# Patient Record
Sex: Female | Born: 1935 | Race: White | Hispanic: No | Smoking: Never smoker
Health system: Southern US, Community
[De-identification: ages and names within clinical notes are randomized; demographics above are authoritative.]

## PROBLEM LIST (undated history)

## (undated) DIAGNOSIS — K219 Gastro-esophageal reflux disease without esophagitis: Secondary | ICD-10-CM

## (undated) DIAGNOSIS — I639 Cerebral infarction, unspecified: Secondary | ICD-10-CM

## (undated) DIAGNOSIS — E119 Type 2 diabetes mellitus without complications: Secondary | ICD-10-CM

## (undated) DIAGNOSIS — I10 Essential (primary) hypertension: Secondary | ICD-10-CM

## (undated) DIAGNOSIS — I2510 Atherosclerotic heart disease of native coronary artery without angina pectoris: Secondary | ICD-10-CM

## (undated) DIAGNOSIS — N186 End stage renal disease: Secondary | ICD-10-CM

## (undated) DIAGNOSIS — E7800 Pure hypercholesterolemia, unspecified: Secondary | ICD-10-CM

## (undated) DIAGNOSIS — D638 Anemia in other chronic diseases classified elsewhere: Secondary | ICD-10-CM

## (undated) DIAGNOSIS — I421 Obstructive hypertrophic cardiomyopathy: Secondary | ICD-10-CM

## (undated) DIAGNOSIS — E162 Hypoglycemia, unspecified: Secondary | ICD-10-CM

## (undated) HISTORY — PX: BACK SURGERY: SHX140

## (undated) HISTORY — DX: Atherosclerotic heart disease of native coronary artery without angina pectoris: I25.10

## (undated) HISTORY — DX: Cerebral infarction, unspecified: I63.9

## (undated) HISTORY — PX: FRACTURE SURGERY: SHX138

## (undated) HISTORY — PX: TOTAL KNEE ARTHROPLASTY: SHX125

## (undated) HISTORY — DX: Pure hypercholesterolemia, unspecified: E78.00

## (undated) HISTORY — PX: APPENDECTOMY: SHX54

---

## 2005-04-30 ENCOUNTER — Inpatient Hospital Stay (HOSPITAL_COMMUNITY): Admission: RE | Admit: 2005-04-30 | Discharge: 2005-05-05 | Payer: Self-pay | Admitting: Orthopedic Surgery

## 2005-05-05 ENCOUNTER — Ambulatory Visit: Payer: Self-pay | Admitting: Physical Medicine & Rehabilitation

## 2005-05-05 ENCOUNTER — Inpatient Hospital Stay
Admission: RE | Admit: 2005-05-05 | Discharge: 2005-05-10 | Payer: Self-pay | Admitting: Physical Medicine & Rehabilitation

## 2006-11-28 ENCOUNTER — Ambulatory Visit: Payer: Self-pay | Admitting: Physical Medicine & Rehabilitation

## 2006-11-30 ENCOUNTER — Ambulatory Visit: Admission: RE | Admit: 2006-11-30 | Discharge: 2006-11-30 | Payer: Self-pay | Admitting: Orthopedic Surgery

## 2006-12-08 ENCOUNTER — Ambulatory Visit: Payer: Self-pay | Admitting: Cardiology

## 2006-12-08 LAB — CONVERTED CEMR LAB
Basophils Relative: 0.5 % (ref 0.0–1.0)
CO2: 25 meq/L (ref 19–32)
Chloride: 109 meq/L (ref 96–112)
Creatinine, Ser: 2 mg/dL — ABNORMAL HIGH (ref 0.4–1.2)
GFR calc non Af Amer: 26 mL/min
Glucose, Bld: 81 mg/dL (ref 70–99)
HCT: 30.9 % — ABNORMAL LOW (ref 36.0–46.0)
INR: 1 (ref 0.9–2.0)
Lymphocytes Relative: 29.9 % (ref 12.0–46.0)
Monocytes Absolute: 0.6 10*3/uL (ref 0.2–0.7)
Monocytes Relative: 6.7 % (ref 3.0–11.0)
Neutrophils Relative %: 60.3 % (ref 43.0–77.0)
Platelets: 256 10*3/uL (ref 150–400)
Prothrombin Time: 12 s (ref 10.0–14.0)
RDW: 13 % (ref 11.5–14.6)
WBC: 8.5 10*3/uL (ref 4.5–10.5)
aPTT: 34.5 s (ref 26.5–36.5)

## 2006-12-16 ENCOUNTER — Inpatient Hospital Stay (HOSPITAL_COMMUNITY): Admission: AD | Admit: 2006-12-16 | Discharge: 2006-12-18 | Payer: Self-pay | Admitting: Cardiology

## 2006-12-16 ENCOUNTER — Ambulatory Visit: Payer: Self-pay | Admitting: Cardiology

## 2007-01-15 ENCOUNTER — Ambulatory Visit: Payer: Self-pay | Admitting: Cardiology

## 2007-01-27 ENCOUNTER — Inpatient Hospital Stay (HOSPITAL_COMMUNITY): Admission: RE | Admit: 2007-01-27 | Discharge: 2007-02-01 | Payer: Self-pay | Admitting: Orthopedic Surgery

## 2009-02-13 ENCOUNTER — Encounter: Admission: RE | Admit: 2009-02-13 | Discharge: 2009-02-13 | Payer: Self-pay | Admitting: Orthopedic Surgery

## 2009-03-19 DIAGNOSIS — I10 Essential (primary) hypertension: Secondary | ICD-10-CM

## 2009-03-19 DIAGNOSIS — E1165 Type 2 diabetes mellitus with hyperglycemia: Secondary | ICD-10-CM

## 2009-03-19 DIAGNOSIS — D539 Nutritional anemia, unspecified: Secondary | ICD-10-CM

## 2009-03-19 DIAGNOSIS — E7800 Pure hypercholesterolemia, unspecified: Secondary | ICD-10-CM

## 2009-03-19 DIAGNOSIS — IMO0002 Reserved for concepts with insufficient information to code with codable children: Secondary | ICD-10-CM | POA: Insufficient documentation

## 2009-03-19 DIAGNOSIS — N259 Disorder resulting from impaired renal tubular function, unspecified: Secondary | ICD-10-CM | POA: Insufficient documentation

## 2009-03-19 DIAGNOSIS — E1129 Type 2 diabetes mellitus with other diabetic kidney complication: Secondary | ICD-10-CM

## 2009-03-20 ENCOUNTER — Ambulatory Visit: Payer: Self-pay | Admitting: Cardiology

## 2009-03-20 DIAGNOSIS — R9431 Abnormal electrocardiogram [ECG] [EKG]: Secondary | ICD-10-CM | POA: Insufficient documentation

## 2009-03-20 DIAGNOSIS — E669 Obesity, unspecified: Secondary | ICD-10-CM

## 2009-03-29 ENCOUNTER — Telehealth: Payer: Self-pay | Admitting: Cardiology

## 2009-04-11 ENCOUNTER — Inpatient Hospital Stay (HOSPITAL_COMMUNITY): Admission: RE | Admit: 2009-04-11 | Discharge: 2009-04-15 | Payer: Self-pay | Admitting: Orthopedic Surgery

## 2010-12-29 LAB — GLUCOSE, CAPILLARY
Glucose-Capillary: 131 mg/dL — ABNORMAL HIGH (ref 70–99)
Glucose-Capillary: 144 mg/dL — ABNORMAL HIGH (ref 70–99)
Glucose-Capillary: 154 mg/dL — ABNORMAL HIGH (ref 70–99)
Glucose-Capillary: 158 mg/dL — ABNORMAL HIGH (ref 70–99)
Glucose-Capillary: 164 mg/dL — ABNORMAL HIGH (ref 70–99)
Glucose-Capillary: 188 mg/dL — ABNORMAL HIGH (ref 70–99)
Glucose-Capillary: 197 mg/dL — ABNORMAL HIGH (ref 70–99)
Glucose-Capillary: 201 mg/dL — ABNORMAL HIGH (ref 70–99)
Glucose-Capillary: 233 mg/dL — ABNORMAL HIGH (ref 70–99)
Glucose-Capillary: 54 mg/dL — ABNORMAL LOW (ref 70–99)
Glucose-Capillary: 59 mg/dL — ABNORMAL LOW (ref 70–99)
Glucose-Capillary: 92 mg/dL (ref 70–99)

## 2010-12-29 LAB — COMPREHENSIVE METABOLIC PANEL
ALT: 10 U/L (ref 0–35)
AST: 19 U/L (ref 0–37)
AST: 28 U/L (ref 0–37)
CO2: 24 mEq/L (ref 19–32)
CO2: 24 mEq/L (ref 19–32)
Chloride: 108 mEq/L (ref 96–112)
Creatinine, Ser: 1.72 mg/dL — ABNORMAL HIGH (ref 0.4–1.2)
Creatinine, Ser: 2.29 mg/dL — ABNORMAL HIGH (ref 0.4–1.2)
GFR calc Af Amer: 25 mL/min — ABNORMAL LOW (ref >60)
GFR calc Af Amer: 34 mL/min — ABNORMAL LOW (ref >60)
GFR calc non Af Amer: 28 mL/min — ABNORMAL LOW (ref >60)
Glucose, Bld: 194 mg/dL — ABNORMAL HIGH (ref 70–99)
Glucose, Bld: 289 mg/dL — ABNORMAL HIGH (ref 70–99)
Sodium: 141 mEq/L (ref 135–145)
Total Bilirubin: 0.5 mg/dL (ref 0.3–1.2)
Total Protein: 6.3 g/dL (ref 6.0–8.3)

## 2010-12-29 LAB — TYPE AND SCREEN
ABO/RH(D): A POS
Antibody Screen: NEGATIVE

## 2010-12-29 LAB — DIFFERENTIAL
Eosinophils Absolute: 0.2 10*3/uL (ref 0.0–0.7)
Lymphocytes Relative: 27 % (ref 12–46)
Lymphs Abs: 1.7 10*3/uL (ref 0.7–4.0)
Monocytes Absolute: 0.4 10*3/uL (ref 0.1–1.0)
Monocytes Relative: 7 % (ref 3–12)
Neutro Abs: 3.9 10*3/uL (ref 1.7–7.7)
Neutrophils Relative %: 62 % (ref 43–77)

## 2010-12-29 LAB — BASIC METABOLIC PANEL
BUN: 42 mg/dL — ABNORMAL HIGH (ref 6–23)
CO2: 21 mEq/L (ref 19–32)
Chloride: 112 mEq/L (ref 96–112)
GFR calc Af Amer: 29 mL/min — ABNORMAL LOW (ref >60)
GFR calc non Af Amer: 24 mL/min — ABNORMAL LOW (ref >60)
Glucose, Bld: 235 mg/dL — ABNORMAL HIGH (ref 70–99)
Potassium: 5.4 mEq/L — ABNORMAL HIGH (ref 3.5–5.1)
Sodium: 136 mEq/L (ref 135–145)

## 2010-12-29 LAB — URINALYSIS, ROUTINE W REFLEX MICROSCOPIC
Glucose, UA: 100 mg/dL — AB
Hgb urine dipstick: NEGATIVE
Nitrite: NEGATIVE
Specific Gravity, Urine: 1.014 (ref 1.005–1.030)
Urobilinogen, UA: 0.2 mg/dL (ref 0.0–1.0)

## 2010-12-29 LAB — CBC
Hemoglobin: 8.9 g/dL — ABNORMAL LOW (ref 12.0–15.0)
MCV: 94.2 fL (ref 78.0–100.0)
RBC: 2.98 MIL/uL — ABNORMAL LOW (ref 3.87–5.11)
RBC: 3.5 MIL/uL — ABNORMAL LOW (ref 3.87–5.11)
RDW: 15 % (ref 11.5–15.5)
WBC: 8.1 10*3/uL (ref 4.0–10.5)

## 2010-12-29 LAB — PROTIME-INR: INR: 1 (ref 0.00–1.49)

## 2011-02-04 NOTE — Op Note (Signed)
NAME:  [name] [name], [name] [name]              ACCOUNT NO.:  [id number]   MEDICAL RECORD NO.:  [id number]          PATIENT TYPE:  INP   LOCATION:  0007                         FACILITY:  [facility]   PHYSICIAN:  Georges Lynch. Gioffre, M.D.DATE OF BIRTH:  01/04/1926   DATE OF PROCEDURE:  04/11/2009  DATE OF DISCHARGE:                               OPERATIVE REPORT   SURGEON:  Georges Lynch. Darrelyn Hillock, M.D.   ASSISTANT:  Dr. Marlowe Kays.   PREOPERATIVE DIAGNOSES:  1. Spinal stenosis at L3-4.  2. Spinal stenosis at L4-5.  3. Herniated disk at L4-5 on the left.   POSTOPERATIVE DIAGNOSES:  1. Spinal stenosis at L3-4.  2. Spinal stenosis at L4-5.  3. Herniated disk at L4-5 on the left.   OPERATION:  1. Decompressive lumbar laminectomy at L3-4 for spinal stenosis.  2. Decompressive lumbar laminectomy at L4-5 for spinal stenosis.  3. Microdiskectomy at L4-5 on the left.   PROCEDURE:  Under general anesthesia, routine orthopedic prep and  draping of the lower back was carried out.  She was delivered 2 grams IV  Ancef preop.  At this time, two needles were placed in the back for  localization purposes and an x-ray was taken.  Following that, an  incision was made over the L3-4, L4-5 spaces.  Bleeders were identified  and cauterized.  We then inserted self-retaining retractors.  The muscle  was stripped from the lamina and spinous processes bilaterally.  Another  x-ray was taken to verify the position.  At this particular time, we  then inserted the [facility] retractors.  We identified the L3 and the  L4 spinous processes and began our central decompressive lumbar  laminectomies.  We went down to L4-5, decided to work our way proximally  because of the type of obstruction.  We went down and did our central  decompressive lumbar laminectomy at L3-4 first.  We now decompressed  lateral recesses.  Following that, we proceeded distally down to L4-5.  She had marked thickening of the ligamentum flavum all along  the way.  It appeared though that the L4-5 was the major obstruction.  A  microscope was used.  Another x-ray was taken to verify our position  that we were at L3-4 and L4-5.  Following that, we continued our  dissection distally, removed the ligamentum flavum and decompressed the  lateral recesses and did foraminotomies bilaterally at both levels.   Following that, we went down and identified the L5 root because she had  the foot drop there on the left and noticed a herniated disk.  The root  was gently retracted and  cauterized lateral recess veins with a  bipolar.  Following that, we made a cruciate incision in the posterior  longitudinal ligament and did a microdiskectomy.  When the procedure was  completed, we were able to easily pass a hockey-stick proximally and  distally without any problems.  The canal was opened proximally and  distally, as well.   We thoroughly irrigated out the area and then I injected 10 mL of  FloSeal into the wound site and then loosely applied some  thrombin-  soaked Gelfoam out laterally.  The wound was closed in layers in usual  fashion.  I did leave a small, deep proximal and distal part of the  wounds open for drainage purposes.  The subcu was closed with 0 Vicryl,  skin with metal staples.  A sterile Neosporin dressing was applied.  The  patient left the operating room in satisfactory condition.           ______________________________  Georges Lynch Darrelyn Hillock, M.D.     RAG/MEDQ  D:  04/11/2009  T:  04/11/2009  Job:  [id number]   cc:   Rollene Rotunda, MD, [facility]  [address]  [address]  [place]  [place] [address]

## 2011-02-04 NOTE — H&P (Signed)
NAME:  [name] [name], [name] [name]              ACCOUNT NO.:  [id number]   MEDICAL RECORD NO.:  [id number]          PATIENT TYPE:  INP   LOCATION:  NA                           FACILITY:  [facility]   PHYSICIAN:  Georges Lynch. Gioffre, M.D.DATE OF BIRTH:  29-Apr-1926   DATE OF ADMISSION:  DATE OF DISCHARGE:                              HISTORY & PHYSICAL   CHIEF COMPLAINT:  Left hip and knee pain.   HISTORY OF PRESENT ILLNESS:  Ms. Single is an 77 year old female who has  been followed by Dr. Darrelyn Hillock for worsening lower extremity pain which is  worse on the left side.  The patient had a CT myelogram of her lumbar  spine revealing spinal stenosis at L4 and L5.   ALLERGIES:  No known drug allergies.  No reaction to anesthesia.   MEDICATIONS:  Insulin, Benicar, carvedilol, amitriptyline, simvastatin,  Osteo Bi-Flex, chondroitin, ginger root, garlic, fish oil.   PAST MEDICAL HISTORY:  1. Hypertension.  2. Diabetes.  3. Osteoarthritis.   PAST SURGICAL HISTORY:  1. Appendectomy.  2. Bilateral shoulder manipulation.  3. Right total knee arthroplasty.  4. Left total knee arthroplasty.   FAMILY MEDICAL HISTORY:  The patient's mother deceased in her 75s of a  CVA.  The patient's father deceased in his 66s of a heart attack.   SOCIAL HISTORY:  The patient's husband passed away 3 years ago.  She is  a widow.  She lives at her daughter's house.  The patient is a retired  Public librarian.  She is a nonsmoker, nondrinker.  The patient does  not have any stairs in the house that she has to go up, and her daughter  will be her caregiver after surgery.   REVIEW OF SYSTEMS:  GENERAL:  Negative for fever.  Negative for weight  loss.  HEENT:  Negative for dentures or removable dental work.  CARDIOVASCULAR:  Negative for chest pain, negative for palpitations.  RESPIRATORY:  Negative for shortness of breath.  Negative for wheezes.  GI:  Negative for blood in stool.  GU:  Negative for dysuria, negative  for  frequent UTIs.  MUSCULOSKELETAL:  Positive for low back pain.  Positive for left hip pain.  HEMATOLOGY:  Negative for easy  bruising/easy bleeding.   PHYSICAL EXAMINATION:  VITAL SIGNS:  Pulse 64, respirations 18, blood  pressure 150/65.  GENERAL:  The patient is alert and oriented x3, in no acute distress.  She is a good historian.  HEENT:  Unremarkable.  NECK:  Supple, full range of motion, no lymphadenopathy.  HEART:  Regular rate and rhythm with distant heart sounds without  murmur.  ABDOMEN:  Soft, nontender.  Bowel sounds are present.  EXTREMITIES:  Positive for weakness with the patient's left great toe  extensors.  Sensory exam of lower extremities is intact bilaterally.  SKIN:  Unremarkable.  PERIPHERAL VASCULAR:  Negative for carotid bruit.  Carotid pulses are 2+  bilaterally.  Dorsalis pedis pulses are 2+ bilaterally.   X-RAY:  CT myelogram of the patient's lumbar spine revealed spinal  stenosis at L4 and L5.   IMPRESSION:  Lumbar spinal stenosis.  PLAN:  The patient is to undergo central decompression of L4-L5 on April 11, 2009 by Dr. Darrelyn Hillock.  The patient has been examined and cleared for  surgery by Dr. Antoine Poche, her cardiologist.      Rozell Searing, PAC    ______________________________  Georges Lynch. Darrelyn Hillock, M.D.    LD/MEDQ  D:  04/05/2009  T:  04/05/2009  Job:  [id number]

## 2011-02-07 NOTE — Cardiovascular Report (Signed)
NAME:  [name] [name], [name] [name] NO.:  [id number]   MEDICAL RECORD NO.:  [id number]          PATIENT TYPE:  INP   LOCATION:  2014                         FACILITY:  [facility]   PHYSICIAN:  Rollene Rotunda, MD, FACCDATE OF BIRTH:  [birth year]   DATE OF PROCEDURE:  12/17/2006  DATE OF DISCHARGE:                            CARDIAC CATHETERIZATION   PROCEDURE:  Left and heart catheterization/coronary arteriography.   PRIMARY:  Dr. Lucila Maine   INDICATION:  Evaluate patient with an abnormal Cardiolite suggesting  apical and lateral wall ischemia.  She is preoperative for a knee  replacement.  She has multiple cardiovascular risk factors.   PROCEDURE NOTE:  Left heart catheterization is performed via the right  femoral artery.  The artery was cannulated using anterior wall puncture.  A #6 French arterial sheath was inserted via the modified Seldinger  technique.  Preformed Judkins and a pigtail catheter were utilized.  Of  note, the patient was hydrated overnight prior to this procedure because  of a creatinine of 1.9.  We used minimal dye as well.  She tolerated the  procedure well and left the lab in stable condition.   RESULTS:   HEMODYNAMICS:  LV 179/27, 180/99.  Coronaries:  Left main was normal.  The LAD had mid calcification.  There was a long mid 30% stenosis  involving a second diagonal.  The LAD was large and wrapping the apex.  First diagonal was small with ostial 25% stenosis.  Second diagonal was  large with ostial 40% stenosis.  Circumflex in the A-V groove was  normal.  OM 1 was small and normal.  OM 2 was large and normal.  OM 3  and 4 were very small vessels and normal.  The right coronary artery was  a large dominant vessel.  There was a proximal 25% stenosis.  The PA was  large with ostial 30% stenosis.  The posterolateral was large and  normal.  The left ventricle was not injected secondary to renal  insufficiency.  We did cross her pressures.   CONCLUSION:   Nonobstructive coronary artery disease.   PLAN:  The patient will have no further cardiac workup.  She will be  kept in the hospital because of her hypertension which required  labetalol during this procedure.  Also, we will want to hydrate her and  follow her kidney function.  We will also check a CBC in the morning, as  she is anemic.      Rollene Rotunda, MD, [facility]  Electronically Signed    JH/MEDQ  D:  12/17/2006  T:  12/17/2006  Job:  [id number]   cc:   Georges Lynch. Darrelyn Hillock, M.D.  Lucila Maine, MD

## 2011-02-07 NOTE — H&P (Signed)
NAME:  [name] [name], MACARI              ACCOUNT NO.:  [id number]   MEDICAL RECORD NO.:  [id number]         PATIENT TYPE:  LINP   LOCATION:                               FACILITY[facility]  [facility]   PHYSICIAN:  Georges Lynch. Gioffre, M.D.DATE OF BIRTH:  [birth year]   DATE OF ADMISSION:  12/04/2006  DATE OF DISCHARGE:                              HISTORY & PHYSICAL   CHIEF COMPLAINT:  Painful range of motion [name] knee.   HISTORY OF PRESENT ILLNESS:  The patient is a 77 year old female who has  been evaluated for by Dr. Darrelyn [name], for bad knee pain on the [name].  She  does have documented osteoarthritis in the knee with end-stage arthritic  changes.  The patient has failed conservative treatment.  The patient  elects to proceed with the [name] total knee arthroplasty.  She has had  one on the left in 2006 with good results.   ALLERGIES:  NO KNOWN DRUG ALLERGIES.   PAST MEDICAL HISTORY:  Includes diabetes, hypertension,  hypercholesterolemia, recent renal insufficiency.   CURRENT MEDICATIONS:  Include Humulin insulin 70/30 28 units in the  a.m., 18 units in the p.m., simvastatin 40 mg a day, amitriptyline 10 mg  a day, Vicodin p.r.n., amlodipine/benazepril 5/20 mg once a day, Benicar  40 mg a day started on February 25, multiple organic supplements.   PAST SURGICAL HISTORY:  Includes a left total knee arthroplasty in 2006  without any complications.   REVIEW OF SYSTEMS:  Negative for any neurologic issues.  She does use  reading glasses.  PULMONARY:  Unremarkable.  CARDIOVASCULAR:  She does  have some hypertension which is being currently adjusted and new  medications added.  She is also having a cardiac workup which she is  going to complete this coming Wednesday.  She does have occasional acid  reflux which she uses over-the-counter.  No other GI or GU complaints  other than some increased urinary frequency.  ENDOCRINE:  She does have  diabetes which she is currently under treatment by Dr. Katherina [name]  down in  [place].  HEMATOLOGIC:  She has had one blood transfusion and that was  with her left total knee arthroplasty.   FAMILY MEDICAL HISTORY:  Father is deceased from complications of an MI  in 45. Mother is deceased from complications of stroke in 33.   SOCIAL HISTORY:  The patient is a widow.  She is retired.  She lives  with her daughter who does work.  She denies any smoking or alcohol use.   PRIMARY CARE PHYSICIAN:  Dr. Lucila Maine.   ENDOCRINOLOGIST:  Dr. Katherina [name].   PHYSICAL EXAM:  VITAL SIGNS:  Height is 5 feet 2 inches, weight is 200  pounds, blood pressure initially is 190/88, rechecked after resting on  the exam table ten minutes later was 170/77, pulse of 74 and regular,  respirations 12, patient is afebrile.  GENERAL:  This is short-statured, heavy-set female, conscious, alert and  appropriate.  She does walk with a significant left-sided limp.  She  does require assistance getting on and off the exam table.  HEENT: Head was normocephalic.  Pupils equal, round, reactive.  Extraocular movements intact.  Sclerae is not icteric.  Oral buccal  mucosa is pink and moist.  Gross hearing is intact.  NECK:  Neck was supple.  No palpable lymphadenopathy.  She had good  range of motion of her cervical spine without any difficulty or  tenderness.  UPPER EXTREMITIES:  Upper extremities were symmetrically sized and  shape.  She had good range of motion of shoulders, elbows, wrists.  Motor strength 5/5.  CHEST:  Lung sounds were clear and equal bilaterally.  No wheezes,  rales, rhonchi.  HEART:  She had a 2 to 3 systolic murmur throughout the left precordium  up into the left side of the neck.  ABDOMEN:  Abdomen was round, obese, soft.  Bowel sounds present.  LOWER EXTREMITIES:  [name] and left hip was able to fully extend and flex  up to 100 degrees with 20 degrees internal-external rotation without any  discomfort.  [name] knee had no signs of erythema or ecchymosis.  No   effusion.  She was able to easily extended it to about 15 degrees short  of full extension and slowly extend it the rest of the way.  She can  flex it back to 120 degrees.  She had no instability with valgus or  varus stressing or AP draw.  The calf was soft and nontender.  Left knee  had a well-healed midline surgical incision.  She was able to fully  extend.  She could flex it back to about 95 degrees with no instability  with valgus or varus stressing.  She did have some quad muscle  discomfort with flexion all the way back.  Calf was soft and nontender.  The ankles were symmetrical with good dorsi plantar flexion.  NEUROLOGIC:  The patient was grossly neurologically intact to light  touch and sensation throughout.  She had no gross neurologic defects  with the exception she did have a slight resting tremor of her upper  extremities.  PERIPHERAL VASCULAR:  Carotid pulses were 2+, no bruits.  Radial pulses  were 2+.  Dorsalis pedis pulses were very trace.  She had good capillary  refill on her toes.  She did have compression stockings on.  BREASTS, RECTAL AND GU EXAMS:  Deferred at this time.   IMPRESSION:  1. End-stage osteoarthritis [name] knee with planned [name] total knee      arthroplasty.  2. Hypertension which is loosely controlled but current had addition      of medications 5 days previous.  3. Current cardiac workup with stress test this Wednesday.  4. Occasional reflux well controlled with over-the-counter      medications.  5. Insulin dependent diabetes.  6. Obesity.   PLAN:  The patient will have a [name] total knee arthroplasty with Dr.  Darrelyn [name] at [facility] on December 04, 2006.  The patient will  undergo all routine labs and tests.  The patient is currently getting a  cardiac workup by her primary care physician which will be completed on  March 5.  The patient's blood pressure was slightly elevated but she recently has some Benicar added so we will watch  this closely.  The  patient may be a candidate postop for a skilled nursing facility as she  does live with her daughter who is now working who will be providing  care for her throughout the day.      Jamelle Rushing, P.A.    ______________________________  Georges Lynch  [name] [name], M.D.    RWK/MEDQ  D:  11/23/2006  T:  11/23/2006  Job:  [id number]

## 2011-02-07 NOTE — Discharge Summary (Signed)
NAME:  [name] [name], [name] [name]              ACCOUNT NO.:  [id number]   MEDICAL RECORD NO.:  [id number]          PATIENT TYPE:  INP   LOCATION:  2014                         FACILITY:  [facility]   PHYSICIAN:  Rollene Rotunda, MD, FACCDATE OF BIRTH:  October 17, 1925   DATE OF ADMISSION:  12/16/2006  DATE OF DISCHARGE:  12/18/2006                               DISCHARGE SUMMARY   PRIMARY CARDIOLOGIST:  Rollene Rotunda, MD, [facility].   PRIMARY CARE PHYSICIAN:  Dr. Lucila Maine.   PROCEDURES PERFORMED DURING HOSPITALIZATION:  Cardiac catheterization  performed by Dr. Rollene Rotunda on December 17, 2006, with conclusion of  nonobstructive coronary artery disease.   DISCHARGE DIAGNOSES:  1. Noncardiac chest pain.  2. Preoperative evaluation prior to total knee replacement.  3. Abnormal EKG.   SECONDARY DIAGNOSES:  1. Hyperlipidemia.  2. Hypertension.  3. Degenerative joint disease.  4. Anemia.   HISTORY OF PRESENT ILLNESS:  This is a 77 year old Caucasian female  without prior cardiac history who is being evaluated for total knee  surgery preoperatively.  The patient had an abnormal EKG revealing  lateral deep T wave inversion apparently new from previous EKGs.  The  patient also had an echocardiogram which demonstrated some diastolic  dysfunction by Doppler parameters with mild left ventricular hypertrophy  and asymmetric septal hypertrophy.  Her EF at that time was 65%.  The  stress test was abnormal revealing a small area of apical and lateral  wall ischemic along with hypokinesis of the apical inferior lateral wall  as well.  The patient was admitted for cardiac catheterization.   Cardiac catheterization was completed as above without evidence of  obstructive coronary artery disease.  The patient was kept overnight  secondary to evidence of anemia with a hemoglobin of 8.8.  The patient  was hydrated secondary to elevated BUN and creatinine after  catheterization with a BUN of 35 and a creatinine of  1.8.   The patient did well post cardiac catheterization without evidence of  hematoma, bleeding, or infection at the right groin site.  After  hydration, the patient's BUN and creatinine improved.  Also, followup  labs were completed for hemoglobin and hematocrit on the day of  discharge with a hemoglobin of 8.8.  The patient had repeat hemoglobin  and hematocrit several hours later on the day of discharge, and it was  found to be at 9.3 and 28.1.  Secondary to this, the patient was found  to be stable for discharge and had been seen and examined by Dr.  Antoine Poche prior to discharge that a.m.   TOTAL LABORATORY DATA ON DISCHARGE:  Hemoglobin 9.3, hematocrit 28.1,  white blood cells 8.8, platelets 215.  Sodium 142, potassium 4.6,  chloride 112, CO2 23, glucose 85, BUN 31, creatinine 1.87, LFTs within  normal limits.  Calcium 8.9.  Troponin 0.05, 0.05, respectively.  Cholesterol 114, triglycerides 80, HDL 37, LDL 61, TSH 1.890.  Fecal  occult blood was negative.   VITAL SIGNS ON DISCHARGE:  Blood pressure 152/61, plus 59, respirations  20, temperature 98.6, O2 saturation 93% on room air.  The patient's  weight was  90.5 kg.   FOLLOWUP PLANS AND APPOINTMENTS:  1. The patient is to follow up with Dr. Rollene Rotunda on January 15, 2007, at 3:30 p.m. for post discharge evaluation.  2. The patient is to follow up with her primary care physician so      calls can be made by patient after going home.  Followup plans for      a knee replacement will be discussed with her primary care      physician and orthopedic surgeon.   DISCHARGE MEDICATIONS:  1. Zocor 40 mg at bedtime.  2. Benicar 40 mg daily.  3. Elavil 10 mg daily.  4. NovoLog insulin 70/30 28 units in the a.m. 18 units in the p.m.  5. Aspirin 324 mg daily.  6. Metoprolol 50 mg daily.   ALLERGIES:  No known drug allergies.   TIMES SPENT WITH THE PATIENT TO INCLUDE PHYSICIAN TIME:  Thirty-five  minutes.      [name] [name].  [name] Bishop, NP      Rollene Rotunda, MD, [facility]  Electronically Signed    KML/MEDQ  D:  12/18/2006  T:  12/19/2006  Job:  [id number]   cc:   Lucila Maine, M.D.

## 2011-02-07 NOTE — H&P (Signed)
NAME:  [name] [name], OTHMAN              ACCOUNT NO.:  [id number]   MEDICAL RECORD NO.:  [id number]          PATIENT TYPE:  INP   LOCATION:  NA                           [facility]   PHYSICIAN:  Georges Lynch. Gioffre, M.D.DATE OF BIRTH:  05/28/1926   DATE OF ADMISSION:  04/30/2005  DATE OF DISCHARGE:                                HISTORY & PHYSICAL   HISTORY OF PRESENT ILLNESS:  The patient comes in with left knee pain.  She  has had left knee pain for at least a year and has been increasing with  activity.  She has had no relief with nonsteroidal antiinflammatory  medications or the Synvisc series.  She has also had multiple cortisone  injections without relief and the patient elects to proceed with left total  knee arthroplasty.   ALLERGIES:  No known drug allergies.   PRIMARY CARE PHYSICIAN:  Dr. Lorin Picket in [place].   PAST MEDICAL HISTORY:  1.  Insulin-dependent diabetes.  2.  Hypertension.  3.  High cholesterol.  4.  Osteoporosis.  5.  Degenerative arthritis.   FAMILY HISTORY:  Sister with breast cancer.   CURRENT MEDICATIONS:  1.  Insulin 70/30 26 units q.a.m. and 12 units q.p.m.  2.  Starlix 120 mg t.i.d.  3.  Lescol 80 mg daily.  4.  Diovan/HCT 80/12.5 mg daily.  5.  Amitriptyline 10 mg daily.  6.  Actonel 35 mg weekly.   PAST SURGICAL HISTORY:  1.  Appendectomy years ago.  2.  Manipulation of both shoulders, the left in 1991, the right in 1995.  3.  Cataract surgery in right eye in 1984, left eye 1991.   REVIEW OF SYSTEMS:  CONSTITUTIONAL:  Denies weight change, fever, chills,  fatigue.  HEENT:  Denies headache, visual changes, sore throat.  CARDIOVASCULAR:  Denies chest pain, palpitations, shortness of breath or  orthopnea.  PULMONARY:  Denies dyspnea, wheezing, cough, sputum production,  hemoptysis.  GASTROINTESTINAL:  Denies nausea, vomiting, hematemesis or  abdominal pain.  GENITOURINARY:  Denies dysuria, frequency, urgency or  hematuria.  ENDOCRINE:  Denies  polyuria, polydipsia, appetite change due to  cold intolerance.  MUSCULOSKELETAL:  The patient has left knee pain.  NEUROLOGIC:  Denies dizziness, vertigo, syncope, seizures.  SKIN:  Denies  itching, rashes, masses or moles.   PHYSICAL EXAMINATION:  VITAL SIGNS:  Temperature 98.6, pulse 72,  respirations 18, blood pressure 140/70.  GENERAL:  A 77 year old female in no acute distress.  HEENT:  PERRL.  EOMs intact.  Pharynx clear.  NECK:  Supple.  CHEST:  Clear to auscultation bilaterally, no wheezing, rales or rhonchi  noted.  HEART:  Regular rate and rhythm without murmur.  ABDOMEN:  Positive bowel sounds, soft, nontender.  EXTREMITIES:  Examination of her left knee reveals a genu varus deformity,  painful range of motion of left knee, minimal effusions.  SKIN:  Warm and dry.   LABORATORY DATA AND X-RAY FINDINGS:  X-rays show complete collapse of the  medial joint, left knee.   IMPRESSION:  Degenerative arthritis, left knee.   PLAN:  The patient is to be admitted to  [facility] on April 30, 2005, to undergo a left total knee arthroplasty.      Haynes Hoehn  D:  04/28/2005  T:  04/28/2005  Job:  [id number]

## 2011-02-07 NOTE — H&P (Signed)
NAME:  [name] [name], [name] [name]              ACCOUNT NO.:  [id number]   MEDICAL RECORD NO.:  [id number]          PATIENT TYPE:  INP   LOCATION:  NA                           FACILITY:  [facility]   PHYSICIAN:  Georges Lynch. Gioffre, M.D.DATE OF BIRTH:  10/16/1925   DATE OF ADMISSION:  01/27/2007  DATE OF DISCHARGE:                              HISTORY & PHYSICAL   CHIEF COMPLAINT:  Painful range of motion right knee.   HISTORY OF PRESENT ILLNESS:  The patient is 77 year old female here  today for her admissions history and physical, for her right total knee  arthroplasty which was initially scheduled for December 04, 2006, but  during her preoperative evaluation she was felt to need a cardiac  evaluation, and so her surgery had to be cancelled.  The patient has  subsequently had a cardiac workup completed, and she has been cleared by  her primary care physician for this right total knee arthroplasty.  The  patient has end-stage osteoarthritic changes of her right knee.  She has  failed conservative treatment and would like to proceed with a total  knee arthroplasty.   ALLERGIES:  NO KNOWN DRUG ALLERGIES.   CURRENT MEDICATIONS:  Include:  1. Alprazolam 0.5 mg p.r.n.  2. Aspirin 324 mg p.o. daily.  3. Metoprolol 50 mg once a day.  4. Simvastatin 40 mg once a day.  5. Amlodipine/benazepril 05/20 mg once a day.  6. Benicar 40 mg once a day.  7. Humulin insulin 70/30, 28 units in the a.m., 18 units p.m.  8. Amitriptyline 10 mg p.o. daily.   PAST MEDICAL HISTORY:  Includes:  1. Diabetes.  2. Hypertension.  3. Hypercholesterolemia.  4. Mild to moderate renal insufficiency.  5. Chronic anemia due to chronic disease.   PAST SURGICAL HISTORY:  Includes left total knee arthroplasty in 2006  without any anesthetic complications.   PRIMARY CARE PHYSICIAN:  Dr. Lucila Maine.   ENDOCRINOLOGIST:  Dr. Lawson Radar.   REVIEW OF SYSTEMS:  Negative for any neurologic issues.  She is just  using reading glasses.   No respiratory problems.  She does have some  hypertension, which is currently being adjusted with new medications.  She denies any cardiac issues.  She recently had a cardiac workup.  She  does have occasional problems with acid reflux and uses over-the-counter  medicines no GI, GU problems.  Diabetes is fairly well-controlled.  She  does as she did have a blood transfusion with her total knee, and she  has currently got chronic anemia, due to chronic disease, due to her  mild to moderate renal insufficiency.   SOCIAL HISTORY:  The patient is a widow.  She is retired, lives with her  daughter, denies any smoking or alcohol use.   PHYSICAL EXAMINATION:  VITAL SIGNS:  Height is 5' 2,  weight is 200  pounds.  Blood pressure was 138/48, pulse of 72 and regular,  respirations 12, patient is afebrile.  GENERAL:  This is a short-statured healthy-appearing, slightly obese  female.  She does require pushing off of the chair to get in and out of  the chair.  She ambulates with a right-sided limp.  HEENT: Head is normocephalic.  She is pupils equal, round and reactive.  Extraocular movements intact.  Gross hearing is intact.  NECK:  Supple.  No palpable lymphadenopathy.  Good range of motion of  her cervical spine without any difficulty or tenderness.  EXTREMITIES:  Upper extremities were symmetrically good size and shape.  Good range of motion.  Good strength.  CHEST:  Lung sounds were clear and equal bilaterally.  No wheezes, rales  or rhonchi.  HEART:  She had a 2-2/6 systolic murmur heard throughout the left  precordium up into the neck, otherwise regular rate and rhythm.  ABDOMEN:  Soft, nontender.  LOWER EXTREMITIES:  Right and left hip had full extension, flexion up to  about 105 degrees with about 15 degrees internal-external rotation,  which is mild soreness.  Right knee:  No signs of erythema or  ecchymosis.  She could fully extend it, flex it back to 120, no  instability.  Calf was  soft and nontender.  Left knee:  Well-healed  midline surgical incision.  She is able to fully extend it.  She can  flex back to 90 degrees, no instability.  No signs of infection.  Calf  was soft, nontender.  The ankles were symmetrical with good dorsi  plantar flexion.  NEUROLOGIC:  The patient was conscious, alert and appropriate, good  historian.  No gross neurologic defects noted.  PERIPHERAL VASCULAR:  Carotid pulses are 2+, no bruits.  Radial pulses  2+ .  Dorsalis pedis pulses were trace.  She had good capillary refill  on her toes.   Breast, rectal and GU exams were deferred.   IMPRESSIONS:  1. End-stage osteoarthritis, right knee.  2. Hypertension.  3. Recent cardiac clearance with stress test.  4. Mild to moderate renal insufficiency.  5. Chronic anemia due to chronic renal insufficiency.  6. Diabetes mellitus type 2.  7. Occasional reflux disease controlled with over-the-counter      medications.  8. Obesity.   PLAN:  The patient is planned to have a right total knee arthroplasty  with Dr. Ciro Backer at [facility] on Jan 27, 2007.  The patient  has now been cleared by cardiology and her primary care physician for  the above-mentioned issues, for this upcoming right total knee  arthroplasty.  The patient will undergo all other routine re-evaluations  with blood work.      Jamelle Rushing, P.A.    ______________________________  Georges Lynch Darrelyn Hillock, M.D.    RWK/MEDQ  D:  01/18/2007  T:  01/18/2007  Job:  [id number]

## 2011-02-07 NOTE — Discharge Summary (Signed)
NAME:  [name] [name], [name] [name]              ACCOUNT NO.:  [id number]   MEDICAL RECORD NO.:  [id number]          [name] TYPE:  INP   LOCATION:  1529                         FACILITY:  [facility]   PHYSICIAN:  Georges Lynch. Gioffre, M.D.DATE OF BIRTH:  09/12/1926   DATE OF ADMISSION:  04/11/2009  DATE OF DISCHARGE:  04/15/2009                               DISCHARGE SUMMARY   ADMISSION DIAGNOSES:  1. Spinal stenosis.  2. Hypertension.  3. Diabetes.  4. Osteoarthritis.   DISCHARGE DIAGNOSES:  1. Spinal stenosis status post decompressive lumbar laminectomy.  2. Hypertension.  3. Diabetes.  4. Osteoarthritis.   HISTORY OF PRESENT ILLNESS:  [name] [name] is an 77 year old female who has  been followed by Dr. Darrelyn Hillock for worsening lower extremity pain which  seems to be worse on her left side.  [name] [name] had a CT myelogram of  her lumbar spine revealing severe spinal stenosis at L4-L5.  As her  worsening back continued to prevent her from doing things, [name] [name]  has opted to have decompressive lumbar laminectomy for her spinal  stenosis.   ALLERGIES:  NO KNOWN DRUG ALLERGIES.  No previous reaction to  anesthesia.   ADMISSION MEDICATIONS:  1. Insulin.  2. Benicar.  3. Carvedilol.  4. Amitriptyline.  5. Simvastatin.  6. Osteo Bi-Flex.  7. Chondroitin.  8. Ginger root.  9. Garlic  10.Fish oil.   SURGICAL PROCEDURES:  On April 11, 2009, [name] [name] was taken to [name]  by Dr. Ranee Gosselin assisted by Dr. Marlowe Kays.  Under general  anesthesia, [name] [name] underwent decompressive lumbar laminectomy at L3-  L4 and L4-L5 for spinal stenosis.  [name] [name] also underwent  microdiskectomy at L4-L5 on [name] left.   CONSULTANTS:  Routine consultations were requested, physical therapy and  occupational therapy.   LABORATORY DATA:  [name] [name]'s pre-admission labs were drawn on April 09, 2009.  [name] [name]'s vital signs were normal.  [name] [name]'s  complete blood count revealed white blood  cell count of 3.5, hemoglobin  of 10.6, hematocrit of 33 and a platelet count 182.  [name] [name]'s  chemistry panel revealed hyperkalemia of 5.9, elevated glucose at 289,  elevated BUN and creatinine at 49 and 2.29.  [name] [name]'s urinalysis  was positive for glucose in her urine, but was otherwise unremarkable.  Throughout [name] [name]'s hospital stay, her vital signs remained stable  and she was afebrile throughout her visit.  [name] [name]'s complete blood  count was checked again on April 13, 2009 showing white blood cell count  of 8.1, hemoglobin of 8.9, hematocrit of 28 and a platelet count of 149.  [name] [name]'s chemistry panel was rechecked on [name] same day showing  elevated glucose of 194, elevated BUN of 24 and 1.72.  Serial glucose  tests were done throughout [name] [name]'s stay as she is a diabetic.  Her  blood sugar became as high as 188 and went as low as 81.  This was  managed with sliding-scale insulin.   HOSPITAL COURSE:  On postoperative day 1, [name] [name] no longer had any  radiating leg pain down her  right or her left leg.  However, we were  concerned because [name] [name]'s BUN and creatinine while they were  coming down were still elevated at 42 and 2.01.  [name] [name]'s K was  also elevated at this time at 5.4.  Physical therapy consulted on [name]  [name] on this day and she was able to ambulate 100 feet with some  assistance.  Physical therapy made a note that [name] [name] would be  requiring home health physical therapy, so social work was consulted to  arrange [place] for her home health.  On postoperative day 3, [name]  [name] was doing quite well.  [name] [name]'s CBC and chem panel were  repeated on postoperative day 2.  Again her BUN and creatinine were  continuing to steadily decrease and by postop day 2, they were 24 and  1.72.  [name] [name]'s potassium had also normalized by this time.  On  postoperative day 3, [name] [name] was again seen by physical therapy and  was able  to ambulate 20 feet two times, however, this time with minimal  rather than moderate assistance.  [name] [name] was able to sit to stand  and stand to sit also with minimal assistance.  [name] [name] was  discharged home on postoperative day 4.   DISCHARGE/PLAN:  [name] [name] is discharged to home.  [facility]  has been arranged to come and take care of [name] physical therapy,  occupational therapy, continue with [name] [name]'s ADLs.  [name] dressing  should continue to be changed on a daily basis.  I discussed with [name]  [name] that when showering for [name] first 3 days, she needs to cover [name]  incision with saran wrap and then put a clean dressing on after  showering.   DIET RESTRICTIONS:  [name] needs to be on an 1800 calorie diabetic  diet.   ACTIVITY:  [name] [name] is to walk with a walker with assistance.  However, she is weightbearing as tolerated.   FOLLOW UP:  [name] [name] has a follow up appointment with Dr. Tinnie Gens 2  weeks from her date of surgery.  She will need to call [phone] to set  up this follow up appointment.   DISCHARGE MEDICATIONS:  1. Percocet 10/650 one tab p.o. q.4-6 h. p.r.n. pain.  2. Robaxin 500 mg one tablet p.o. t.i.d. p.r.n. muscle spasms.  3. Aspirin 325 mg one tablet p.o. b.i.d.  [name] [name] to do this for 2      weeks from [name] date of surgery.  4. Insulin.  5. Benicar.  6. Carvedilol.  7. Amitriptyline.  8. Simvastatin.  9. Osteo Bi-Flex.  10.Chondroitin.  11.Ginger root.  12.Garlic.  13.Fish oil.   CONDITION ON DISCHARGE:  [name] [name]'s condition upon discharge home is  listed as improved.      Rozell Searing, PAC    ______________________________  Georges Lynch Darrelyn Hillock, M.D.    LD/MEDQ  D:  05/12/2009  T:  05/12/2009  Job:  [id number]

## 2011-02-07 NOTE — Op Note (Signed)
NAME:  [name] [name], SCHILD              ACCOUNT NO.:  [id number]   MEDICAL RECORD NO.:  [id number]          PATIENT TYPE:  INP   LOCATION:  0004                         FACILITY:  [facility]   PHYSICIAN:  Georges Lynch. Gioffre, M.D.DATE OF BIRTH:  Nov 24, 1925   DATE OF PROCEDURE:  04/30/2005  DATE OF DISCHARGE:                                 OPERATIVE REPORT   SURGEON:  Georges Lynch. Darrelyn Hillock, M.D.   ASSISTANT:  Ebbie Ridge. Paitsel, P.A.   PREOPERATIVE DIAGNOSIS:  Degenerative arthritis of the left knee.   POSTOPERATIVE DIAGNOSIS:  Degenerative arthritis of the left knee.   OPERATION:  Left total knee arthroplasty utilizing the DePuy rotating  platform system. All three components were cemented. The sizes used were 32  patella, a size 2-1/2 left posterior cruciate sacrificing femoral component,  and a size 2-1/2 tibial tray. All three components were cemented. I used  vancomycin in the cement.   PROCEDURE:  After routine orthopedic prep and draping of the left lower  extremity is carried out, she had 1 gram of IV Ancef preop. Incision was  made in the midline after the tourniquet was elevated at 375 mmHg. We first  exsanguinated the leg with an Esmarch, then elevated the tourniquet. At this  time, incision then was carried down along the medial border of the patella.  We carried out a median parapatellar incision reflecting the patella  laterally, flexed the knee, and did a medial and lateral meniscectomy to  excise the anterior posterior cruciate ligaments. At that time, initial  drill hole was made in the distal femur and a 10 mm thickness was removed  from the distal femur with the intramedullary guide in place after utilizing  intramedullary guide. Following that, we then measured the femur to be a  size 2-1/2 femur. The appropriate cuts were made after the chamfering device  was applied. We carried out anterior posterior chamfering cuts. Following  that, we then prepared the tibia in the usual  fashion for a 2.5 tray. We  removed 4 mm thickness off the affected medial side. We then went back and  removed 2 additional mm of thickness, so a total of 6 mm thickness off the  affected medial side was then removed from the tibial plateau. We then cut  our keel cut out of the tibia in the usual fashion and then prior to doing  this, obviously, we prepared the femur with our notch cut as well. We then  inserted our trial component, took the knee through motion, and had  excellent range of motion. We then removed the trial components after we cut  our patella. We did a resurfacing procedure on the patella with three drill  holes. The patella measured 32 mm. We then thoroughly water picked out the  knee, removed all of the components, and cemented all three components in  simultaneously utilizing vancomycin in the cement. We then removed all loose  pieces of cement. We went through trials again and finally selected a 12.5  mm thickness tibial insert. We then thoroughly irrigated out the knee again  to make sure there  were no loose pieces of cement. We then closed it in  layers in usual fashion over a Hemovac drain. The main part of the wound was  closed in the usual fashion. Skin was closed with metal staples. A sterile  Neosporin dressing was applied. The patient left the operating room in  satisfactory condition.     RAG/MEDQ  D:  04/30/2005  T:  04/30/2005  Job:  [id number]

## 2011-02-07 NOTE — Letter (Signed)
December 18, 2006    [name] [name], M.D.  [facility]  [address]  [address]  [place], [address]   RE:  [name] [name]  MRN:  [id number]  /  DOB:  02/22/1926   Dear Dr. Darrelyn [name]:   This letter is in regards to [name] [name]. This pleasant patient was  referred to me for preoperative clearance. She had an abnormal  Cardiolite with multiple cardiovascular risk factors. I did perform a  cardiac catheterization. These results should have been to sent to you.  If not, they are available on E-chart. She had no obstructive coronary  disease. With this result, she would be at acceptable risk for planned  surgery from a cardiovascular standpoint. She would not need further  cardiovascular testing. I would bring it to your attention that she has  renal insufficiency. This seems to be a new diagnosis and most likely is  related to her long standing diabetes and hypertension. However, I do  not think she has had any evaluation of this as of yet. She was also  anemic. I did make sure in the hospital that this was a stable  hemoglobin and she was guaiac negative. However, there has not yet been  a workup of this either. There were some labs drawn in the hospital for  anemia workup that are pending.   She will going back to see Dr. Lucila [name]. No further cardiovascular  testing is planned. If you have any questions about this encounter,  please do not hesitate to give me a call. My cell phone number is [phone].    Sincerely,      [name] Rotunda, MD, [facility]  Electronically Signed    JH/MedQ  DD: 12/18/2006  DT: 12/19/2006  Job #: [id number]   CC:    [name] [name], M.D.

## 2011-02-07 NOTE — Op Note (Signed)
NAME:  [name] [name], [name] [name]              ACCOUNT NO.:  [id number]   MEDICAL RECORD NO.:  [id number]          PATIENT TYPE:  INP   LOCATION:  0001                         [facility]AC[facility]   PHYSICIAN:  Georges Lynch. Gioffre, M.D.DATE OF BIRTH:  22-Aug-1926   DATE OF PROCEDURE:  01/27/2007  DATE OF DISCHARGE:                               OPERATIVE REPORT   SURGEON:  Georges Lynch. Darrelyn Hillock, M.D.   ASSISTANT:  Jamelle Rushing, P.A.   PREOP DIAGNOSIS:  Severe degenerative arthritis with a genu varus  deformity, right knee.   POSTOPERATIVE DIAGNOSIS:  Severe degenerative arthritis with a genu  varus deformity, right knee.   OPERATION:  A right total knee arthroplasty utilizing the DePuy rotating  platform system.  All three components were cemented and vancomycin was  used in the cement.  We also used two Mitek metal anchors for  reinforcement of the patella.  The femoral component was a size 2.5  right, posterior cruciate sacrificing type.  The metal tibial tray was a  size 3, the tibial insert was a size 2.5 and 12.5 mm thickness.   The procedure:  Under spinal anesthesia, a routine orthopedic prep and  draping of the right lower extremity was carried out.  The leg was  exsanguinated with an Esmarch and the tourniquet was elevated at 375 mm.  At this time an incision was made over the anterior aspect of the left  knee.  Bleeders were identified and cauterized.  Following that, we then  went down and created two flaps.  I then carried out a median  parapatellar incision to reflect the patella laterally, and flexed the  knee; and excised the anterior-posterior cruciate ligaments and I did  medial lateral meniscectomies.   Following that I made my initial drill hole in the intercondylar notch  of the right femur.  The femoral shaft then was irrigated.  At this time  I inserted my intramedullary guide and removed 10-mm thickness off of  the distal femur.  Following this, my #2 jig was inserted for a  size 2.5  femur.  I carried out my anterior posterior and chamfer cuts.  Following  that, I then prepared the tibia.  We measured the tibia to be a size 3  tray.  A drill hole was made in the metaphysis of the tibia; and at this  time, we removed 4 mm thickness off the affected medial side of the  tibia.  After this we then went ahead and cut our keel cut out of the  tibia in the usual fashion.  We then cut our box cut out of the distal  femur.  We thoroughly irrigated the area out and inserted our trial  components, which was a size 2.5 right femur, posterior cruciate  sacrificing type, a size 3 tibial tray with a 12.5 mm thickness size 2.5  insert.   We then took the knee through motion.  We had excellent stability.  Prior to doing this, we measured our gaps in the usual fashion.  We then  measured the patella to be a size 35 patella; we  removed the appropriate  amount of patella off for a resurfacing procedure of the patella.  We  made three drill holes in the patella in the usual fashion.  We  thoroughly water picked out the knee after we removed all the trial  component.  We went on to dry the knee out; and then dried the knee and  then inserted our Gelfoam in the usual places in the tibial shaft, and  up into the femoral shaft.  I then inserted our vancomycin cement.  All  three components then were cemented in simultaneously.  We finally went  with a 12.5 mm thickness tibial insert and size 2.5 rotating platform.  Note, the permanent insert was inserted after we injected 20 mL of 1/4%  Marcaine with epinephrine and 30 mg of Toradol.  We then utilized  Flomax in-and-about the wound site for hemostatic purposes.  We then  inserted a drain, and closed the knee in layers in the usual fashion.  Skin was closed with metal staples; and sterile Neosporin dressing was  applied.  The patient had 1 gram of IV Ancef prior to surgery.           ______________________________  Georges Lynch  Darrelyn Hillock, M.D.     RAG/MEDQ  D:  01/27/2007  T:  01/27/2007  Job:  [id number]

## 2011-02-07 NOTE — Assessment & Plan Note (Signed)
[facility]                            CARDIOLOGY OFFICE NOTE   [name] [name], [name] [name]                     MRN:          [id number]  DATE:01/15/2007                            DOB:          11-03-1925    PRIMARY:  Dr. Lucila Maine.   REASON FOR PRESENTATION:  Evaluate the patient with abnormal EKG and  Cardiolite.   HISTORY OF PRESENT ILLNESS:  The patient is a pleasant 77 year old.  I  evaluated her for an abnormal EKG and a Cardiolite with apical and  lateral wall ischemia.  She had to be admitted for cardiac  catheterization.  She was admitted because of renal insufficiency.  She  was hydrated.  The catheterization done on March 27 demonstrated the  left main was normal, the LAD had mid calcification with mid 30%  stenosis, first diagonal had osteal 25% stenosis.  Second diagonal had  osteal 40% stenosis.  Circumflex was normal.  The right coronary artery  had 25% stenosis.  PDA had osteal 30% stenosis.  There was no left  ventriculogram.  The patient did have followup creatinines that were  stable.  She was referred back to Dr. Lorin Picket for followup of her renal  insufficiency and also anemia.  Of note, before discharge, we did make  sure that her hemoglobin was stable and that she was guaiac negative.   Since discharge, the patient has had no complaints.  She had a little  bruising down into her right groin.  She has had no chest discomfort.  No shortness of breath.  She has had no palpitations, pre-syncope, or  syncope.  She is due to have knee surgery and is getting preoperative  evaluation on Monday.   PAST MEDICAL HISTORY:  Hyperlipidemia x2 years.  Hypertension x3 years.  Degenerative joint disease.  Diabetes x25 years.  Left knee replacement  in 2006.  Left shoulder surgery.  Right shoulder surgery.  Cataract  surgery.  Appendectomy.   ALLERGIES:  None.   CURRENT MEDICATIONS:  1. Aspirin 325 mg daily.  2. Amlodipine/benazepril 5/20  daily.  3. Benicar 40 mg daily.  4. Amitriptyline 10 mg daily.  5. Simvastatin 40 mg daily.  6. Metoprolol 50 mg daily.  7. Humulin.   REVIEW OF SYSTEMS:  Stated in the HPI and otherwise negative for other  systems.   PHYSICAL EXAMINATION:  The patient is in no distress.  Blood pressure 143/57, heart rate 55 and regular.  HEENT:  Eyelids unremarkable.  Pupils equal, round, and reactive to  light.  Fundi not visualized.  NECK:  No jugular venous distension.  Wave form within normal limits.  Carotid upstroke brisk and symmetric.  No bruits.  No thyromegaly.  LYMPHATICS:  No adenopathy.  LUNGS:  Clear to auscultation bilaterally.  BACK:  No costovertebral angle tenderness.  CHEST:  Unremarkable.  HEART:  PMI not displaced or sustained, S1 and S2 within normal limits,  no S3, no S4, no clicks, rubs, murmurs.  ABDOMEN:  Morbidly obese, positive bowel sounds, normal in frequency and  pitch, no bruits, rebound, guarding.  No midline pulsatile masses,  organomegaly.  SKIN:  No rashes, no nodules.  EXTREMITIES:  With 2+ pulses, no edema.   EKG sinus rhythm.  Rate 55.  Axis within normal limits.  Intervals  within normal limits.  Inferolateral T wave inversions unchanged from  previous.   ASSESSMENT AND PLAN:  1. Abnormal EKG.  The patient does not have any coronary artery      disease to explain this.  This, then, is a repolarization      abnormality.  No further cardiovascular testing is planned.  2. Hypertension.  Blood pressure is elevated in the mornings.  I am      going to ask her to start taking her Benicar in the evening and      keep a blood pressure diary.  3. Anemia per Dr. Lorin Picket.  4. Preoperative clearance.  The patient is at acceptable risk for the      planned surgery.  5. Renal insufficiency.  The patient's creatinine was stable at      discharge.  This is followed by her primary care physician.  She is      going to have some labs on Monday that will include a  creatinine,      and this can be followed up by the ordering physicians and her      primary care doctor.  6. Obesity.  She understands the need to lose weight with diet and      exercise.  7. Followup will be in this clinic as needed.     Rollene Rotunda, MD, [facility]  Electronically Signed    JH/MedQ  DD: 01/15/2007  DT: 01/15/2007  Job #: [id number]   cc:   Dr. Lucila Maine

## 2011-02-07 NOTE — Assessment & Plan Note (Signed)
[facility]                            CARDIOLOGY OFFICE NOTE   [name] [name], [name] [name]                     MRN:          [id number]  DATE:12/08/2006                            DOB:          Mar 28, 1926    PRIMARY CARE PHYSICIAN:  Lucila Maine, M.D.   REASON FOR CONSULTATION:  Evaluate patient with abnormal Cardiolite.   HISTORY OF PRESENT ILLNESS:  The patient is a lovely 77 year old white  female without prior cardiac history. She was recently being evaluated  for total knee replacement. However, an EKG was markedly abnormal,  demonstrating lateral deep T wave inversion, apparently new from  previous EKG's. The patient had further evaluation including an  echocardiogram. This demonstrates some diastolic dysfunction by Doppler  parameters, mild left ventricular hypertrophy, and asymmetric septal  hypertrophy. There was no evidence of outflow obstruction. There was  moderate mitral regurgitation. The patient also had a stress perfusion  study. This demonstrated an EF of 65%. There was a small area of apical  and lateral wall ischemia. There was hypokinesis of the apical inferior  wall as well. The patient was advised that she might need  catheterization and requested evaluation at [facility].   She has been active and actually does water aerobics. She is limited by  her joint problems. She does not get any chest discomfort, neck or arm  discomfort. She does not have any exercise induced nausea and vomiting  or excessive diaphoresis. She does not describe palpitations. She thinks  her shortness of breath is in keeping with activity and denies any PND  or orthopnea.   PAST MEDICAL HISTORY:  Hyperlipidemia x2 years, hypertension x3 years,  degenerative joint disease, diabetes x25 years.   PAST SURGICAL HISTORY:  Left knee replacement in 2006, left shoulder  surgery, right shoulder surgery, cataract surgery, appendectomy.   ALLERGIES:  NO  KNOWN DRUG ALLERGIES.   MEDICATIONS:  Humulin, Simvastatin 40 mg daily, Amitriptyline 10 mg  daily, Benicar 40 mg daily, hydrocodone, garlic, cinnamon, fiber choice,  cherry fruit, cranberry, pomegranate, vinegar, butter melon, and  glucosamine.   SOCIAL HISTORY:  The patient is retired. She is a widow. She has 2  children. She does not smoke cigarettes or drink alcohol.   FAMILY HISTORY:  Contributory of a father dying suddenly of a myocardial  infarction at age 37.   REVIEW OF SYSTEMS:  As stated in the HPI and negative for other systems.   PHYSICAL EXAMINATION:  GENERAL:  The patient is very pleasant and in no  distress.  VITAL SIGNS:  Blood pressure 165/67, heart rate 61 and regular.  HEENT:  Eyes unremarkable. Pupils are equal, round, and reactive to  light. Fundi within normal limits. Oral mucosa within normal limits.  NECK:  No jugular venous distention at 45 degrees. Carotid upstroke  brisk and symmetric. Transmitted systolic murmur versus bruit, no  thyromegaly.  LYMPHATICS:  No cervical, axillary, or inguinal adenopathy.  LUNGS:  Clear to auscultation bilaterally.  BACK:  No costovertebral tenderness.  CHEST:  Unremarkable.  HEART:  PMI not displaced or sustained. S1 and S2 within  normal limits.  No S3, no S4. A 2 out of 6 systolic murmur heard slightly at the apex  and best at the right upper sternal border, increasing slightly with a  strained phase of Valsalva. No diastolic murmurs.  ABDOMEN:  Obese. Positive bowel sounds. Normal frequency and pitch. No  bruits, no rebound, no guarding. No midline pulsatile mass. No  hepatosplenomegaly or splenomegaly.  SKIN:  No rashes. No nodules.  EXTREMITIES:  2+ pulses throughout. No clubbing, cyanosis, or edema.  NEUROLOGIC:  Oriented to person, place, and time. Cranial nerves 2-12  are grossly intact. Motor grossly intact.   LABORATORY DATA:  EKG:  Sinus rhythm. Rate 98, axis within normal  limits. Intervals within normal  limits. Lateral T wave inversion,  consistent with ischemia.   ASSESSMENT:  1. Abnormal Cardiolite and EKG. The patient has a markedly abnormal      EKG and mildly abnormal Cardiolite. She has significant      cardiovascular risk factors. The pre-test probability of      obstructive coronary disease is somewhat high. Therefore, based on      this, the patient does need cardiac catheterization. Further      evaluation and risk stratification will be based on the results of      this. I have outlined the risks to include stroke, heart attack,      death, bleeding, bruising, vascular trauma, embolism, renal      insufficiency, allergy. The patient understands and agrees to      proceed. I explained this to her and her daughter.  2. Hypertension. She does have some evidence of diastolic dysfunction      and left ventricular hypertrophy. She needs excellent blood      pressure control and I have advised her to buy a blood pressure      cuff and to take this diary to Dr. Lorin Picket for medication titration.      We will also discuss therapeutic lifestyle changes (TLC) to include      weight loss.  3. Asymmetric septal hypertrophy. The patient does have some element      of this. This needs to be followed with physical exams and      echocardiograms.   FOLLOWUP:  We will see her at the time of the catheterization.     Rollene Rotunda, MD, [facility]  Electronically Signed   JH/MedQ  DD: 12/08/2006  DT: 12/08/2006  Job #: [id number]   cc:   Lucila Maine, M.D.

## 2011-02-07 NOTE — Discharge Summary (Signed)
NAME:  [name] [name], [name] [name]              ACCOUNT NO.:  [id number]   MEDICAL RECORD NO.:  [id number]          PATIENT TYPE:  ORB   LOCATION:  4505                         FACILITY:  [facility]   PHYSICIAN:  Georges Lynch. Gioffre, M.D.DATE OF BIRTH:  [birth year]   DATE OF ADMISSION:  05/05/2005  DATE OF DISCHARGE:  05/10/2005                                 DISCHARGE SUMMARY   ADMISSION DIAGNOSES:  1.  Degenerative arthritis, left knee.  2.  Insulin-dependent diabetes.  3.  Hypertension.  4.  High cholesterol.  5.  Osteoporosis.   DISCHARGE DIAGNOSES:  1.  Degenerative arthritis, left knee, status post left total knee      arthroplasty.  2.  Insulin-dependent diabetes.  3.  Hypertension.  4.  High cholesterol.  5.  Osteoporosis.   PROCEDURES:  The patient was admitted to the hospital and taken to the  operating room on April 30, 2005, to undergo a left total knee arthroplasty.   SURGEON:  Georges Lynch. Darrelyn Hillock, M.D.   ASSISTANT:  Ebbie Ridge. Paitsel, P.A.   ANESTHESIA:  Spinal.   DRAIN:  Hemovac drain was placed at the time of surgery.   BRIEF HISTORY:  This is a 77 year old female having left knee pain for at  least one year.  The pain has been increasing in severity with activity.  She has had no relief from nonsteroidal anti-inflammatory medications for  the Synvisc injection series.  She has also had multiple cortisone  injections without relief, and the patient elects to proceed with a left  total knee arthroplasty and was admitted to hospital for same.   LABORATORY DATA:  Admission CBC:  WBC 6.7, RBC 3.50 (slightly low),  hemoglobin 10.6 (slightly low), platelets 219,000.  Admission PT 12.9, INR  1.0, PTT 34.  Admission chemistry:  Sodium 140, potassium 4.9, chloride 104,  CO2 26, glucose 219, elevated BUN 40, elevated creatinine 1.8, elevated  calcium 9.1, total  protein 6.6, albumin 3.8.  Admission urinalysis:  Specific gravity 1.021, leukocyte esterase small, microscopic exam  revealed  rare epithelial cells and 0-2 WBC's per high-powered field.  The patient's  blood type is A positive, negative antibody screen.  Admission EKG:  Normal  sinus rhythm with a rate of 82, preoperative x-ray of left knee revealed  degenerative disease, worse in the medial compartment.  Postoperative x-ray  of left knee revealed left total knee prosthesis in good alignment.  Preoperative chest x-ray revealed borderline cardiomegaly.  No active  disease.   HOSPITAL COURSE:  The patient was admitted to [facility] and taken  to the operating room.  She underwent the above stated procedure without  complications.  She tolerated the procedure well and was allowed to return  to the recovery room, then the orthopedic floor to continue postoperative  care.  Hemovac drain was placed at the time of surgery.  Hemovac was  discontinued on postoperative day #1.  The patient was placed on PC  analgesia for pain control.  The patient's pain was well controlled.  She  was weaned to oral analgesics, and the PCA was discontinued on postoperative  day #3.  The patient's hemoglobin and hematocrit was followed throughout her  hospitalization.  She had a postoperative drop in her hemoglobin to 7.8.  That was closely followed.  The patient was asymptomatic.  Her hemoglobin  was rechecked at 8.1, and she did not require blood transfusion.  PT was  consulted for gait training ambulation.  The patient felt that she needed a  little more time to progress with weight training, and she was evaluated by  the rehab department for possible admission to the rehab SACU unit at [facility].  Bed became available, and the patient was transferred to  [facility] on postoperative day #5.  Condition at the time of transfer, stable.   TRANSFER MEDICATIONS:  The patient will resume her routine medications.  She  will also use:  1.  Percocet 10/650 1-2 q.6 h p.r.n. pain.  2.  Robaxin 500 mg one q.6 h p.r.n.  spasm.  3.  Coumadin per pharmacy protocol.   FOLLOWUP:  The patient will follow up with Dr. Darrelyn Hillock two weeks from the  date of surgery.  She will have her sutures removed two weeks from the date  of surgery.  She will call the office to schedule the appointment.   DISCHARGE INSTRUCTIONS:  1.  Wound care:  She will keep the wound dry and clean and change the      dressing daily.  2.  Ambulate full weight bearing with walker.      Ebbie Ridge. Paitsel, P.A.    ______________________________  Georges Lynch Darrelyn Hillock, M.D.    [place] Dome  D:  05/22/2005  T:  05/22/2005  Job:  [id number]

## 2011-02-07 NOTE — Discharge Summary (Signed)
NAME:  [name] [name], [name] [name]              ACCOUNT NO.:  [id number]   MEDICAL RECORD NO.:  [id number]          PATIENT TYPE:  INP   LOCATION:  1606                         FACILITY:  [facility]   PHYSICIAN:  Georges Lynch. Gioffre, M.D.DATE OF BIRTH:  Oct 31, 1925   DATE OF ADMISSION:  01/27/2007  DATE OF DISCHARGE:  02/01/2007                               DISCHARGE SUMMARY   HISTORY OF PRESENT ILLNESS:  The patient is a 77 year old female who was  admitted for severe pain and difficulty with range of motion of her  right knee.  The patient has been worked up and failed conservative  treatment for significant arthritis of that right knee.  She elected to  proceed with a total knee arthroplasty.  The patient has had a  significant medical workup and cardiac workup and has been cleared for  this surgical procedure.   ADMISSION DIAGNOSES:  1. End-stage osteoarthritis, right knee.  2. Hypertension.  3. History of cardiac clearance with stress test.  4. Mild to moderate renal insufficiency.  5. Chronic anemia due to chronic renal insufficiency.  6. Diabetes type 2.  7. Esophageal reflux.  8. Obesity.   DISCHARGE DIAGNOSES:  1. Right total knee arthroplasty.  2. Asymptomatic postoperative blood loss anemia.  The patient      tolerated it well and was allowed to correct with p.o. supplements,      no transfusions.  3. Poorly-controlled diabetes.  4. Moderate renal insufficiency, stable.  5. Hypertension.  6. Hypercholesterolemia.  7. History of reflux disease.   MEDICATIONS ON ADMISSION:  1. Alprazolam 0.5 mg p.r.n.  2. Aspirin 325 mg daily.  3. Metoprolol 50 mg a day.  4. Simvastatin 40 mg a day.  5. Amlodipine/benazepril 5/20 mg a day.  6. Benicar 40 mg a day.  7. Humulin insulin 70/30 28 units in the a.m., 18 units in the p.m.  8. Amitriptyline 10 mg a day.   ALLERGIES:  No known drug allergies.   SURGICAL PROCEDURE:  On Jan 27, 2007, the patient was taken to the OR by  Windy Fast A.  Gioffre, M.D., assisted by Jamelle Rushing, P.A., and under  spinal anesthesia, the patient underwent a right total knee arthroplasty  with the following components:  A size 2.5 right femoral component, a  size 3 keeled tibial tray, a size 2.5 12.5 mm polyethylene bearing, a 35  mm three-peg patella.  All components were implanted with polymethyl  methacrylate with vancomycin mixed in.  The patient tolerated the  procedure well and was transferred to the recovery room and then the  orthopedic floor for routine total knee protocol and IV antibiotics and  pain medicines.   CONSULTATIONS:  Routine consults were requested from physical therapy,  occupational therapy and case management.   HOSPITAL COURSE:  On Jan 27, 2007, the patient was admitted to [facility] under the care of Dr. Worthy Rancher.  The patient was taken  to the OR, where the patient had a right total knee arthroplasty  performed.  The patient tolerated the procedure well, was transferred to  the recovery room and  then to the orthopedic floor in good condition for  the total knee protocol.  The patient then incurred a total of 5 days  postoperative care, in which the patient had no significant untoward  events and tolerated the transition from IV antibiotics and pain  medicines to p.o. medications well without any difficulties.  The  patient did develop some postoperative blood loss anemia.  Her  hemoglobin dropped to 8.9, but the patient's vital signs remained  stable.  The patient tolerated well with physical therapy and so it was  allowed to self-correct with p.o. supplements without transfusions.  The  patient's renal insufficiency did remain stable.  The patient worked  well with physical therapy and the use of the CPM.  Her wound remained  benign for any signs of infection.  Her leg remained neuro, motor,  vascularly intact.  It was felt that the patient needed some assistance  in the postoperative phase at home  due to her multiple other medical  issues and her family situation, so arrangements were made for a skilled  nursing facility and she will be transferred to that facility today in  good condition.   LABORATORY DATA:  H&H on admission found she was preop anemic at 9.7 and  29.0.  Postop on May 10 she was 8.9 and 26.7.  INR is 2.4 the day of  discharge.  Estimated GFR was between 24 and 31 during her  hospitalization.  She had a few bacteria in her urine and was treated  with perioperative antibiotics.  Preoperative chest x-ray showed chronic  changes, no acute pulmonary findings.   DISCHARGE INSTRUCTIONS:  Diet:  The patient is to maintain a low-calorie  ADA diet.   Wound care:  The patient is to have a wound check daily for any signs of  infection, staples removed on postop day #14.  Date of discharge to  skilled nursing facility is #5.  Staples removed and Steri-Strips  applied.   Activity:  The patient is to be up with physical therapy daily.  She is  weightbearing as tolerated.  The patient is to work with physical  therapy for range of motion and strength in her right lower extremity on  a daily basis.   Follow-up:  The patient needs a follow-up appointment with Dr. Darrelyn Hillock  in the office 2 weeks from the date of surgery.  Date of surgery was on  May 7.  The patient is to have the arrangements made for her, [phone].   DISCHARGE MEDICATIONS:  1. Robaxin 500 mg one tablet 3 times a day for muscle spasms as      needed.  2. Percocet 10/650 mg one tablet every 4-6 hours as needed.  3. Coumadin 4 mg a day to maintain an INR of 2-2.5.  4. Humulin insulin 70/30 30 units in the a.m., 18 units subcutaneous      in the p.m.  5. Simvastatin 40 mg a day.  6. Amitriptyline 10 mg a day.  7. Benicar 40 mg a day.  8. Metoprolol 50 mg a day.  9. Amlodipine/benazepril 5/20 mg one tablet a day.  10.Aspirin is to be discharged while on Coumadin.  The patient's condition on discharge to  skilled nursing facility is  listed as good.      Jamelle Rushing, P.A.    ______________________________  Georges Lynch Darrelyn Hillock, M.D.    R[facility]/MEDQ  D:  02/01/2007  T:  02/01/2007  Job:  [id number]

## 2011-02-07 NOTE — Discharge Summary (Signed)
NAME:  [name] [name], REGISTER NO.:  [id number]   MEDICAL RECORD NO.:  [id number]          PATIENT TYPE:  ORB   LOCATION:  4505                         FACILITY:  [facility]   PHYSICIAN:  Ellwood Dense, M.D.   DATE OF BIRTH:  1926/01/23   DATE OF ADMISSION:  05/05/2005  DATE OF DISCHARGE:  05/09/2005                                 DISCHARGE SUMMARY   DISCHARGE DIAGNOSES:  1.  Left total knee arthroplasty secondary to degenerative joint disease.  2.  Pain management.  3.  Coumadin for deep venous thrombosis.  4.  Postoperative anemia.  5.  Insulin-dependent diabetes mellitus.  6.  Hypertension.  7.  Hyperlipidemia.   HISTORY OF PRESENT ILLNESS:  This is a 77 year old female admitted on April 30, 2005, to [facility] with end-stage changes of the left knee and  no relief with conservative care.  She underwent a left total knee  arthroplasty on August 9, per Dr. Darrelyn Hillock and placed on Coumadin for deep  venous thrombosis prophylaxis with weightbearing as tolerated, postoperative  anemia, no chest pain or shortness of breath.  She was admitted to subacute  care services.   PAST MEDICAL HISTORY:  See discharge diagnoses.   ALLERGIES:  No known drug allergies.   SOCIAL HISTORY:  Lives with her husband and daughter.  Husband with  Parkinson's disease and attends adult day care from 10 a.m. to 3 p.m.  Daughter can assist as needed.   MEDICATIONS PRIOR TO ADMISSION:  Starlix, insulin 70/30, Lescol, Elavil,  Diovan, Actonel weekly and aspirin.   HOSPITAL COURSE:  The patient with progressive gains while on rehabilitation  services with therapies initiated daily.  The following issues were followed  during the patient's rehabilitation course:  Pertaining to Ms. Theys' left  total knee arthroplasty, surgical site is healing nicely with no signs of  infection.  She was weightbearing as tolerated.  A home health nurse had  been arranged to remove staples on Monday,  August 21, and apply Steri-  Strips.  Pain management ongoing with the use of oxycodone and good results.  She remained on Coumadin for deep venous thrombosis prophylaxis with latest  INR 2.6.  Home health nurse had been arranged to check next INR on May 12, 2005.  She would resume her aspirin therapy after Coumadin completed.  Postoperative anemia she was transfused upon her admission to subacute care  services 2 units of packed red blood cells for a hemoglobin of 7.8, followup  hemoglobin of 10.5.  She would remain on iron supplement.  Her blood sugars  overall were well-controlled on home regimens of Starlix and insulin 70/30.  She would followup with her primary M.D., Dr. Lorin Picket.  Blood pressure with  diastolic pressure of 68-72 on her Diovan/hydrochlorothiazide and a low  sodium diet.   Functionally, she progressed nicely throughout her rehabilitation stay as  she was supervision for bed mobility, minimum contact guard for transfers  ambulating 100 feet with a walker.  Simple set up for upper body activities  of daily living, minimal assist for lower body.  Home health  therapies would  be ongoing.   Latest labs showed an INR of 2.6, hemoglobin 10.5, hematocrit 31, platelets  314,000.  Sodium 137, potassium 3.8, BUN 33, creatinine 1.6 with noted  history of mild renal insufficiency with baseline creatinine of 1.8.  She  was discharged home in stable condition.   DISCHARGE MEDICATIONS:  1.  Coumadin 2 mg two tablets daily until May 31, 2005.  2.  Lescol 80 mg daily.  3.  Elavil 10 mg at bedtime.  4.  Diovan with hydrochlorothiazide 80/12.5 daily.  5.  Insulin 70/30 26 units in the a.m. and 12 units in the p.m.  6.  Starlix 120 mg twice daily.  7.  Trinsicon twice daily.  8.  Oxycodone as needed for pain.   ACTIVITY:  As tolerated.   DIET:  Diabetic diet.   SPECIAL INSTRUCTIONS:  Home health nurse per Genevieve Norlander to check next INR on  Monday, August 21.  Home health nurse  to remove staples in left knee on  Monday, August 21, and apply Steri-Strips.   FOLLOW UP:  Follow up with Dr. Ranee Gosselin, [phone], orthopedic  services.      Mariam Dollar, P.A.    ______________________________  Ellwood Dense, M.D.    DA/MEDQ  D:  05/09/2005  T:  05/09/2005  Job:  [id number]   cc:   Georges Lynch. Darrelyn Hillock, M.D.  [facility]  [address]  [address]  [place]  [place] [address]  Fax: [phone]   Lorin Picket, M.D.  [place], [place]

## 2011-03-18 ENCOUNTER — Encounter: Payer: Self-pay | Admitting: Cardiology

## 2011-05-15 ENCOUNTER — Encounter: Payer: Self-pay | Admitting: Cardiovascular Disease

## 2011-05-24 HISTORY — PX: TRANSTHORACIC ECHOCARDIOGRAM: SHX275

## 2011-05-27 ENCOUNTER — Ambulatory Visit (INDEPENDENT_AMBULATORY_CARE_PROVIDER_SITE_OTHER): Payer: Medicare Other | Admitting: Cardiovascular Disease

## 2011-05-27 ENCOUNTER — Encounter: Payer: Self-pay | Admitting: Cardiovascular Disease

## 2011-05-27 DIAGNOSIS — R011 Cardiac murmur, unspecified: Secondary | ICD-10-CM

## 2011-05-27 DIAGNOSIS — R9431 Abnormal electrocardiogram [ECG] [EKG]: Secondary | ICD-10-CM

## 2011-05-27 DIAGNOSIS — I10 Essential (primary) hypertension: Secondary | ICD-10-CM

## 2011-05-27 NOTE — Assessment & Plan Note (Signed)
Blood pressure is well-controlled on her current medical program. 

## 2011-05-27 NOTE — Progress Notes (Signed)
HPI:  This is an 77 year old woman presenting for followup evaluation. She has been noted to have a markedly abnormal EKG in the past. She has a diffuse ST and T wave abnormality concerning for ischemia. She underwent cardiac catheterization in 2008 for full evaluation of her abnormal EKG and this demonstrated only mild nonobstructive CAD.  The The patient has undergone multiple orthopedic surgeries without complications. She is planning on having surgery of the nonhealing right foot ulcer. This is to be done under local anesthesia possibly with conscious sedation but she will not undergo general anesthesia for this.  The patient was referred back for evaluation because of an irregular heart rhythm and concern about the possibility of atrial fibrillation. She denies palpitations, lightheadedness, or near-syncope. She denies chest pain or pressure. She does have chronic dyspnea with exertion and she has not been very active in recent months.  Outpatient Encounter Prescriptions as of 05/27/2011  Medication Sig Dispense Refill  . Aliskiren-Valsartan (VALTURNA) 300-320 MG TABS Take 1 tablet by mouth daily.        Marland Kitchen amitriptyline (ELAVIL) 10 MG tablet Take 10 mg by mouth daily.        Marland Kitchen aspirin 325 MG tablet Take 325 mg by mouth daily.        . carvedilol (COREG) 6.25 MG tablet Take 3.125 mg by mouth 2 (two) times daily.        . fish oil-omega-3 fatty acids 1000 MG capsule Take 2 g by mouth daily.        . Garlic POWD 1 tablet by Does not apply route daily.        . Glucosamine-Chondroitin 250-200 MG CAPS Take 1 capsule by mouth daily.        . insulin NPH-insulin regular (HUMULIN 70/30) (70-30) 100 UNIT/ML injection Inject into the skin.        Marland Kitchen DISCONTD: Ginger Root POWD 1 tablet by Does not apply route daily.        Marland Kitchen DISCONTD: olmesartan (BENICAR) 40 MG tablet Take 40 mg by mouth daily.        Marland Kitchen DISCONTD: simvastatin (ZOCOR) 40 MG tablet Take 40 mg by mouth at bedtime.          Not on File  Past  Medical History  Diagnosis Date  . Diabetes mellitus     mellitus? x25 years  . HTN (hypertension)     x2 years  . Hypercholesterolemia     x 2 years  . Renal insufficiency     mild to moderate  . Anemia     chronic anemia due to chronic disease  . CAD (coronary artery disease)     nonobstructive     ROS: Negative except as per HPI  BP 130/52  Pulse 69  Ht 5\' 1"  (1.549 m)  Wt 200 lb (90.719 kg)  BMI 37.79 kg/m2  PHYSICAL EXAM: Pt is alert and oriented, obese woman in NAD HEENT: normal Neck: JVP - normal, carotids 2+= without bruits Lungs: CTA bilaterally CV: RRR with a grade 2/6 harsh systolic murmur at the left sternal border radiating to the neck Abd: soft, NT, Positive BS, no hepatomegaly Ext: no C/C/E, distal pulses intact and equal Skin: warm/dry no rash  EKG:  Normal sinus rhythm 69 beats per minute, first-degree AV block, marked ST abnormality consider inferolateral subendocardial injury, no significant change since previous tracing.  ASSESSMENT AND PLAN:

## 2011-05-27 NOTE — Patient Instructions (Signed)
Your physician recommends that you schedule a follow-up appointment as needed with Dr. Excell Seltzer.   Your physician has requested that you have an echocardiogram. Echocardiography is a painless test that uses sound waves to create images of your heart. It provides your doctor with information about the size and shape of your heart and how well your heart's chambers and valves are working. This procedure takes approximately one hour. There are no restrictions for this procedure.

## 2011-05-27 NOTE — Assessment & Plan Note (Addendum)
The patient has a harsh systolic heart murmur that could be related to aortic stenosis or even hypertrophic cardiomyopathy considering her EKG abnormality. I have recommended a 2-D echocardiogram for further evaluation.  Again, she has no symptoms of syncope, chest pressure, or lightheadedness. I suspect her chronic dyspnea is related to obesity.

## 2011-05-27 NOTE — Assessment & Plan Note (Signed)
I have reviewed the patient's EKG dated August 23. By my review it demonstrates marked sinus bradycardia with a premature atrial contraction. I do not think the patient has atrial fibrillation. Her ST/T-wave abnormalities unchanged from baseline. She's been documented to have mild nonobstructive coronary disease and needs no other ischemic workup at this time

## 2011-06-04 ENCOUNTER — Ambulatory Visit (HOSPITAL_COMMUNITY): Payer: Medicare Other | Attending: Cardiovascular Disease | Admitting: Radiology

## 2011-06-04 DIAGNOSIS — E785 Hyperlipidemia, unspecified: Secondary | ICD-10-CM | POA: Insufficient documentation

## 2011-06-04 DIAGNOSIS — I059 Rheumatic mitral valve disease, unspecified: Secondary | ICD-10-CM | POA: Insufficient documentation

## 2011-06-04 DIAGNOSIS — I079 Rheumatic tricuspid valve disease, unspecified: Secondary | ICD-10-CM | POA: Insufficient documentation

## 2011-06-04 DIAGNOSIS — R9431 Abnormal electrocardiogram [ECG] [EKG]: Secondary | ICD-10-CM | POA: Insufficient documentation

## 2011-06-04 DIAGNOSIS — R011 Cardiac murmur, unspecified: Secondary | ICD-10-CM | POA: Insufficient documentation

## 2011-06-12 ENCOUNTER — Telehealth: Payer: Self-pay | Admitting: Cardiovascular Disease

## 2011-06-12 NOTE — Telephone Encounter (Signed)
Spoke with daughter to review findings of echo and limitations. Advised normal activities are fine, needs to stay hydrated with lots of fluids, avoid extreme heat and vigorous exercise (she is inactive because of orthopedic problems).

## 2011-06-12 NOTE — Telephone Encounter (Signed)
Pt daughter would like to speak to MD to clarify pt d/c instructions given at last visit.   Pt was told that she cannot participate in strenuous exercise. Pt daughter would like to clarify what MD considers strenuous exercise. Please return call to discuss further.

## 2012-06-17 ENCOUNTER — Other Ambulatory Visit: Payer: Self-pay

## 2012-06-17 DIAGNOSIS — Z01810 Encounter for preprocedural cardiovascular examination: Secondary | ICD-10-CM

## 2012-06-17 DIAGNOSIS — N184 Chronic kidney disease, stage 4 (severe): Secondary | ICD-10-CM

## 2012-06-29 ENCOUNTER — Ambulatory Visit: Payer: Medicare Other | Admitting: Vascular Surgery

## 2012-07-05 ENCOUNTER — Encounter: Payer: Self-pay | Admitting: Vascular Surgery

## 2012-07-06 ENCOUNTER — Encounter: Payer: Self-pay | Admitting: Vascular Surgery

## 2012-07-06 ENCOUNTER — Encounter (HOSPITAL_COMMUNITY): Payer: Self-pay

## 2012-07-06 ENCOUNTER — Other Ambulatory Visit: Payer: Self-pay | Admitting: *Deleted

## 2012-07-06 ENCOUNTER — Encounter (HOSPITAL_COMMUNITY): Payer: Self-pay | Admitting: *Deleted

## 2012-07-06 ENCOUNTER — Ambulatory Visit (INDEPENDENT_AMBULATORY_CARE_PROVIDER_SITE_OTHER): Payer: Medicare Other | Admitting: Vascular Surgery

## 2012-07-06 ENCOUNTER — Encounter: Payer: Self-pay | Admitting: *Deleted

## 2012-07-06 VITALS — BP 177/47 | HR 62 | Ht 61.0 in | Wt 184.0 lb

## 2012-07-06 DIAGNOSIS — Z01810 Encounter for preprocedural cardiovascular examination: Secondary | ICD-10-CM

## 2012-07-06 DIAGNOSIS — N186 End stage renal disease: Secondary | ICD-10-CM

## 2012-07-06 DIAGNOSIS — N184 Chronic kidney disease, stage 4 (severe): Secondary | ICD-10-CM

## 2012-07-06 NOTE — Progress Notes (Signed)
Upper extremity vein mapping performed @ [facility] 07/06/2012

## 2012-07-06 NOTE — Progress Notes (Signed)
Subjective:     Patient ID: [name] [name], female   DOB: 1925/09/28, 78 y.o.   MRN: [id number]  HPI this 78 year old female was referred by Dr. Allena Katz for vascular access. Patient has not yet had hemodialysis. Her renal function has deteriorated over the past 6 months. She is left-handed. He does have diabetes mellitus-type I.  Past Medical History  Diagnosis Date  . Diabetes mellitus     mellitus? x25 years  . HTN (hypertension)     x2 years  . Hypercholesterolemia     x 2 years  . Renal insufficiency     mild to moderate  . Anemia     chronic anemia due to chronic disease  . CAD (coronary artery disease)     nonobstructive     History  Substance Use Topics  . Smoking status: Never Smoker   . Smokeless tobacco: Never Used   Comment: no smoking   . Alcohol Use: No    Family History  Problem Relation Age of Onset  . Heart attack Father     No Known Allergies  Current outpatient prescriptions:Calcium Citrate-Vitamin D (CALCIUM CITRATE + PO), Take by mouth daily., Disp: , Rfl: ;  furosemide (LASIX) 40 MG tablet, Take 40 mg by mouth daily., Disp: , Rfl: ;  metoprolol succinate (TOPROL-XL) 25 MG 24 hr tablet, Take 25 mg by mouth daily., Disp: , Rfl: ;  nateglinide (STARLIX) 60 MG tablet, Take 60 mg by mouth as needed., Disp: , Rfl:  sodium bicarbonate 650 MG tablet, Take 650 mg by mouth 2 (two) times daily., Disp: , Rfl: ;  Aliskiren-Valsartan (VALTURNA) 300-320 MG TABS, Take 1 tablet by mouth daily.  , Disp: , Rfl: ;  amitriptyline (ELAVIL) 10 MG tablet, Take 10 mg by mouth daily.  , Disp: , Rfl: ;  aspirin 325 MG tablet, Take 325 mg by mouth daily.  , Disp: , Rfl: ;  carvedilol (COREG) 6.25 MG tablet, Take 3.125 mg by mouth 2 (two) times daily.  , Disp: , Rfl:  fish oil-omega-3 fatty acids 1000 MG capsule, Take 2 g by mouth daily.  , Disp: , Rfl: ;  Garlic POWD, 1 tablet by Does not apply route daily.  , Disp: , Rfl: ;  Glucosamine-Chondroitin 250-200 MG CAPS, Take 1 capsule by  mouth daily.  , Disp: , Rfl: ;  insulin NPH-insulin regular (HUMULIN 70/30) (70-30) 100 UNIT/ML injection, Inject into the skin.  , Disp: , Rfl:   BP 177/47  Pulse 62  Ht 5\' 1"  (1.549 m)  Wt 184 lb (83.462 kg)  BMI 34.77 kg/m2  SpO2 98%  Body mass index is 34.77 kg/(m^2).           Review of Systems denies chest pain, dyspnea on exertion, PND, orthopnea, claudication, lateralizing weakness. Does have a history of productive cough. No amaurosis fugax for dizziness or syncope. Other systems negative and complete review of systems    Objective:   Physical Exam blood pressure 177/47 heart rate 62 respirations 18 Gen.-alert and oriented x3 in no apparent distress HEENT normal for age Lungs no rhonchi or wheezing Cardiovascular regular rhythm no murmurs carotid pulses 3+ palpable no bruits audible Abdomen soft nontender no palpable masses Musculoskeletal free of  major deformities Skin clear -no rashes Neurologic normal Lower extremities 3+ femoral pulses bilaterally. Upper extremities with 3+ brachial 2+ radial pulses palpable bilaterally. Surface veins in the forearms are borderline.  Today I ordered bilateral upper extremity vein mapping which are reviewed  and interpreted. The right cephalic vein which is her nondominant arm appears to be of adequate size in the upper arm.      Assessment:     End-stage renal disease-needs vascular access-left-handed-diabetes mellitus type 1    Plan:     Plan right brachial-cephalic AV fistula tomorrow. Risks of fistula thrombosis or steal syndrome discussed with patient and her daughter and they understand. Will proceed tomorrow

## 2012-07-07 ENCOUNTER — Ambulatory Visit (HOSPITAL_COMMUNITY): Payer: Medicare Other | Admitting: Certified Registered Nurse Anesthetist

## 2012-07-07 ENCOUNTER — Ambulatory Visit (HOSPITAL_COMMUNITY): Payer: Medicare Other

## 2012-07-07 ENCOUNTER — Encounter (HOSPITAL_COMMUNITY)
Admission: RE | Disposition: A | Discharge status: Home / Self Care | Payer: Self-pay | Source: Ambulatory Visit | Attending: Vascular Surgery

## 2012-07-07 ENCOUNTER — Encounter (HOSPITAL_COMMUNITY): Payer: Self-pay | Admitting: *Deleted

## 2012-07-07 ENCOUNTER — Ambulatory Visit (HOSPITAL_COMMUNITY)
Admission: RE | Admit: 2012-07-07 | Discharge: 2012-07-07 | Disposition: A | Discharge status: Home / Self Care | Payer: Medicare Other | Source: Ambulatory Visit | Attending: Vascular Surgery | Admitting: Vascular Surgery

## 2012-07-07 ENCOUNTER — Encounter (HOSPITAL_COMMUNITY): Payer: Self-pay | Admitting: Certified Registered Nurse Anesthetist

## 2012-07-07 DIAGNOSIS — N186 End stage renal disease: Secondary | ICD-10-CM

## 2012-07-07 DIAGNOSIS — I2510 Atherosclerotic heart disease of native coronary artery without angina pectoris: Secondary | ICD-10-CM | POA: Insufficient documentation

## 2012-07-07 DIAGNOSIS — Z794 Long term (current) use of insulin: Secondary | ICD-10-CM | POA: Insufficient documentation

## 2012-07-07 DIAGNOSIS — I120 Hypertensive chronic kidney disease with stage 5 chronic kidney disease or end stage renal disease: Secondary | ICD-10-CM | POA: Insufficient documentation

## 2012-07-07 DIAGNOSIS — E7800 Pure hypercholesterolemia, unspecified: Secondary | ICD-10-CM | POA: Insufficient documentation

## 2012-07-07 DIAGNOSIS — E119 Type 2 diabetes mellitus without complications: Secondary | ICD-10-CM | POA: Insufficient documentation

## 2012-07-07 DIAGNOSIS — K219 Gastro-esophageal reflux disease without esophagitis: Secondary | ICD-10-CM | POA: Insufficient documentation

## 2012-07-07 HISTORY — PX: AV FISTULA PLACEMENT: SHX1204

## 2012-07-07 HISTORY — DX: Gastro-esophageal reflux disease without esophagitis: K21.9

## 2012-07-07 LAB — POCT I-STAT, CHEM 8
BUN: 60 mg/dL — ABNORMAL HIGH (ref 6–23)
Creatinine, Ser: 2.9 mg/dL — ABNORMAL HIGH (ref 0.50–1.10)
Potassium: 4.2 mEq/L (ref 3.5–5.1)
Sodium: 142 mEq/L (ref 135–145)
TCO2: 25 mmol/L (ref 0–100)

## 2012-07-07 LAB — SURGICAL PCR SCREEN: Staphylococcus aureus: NEGATIVE

## 2012-07-07 SURGERY — ARTERIOVENOUS (AV) FISTULA CREATION
Anesthesia: Monitor Anesthesia Care | Site: Arm Upper | Laterality: Right | Wound class: Clean

## 2012-07-07 MED ORDER — MUPIROCIN 2 % EX OINT
TOPICAL_OINTMENT | CUTANEOUS | Status: AC
  Filled 2012-07-07: qty 22

## 2012-07-07 MED ORDER — OXYCODONE HCL 5 MG PO TABS: 5.0000 mg | ORAL_TABLET | ORAL | Status: DC | PRN

## 2012-07-07 MED ORDER — FENTANYL CITRATE 0.05 MG/ML IJ SOLN
INTRAMUSCULAR | Status: DC | PRN
  Administered 2012-07-07: 50 ug via INTRAVENOUS

## 2012-07-07 MED ORDER — LIDOCAINE-EPINEPHRINE (PF) 1 %-1:200000 IJ SOLN
INTRAMUSCULAR | Status: DC | PRN
  Administered 2012-07-07: 13 mL via INTRADERMAL

## 2012-07-07 MED ORDER — MUPIROCIN 2 % EX OINT
TOPICAL_OINTMENT | Freq: Once | CUTANEOUS | Status: DC
  Filled 2012-07-07: qty 22

## 2012-07-07 MED ORDER — MIDAZOLAM HCL 5 MG/5ML IJ SOLN
INTRAMUSCULAR | Status: DC | PRN
  Administered 2012-07-07 (×2): 1 mg via INTRAVENOUS

## 2012-07-07 MED ORDER — DEXTROSE 5 % IV SOLN
1.5000 g | INTRAVENOUS | Status: AC
  Administered 2012-07-07: 1.5 g via INTRAVENOUS
  Filled 2012-07-07: qty 1.5

## 2012-07-07 MED ORDER — OXYCODONE HCL 5 MG/5ML PO SOLN: 5.0000 mg | Freq: Once | ORAL | Status: DC | PRN

## 2012-07-07 MED ORDER — LIDOCAINE-EPINEPHRINE (PF) 1 %-1:200000 IJ SOLN
INTRAMUSCULAR | Status: AC
  Filled 2012-07-07: qty 10

## 2012-07-07 MED ORDER — PROPOFOL 10 MG/ML IV EMUL
INTRAVENOUS | Status: DC | PRN
  Administered 2012-07-07: 25 ug/kg/min via INTRAVENOUS

## 2012-07-07 MED ORDER — OXYCODONE HCL 5 MG PO TABS: 5.0000 mg | ORAL_TABLET | Freq: Once | ORAL | Status: DC | PRN

## 2012-07-07 MED ORDER — 0.9 % SODIUM CHLORIDE (POUR BTL) OPTIME
TOPICAL | Status: DC | PRN
  Administered 2012-07-07: 1000 mL

## 2012-07-07 MED ORDER — HYDROMORPHONE HCL PF 1 MG/ML IJ SOLN: 0.2500 mg | INTRAMUSCULAR | Status: DC | PRN

## 2012-07-07 MED ORDER — SODIUM CHLORIDE 0.9 % IV SOLN
INTRAVENOUS | Status: DC | PRN
  Administered 2012-07-07: 11:00:00 via INTRAVENOUS

## 2012-07-07 MED ORDER — SODIUM CHLORIDE 0.9 % IR SOLN
Status: DC | PRN
  Administered 2012-07-07: 12:00:00

## 2012-07-07 MED ORDER — METOPROLOL SUCCINATE 12.5 MG HALF TABLET
12.5000 mg | ORAL_TABLET | Freq: Once | ORAL | Status: AC
  Administered 2012-07-07: 12.5 mg via ORAL
  Filled 2012-07-07: qty 1

## 2012-07-07 MED ORDER — DROPERIDOL 2.5 MG/ML IJ SOLN: 0.6250 mg | INTRAMUSCULAR | Status: DC | PRN

## 2012-07-07 MED ORDER — SODIUM CHLORIDE 0.9 % IV SOLN
INTRAVENOUS | Status: DC
  Administered 2012-07-07: 11:00:00 via INTRAVENOUS

## 2012-07-07 SURGICAL SUPPLY — 29 items
ADH SKN CLS APL DERMABOND .7 (GAUZE/BANDAGES/DRESSINGS) ×1
CANISTER SUCTION 2500CC (MISCELLANEOUS) ×2 IMPLANT
CATH EMB 3FR 40CM (CATHETERS) ×1 IMPLANT
CLIP TI MEDIUM 6 (CLIP) ×2 IMPLANT
CLIP TI WIDE RED SMALL 6 (CLIP) ×2 IMPLANT
CLOTH BEACON ORANGE TIMEOUT ST (SAFETY) ×2 IMPLANT
COVER PROBE W GEL 5X96 (DRAPES) IMPLANT
COVER SURGICAL LIGHT HANDLE (MISCELLANEOUS) ×2 IMPLANT
DERMABOND ADVANCED (GAUZE/BANDAGES/DRESSINGS) ×1
DERMABOND ADVANCED .7 DNX12 (GAUZE/BANDAGES/DRESSINGS) ×1 IMPLANT
ELECT REM PT RETURN 9FT ADLT (ELECTROSURGICAL) ×2
ELECTRODE REM PT RTRN 9FT ADLT (ELECTROSURGICAL) ×1 IMPLANT
GEL ULTRASOUND 20GR AQUASONIC (MISCELLANEOUS) IMPLANT
GLOVE SS BIOGEL STRL SZ 7 (GLOVE) ×1 IMPLANT
GLOVE SUPERSENSE BIOGEL SZ 7 (GLOVE) ×1
GOWN STRL NON-REIN LRG LVL3 (GOWN DISPOSABLE) ×4 IMPLANT
KIT BASIN OR (CUSTOM PROCEDURE TRAY) ×2 IMPLANT
KIT ROOM TURNOVER OR (KITS) ×2 IMPLANT
NS IRRIG 1000ML POUR BTL (IV SOLUTION) ×2 IMPLANT
PACK CV ACCESS (CUSTOM PROCEDURE TRAY) ×2 IMPLANT
PAD ARMBOARD 7.5X6 YLW CONV (MISCELLANEOUS) ×4 IMPLANT
SPONGE GAUZE 4X4 12PLY (GAUZE/BANDAGES/DRESSINGS) ×2 IMPLANT
SUT PROLENE 6 0 BV (SUTURE) ×2 IMPLANT
SUT VIC AB 3-0 SH 27 (SUTURE) ×2
SUT VIC AB 3-0 SH 27X BRD (SUTURE) ×1 IMPLANT
TOWEL OR 17X24 6PK STRL BLUE (TOWEL DISPOSABLE) ×2 IMPLANT
TOWEL OR 17X26 10 PK STRL BLUE (TOWEL DISPOSABLE) ×2 IMPLANT
UNDERPAD 30X30 INCONTINENT (UNDERPADS AND DIAPERS) ×2 IMPLANT
WATER STERILE IRR 1000ML POUR (IV SOLUTION) ×2 IMPLANT

## 2012-07-07 NOTE — Preoperative (Signed)
Beta Blockers   Reason not to administer Beta Blockers:Not Applicable 

## 2012-07-07 NOTE — Anesthesia Preprocedure Evaluation (Signed)
Anesthesia Evaluation  Patient identified by MRN, date of birth, ID band Patient awake    Reviewed: Allergy & Precautions, H&P , NPO status , Patient's Chart, lab work & pertinent test results, reviewed documented beta blocker date and time   History of Anesthesia Complications Negative for: history of anesthetic complications  Airway Mallampati: II TM Distance: >3 FB     Dental  (+) Teeth Intact and Dental Advisory Given   Pulmonary neg pulmonary ROS,  breath sounds clear to auscultation        Cardiovascular hypertension, Pt. on medications and Pt. on home beta blockers Rhythm:Regular Rate:Normal + Systolic murmurs    Neuro/Psych negative neurological ROS     GI/Hepatic GERD-  ,  Endo/Other  diabetes, Insulin Dependent  Renal/GU Renal InsufficiencyRenal disease     Musculoskeletal   Abdominal   Peds  Hematology negative hematology ROS (+)   Anesthesia Other Findings   Reproductive/Obstetrics                           Anesthesia Physical Anesthesia Plan  ASA: III  Anesthesia Plan: MAC   Post-op Pain Management:    Induction: Intravenous  Airway Management Planned: Simple Face Mask  Additional Equipment:   Intra-op Plan:   Post-operative Plan:   Informed Consent: I have reviewed the patients History and Physical, chart, labs and discussed the procedure including the risks, benefits and alternatives for the proposed anesthesia with the patient or authorized representative who has indicated his/her understanding and acceptance.   Dental advisory given  Plan Discussed with: CRNA, Anesthesiologist and Surgeon  Anesthesia Plan Comments:         Anesthesia Quick Evaluation

## 2012-07-07 NOTE — Interval H&P Note (Signed)
History and Physical Interval Note:  07/07/2012 10:49 AM  [name] [name]  has presented today for surgery, with the diagnosis of ESRD  The various methods of treatment have been discussed with the patient and family. After consideration of risks, benefits and other options for treatment, the patient has consented to  Procedure(s) (LRB) with comments: ARTERIOVENOUS (AV) FISTULA CREATION (Right) as a surgical intervention .  The patient's history has been reviewed, patient examined, no change in status, stable for surgery.  I have reviewed the patient's chart and labs.  Questions were answered to the patient's satisfaction.     [name] [name]

## 2012-07-07 NOTE — Progress Notes (Signed)
Call to dr. Krista Blue to sign patient out.

## 2012-07-07 NOTE — Anesthesia Procedure Notes (Signed)
Procedure Name: MAC Date/Time: 07/07/2012 11:06 AM Performed by: Margaree Mackintosh Pre-anesthesia Checklist: Patient identified, Timeout performed, Emergency Drugs available, Suction available and Patient being monitored Patient Re-evaluated:Patient Re-evaluated prior to inductionOxygen Delivery Method: Simple face mask Intubation Type: IV induction Placement Confirmation: positive ETCO2 Dental Injury: Teeth and Oropharynx as per pre-operative assessment

## 2012-07-07 NOTE — Op Note (Signed)
OPERATIVE REPORT  Date of Surgery: 07/07/2012  Surgeon: Josephina Gip, MD  Assistant: Lianne Cure PA Pre-op Diagnosis: End Stage Renal Disease   Post-op Diagnosis: End Stage Renal Disease   Procedure: Procedure(s): ARTERIOVENOUS (AV) FISTULA CREATION-right brachial-cephalic Anesthesia: MAC  EBL: Minimal  Complications: None  Procedure Details: The patient was taken to the operating room placed in supine position at which time the right upper extremity was prepped with Betadine scrub and solution draped in routine sterile manner. After infiltration with 1% Xylocaine with epinephrine a transverse incision was made in the antecubital area antecubital vein dissected free. Cephalic vein was of good caliber of approximately 3 mm in size it was traced distally down to its junction with the basilic vein. Was ligated and transected gently dilated with heparinized saline. Was an excellent vein. Brachial artery was exposed and circled with Vesseloops. An excellent pulse. The artery was temporarily occluded with vessel loops of 15 blade extended with Potts scissors. It had excellent inflow. Vein was carefully measured spatulated and anastomosed end to side with 6-0 Prolene. Vesseloops were released and there was an excellent pulse and thrill in the fistula in the upper arm. Initially there was no flow in the radial artery distally but after 10-15 minutes flow returned which was significantly enhanced by compression of the fistula. The patient had no symptoms of pain or numbness in the right hand. Adequate hemostasis was achieved wound closed in layers with Vicryl in a subcuticular fashion sterile dressing applied including Dermabond the patient taken to recovery room in stable condition    Josephina Gip, MD 07/07/2012 12:37 PM

## 2012-07-07 NOTE — H&P (View-Only) (Signed)
Subjective:     Patient ID: [name] [name], female   DOB: 07/17/1926, 78 y.o.   MRN: [id number]  HPI this 78-year-old female was referred by Dr. Patel for vascular access. Patient has not yet had hemodialysis. Her renal function has deteriorated over the past 6 months. She is left-handed. He does have diabetes mellitus-type I.  Past Medical History  Diagnosis Date  . Diabetes mellitus     mellitus? x25 years  . HTN (hypertension)     x2 years  . Hypercholesterolemia     x 2 years  . Renal insufficiency     mild to moderate  . Anemia     chronic anemia due to chronic disease  . CAD (coronary artery disease)     nonobstructive     History  Substance Use Topics  . Smoking status: Never Smoker   . Smokeless tobacco: Never Used   Comment: no smoking   . Alcohol Use: No    Family History  Problem Relation Age of Onset  . Heart attack Father     No Known Allergies  Current outpatient prescriptions:Calcium Citrate-Vitamin D (CALCIUM CITRATE + PO), Take by mouth daily., Disp: , Rfl: ;  furosemide (LASIX) 40 MG tablet, Take 40 mg by mouth daily., Disp: , Rfl: ;  metoprolol succinate (TOPROL-XL) 25 MG 24 hr tablet, Take 25 mg by mouth daily., Disp: , Rfl: ;  nateglinide (STARLIX) 60 MG tablet, Take 60 mg by mouth as needed., Disp: , Rfl:  sodium bicarbonate 650 MG tablet, Take 650 mg by mouth 2 (two) times daily., Disp: , Rfl: ;  Aliskiren-Valsartan (VALTURNA) 300-320 MG TABS, Take 1 tablet by mouth daily.  , Disp: , Rfl: ;  amitriptyline (ELAVIL) 10 MG tablet, Take 10 mg by mouth daily.  , Disp: , Rfl: ;  aspirin 325 MG tablet, Take 325 mg by mouth daily.  , Disp: , Rfl: ;  carvedilol (COREG) 6.25 MG tablet, Take 3.125 mg by mouth 2 (two) times daily.  , Disp: , Rfl:  fish oil-omega-3 fatty acids 1000 MG capsule, Take 2 g by mouth daily.  , Disp: , Rfl: ;  Garlic POWD, 1 tablet by Does not apply route daily.  , Disp: , Rfl: ;  Glucosamine-Chondroitin 250-200 MG CAPS, Take 1 capsule by  mouth daily.  , Disp: , Rfl: ;  insulin NPH-insulin regular (HUMULIN 70/30) (70-30) 100 UNIT/ML injection, Inject into the skin.  , Disp: , Rfl:   BP 177/47  Pulse 62  Ht 5' 1" (1.549 m)  Wt 184 lb (83.462 kg)  BMI 34.77 kg/m2  SpO2 98%  Body mass index is 34.77 kg/(m^2).           Review of Systems denies chest pain, dyspnea on exertion, PND, orthopnea, claudication, lateralizing weakness. Does have a history of productive cough. No amaurosis fugax for dizziness or syncope. Other systems negative and complete review of systems    Objective:   Physical Exam blood pressure 177/47 heart rate 62 respirations 18 Gen.-alert and oriented x3 in no apparent distress HEENT normal for age Lungs no rhonchi or wheezing Cardiovascular regular rhythm no murmurs carotid pulses 3+ palpable no bruits audible Abdomen soft nontender no palpable masses Musculoskeletal free of  major deformities Skin clear -no rashes Neurologic normal Lower extremities 3+ femoral pulses bilaterally. Upper extremities with 3+ brachial 2+ radial pulses palpable bilaterally. Surface veins in the forearms are borderline.  Today I ordered bilateral upper extremity vein mapping which are reviewed   and interpreted. The right cephalic vein which is her nondominant arm appears to be of adequate size in the upper arm.      Assessment:     End-stage renal disease-needs vascular access-left-handed-diabetes mellitus type 1    Plan:     Plan right brachial-cephalic AV fistula tomorrow. Risks of fistula thrombosis or steal syndrome discussed with patient and her daughter and they understand. Will proceed tomorrow      

## 2012-07-07 NOTE — Anesthesia Postprocedure Evaluation (Signed)
Anesthesia Post Note  Patient: [name] [name]  Procedure(s) Performed: Procedure(s) (LRB): ARTERIOVENOUS (AV) FISTULA CREATION (Right)  Anesthesia type: MAC  Patient location: PACU  Post pain: Pain level controlled  Post assessment: Patient's Cardiovascular Status Stable  Last Vitals:  Filed Vitals:   07/07/12 1310  BP: 152/57  Pulse: 66  Temp: 36.6 C  Resp: 18    Post vital signs: Reviewed and stable  Level of consciousness: sedated  Complications: No apparent anesthesia complications

## 2012-07-07 NOTE — Transfer of Care (Signed)
Immediate Anesthesia Transfer of Care Note  Patient: [name] [name]  Procedure(s) Performed: Procedure(s) (LRB) with comments: ARTERIOVENOUS (AV) FISTULA CREATION (Right) - Brachial - cephalic arterivenous fistula  Patient Location: PACU  Anesthesia Type: MAC  Level of Consciousness: awake, alert  and oriented  Airway & Oxygen Therapy: Patient Spontanous Breathing  Post-op Assessment: Report given to PACU RN and Post -op Vital signs reviewed and stable  Post vital signs: Reviewed and stable  Complications: No apparent anesthesia complications

## 2012-07-08 ENCOUNTER — Encounter (HOSPITAL_COMMUNITY): Payer: Self-pay | Admitting: Vascular Surgery

## 2012-07-08 ENCOUNTER — Telehealth: Payer: Self-pay | Admitting: Vascular Surgery

## 2012-07-08 MED FILL — Mupirocin Oint 2%: CUTANEOUS | Qty: 22 | Status: AC

## 2012-07-08 NOTE — Telephone Encounter (Signed)
Spoke with patient to schedule follow up appointment, dpm

## 2012-07-08 NOTE — Telephone Encounter (Signed)
Message copied by Fredrich Birks on Thu Jul 08, 2012 10:53 AM ------      Message from: Melene Plan      Created: Wed Jul 07, 2012  3:39 PM                   ----- Message -----         From: Lars Mage, PA         Sent: 07/07/2012  12:12 PM           To: Melene Plan, RN            F/U 4 weeks s/p av fistula creation. Dr. Hart Rochester

## 2012-08-10 ENCOUNTER — Ambulatory Visit: Payer: Medicare Other | Admitting: Vascular Surgery

## 2012-08-16 ENCOUNTER — Encounter: Payer: Self-pay | Admitting: Vascular Surgery

## 2012-08-17 ENCOUNTER — Encounter: Payer: Self-pay | Admitting: Vascular Surgery

## 2012-08-17 ENCOUNTER — Ambulatory Visit (INDEPENDENT_AMBULATORY_CARE_PROVIDER_SITE_OTHER): Payer: Medicare Other | Admitting: Vascular Surgery

## 2012-08-17 VITALS — BP 102/58 | HR 69 | Ht 61.0 in | Wt 184.0 lb

## 2012-08-17 DIAGNOSIS — N186 End stage renal disease: Secondary | ICD-10-CM

## 2012-08-17 NOTE — Progress Notes (Signed)
Subjective:     Patient ID: Maricela Bo, female   DOB: 12-14-1925, 78 y.o.   MRN: [id number]  HPI this 78 year old female returns for followup regarding her right brachial-cephalic AV fistula which I created 07/07/2012 for chronic renal insufficiency. She has never been on hemodialysis. She will be seeing Dr. Allena Katz after the first of the year. She denies pain or numbness in the right hand.   Review of Systems     Objective:   Physical ExamBP 102/58  Pulse 69  Ht 5\' 1"  (1.549 m)  Wt 184 lb (83.462 kg)  BMI 34.77 kg/m2  SpO2 100%  General well-developed well-nourished elderly female in no apparent distress alert and oriented x3 Right upper extremity with well-healed antecubital wound. No radial pulse palpable without compression of the fistula at which point the radial pulse but comes 2+. Right hand is adequately perfused with good capillary refill. There is excellent pulse and palpable thrill in the upper arm over cephalic vein.     Assessment:     Nicely functioning right brachial cephalic AV fistula created 6 weeks ago. Patient not on hemodialysis.    Plan:     Hopefully this will be a good AV fistula if needed in the future. Encouraged patient to do exercises to help stimulate fistula maturation. Return to see [place] on when necessary basis

## 2012-11-02 ENCOUNTER — Encounter (HOSPITAL_COMMUNITY): Payer: Medicare Other

## 2013-06-15 ENCOUNTER — Encounter (HOSPITAL_COMMUNITY): Payer: Medicare Other

## 2013-09-29 ENCOUNTER — Other Ambulatory Visit: Payer: Self-pay | Admitting: Orthopedic Surgery

## 2013-09-29 DIAGNOSIS — S32009A Unspecified fracture of unspecified lumbar vertebra, initial encounter for closed fracture: Secondary | ICD-10-CM

## 2013-09-30 ENCOUNTER — Ambulatory Visit
Admission: RE | Admit: 2013-09-30 | Discharge: 2013-09-30 | Disposition: A | Discharge status: Home / Self Care | Payer: Medicare Other | Source: Ambulatory Visit | Attending: Orthopedic Surgery | Admitting: Orthopedic Surgery

## 2013-09-30 DIAGNOSIS — S32009A Unspecified fracture of unspecified lumbar vertebra, initial encounter for closed fracture: Secondary | ICD-10-CM

## 2013-10-01 ENCOUNTER — Inpatient Hospital Stay (HOSPITAL_COMMUNITY)
Admission: RE | Admit: 2013-10-01 | Discharge: 2013-10-14 | DRG: 477 | Disposition: A | Discharge status: Skilled Nursing Facility | Payer: Medicare Other | Source: Other Acute Inpatient Hospital | Attending: Internal Medicine | Admitting: Internal Medicine

## 2013-10-01 ENCOUNTER — Encounter (HOSPITAL_COMMUNITY): Payer: Self-pay | Admitting: General Practice

## 2013-10-01 DIAGNOSIS — I509 Heart failure, unspecified: Secondary | ICD-10-CM | POA: Diagnosis present

## 2013-10-01 DIAGNOSIS — E875 Hyperkalemia: Secondary | ICD-10-CM | POA: Diagnosis present

## 2013-10-01 DIAGNOSIS — I10 Essential (primary) hypertension: Secondary | ICD-10-CM

## 2013-10-01 DIAGNOSIS — R1319 Other dysphagia: Secondary | ICD-10-CM | POA: Diagnosis present

## 2013-10-01 DIAGNOSIS — I5031 Acute diastolic (congestive) heart failure: Secondary | ICD-10-CM | POA: Diagnosis present

## 2013-10-01 DIAGNOSIS — J690 Pneumonitis due to inhalation of food and vomit: Secondary | ICD-10-CM | POA: Diagnosis not present

## 2013-10-01 DIAGNOSIS — Z7982 Long term (current) use of aspirin: Secondary | ICD-10-CM

## 2013-10-01 DIAGNOSIS — S32009A Unspecified fracture of unspecified lumbar vertebra, initial encounter for closed fracture: Secondary | ICD-10-CM | POA: Diagnosis present

## 2013-10-01 DIAGNOSIS — E860 Dehydration: Secondary | ICD-10-CM | POA: Diagnosis present

## 2013-10-01 DIAGNOSIS — I2510 Atherosclerotic heart disease of native coronary artery without angina pectoris: Secondary | ICD-10-CM | POA: Diagnosis present

## 2013-10-01 DIAGNOSIS — R9431 Abnormal electrocardiogram [ECG] [EKG]: Secondary | ICD-10-CM

## 2013-10-01 DIAGNOSIS — X58XXXA Exposure to other specified factors, initial encounter: Secondary | ICD-10-CM | POA: Diagnosis present

## 2013-10-01 DIAGNOSIS — I2489 Other forms of acute ischemic heart disease: Secondary | ICD-10-CM | POA: Diagnosis present

## 2013-10-01 DIAGNOSIS — I248 Other forms of acute ischemic heart disease: Secondary | ICD-10-CM | POA: Diagnosis present

## 2013-10-01 DIAGNOSIS — A498 Other bacterial infections of unspecified site: Secondary | ICD-10-CM | POA: Diagnosis not present

## 2013-10-01 DIAGNOSIS — Z66 Do not resuscitate: Secondary | ICD-10-CM | POA: Diagnosis present

## 2013-10-01 DIAGNOSIS — R778 Other specified abnormalities of plasma proteins: Secondary | ICD-10-CM

## 2013-10-01 DIAGNOSIS — J9600 Acute respiratory failure, unspecified whether with hypoxia or hypercapnia: Secondary | ICD-10-CM | POA: Diagnosis not present

## 2013-10-01 DIAGNOSIS — K5900 Constipation, unspecified: Secondary | ICD-10-CM | POA: Diagnosis present

## 2013-10-01 DIAGNOSIS — Z794 Long term (current) use of insulin: Secondary | ICD-10-CM

## 2013-10-01 DIAGNOSIS — N185 Chronic kidney disease, stage 5: Secondary | ICD-10-CM | POA: Diagnosis present

## 2013-10-01 DIAGNOSIS — I421 Obstructive hypertrophic cardiomyopathy: Secondary | ICD-10-CM | POA: Diagnosis present

## 2013-10-01 DIAGNOSIS — E7800 Pure hypercholesterolemia, unspecified: Secondary | ICD-10-CM | POA: Diagnosis present

## 2013-10-01 DIAGNOSIS — I120 Hypertensive chronic kidney disease with stage 5 chronic kidney disease or end stage renal disease: Secondary | ICD-10-CM | POA: Diagnosis present

## 2013-10-01 DIAGNOSIS — D638 Anemia in other chronic diseases classified elsewhere: Secondary | ICD-10-CM | POA: Diagnosis present

## 2013-10-01 DIAGNOSIS — E1169 Type 2 diabetes mellitus with other specified complication: Secondary | ICD-10-CM | POA: Diagnosis present

## 2013-10-01 DIAGNOSIS — N184 Chronic kidney disease, stage 4 (severe): Secondary | ICD-10-CM

## 2013-10-01 DIAGNOSIS — Z96659 Presence of unspecified artificial knee joint: Secondary | ICD-10-CM

## 2013-10-01 DIAGNOSIS — K2289 Other specified disease of esophagus: Secondary | ICD-10-CM | POA: Diagnosis present

## 2013-10-01 DIAGNOSIS — N12 Tubulo-interstitial nephritis, not specified as acute or chronic: Secondary | ICD-10-CM | POA: Diagnosis not present

## 2013-10-01 DIAGNOSIS — R7989 Other specified abnormal findings of blood chemistry: Secondary | ICD-10-CM

## 2013-10-01 DIAGNOSIS — N390 Urinary tract infection, site not specified: Secondary | ICD-10-CM | POA: Diagnosis present

## 2013-10-01 DIAGNOSIS — R1310 Dysphagia, unspecified: Secondary | ICD-10-CM | POA: Diagnosis present

## 2013-10-01 DIAGNOSIS — M6282 Rhabdomyolysis: Principal | ICD-10-CM | POA: Diagnosis present

## 2013-10-01 DIAGNOSIS — K228 Other specified diseases of esophagus: Secondary | ICD-10-CM | POA: Diagnosis present

## 2013-10-01 DIAGNOSIS — E119 Type 2 diabetes mellitus without complications: Secondary | ICD-10-CM

## 2013-10-01 DIAGNOSIS — N186 End stage renal disease: Secondary | ICD-10-CM | POA: Diagnosis present

## 2013-10-01 DIAGNOSIS — E8770 Fluid overload, unspecified: Secondary | ICD-10-CM

## 2013-10-01 DIAGNOSIS — S32020A Wedge compression fracture of second lumbar vertebra, initial encounter for closed fracture: Secondary | ICD-10-CM | POA: Diagnosis present

## 2013-10-01 DIAGNOSIS — E440 Moderate protein-calorie malnutrition: Secondary | ICD-10-CM | POA: Diagnosis present

## 2013-10-01 DIAGNOSIS — E162 Hypoglycemia, unspecified: Secondary | ICD-10-CM | POA: Diagnosis present

## 2013-10-01 DIAGNOSIS — K219 Gastro-esophageal reflux disease without esophagitis: Secondary | ICD-10-CM | POA: Diagnosis present

## 2013-10-01 DIAGNOSIS — W19XXXA Unspecified fall, initial encounter: Secondary | ICD-10-CM | POA: Diagnosis present

## 2013-10-01 DIAGNOSIS — R011 Cardiac murmur, unspecified: Secondary | ICD-10-CM

## 2013-10-01 LAB — GLUCOSE, CAPILLARY: Glucose-Capillary: 172 mg/dL — ABNORMAL HIGH (ref 70–99)

## 2013-10-01 MED ORDER — INSULIN ASPART PROT & ASPART (70-30 MIX) 100 UNIT/ML ~~LOC~~ SUSP
19.0000 [IU] | Freq: Every day | SUBCUTANEOUS | Status: DC
  Administered 2013-10-01: 19 [IU] via SUBCUTANEOUS
  Filled 2013-10-01: qty 10

## 2013-10-01 MED ORDER — ATORVASTATIN CALCIUM 20 MG PO TABS
20.0000 mg | ORAL_TABLET | Freq: Every day | ORAL | Status: DC
  Administered 2013-10-01 – 2013-10-13 (×13): 20 mg via ORAL
  Filled 2013-10-01 (×15): qty 1

## 2013-10-01 MED ORDER — METOPROLOL SUCCINATE 12.5 MG HALF TABLET
12.5000 mg | ORAL_TABLET | Freq: Every day | ORAL | Status: DC
  Administered 2013-10-01 – 2013-10-12 (×12): 12.5 mg via ORAL
  Filled 2013-10-01 (×14): qty 1

## 2013-10-01 MED ORDER — ASPIRIN EC 81 MG PO TBEC
81.0000 mg | DELAYED_RELEASE_TABLET | Freq: Every day | ORAL | Status: DC
  Administered 2013-10-01 – 2013-10-14 (×14): 81 mg via ORAL
  Filled 2013-10-01 (×14): qty 1

## 2013-10-01 MED ORDER — AMITRIPTYLINE HCL 10 MG PO TABS
10.0000 mg | ORAL_TABLET | Freq: Every day | ORAL | Status: DC
  Administered 2013-10-01 – 2013-10-13 (×12): 10 mg via ORAL
  Filled 2013-10-01 (×14): qty 1

## 2013-10-01 MED ORDER — OMEGA-3-ACID ETHYL ESTERS 1 G PO CAPS
1.0000 g | ORAL_CAPSULE | Freq: Every day | ORAL | Status: DC
  Administered 2013-10-01 – 2013-10-10 (×7): 1 g via ORAL
  Filled 2013-10-01 (×10): qty 1

## 2013-10-01 MED ORDER — SODIUM BICARBONATE 650 MG PO TABS
650.0000 mg | ORAL_TABLET | Freq: Two times a day (BID) | ORAL | Status: DC
  Administered 2013-10-01 – 2013-10-14 (×24): 650 mg via ORAL
  Filled 2013-10-01 (×27): qty 1

## 2013-10-01 MED ORDER — FUROSEMIDE 40 MG PO TABS
40.0000 mg | ORAL_TABLET | Freq: Two times a day (BID) | ORAL | Status: DC
  Filled 2013-10-01 (×3): qty 1

## 2013-10-01 MED ORDER — DIAZEPAM 5 MG PO TABS: 5.0000 mg | ORAL_TABLET | Freq: Two times a day (BID) | ORAL | Status: DC | PRN

## 2013-10-01 MED ORDER — TRAMADOL HCL 50 MG PO TABS
50.0000 mg | ORAL_TABLET | Freq: Four times a day (QID) | ORAL | Status: DC | PRN
  Administered 2013-10-02: 50 mg via ORAL
  Administered 2013-10-03 – 2013-10-05 (×2): 100 mg via ORAL
  Filled 2013-10-01: qty 2
  Filled 2013-10-01: qty 1
  Filled 2013-10-01: qty 2

## 2013-10-01 MED ORDER — LANTHANUM CARBONATE 500 MG PO CHEW
500.0000 mg | CHEWABLE_TABLET | Freq: Three times a day (TID) | ORAL | Status: DC
  Administered 2013-10-02 – 2013-10-14 (×29): 500 mg via ORAL
  Filled 2013-10-01 (×40): qty 1

## 2013-10-01 MED ORDER — VITAMIN B-12 1000 MCG PO TABS
1000.0000 ug | ORAL_TABLET | Freq: Every day | ORAL | Status: DC
  Administered 2013-10-01 – 2013-10-14 (×14): 1000 ug via ORAL
  Filled 2013-10-01 (×14): qty 1

## 2013-10-01 MED ORDER — HEPARIN SODIUM (PORCINE) 5000 UNIT/ML IJ SOLN
5000.0000 [IU] | Freq: Three times a day (TID) | INTRAMUSCULAR | Status: AC
  Administered 2013-10-01 – 2013-10-11 (×25): 5000 [IU] via SUBCUTANEOUS
  Filled 2013-10-01 (×33): qty 1

## 2013-10-01 MED ORDER — FEBUXOSTAT 40 MG PO TABS
40.0000 mg | ORAL_TABLET | Freq: Every day | ORAL | Status: DC
  Administered 2013-10-01 – 2013-10-13 (×12): 40 mg via ORAL
  Filled 2013-10-01 (×15): qty 1

## 2013-10-01 MED ORDER — SODIUM CHLORIDE 0.9 % IV SOLN
INTRAVENOUS | Status: DC
  Administered 2013-10-01: via INTRAVENOUS

## 2013-10-01 MED ORDER — HYDROMORPHONE HCL 2 MG PO TABS
4.0000 mg | ORAL_TABLET | ORAL | Status: DC | PRN
  Filled 2013-10-01: qty 2

## 2013-10-01 MED ORDER — INSULIN ASPART PROT & ASPART (70-30 MIX) 100 UNIT/ML ~~LOC~~ SUSP: 19.0000 [IU] | Freq: Two times a day (BID) | SUBCUTANEOUS | Status: DC

## 2013-10-01 MED ORDER — NATEGLINIDE 60 MG PO TABS
60.0000 mg | ORAL_TABLET | Freq: Every day | ORAL | Status: DC
  Filled 2013-10-01 (×2): qty 1

## 2013-10-01 MED ORDER — INSULIN ASPART PROT & ASPART (70-30 MIX) 100 UNIT/ML ~~LOC~~ SUSP
29.0000 [IU] | Freq: Every day | SUBCUTANEOUS | Status: DC
Start: 2013-10-02 — End: 2013-10-02

## 2013-10-01 NOTE — H&P (Addendum)
[facility] History and Physical  [name] [name] [id number] DOB: Jan 05, 1926 DOA: 10/01/2013  Referring physician: EDP PCP: Ann Held, MD   Chief Complaint: Fall   HPI: [name] [name] is a 80 y.o. female who suffered a fall today at home.  She was on the ground and unable to get up for 8 hours.  She was brought in to [facility] where her CK was 650.  Due to the fact that she has CKD stage 4-5 at baseline, they transferred the patient to [facility]  Fall occurs in the context of starting new medications dilaudid and valium yesterday to control back pain.  Back pain is due to a compression fracture.  It had been hoped that back pain would be controlled on meds, possibly averting the need for a vertebroplasty scheduled on Friday of next week.  Review of Systems: Systems reviewed.  As above, otherwise negative  Past Medical History  Diagnosis Date  . Diabetes mellitus     mellitus? x25 years  . HTN (hypertension)     x2 years  . Hypercholesterolemia     x 2 years  . Renal insufficiency     mild to moderate  . Anemia     chronic anemia due to chronic disease  . CAD (coronary artery disease)     nonobstructive   . GERD (gastroesophageal reflux disease)    Past Surgical History  Procedure Laterality Date  . Total knee arthroplasty      L-2006; R-2008   . Appendectomy    . Back surgery      lower back  . Transthoracic echocardiogram  05/2011  . Av fistula placement  07/07/2012    Procedure: ARTERIOVENOUS (AV) FISTULA CREATION;  Surgeon: Mal Misty, MD;  Location: [facility] OR;  Service: Vascular;  Laterality: Right;  Brachial - cephalic arterivenous fistula   Social History:  reports that she has never smoked. She has never used smokeless tobacco. She reports that she does not drink alcohol or use illicit drugs.  No Known Allergies  Family History  Problem Relation Age of Onset  . Heart attack Father      Prior to Admission medications   Medication Sig Start  Date End Date Taking? Authorizing Provider  amitriptyline (ELAVIL) 10 MG tablet Take 10 mg by mouth at bedtime.    Yes Historical Provider, MD  aspirin EC 81 MG tablet Take 81 mg by mouth daily.   Yes Historical Provider, MD  atorvastatin (LIPITOR) 20 MG tablet Take 20 mg by mouth daily.   Yes Historical Provider, MD  Calcium-Magnesium-Vitamin D (CALCIUM 500 PO) Take 500 mg by mouth daily.   Yes Historical Provider, MD  CINNAMON PO Take 2 tablets by mouth daily.   Yes Historical Provider, MD  diazepam (VALIUM) 5 MG tablet Take 5 mg by mouth every 12 (twelve) hours as needed for anxiety.   Yes Historical Provider, MD  febuxostat (ULORIC) 40 MG tablet Take 40 mg by mouth at bedtime.   Yes Historical Provider, MD  fish oil-omega-3 fatty acids 1000 MG capsule Take 1 g by mouth daily.    Yes Historical Provider, MD  furosemide (LASIX) 40 MG tablet Take 40 mg by mouth 2 (two) times daily.    Yes Historical Provider, MD  GARLIC PO Take 1 tablet by mouth daily.   Yes Historical Provider, MD  Glucosamine-Chondroitin 250-200 MG CAPS Take 1 capsule by mouth daily.     Yes Historical Provider, MD  HYDROmorphone (DILAUDID) 4 MG  tablet Take 4 mg by mouth every 4 (four) hours as needed for severe pain.   Yes Historical Provider, MD  insulin aspart protamine- aspart (NOVOLOG MIX 70/30) (70-30) 100 UNIT/ML injection Inject 19-29 Units into the skin 2 (two) times daily with a meal. Uses 29 units in the morning and 19 units at night   Yes Historical Provider, MD  lanthanum (FOSRENOL) 500 MG chewable tablet Chew 500 mg by mouth 3 (three) times daily with meals.   Yes Historical Provider, MD  metoprolol succinate (TOPROL-XL) 25 MG 24 hr tablet Take 12.5 mg by mouth daily.    Yes Historical Provider, MD  Misc Natural Products (OSTEO BI-FLEX TRIPLE STRENGTH PO) Take 1 tablet by mouth daily.   Yes Historical Provider, MD  nateglinide (STARLIX) 60 MG tablet Take 60 mg by mouth every morning. Takes prior to large meal   Yes  Historical Provider, MD  sodium bicarbonate 650 MG tablet Take 650 mg by mouth 2 (two) times daily.   Yes Historical Provider, MD  traMADol (ULTRAM-ER) 100 MG 24 hr tablet Take 100-200 mg by mouth daily as needed for pain.   Yes Historical Provider, MD  vitamin B-12 (CYANOCOBALAMIN) 1000 MCG tablet Take 1,000 mcg by mouth daily.   Yes Historical Provider, MD   Physical Exam: Filed Vitals:   10/01/13 2059  BP: 156/46  Pulse: 85  Temp: 99.1 F (37.3 C)  Resp: 24    BP 156/46  Pulse 85  Temp(Src) 99.1 F (37.3 C) (Oral)  Resp 24  Ht 5\' 1"  (1.549 m)  Wt 87.136 kg (192 lb 1.6 oz)  BMI 36.32 kg/m2  SpO2 93%  General Appearance:    Alert, oriented, no distress, appears stated age  Head:    Normocephalic, atraumatic  Eyes:    PERRL, EOMI, sclera non-icteric        Nose:   Nares without drainage or epistaxis. Mucosa, turbinates normal  Throat:   Moist mucous membranes. Oropharynx without erythema or exudate.  Neck:   Supple. No carotid bruits.  No thyromegaly.  No lymphadenopathy.   Back:     No CVA tenderness, no spinal tenderness  Lungs:     Clear to auscultation bilaterally, without wheezes, rhonchi or rales  Chest wall:    No tenderness to palpitation  Heart:    Regular rate and rhythm without murmurs, gallops, rubs  Abdomen:     Soft, non-tender, nondistended, normal bowel sounds, no organomegaly  Genitalia:    deferred  Rectal:    deferred  Extremities:   No clubbing, cyanosis or edema.  Pulses:   2+ and symmetric all extremities  Skin:   Skin color, texture, turgor normal, no rashes or lesions  Lymph nodes:   Cervical, supraclavicular, and axillary nodes normal  Neurologic:   CNII-XII intact. Normal strength, sensation and reflexes      throughout    Labs on Admission:  Basic Metabolic Panel: No results found for this basename: NA, K, CL, CO2, GLUCOSE, BUN, CREATININE, CALCIUM, MG, PHOS,  in the last 168 hours Liver Function Tests: No results found for this basename:  AST, ALT, ALKPHOS, BILITOT, PROT, ALBUMIN,  in the last 168 hours No results found for this basename: LIPASE, AMYLASE,  in the last 168 hours No results found for this basename: AMMONIA,  in the last 168 hours CBC: No results found for this basename: WBC, NEUTROABS, HGB, HCT, MCV, PLT,  in the last 168 hours Cardiac Enzymes: No results found for this basename:  CKTOTAL, CKMB, CKMBINDEX, TROPONINI,  in the last 168 hours  BNP (last 3 results) No results found for this basename: PROBNP,  in the last 8760 hours CBG: No results found for this basename: GLUCAP,  in the last 168 hours  Radiological Exams on Admission: Dg Radiologist Eval And Mgmt  09/30/2013   EXAM: NEW PATIENT OFFICE VISIT - LEVEL II ([id number])  MEDICATIONS AND MEDICAL HISTORY: Past Medical History: Diabetes, hypertension, hypercholesterolemia, chronic renal insufficiency, anemia, coronary artery disease, GE reflux. Previous lumbar surgery 2010, knee replacement surgery x2. Dialysis shunt, has not used for dialysis yet.  Medications: amitriptyline (ELAVIL) 10 MG tablet Calcium Citrate-Vitamin D (CALCIUM CITRATE + PO) CINNAMON PO doxycycline (VIBRAMYCIN) 100 MG capsule fish oil-omega-3 fatty acids 1000 MG capsule furosemide (LASIX) 40 MG tablet Garlic POWD Glucosamine-Chondroitin 250-200 MG CAPS insulin NPH-insulin regular (HUMULIN 70/30) (70-30) 100 UNIT/ML injection lisinopril (PRINIVIL,ZESTRIL) 5 MG tablet metoprolol succinate (TOPROL-XL) 25 MG 24 hr tablet Misc Natural Products (OSTEO BI-FLEX TRIPLE STRENGTH PO) nateglinide (STARLIX) 60 MG tablet oxyCODONE (ROXICODONE) 5 MG immediate release tablet sodium bicarbonate 650 MG tablet  Allergies: No known.  No allergy to latex or iodinated contrast.  Social History: Widowed, Lives with her daughter, who accompanied her to the visit. No tobacco or alcohol use.  Family History: CVA in father and mother. Diabetes in maternal grandfather. Heart disease in father.  HISTORY OF PRESENT ILLNESS:  Pleasant 80year old female describes development of low lumbar pain in late December without any obvious inciting event. Pain is experienced centrally in the lower lumbar spine. No radicular component. No new bowel or bladder control issues. She has a history previous lumbar surgery at L4-5 ; this does not represent recurrence of the same symptoms. Symptoms are moderately severe, exacerbated by standing, lifting, and bending, relief when recumbent. She had been on oxycodone which lowered the pain to 6 out of 10 on the visual analog scale. She scores 20 out of 24 on the [employer] disability questionnaire. Previously, she could ambulate independently without assistance. Her pain result sitting in her no longer being able to drive. She no longer can sleep comfortably in the bed but has learned to sleep in a chair fairly comfortably. Pain medication was recently changed to a Dilaudid and she has not started this new narcotic pain medication regimen.  CHIEF COMPLAINT: Back pain  PHYSICAL EXAMINATION: Examined sitting. Normal mood and affect. Blood pressure 166/48, pulse 70, respirations 18, afebrile, O2 sats 96% on room air. Tenderness over lower lumbar spine.  Imaging:  MR lumbar spine from 09/24/2013 significant for L2 compression fracture deformity with approximately 50% loss of height, mild posterior retropulsion. There is diffusely abnormal marrow signal, nonspecific.  REVIEW OF SYSTEMS: Negative for myocardial infarction, stroke, or bleeding diathesis. Positive for numbness.  ASSESSMENT AND PLAN: My impression is that this pleasant patient has a non- healed L2 compression fracture deformity, likely etiology of her continued lower lumbar pain. She has yet to start a trial of her recently prescribed narcotic pain medication to see how her symptoms may respond to this. If pain persists at an unacceptable level or should she not tolerate the meds, I think she would anatomically be an appropriate candidate for  kyphoplasty/ vertebroplasty based on review of the MRI. I discussed with the patient and her daughter the procedures, anticipated benefits, limitations, possible risks and complications, and alternatives. They seemed to understand, their questions were answered, and they were motivated to proceed. Accordingly, we will check back with her on Monday  to see how she has been doing on her new Dilaudid pain medication. If needed, we can proceed with treatment at the L2 level at their convenience.  I appreciate your referral of this very pleasant patient. We will keep you updated with her progress.   Electronically Signed   By: Arne Cleveland M.D.   On: 09/30/2013 11:59    EKG: Independently reviewed.  Assessment/Plan Principal Problem:   Rhabdomyolysis Active Problems:   CKD (chronic kidney disease) stage 4, GFR 15-29 ml/min   1. Rhabdomyolysis - spoke with Dr. [name] who is very unimpressed with a CK of 650.  Will recheck this in the morning, planning on putting patient on 75 cc/hr of NS.  Recheck BMP in am, will otherwise leave her on all of her home meds without change.  PT/OT ordered to assess patient after fall.  Suspect that new meds may have played a role. 2. CKD stage 4 - currently at baseline, recheck in AM 3. DM2 - will leave patient on home meds, CBG checks AC/HS    Code Status: DNR  Family Communication: Family at bedside Disposition Plan: Admit to inpatient   Time spent: 48 min  GARDNER, JARED M. [facility] Pager [phone]  If 7AM-7PM, please contact the day team taking care of the patient [email or web address] Password [email or web address] 10/01/2013, 9:05 PM

## 2013-10-02 DIAGNOSIS — E162 Hypoglycemia, unspecified: Secondary | ICD-10-CM

## 2013-10-02 DIAGNOSIS — I10 Essential (primary) hypertension: Secondary | ICD-10-CM

## 2013-10-02 LAB — GLUCOSE, CAPILLARY
GLUCOSE-CAPILLARY: 169 mg/dL — AB (ref 70–99)
GLUCOSE-CAPILLARY: 17 mg/dL — AB (ref 70–99)
GLUCOSE-CAPILLARY: 47 mg/dL — AB (ref 70–99)
GLUCOSE-CAPILLARY: 188 mg/dL — AB (ref 70–99)
GLUCOSE-CAPILLARY: 252 mg/dL — AB (ref 70–99)
Glucose-Capillary: 49 mg/dL — ABNORMAL LOW (ref 70–99)
Glucose-Capillary: 43 mg/dL — CL (ref 70–99)
Glucose-Capillary: 74 mg/dL (ref 70–99)
Glucose-Capillary: 261 mg/dL — ABNORMAL HIGH (ref 70–99)

## 2013-10-02 LAB — BASIC METABOLIC PANEL
BUN: 75 mg/dL — AB (ref 6–23)
CALCIUM: 9 mg/dL (ref 8.4–10.5)
CO2: 23 mEq/L (ref 19–32)
CREATININE: 2.84 mg/dL — AB (ref 0.50–1.10)
Chloride: 105 mEq/L (ref 96–112)
GFR calc Af Amer: 16 mL/min — ABNORMAL LOW (ref >90)
GFR calc non Af Amer: 14 mL/min — ABNORMAL LOW (ref >90)
Glucose, Bld: <20 mg/dL — CL (ref 70–99)
Potassium: 4.6 mEq/L (ref 3.7–5.3)
Sodium: 142 mEq/L (ref 137–147)

## 2013-10-02 LAB — CK: CK TOTAL: 1026 U/L — AB (ref 7–177)

## 2013-10-02 MED ORDER — FUROSEMIDE 40 MG PO TABS
40.0000 mg | ORAL_TABLET | Freq: Two times a day (BID) | ORAL | Status: DC
  Administered 2013-10-04 – 2013-10-08 (×9): 40 mg via ORAL
  Filled 2013-10-02 (×12): qty 1

## 2013-10-02 MED ORDER — DEXTROSE 50 % IV SOLN
50.0000 mL | Freq: Once | INTRAVENOUS | Status: AC | PRN
  Administered 2013-10-02: 50 mL via INTRAVENOUS

## 2013-10-02 MED ORDER — DEXTROSE 50 % IV SOLN
INTRAVENOUS | Status: AC
  Administered 2013-10-02: 50 mL
  Filled 2013-10-02: qty 50

## 2013-10-02 MED ORDER — ONDANSETRON HCL 4 MG/2ML IJ SOLN
4.0000 mg | Freq: Four times a day (QID) | INTRAMUSCULAR | Status: DC | PRN
  Administered 2013-10-03 – 2013-10-05 (×2): 4 mg via INTRAVENOUS
  Filled 2013-10-02 (×2): qty 2

## 2013-10-02 MED ORDER — DIAZEPAM 2 MG PO TABS
2.0000 mg | ORAL_TABLET | Freq: Two times a day (BID) | ORAL | Status: DC | PRN
  Administered 2013-10-02 – 2013-10-14 (×3): 2 mg via ORAL
  Filled 2013-10-02 (×3): qty 1

## 2013-10-02 MED ORDER — DEXTROSE 50 % IV SOLN: 50.0000 mL | Freq: Once | INTRAVENOUS | Status: DC

## 2013-10-02 MED ORDER — DEXTROSE-NACL 5-0.9 % IV SOLN
INTRAVENOUS | Status: DC
  Administered 2013-10-02: 75 mL/h via INTRAVENOUS

## 2013-10-02 MED ORDER — SODIUM CHLORIDE 0.9 % IV SOLN
INTRAVENOUS | Status: DC
  Administered 2013-10-02: 1000 mL via INTRAVENOUS
  Administered 2013-10-02: 20:00:00 via INTRAVENOUS
  Administered 2013-10-03: 75 mL/h via INTRAVENOUS
  Administered 2013-10-05 – 2013-10-06 (×2): via INTRAVENOUS

## 2013-10-02 MED ORDER — DEXTROSE 50 % IV SOLN
1.0000 | Freq: Once | INTRAVENOUS | Status: AC
  Administered 2013-10-02: 50 mL via INTRAVENOUS

## 2013-10-02 NOTE — Progress Notes (Signed)
CRITICAL VALUE ALERT  Critical value received:  CBG 20   Date of notification:  10/02/2013  Time of notification:  4268  Critical value read back:yes  Nurse who received alert:  Colt Martelle Hill BSN, RN  MD notified (1st page):  Kathline Magic  Time of first page:  0610  MD notified (2nd page):  Time of second page:  Responding MD:  Kathline Magic  Time MD responded:  0615  Glucose Basic metabolic panel       Collected: 10/02/13 0350   Resulting lab: [id number]   Reference range: 70 - 99 mg/dL   Value: <20 (Low Panic)   Comment: REPEATED TO VERIFY    CRITICAL RESULT CALLED TO, READ BACK BY AND VERIFIED WITH:    R. HILL RN [phone] [place]

## 2013-10-02 NOTE — Progress Notes (Signed)
Hypoglycemic Event  CBG: 17  Treatment: D50 IV 50 mL  Symptoms: Pale and Sweaty  Follow-up CBG: XENM:0768 CBG Result:165  Possible Reasons for Event: Unknown  Comments/MD notified: MD on call Kathline Magic) notified.     [name] [name], [name] [name]  Remember to initiate Hypoglycemia Order Set & complete

## 2013-10-02 NOTE — Progress Notes (Addendum)
[facility] PROGRESS NOTE  [name] [name] WUJ:[id number] DOB: August 17, 1926 DOA: 10/01/2013 PCP: Ann Held, MD  Assessment/Plan: 1. Rhabdomyolysis - Nephro was informed of pt's condition [name]).  Recheck and follow CK levels. IVFs with dextrose added.  Recheck BMP in am. PT/OT ordered to assess patient after fall. Suspect that new meds may have played a role (valium and dilaudid). 2. CKD stage 4 - at baseline, recheck in AM 3. Severe hypoglycemia - Pt family reports that she took her 70/30 insulin yesterday but was not able to eat anything. Pt BS was 43 this morning, called for stat D50 IV, will add dextrose to IVFs, DC all diabetes meds and insulin, follow CBG closely.  Repeat CBG in 15 mins.  With patient's renal insufficiency, pt should NOT be restarted on 70/30 insulin as it carries HIGH RISK for hypoglycemia in this population.  4. Dehydration - continue IVF hydration   Code Status: DNR  Family Communication: Family at bedside  Disposition Plan: Admit to inpatient   HPI/Subjective: Pt reports that her back hurts  Objective: Filed Vitals:   10/02/13 0525  BP: 110/20  Pulse:   Temp:   Resp:     Intake/Output Summary (Last 24 hours) at 10/02/13 0933 Last data filed at 10/02/13 0300  Gross per 24 hour  Intake 471.25 ml  Output    175 ml  Net 296.25 ml   Filed Weights   10/01/13 1901  Weight: 192 lb 1.6 oz (87.136 kg)    Exam:   General:  Awake, no apparent distress, speaking clearly  Cardiovascular: normal s1, s2 sounds   Respiratory: BBS clear   Abdomen: soft, nondistended, nontender  Musculoskeletal: no CCE   Data Reviewed: Basic Metabolic Panel:  Recent Labs Lab 10/02/13 0350  NA 142  K 4.6  CL 105  CO2 23  GLUCOSE <20*  BUN 75*  CREATININE 2.84*  CALCIUM 9.0   Liver Function Tests: No results found for this basename: AST, ALT, ALKPHOS, BILITOT, PROT, ALBUMIN,  in the last 168 hours No results found for this basename: LIPASE,  AMYLASE,  in the last 168 hours No results found for this basename: AMMONIA,  in the last 168 hours CBC: No results found for this basename: WBC, NEUTROABS, HGB, HCT, MCV, PLT,  in the last 168 hours Cardiac Enzymes:  Recent Labs Lab 10/02/13 0350  CKTOTAL 1026*   BNP (last 3 results) No results found for this basename: PROBNP,  in the last 8760 hours CBG:  Recent Labs Lab 10/02/13 0621 10/02/13 0746 10/02/13 0821 10/02/13 0857 10/02/13 0925  GLUCAP 169* 49* 47* 43* 74    No results found for this or any previous visit (from the past 240 hour(s)).   Studies: No results found.  Scheduled Meds: . amitriptyline  10 mg Oral QHS  . aspirin EC  81 mg Oral Daily  . atorvastatin  20 mg Oral q1800  . dextrose  1 ampule Intravenous Once  . febuxostat  40 mg Oral QHS  . [START ON 10/04/2013] furosemide  40 mg Oral BID  . heparin  5,000 Units Subcutaneous Q8H  . lanthanum  500 mg Oral TID WC  . metoprolol succinate  12.5 mg Oral Daily  . omega-3 acid ethyl esters  1 g Oral Daily  . sodium bicarbonate  650 mg Oral BID  . vitamin B-12  1,000 mcg Oral Daily   Continuous Infusions: . dextrose 5 % and 0.9% NaCl      Principal Problem:  Rhabdomyolysis Active Problems:   CKD (chronic kidney disease) stage 4, GFR 15-29 ml/min   Herbie Lehrmann [employer] [phone]. If 7PM-7AM, please contact night-coverage at [email or web address], password [facility] 10/02/2013, 9:33 AM  LOS: 1 day

## 2013-10-02 NOTE — Evaluation (Signed)
Physical Therapy Evaluation Patient Details Name: [name] [name] MRN: [id number] DOB: Nov 05, 1925 Today's Date: 10/02/2013 Time: 8938-1017 PT Time Calculation (min): 13 min  PT Assessment / Plan / Recommendation History of Present Illness  patient admitted s/p fall.  Patient with recent fall with L2 compression fx and h/o CKD.  Now with rhabdo  Clinical Impression  Patient requiring increased assistance for mobility at present.  Requiring mod - max assist.  Do not feel patient safe to return home at this level.  Recommend Rehab consult to reach max potential prior to return home.  Will benefit from PT to increase mobility and independence with mobility.    PT Assessment  Patient needs continued PT services    Follow Up Recommendations  CIR    Does the patient have the potential to tolerate intense rehabilitation      Barriers to Discharge Decreased caregiver support      Equipment Recommendations  Rolling walker with 5" wheels;3in1 (PT)    Recommendations for Other Services Rehab consult   Frequency Min 3X/week    Precautions / Restrictions Precautions Precautions: Fall   Pertinent Vitals/Pain Patient with pain in back during mobility, states she had received pain meds earlier.      Mobility  Bed Mobility Overal bed mobility: Needs Assistance Bed Mobility: Supine to Sit;Sit to Supine Supine to sit: Mod assist Sit to supine: Mod assist Transfers Overall transfer level: Needs assistance Equipment used: Rolling walker (2 wheeled) Transfers: Sit to/from [facility] Sit to Stand: Total assist Stand pivot transfers: Mod assist General transfer comment: really needed +2 to stand, patient leaning heavily to right.  Did better with stand pivot transfer. Ambulation/Gait General Gait Details: unable    Exercises     PT Diagnosis: Difficulty walking;Generalized weakness  PT Problem List: Decreased strength;Decreased activity tolerance;Decreased  balance;Decreased mobility;Decreased knowledge of use of DME;Decreased safety awareness PT Treatment Interventions: DME instruction;Gait training;Functional mobility training;Therapeutic activities;Therapeutic exercise;Patient/family education;Balance training     PT Goals(Current goals can be found in the care plan section) Acute Rehab PT Goals Patient Stated Goal: go back home PT Goal Formulation: With patient Time For Goal Achievement: 10/16/13 Potential to Achieve Goals: Good  Visit Information  Last PT Received On: 10/02/13 Assistance Needed: +2 History of Present Illness: patient admitted s/p fall.  Patient with recent fall with L2 compression fx and h/o CKD.  Now with rhabdo       Prior [place] expects to be discharged to:: Private residence Living Arrangements: Children Available Help at Discharge: Family Type of Home: Apartment Home Access: Ramped entrance [place]: One [place]: Environmental consultant - 2 wheels Prior Function Level of Independence: Independent with assistive device(s) Communication Communication: No difficulties    Cognition  Cognition Arousal/Alertness: Awake/alert Behavior During Therapy: WFL for tasks assessed/performed Overall Cognitive Status: Impaired/Different from baseline Area of Impairment: Safety/judgement Safety/Judgement: Decreased awareness of safety General Comments: attempted OOB while PT unplugging IV; trying to stand with feet over to left, versus feet "underneath" her    Extremity/Trunk Assessment Upper Extremity Assessment Upper Extremity Assessment: Generalized weakness Lower Extremity Assessment Lower Extremity Assessment: Generalized weakness   Balance Balance Overall balance assessment: Needs assistance;History of Falls Sitting-balance support: Bilateral upper extremity supported Sitting balance-Leahy Scale: Poor Postural control: Right lateral lean  End of Session PT - End of  Session Equipment Utilized During Treatment: Gait belt Activity Tolerance: Patient limited by fatigue;Patient limited by pain Patient left: in bed;with call bell/phone within reach  GP     Shanna Cisco, [place] 10/02/2013, 4:47 PM

## 2013-10-02 NOTE — Progress Notes (Signed)
Hypoglycemic Event  CBG: 43  Treatment: 1 amp d50  Symptoms: dizziness  Follow-up CBG: Time: CBG Result:  Possible Reasons for Event:   Comments/MD notified:**dr. Estill Cotta, Marnette Burgess  Remember to initiate Hypoglycemia Order Set & complete

## 2013-10-02 NOTE — Progress Notes (Signed)
New Admission Note: Late Entry  Arrival Method: Via stretcher with [facility] Mental Orientation: Alert and oriented X4 Telemetry: Placed on Tele box 05 and CCMD notified of new patient [name] [name]) Assessment: Completed Skin: Patient has a rash under her abdominal fold; swollen feet, reddened discoloration on both legs IV: Left wrist IV, NSL. Clean, dry and intact  Pain: Patient states that she is no pain when she lays still but as soon as she moves she has pain in her lower back  Tubes: N/A Safety Measures: Safety Fall Prevention Plan has been given, discussed and signed Admission: Completed [address] [place] Orientation: Patient has been orientated to the room, unit and staff.  Family: Daughter [name] [place]) and son Gean [place]ch[place]) at bedside along with other family  Orders have been reviewed and implemented. Will continue to monitor the patient. Call light has been placed within reach and bed alarm has been activated.   [employer] BSN, RN  Phone number: [phone]

## 2013-10-03 LAB — CK: CK TOTAL: 1070 U/L — AB (ref 7–177)

## 2013-10-03 LAB — CBC
HCT: 31.8 % — ABNORMAL LOW (ref 36.0–46.0)
HEMOGLOBIN: 10.1 g/dL — AB (ref 12.0–15.0)
MCH: 31.3 pg (ref 26.0–34.0)
MCHC: 31.8 g/dL (ref 30.0–36.0)
MCV: 98.5 fL (ref 78.0–100.0)
Platelets: 307 10*3/uL (ref 150–400)
RBC: 3.23 MIL/uL — ABNORMAL LOW (ref 3.87–5.11)
RDW: 15.7 % — ABNORMAL HIGH (ref 11.5–15.5)
WBC: 10.8 10*3/uL — ABNORMAL HIGH (ref 4.0–10.5)

## 2013-10-03 LAB — COMPREHENSIVE METABOLIC PANEL
ALT: 20 U/L (ref 0–35)
AST: 36 U/L (ref 0–37)
Albumin: 2.5 g/dL — ABNORMAL LOW (ref 3.5–5.2)
Alkaline Phosphatase: 63 U/L (ref 39–117)
BUN: 66 mg/dL — ABNORMAL HIGH (ref 6–23)
CO2: 19 mEq/L (ref 19–32)
Calcium: 8 mg/dL — ABNORMAL LOW (ref 8.4–10.5)
Chloride: 108 mEq/L (ref 96–112)
Creatinine, Ser: 2.49 mg/dL — ABNORMAL HIGH (ref 0.50–1.10)
GFR calc non Af Amer: 17 mL/min — ABNORMAL LOW (ref >90)
GFR, EST AFRICAN AMERICAN: 19 mL/min — AB (ref >90)
Glucose, Bld: 255 mg/dL — ABNORMAL HIGH (ref 70–99)
Potassium: 5.3 mEq/L (ref 3.7–5.3)
SODIUM: 141 meq/L (ref 137–147)
Total Bilirubin: 0.3 mg/dL (ref 0.3–1.2)
Total Protein: 5.9 g/dL — ABNORMAL LOW (ref 6.0–8.3)

## 2013-10-03 LAB — GLUCOSE, CAPILLARY
GLUCOSE-CAPILLARY: 176 mg/dL — AB (ref 70–99)
GLUCOSE-CAPILLARY: 184 mg/dL — AB (ref 70–99)
GLUCOSE-CAPILLARY: 223 mg/dL — AB (ref 70–99)
GLUCOSE-CAPILLARY: 258 mg/dL — AB (ref 70–99)
GLUCOSE-CAPILLARY: 276 mg/dL — AB (ref 70–99)
Glucose-Capillary: 213 mg/dL — ABNORMAL HIGH (ref 70–99)
Glucose-Capillary: 203 mg/dL — ABNORMAL HIGH (ref 70–99)

## 2013-10-03 MED ORDER — INSULIN ASPART 100 UNIT/ML ~~LOC~~ SOLN: 3.0000 [IU] | Freq: Three times a day (TID) | SUBCUTANEOUS | Status: DC

## 2013-10-03 MED ORDER — INSULIN ASPART 100 UNIT/ML ~~LOC~~ SOLN
0.0000 [IU] | Freq: Three times a day (TID) | SUBCUTANEOUS | Status: DC
  Administered 2013-10-03 – 2013-10-04 (×2): 2 [IU] via SUBCUTANEOUS
  Administered 2013-10-04: 3 [IU] via SUBCUTANEOUS
  Administered 2013-10-04: 1 [IU] via SUBCUTANEOUS
  Administered 2013-10-05: 2 [IU] via SUBCUTANEOUS
  Administered 2013-10-06: 1 [IU] via SUBCUTANEOUS
  Administered 2013-10-07: 3 [IU] via SUBCUTANEOUS
  Administered 2013-10-07 – 2013-10-08 (×2): 2 [IU] via SUBCUTANEOUS
  Administered 2013-10-08 (×2): 3 [IU] via SUBCUTANEOUS
  Administered 2013-10-09 (×2): 2 [IU] via SUBCUTANEOUS
  Administered 2013-10-10: 06:00:00 1 [IU] via SUBCUTANEOUS
  Administered 2013-10-11: 13:00:00 7 [IU] via SUBCUTANEOUS
  Administered 2013-10-11: 06:00:00 1 [IU] via SUBCUTANEOUS
  Administered 2013-10-11: 5 [IU] via SUBCUTANEOUS

## 2013-10-03 MED ORDER — HYDROMORPHONE HCL PF 1 MG/ML IJ SOLN
1.0000 mg | INTRAMUSCULAR | Status: DC | PRN
  Administered 2013-10-03 – 2013-10-06 (×9): 1 mg via INTRAVENOUS
  Filled 2013-10-03 (×10): qty 1

## 2013-10-03 MED ORDER — SENNOSIDES-DOCUSATE SODIUM 8.6-50 MG PO TABS
1.0000 | ORAL_TABLET | Freq: Two times a day (BID) | ORAL | Status: DC
  Administered 2013-10-03 – 2013-10-11 (×16): 1 via ORAL
  Filled 2013-10-03 (×18): qty 1
  Filled 2013-10-03: qty 2
  Filled 2013-10-03: qty 1

## 2013-10-03 MED ORDER — INSULIN DETEMIR 100 UNIT/ML ~~LOC~~ SOLN
12.0000 [IU] | Freq: Every day | SUBCUTANEOUS | Status: DC
  Administered 2013-10-03: 12 [IU] via SUBCUTANEOUS
  Filled 2013-10-03 (×4): qty 0.12

## 2013-10-03 NOTE — Progress Notes (Addendum)
Inpatient Diabetes Program Recommendations  AACE/ADA: New Consensus Statement on Inpatient Glycemic Control (2013)  Target Ranges:  Prepandial:   less than 140 mg/dL      Peak postprandial:   less than 180 mg/dL (1-2 hours)      Critically ill patients:  140 - 180 mg/dL     Results for TIJA, BISS (MRN [id number]) as of 10/03/2013 09:34  Ref. Range 10/02/2013 06:03 10/02/2013 06:21 10/02/2013 07:46 10/02/2013 08:21 10/02/2013 08:57 10/02/2013 09:25 10/02/2013 11:40 10/02/2013 16:44 10/02/2013 22:38  Glucose-Capillary Latest Range: 70-99 mg/dL 17 (LL) 169 (H) 49 (L) 47 (L) 43 (LL) 74 188 (H) 261 (H) 252 (H)    Results for DEMIYA, [name] [name] (MRN [id number]) as of 10/03/2013 09:34  Ref. Range 10/03/2013 00:04 10/03/2013 02:07 10/03/2013 07:38  Glucose-Capillary Latest Range: 70-99 mg/dL 276 (H) 258 (H) 213 (H)     **Note patient was admitted with rhabdomyolysis after fall at home.  Has history of DM and CKD.    **Per records, patient takes 70/30 insulin at home: 29 units in the AM with breakfast and 19 units in the PM with supper  **Note patient had extreme hypoglycemia on AM of 01/11.  This was likely due to the fact that patient received 19 units of 70/30 insulin at 2354 the night before the hypoglycemia occurred.  **Patient now having elevated CBGs >200 mg/dl.   **MD- Please consider the following to help with in-hospital glucose control:  1. Add Novolog Sensitive correction scale (SSI) tid ac + HS 2. Add basal insulin if needed- Levemir 13 units QHS (0.15 units/kg for basal dosing) (Levemir might be safer choice for this patient since she has severe CKD)   Will follow. Wyn Quaker RN, MSN, CDE Diabetes Coordinator Inpatient Diabetes Program Team Pager: [phone] (8a-10p)

## 2013-10-03 NOTE — Progress Notes (Signed)
Patients daughter "Corena Herter" would like to be called by attending Physician who see's Ms Hakim 10-03-13 in the AM her Phone number is [phone]. Thanks

## 2013-10-03 NOTE — Progress Notes (Signed)
[facility] PROGRESS NOTE  [name] [name] DTO:[id number] DOB: 11-09-1925 DOA: 10/01/2013 PCP: Ann Held, MD  Assessment/Plan: 1. Rhabdomyolysis - Nephro was informed of pt's condition [name] [name]). Recheck and follow CK levels. IVFs. Recheck BMP in am. PT/OT ordered to assess patient after fall. Suspect that new meds may have played a role (valium and dilaudid). 2. CKD stage 4 - at baseline, recheck in AM 3. Severe hypoglycemia - Resolved now. Pt family reports that she took her 70/30 insulin yesterday but was not able to eat anything. Now with hyperglycemia, Will restart basal insulin and SSI.   4. Dehydration - continue IVF hydration   Code Status: DNR  Family Communication: Family at bedside  Disposition Plan: Admit to inpatient    HPI/Subjective: Pt says she is feeling more comfortable   Objective: Filed Vitals:   10/03/13 0807  BP: 163/53  Pulse: 94  Temp: 98.9 F (37.2 C)  Resp: 20    Intake/Output Summary (Last 24 hours) at 10/03/13 1347 Last data filed at 10/03/13 0916  Gross per 24 hour  Intake    720 ml  Output      0 ml  Net    720 ml   Filed Weights   10/01/13 1901  Weight: 192 lb 1.6 oz (87.136 kg)    Exam: General: Awake, no apparent distress, speaking clearly  Cardiovascular: normal s1, s2 sounds  Respiratory: BBS clear  Abdomen: soft, nondistended, nontender  Musculoskeletal: no CCE, persistent back pain    Data Reviewed: Basic Metabolic Panel:  Recent Labs Lab 10/02/13 0350 10/03/13 0520  NA 142 141  K 4.6 5.3  CL 105 108  CO2 23 19  GLUCOSE <20* 255*  BUN 75* 66*  CREATININE 2.84* 2.49*  CALCIUM 9.0 8.0*   Liver Function Tests:  Recent Labs Lab 10/03/13 0520  AST 36  ALT 20  ALKPHOS 63  BILITOT 0.3  PROT 5.9*  ALBUMIN 2.5*   No results found for this basename: LIPASE, AMYLASE,  in the last 168 hours No results found for this basename: AMMONIA,  in the last 168 hours CBC:  Recent Labs Lab 10/03/13 0520  WBC  10.8*  HGB 10.1*  HCT 31.8*  MCV 98.5  PLT 307   Cardiac Enzymes:  Recent Labs Lab 10/02/13 0350 10/03/13 0520  CKTOTAL 1026* 1070*   BNP (last 3 results) No results found for this basename: PROBNP,  in the last 8760 hours CBG:  Recent Labs Lab 10/03/13 0004 10/03/13 0207 10/03/13 0738 10/03/13 1017 10/03/13 1311  GLUCAP 276* 258* 213* 203* 184*    No results found for this or any previous visit (from the past 240 hour(s)).   Studies: No results found.  Scheduled Meds: . amitriptyline  10 mg Oral QHS  . aspirin EC  81 mg Oral Daily  . atorvastatin  20 mg Oral q1800  . febuxostat  40 mg Oral QHS  . [START ON 10/04/2013] furosemide  40 mg Oral BID  . heparin  5,000 Units Subcutaneous Q8H  . lanthanum  500 mg Oral TID WC  . metoprolol succinate  12.5 mg Oral Daily  . omega-3 acid ethyl esters  1 g Oral Daily  . sodium bicarbonate  650 mg Oral BID  . vitamin B-12  1,000 mcg Oral Daily   Continuous Infusions: . sodium chloride 75 mL/hr at 10/02/13 1930   Principal Problem:   Rhabdomyolysis Active Problems:   CKD (chronic kidney disease) stage 4, GFR 15-29 ml/min   Hypoglycemia  [name] [name] [employer] [phone]. If 7PM-7AM, please contact night-coverage at [email or web address], password [facility] 10/03/2013, 1:47 PM  LOS: 2 days

## 2013-10-03 NOTE — Care Management Note (Signed)
   CARE MANAGEMENT NOTE 10/03/2013  Patient:  [name] [name], [name] [name]   Account Number:  [id number]  Date Initiated:  10/03/2013  Documentation initiated by:  Karsynn Deweese  Subjective/Objective Assessment:   Noted adm s/p fall, pt previously scheduled for surgery on Friday 10/07/2013.     Action/Plan:   CM will follow for progression and d/c planning.   Anticipated DC Date:     Anticipated DC Plan:           Choice offered to / List presented to:             Status of service:  In process, will continue to follow Medicare Important Message given?   (If response is "NO", the following Medicare IM given date fields will be blank) Date Medicare IM given:   Date Additional Medicare IM given:    Discharge Disposition:    Per UR Regulation:    If discussed at Long Length of Stay Meetings, dates discussed:    Comments:

## 2013-10-03 NOTE — Progress Notes (Signed)
Patient's daughter [name] [name] returned my call in follow-up to their evaluation for VP here Friday, 09/30/13.  Patient was to try the Dilaudid that Dr. Gladstone Lighter had prescribed to see if that medication managed her pain any better than the Oxycodone before we proceeded with vertebroplasty of her L2 fracture.  [name] [name] reported that patient took one Dilaudid tablet at 2:00pm Friday and another at 10:00pm.  [name] [name] didn't see her mother again until about 10:00am the next morning which is when she found the patient on the bedroom floor with a rocking chair on top of her.  [name] [name] estimates her mom was in this position 7-10 hours.  EMS [facility].  [name] [name] states there are no new fractures but that the incident "had a negative impact on mom's kidneys."  (Patient is stage 4 renal failure; not on dialysis)  She was transported to [facility] to better monitor her heart and lungs while they increased fluids to improve her kidney function.  Her blood sugar "is out of whack, too."  "So, no, mom didn't tolerate the new pain medication well at all!"  Dr. Vernard Gambles made aware of situation.  jkl

## 2013-10-03 NOTE — Progress Notes (Signed)
Physical Therapy Treatment Patient Details Name: MAKAYIA DUPLESSIS MRN: [id number] DOB: 05/13/1926 Today's Date: 10/03/2013 Time: 1245-8099 PT Time Calculation (min): 26 min  PT Assessment / Plan / Recommendation  History of Present Illness patient admitted s/p fall.  Patient with recent fall with L2 compression fx and h/o CKD.  Now with rhabdo   PT Comments   Patient making slow progress toward PT goals.  Pain limiting factor.   Follow Up Recommendations  CIR     Does the patient have the potential to tolerate intense rehabilitation     Barriers to Discharge        Equipment Recommendations  Rolling walker with 5" wheels;3in1 (PT)    Recommendations for Other Services Rehab consult  Frequency Min 3X/week   Progress towards PT Goals Progress towards PT goals: Progressing toward goals  Plan Current plan remains appropriate    Precautions / Restrictions Precautions Precautions: Fall Precaution Comments: Compression fracture - pain with mobility Restrictions Weight Bearing Restrictions: No   Pertinent Vitals/Pain Pain from compression fracture limiting mobility.    Mobility  Bed Mobility Overal bed mobility: Needs Assistance Bed Mobility: Rolling;Sidelying to Sit Rolling: Mod assist Sidelying to sit: Max assist;HOB elevated General bed mobility comments: Verbal cues for technique.  Patient reaching for PT to pull up to sitting.  Redirected patient to use rail and move through sidelying for less pain with transitions. Transfers Overall transfer level: Needs assistance Equipment used: Rolling walker (2 wheeled) Transfers: Sit to/from Stand Sit to Stand: Max assist;+2 physical assistance General transfer comment: Verbal cues for hand placement and technique.  Assist to rise to standing and for safety. Ambulation/Gait Ambulation/Gait assistance: Mod assist;+2 safety/equipment Ambulation Distance (Feet): 12 Feet Assistive device: Rolling walker (2 wheeled) Gait  Pattern/deviations: Step-to pattern;Decreased stance time - right;Decreased step length - left;Shuffle;Antalgic;Trunk flexed Gait velocity: Slow gait speed General Gait Details: Verbal cues for safe use of RW and to stand upright (as much as possible).      PT Goals (current goals can now be found in the care plan section)    Visit Information  Last PT Received On: 10/03/13 Assistance Needed: +2 PT/OT/SLP Co-Evaluation/Treatment: Yes Reason for Co-Treatment: For patient/therapist safety PT goals addressed during session: Mobility/safety with mobility;Proper use of DME History of Present Illness: patient admitted s/p fall.  Patient with recent fall with L2 compression fx and h/o CKD.  Now with rhabdo    Subjective Data      Cognition  Cognition Arousal/Alertness: Awake/alert Behavior During Therapy: WFL for tasks assessed/performed Overall Cognitive Status: Within Functional Limits for tasks assessed    Balance  Balance Overall balance assessment: Needs assistance Sitting-balance support: Single extremity supported;Feet supported Sitting balance-Leahy Scale: Poor Standing balance-Leahy Scale: Poor  End of Session PT - End of Session Equipment Utilized During Treatment: Gait belt Activity Tolerance: Patient limited by fatigue;Patient limited by pain Patient left: in chair;with call bell/phone within reach Nurse Communication: Mobility status   GP     Despina Pole 10/03/2013, 1:54 PM Carita Pian. Sanjuana Kava, [place] Pager [phone]

## 2013-10-03 NOTE — Progress Notes (Signed)
I tried contacting patient's daughter [name] [name] as requested but there was no answer at either phone number provided for cell or home.    Murvin Natal, MD

## 2013-10-03 NOTE — Evaluation (Signed)
Occupational Therapy Evaluation Patient Details Name: [name] [name] MRN: [id number] DOB: 09-Sep-1926 Today's Date: 10/03/2013 Time: 1415-1440 OT Time Calculation (min): 25 min  OT Assessment / Plan / Recommendation History of present illness patient admitted s/p fall.  Patient with recent fall with L2 compression fx and h/o CKD.  Now with rhabdo   Clinical Impression   Pt requires assist for LB ADL, toileting and mobility.  She is limited by weakness and back pain.  Pt is awaiting L2 kyphoplasty. Prior to admission, pt was living in a basement apartment within her daughter's home and ambulating with a cane.  She was independent in ADL and driving.  Pt is motivated to return to independence and will be a good rehab candidate.    OT Assessment  Patient needs continued OT Services    Follow Up Recommendations  CIR    Barriers to Discharge Decreased caregiver support daughter works  Materials engineer for Other Services Rehab consult  Frequency  Min 2X/week    Precautions / Restrictions Precautions Precautions: Fall;Back Precaution Comments: Compression fracture - pain with mobility Restrictions Weight Bearing Restrictions: No   Pertinent Vitals/Pain Back pain, did not rate, repositioned. VSS    ADL  Eating/Feeding: Independent Where Assessed - Eating/Feeding: Bed level Grooming: Teeth care;Wash/dry hands;Wash/dry face;Set up Where Assessed - Grooming: Supported sitting Upper Body Bathing: Minimal assistance Where Assessed - Upper Body Bathing: Unsupported sitting Lower Body Bathing: +1 Total assistance Where Assessed - Lower Body Bathing: Unsupported sitting;Supported sit to stand Upper Body Dressing: Minimal assistance Where Assessed - Upper Body Dressing: Unsupported sitting Lower Body Dressing: +1 Total assistance Where Assessed - Lower Body Dressing: Unsupported sitting;Supported sit to stand Toilet Transfer: +2 Total assistance Toilet  Transfer Method: Sit to stand Toileting - Clothing Manipulation and Hygiene: +1 Total assistance Where Assessed - Toileting Clothing Manipulation and Hygiene: Standing Equipment Used: Gait belt;Rolling walker Transfers/Ambulation Related to ADLs: +2 assist for safety with RW and chair following ADL Comments: Pt unable to reach feet or cross foot over knee to donn/doff socks.  Dependent on UEs for balance requiring assist for LB ADL.    OT Diagnosis: Generalized weakness;Acute pain  OT Problem List: Decreased strength;Decreased activity tolerance;Impaired balance (sitting and/or standing);Decreased knowledge of use of DME or AE;Decreased knowledge of precautions;Obesity;Pain OT Treatment Interventions: Self-care/ADL training;DME and/or AE instruction;Patient/family education;Balance training   OT Goals(Current goals can be found in the care plan section) Acute Rehab OT Goals Patient Stated Goal: go back home OT Goal Formulation: With patient Time For Goal Achievement: 10/17/13 Potential to Achieve Goals: Good ADL Goals Pt Will Perform Grooming: with supervision;standing Pt Will Perform Lower Body Bathing: with supervision;with adaptive equipment;sit to/from stand Pt Will Perform Lower Body Dressing: with supervision;with adaptive equipment;sit to/from stand Pt Will Transfer to Toilet: with supervision;ambulating;regular height toilet;bedside commode Pt Will Perform Toileting - Clothing Manipulation and hygiene: with supervision;sit to/from stand Additional ADL Goal #1: Pt will adhere to back safety during mobility and ADL with minimal verbal cues.  Visit Information  Last OT Received On: 10/03/13 Assistance Needed: +2 PT/OT/SLP Co-Evaluation/Treatment: Yes Reason for Co-Treatment: For patient/therapist safety PT goals addressed during session: Mobility/safety with mobility;Proper use of DME OT goals addressed during session: ADL's and self-care History of Present Illness: patient  admitted s/p fall.  Patient with recent fall with L2 compression fx and h/o CKD.  Now with rhabdo       Prior [place]  expects to be discharged to:: Private residence Living Arrangements: Children Available Help at Discharge: Family Type of Home: Apartment (lives in daughter's basement) Home Access: Ramped entrance [place]: One [place]: Environmental consultant - 2 wheels;Cane - single point Prior Function Level of Independence: Independent with assistive device(s) Comments: used cane outside of home until 2 weeks prior to admission when she began using walker Communication Communication: No difficulties Dominant Hand: Left         Vision/Perception Vision - History Patient Visual Report: No change from baseline   Cognition  Cognition Arousal/Alertness: Awake/alert Behavior During Therapy: WFL for tasks assessed/performed Overall Cognitive Status: Within Functional Limits for tasks assessed    Extremity/Trunk Assessment Upper Extremity Assessment Upper Extremity Assessment: Generalized weakness Lower Extremity Assessment Lower Extremity Assessment: Defer to PT evaluation Cervical / Trunk Assessment Cervical / Trunk Assessment: Kyphotic     Mobility Bed Mobility Overal bed mobility: Needs Assistance Bed Mobility: Rolling;Sidelying to Sit Rolling: Mod assist Sidelying to sit: Max assist;HOB elevated General bed mobility comments: Verbal cues for technique.  Patient reaching for PT to pull up to sitting.  Redirected patient to use rail and move through sidelying for less pain with transitions. Transfers Overall transfer level: Needs assistance Equipment used: Rolling walker (2 wheeled) Transfers: Sit to/from Stand Sit to Stand: Max assist;+2 physical assistance General transfer comment: Verbal cues for hand placement and technique.  Assist to rise to standing and for safety.     Exercise     Balance Balance Overall balance  assessment: Needs assistance Sitting-balance support: Single extremity supported;Feet supported Sitting balance-Leahy Scale: Poor Standing balance-Leahy Scale: Poor   End of Session OT - End of Session Activity Tolerance: Patient limited by pain;Patient limited by fatigue Patient left: in chair;with call bell/phone within reach Nurse Communication: Mobility status  GO     Malka So 10/03/2013, 2:16 PM [phone]

## 2013-10-03 NOTE — Progress Notes (Signed)
Rehab Admissions Coordinator Note:  Patient was screened by Cleatrice Burke for appropriateness for an Inpatient Acute Rehab Consult.  At this time, we are recommending Inpatient Rehab consult.  Cleatrice Burke 10/03/2013, 9:13 PM  I can be reached at [phone].

## 2013-10-04 DIAGNOSIS — N390 Urinary tract infection, site not specified: Secondary | ICD-10-CM | POA: Diagnosis present

## 2013-10-04 DIAGNOSIS — M6282 Rhabdomyolysis: Secondary | ICD-10-CM

## 2013-10-04 DIAGNOSIS — E875 Hyperkalemia: Secondary | ICD-10-CM | POA: Diagnosis not present

## 2013-10-04 DIAGNOSIS — W19XXXA Unspecified fall, initial encounter: Secondary | ICD-10-CM

## 2013-10-04 DIAGNOSIS — S32009A Unspecified fracture of unspecified lumbar vertebra, initial encounter for closed fracture: Secondary | ICD-10-CM

## 2013-10-04 LAB — URINALYSIS, ROUTINE W REFLEX MICROSCOPIC
Bilirubin Urine: NEGATIVE
Glucose, UA: 100 mg/dL — AB
KETONES UR: NEGATIVE mg/dL
NITRITE: NEGATIVE
PROTEIN: 100 mg/dL — AB
Specific Gravity, Urine: 1.017 (ref 1.005–1.030)
UROBILINOGEN UA: 1 mg/dL (ref 0.0–1.0)
pH: 5.5 (ref 5.0–8.0)

## 2013-10-04 LAB — GLUCOSE, CAPILLARY
GLUCOSE-CAPILLARY: 205 mg/dL — AB (ref 70–99)
Glucose-Capillary: 231 mg/dL — ABNORMAL HIGH (ref 70–99)
Glucose-Capillary: 196 mg/dL — ABNORMAL HIGH (ref 70–99)
Glucose-Capillary: 149 mg/dL — ABNORMAL HIGH (ref 70–99)
Glucose-Capillary: 112 mg/dL — ABNORMAL HIGH (ref 70–99)

## 2013-10-04 LAB — URINE MICROSCOPIC-ADD ON

## 2013-10-04 LAB — BASIC METABOLIC PANEL
BUN: 66 mg/dL — ABNORMAL HIGH (ref 6–23)
BUN: 65 mg/dL — AB (ref 6–23)
CHLORIDE: 109 meq/L (ref 96–112)
CO2: 18 mEq/L — ABNORMAL LOW (ref 19–32)
CO2: 17 meq/L — AB (ref 19–32)
Calcium: 7.6 mg/dL — ABNORMAL LOW (ref 8.4–10.5)
Calcium: 7.7 mg/dL — ABNORMAL LOW (ref 8.4–10.5)
Chloride: 110 mEq/L (ref 96–112)
Creatinine, Ser: 2.78 mg/dL — ABNORMAL HIGH (ref 0.50–1.10)
Creatinine, Ser: 2.82 mg/dL — ABNORMAL HIGH (ref 0.50–1.10)
GFR calc Af Amer: 17 mL/min — ABNORMAL LOW (ref >90)
GFR calc non Af Amer: 14 mL/min — ABNORMAL LOW (ref >90)
GFR, EST AFRICAN AMERICAN: 17 mL/min — AB (ref >90)
GFR, EST NON AFRICAN AMERICAN: 14 mL/min — AB (ref >90)
Glucose, Bld: 210 mg/dL — ABNORMAL HIGH (ref 70–99)
Glucose, Bld: 219 mg/dL — ABNORMAL HIGH (ref 70–99)
POTASSIUM: 6.1 meq/L — AB (ref 3.7–5.3)
Potassium: 5.7 mEq/L — ABNORMAL HIGH (ref 3.7–5.3)
SODIUM: 141 meq/L (ref 137–147)
Sodium: 140 mEq/L (ref 137–147)

## 2013-10-04 LAB — POTASSIUM: POTASSIUM: 5.2 meq/L (ref 3.7–5.3)

## 2013-10-04 LAB — CK: CK TOTAL: 495 U/L — AB (ref 7–177)

## 2013-10-04 MED ORDER — POLYETHYLENE GLYCOL 3350 17 G PO PACK
17.0000 g | PACK | Freq: Every day | ORAL | Status: DC
  Administered 2013-10-04 – 2013-10-10 (×7): 17 g via ORAL
  Filled 2013-10-04 (×8): qty 1

## 2013-10-04 MED ORDER — SODIUM POLYSTYRENE SULFONATE 15 GM/60ML PO SUSP: 30.0000 g | Freq: Once | ORAL | Status: DC

## 2013-10-04 MED ORDER — ASPIRIN 81 MG PO CHEW: 81.0000 mg | CHEWABLE_TABLET | Freq: Once | ORAL | Status: DC

## 2013-10-04 MED ORDER — DEXTROSE 5 % IV SOLN
1.0000 g | Freq: Every day | INTRAVENOUS | Status: DC
  Administered 2013-10-04 – 2013-10-06 (×4): 1 g via INTRAVENOUS
  Filled 2013-10-04 (×6): qty 10

## 2013-10-04 MED ORDER — SODIUM POLYSTYRENE SULFONATE 15 GM/60ML PO SUSP
30.0000 g | ORAL | Status: AC
  Administered 2013-10-04: 30 g via ORAL
  Filled 2013-10-04: qty 120

## 2013-10-04 MED ORDER — INSULIN ASPART 100 UNIT/ML ~~LOC~~ SOLN
5.0000 [IU] | Freq: Three times a day (TID) | SUBCUTANEOUS | Status: DC
  Administered 2013-10-04 – 2013-10-11 (×11): 5 [IU] via SUBCUTANEOUS

## 2013-10-04 MED ORDER — DEXTROSE 50 % IV SOLN
INTRAVENOUS | Status: AC
  Filled 2013-10-04: qty 50

## 2013-10-04 MED ORDER — FUROSEMIDE 10 MG/ML IJ SOLN
20.0000 mg | INTRAMUSCULAR | Status: AC
  Administered 2013-10-04: 20 mg via INTRAVENOUS
  Filled 2013-10-04: qty 2

## 2013-10-04 MED ORDER — SODIUM CHLORIDE 0.9 % IV BOLUS (SEPSIS)
250.0000 mL | Freq: Once | INTRAVENOUS | Status: AC
  Administered 2013-10-04: 250 mL via INTRAVENOUS

## 2013-10-04 MED ORDER — INSULIN DETEMIR 100 UNIT/ML ~~LOC~~ SOLN
15.0000 [IU] | Freq: Every day | SUBCUTANEOUS | Status: DC
  Administered 2013-10-04 – 2013-10-05 (×2): 15 [IU] via SUBCUTANEOUS
  Filled 2013-10-04 (×4): qty 0.15

## 2013-10-04 NOTE — Consult Note (Signed)
Physical Medicine and Rehabilitation Consult  Reason for Consult:. Weakness due to rhabdomyolysis complicated by recent  L 2 compression fracture/back pain.  Referring Physician: Dr. Wynetta Emery    HPI: [name] [name] is a 80 y.o. female with history of DM, HTN, CKD, who sustained a fall 10/01/13 and laid on the floor for 7-10 hours. She was admitted on 10/02/13 via RH with acute on chronic renal failure due to rhabdomyolysis as well as severe hypoglycemia.  Patient with recent L2 compression fracture and had been started on dilaudid and valium for pain management.  She was treated with IV fluid with improvement. IVR consulted and plans on moving up kyphoplasty to help with pain management. Therapies initiated and CIR recommended by MD and therapy team.    Review of Systems  HENT: Negative for hearing loss.   Eyes: Negative for blurred vision and double vision.  Respiratory: Positive for shortness of breath. Negative for cough.   Cardiovascular: Negative for chest pain and palpitations.  Gastrointestinal: Negative for heartburn and nausea.  Genitourinary: Negative for urgency and frequency.  Musculoskeletal: Positive for myalgias.  Neurological: Positive for tingling (bilateral knees since TKR). Negative for dizziness and headaches.  Psychiatric/Behavioral: The patient has insomnia (nocturia 2-3 x).     Past Medical History  Diagnosis Date  . Diabetes mellitus     mellitus? x25 years  . HTN (hypertension)     x2 years  . Hypercholesterolemia     x 2 years  . Renal insufficiency     mild to moderate  . Anemia     chronic anemia due to chronic disease  . CAD (coronary artery disease)     nonobstructive   . GERD (gastroesophageal reflux disease)    Past Surgical History  Procedure Laterality Date  . Total knee arthroplasty      L-2006; R-2008   . Appendectomy    . Back surgery      lower back  . Transthoracic echocardiogram  05/2011  . Av fistula placement  07/07/2012     Procedure: ARTERIOVENOUS (AV) FISTULA CREATION;  Surgeon: Mal Misty, MD;  Location: [facility] OR;  Service: Vascular;  Laterality: Right;  Brachial - cephalic arterivenous fistula   Family History  Problem Relation Age of Onset  . Heart attack Father    Social History:   Retired- used to work for [employer].  Independent with cane prior to few weeks ago--cooks, cleans and was driving till 3 weeks ago. Has been using walker due to back pain. Per reports that she has never smoked. She has never used smokeless tobacco. She reports that she does not drink alcohol or use illicit drugs.  Allergies: No Known Allergies  Medications Prior to Admission  Medication Sig Dispense Refill  . amitriptyline (ELAVIL) 10 MG tablet Take 10 mg by mouth at bedtime.       [place] Kitchen aspirin EC 81 MG tablet Take 81 mg by mouth daily.      [place] Kitchen atorvastatin (LIPITOR) 20 MG tablet Take 20 mg by mouth daily.      . Calcium-Magnesium-Vitamin D (CALCIUM 500 PO) Take 500 mg by mouth daily.      [place] Kitchen CINNAMON PO Take 2 tablets by mouth daily.      . diazepam (VALIUM) 5 MG tablet Take 5 mg by mouth every 12 (twelve) hours as needed for anxiety.      . febuxostat (ULORIC) 40 MG tablet Take 40 mg by mouth at bedtime.      [place] Kitchen  fish oil-omega-3 fatty acids 1000 MG capsule Take 1 g by mouth daily.       . furosemide (LASIX) 40 MG tablet Take 40 mg by mouth 2 (two) times daily.       [place] Kitchen GARLIC PO Take 1 tablet by mouth daily.      . Glucosamine-Chondroitin 250-200 MG CAPS Take 1 capsule by mouth daily.        [place] Kitchen HYDROmorphone (DILAUDID) 4 MG tablet Take 4 mg by mouth every 4 (four) hours as needed for severe pain.      [place] Kitchen insulin aspart protamine- aspart (NOVOLOG MIX 70/30) (70-30) 100 UNIT/ML injection Inject 19-29 Units into the skin 2 (two) times daily with a meal. Uses 29 units in the morning and 19 units at night      . lanthanum (FOSRENOL) 500 MG chewable tablet Chew 500 mg by mouth 3 (three) times daily with meals.      . metoprolol  succinate (TOPROL-XL) 25 MG 24 hr tablet Take 12.5 mg by mouth daily.       . Misc Natural Products (OSTEO BI-FLEX TRIPLE STRENGTH PO) Take 1 tablet by mouth daily.      . nateglinide (STARLIX) 60 MG tablet Take 60 mg by mouth every morning. Takes prior to large meal      . sodium bicarbonate 650 MG tablet Take 650 mg by mouth 2 (two) times daily.      . traMADol (ULTRAM-ER) 100 MG 24 hr tablet Take 100-200 mg by mouth daily as needed for pain.      . vitamin B-12 (CYANOCOBALAMIN) 1000 MCG tablet Take 1,000 mcg by mouth daily.        Home: Home Living Family/patient expects to be discharged to:: Private residence Living Arrangements: Children Available Help at Discharge: Family Type of Home: Apartment (lives in daughter's basement) Home Access: Ramped entrance [place]: One [place]: Environmental consultant - 2 wheels;Cane - single point  Functional History: Prior Function Comments: used cane outside of home until 2 weeks prior to admission when she began using walker Functional Status:  Mobility:     Ambulation/Gait Ambulation Distance (Feet): 12 Feet Gait velocity: Slow gait speed General Gait Details: Verbal cues for safe use of RW and to stand upright (as much as possible).    ADL: ADL Eating/Feeding: Independent Where Assessed - Eating/Feeding: Bed level Grooming: Teeth care;Wash/dry hands;Wash/dry face;Set up Where Assessed - Grooming: Supported sitting Upper Body Bathing: Minimal assistance Where Assessed - Upper Body Bathing: Unsupported sitting Lower Body Bathing: +1 Total assistance Where Assessed - Lower Body Bathing: Unsupported sitting;Supported sit to stand Upper Body Dressing: Minimal assistance Where Assessed - Upper Body Dressing: Unsupported sitting Lower Body Dressing: +1 Total assistance Where Assessed - Lower Body Dressing: Unsupported sitting;Supported sit to stand Toilet Transfer: +2 Total assistance Toilet Transfer Method: Sit to stand Equipment  Used: Gait belt;Rolling walker Transfers/Ambulation Related to ADLs: +2 assist for safety with RW and chair following ADL Comments: Pt unable to reach feet or cross foot over knee to donn/doff socks.  Dependent on UEs for balance requiring assist for LB ADL.  Cognition: Cognition Overall Cognitive Status: Within Functional Limits for tasks assessed Cognition Arousal/Alertness: Awake/alert Behavior During Therapy: WFL for tasks assessed/performed Overall Cognitive Status: Within Functional Limits for tasks assessed Area of Impairment: Safety/judgement Safety/Judgement: Decreased awareness of safety General Comments: attempted OOB while PT unplugging IV; trying to stand with feet over to left, versus feet "underneath" her  Blood pressure 154/57, pulse 81, temperature  98.2 F (36.8 C), temperature source Oral, resp. rate 16, height 5\' 1"  (1.549 m), weight 90.1 kg (198 lb 10.2 oz), SpO2 96.00%. Physical Exam  Nursing note and vitals reviewed. Constitutional: She is oriented to person, place, and time. She appears well-developed and well-nourished.  Morbidly obese female. room smells of foul urine  HENT:  Head: Normocephalic and atraumatic.  Eyes: Conjunctivae are normal. Pupils are equal, round, and reactive to light.  Neck: Normal range of motion.  Cardiovascular: Normal rate and regular rhythm.   Murmur heard. Respiratory: Breath sounds normal. No respiratory distress (dyspnea with exertion). She has no wheezes.  GI: Soft. Bowel sounds are normal. She exhibits no distension. There is tenderness.  Musculoskeletal: She exhibits edema.  Low/mid back tender  Neurological: She is alert and oriented to person, place, and time.  Reasonable insight and awareness. UE grossly 4+/5. LE 2/5 HF, 2+ KE, 4/5 ADF and APF.  Skin: Skin is warm and dry. There is erythema (left shin).  Psychiatric: She has a normal mood and affect. Her behavior is normal. Judgment and thought content normal.     Results for orders placed during the hospital encounter of 10/01/13 (from the past 24 hour(s))  GLUCOSE, CAPILLARY     Status: Abnormal   Collection Time    10/03/13 10:17 AM      Result Value Range   Glucose-Capillary 203 (*) 70 - 99 mg/dL  GLUCOSE, CAPILLARY     Status: Abnormal   Collection Time    10/03/13  1:11 PM      Result Value Range   Glucose-Capillary 184 (*) 70 - 99 mg/dL  GLUCOSE, CAPILLARY     Status: Abnormal   Collection Time    10/03/13  4:48 PM      Result Value Range   Glucose-Capillary 176 (*) 70 - 99 mg/dL  GLUCOSE, CAPILLARY     Status: Abnormal   Collection Time    10/03/13 10:53 PM      Result Value Range   Glucose-Capillary 223 (*) 70 - 99 mg/dL  URINALYSIS, ROUTINE W REFLEX MICROSCOPIC     Status: Abnormal   Collection Time    10/03/13 11:30 PM      Result Value Range   Color, Urine YELLOW  YELLOW   APPearance TURBID (*) CLEAR   Specific Gravity, Urine 1.017  1.005 - 1.030   pH 5.5  5.0 - 8.0   Glucose, UA 100 (*) NEGATIVE mg/dL   Hgb urine dipstick LARGE (*) NEGATIVE   Bilirubin Urine NEGATIVE  NEGATIVE   Ketones, ur NEGATIVE  NEGATIVE mg/dL   Protein, ur 100 (*) NEGATIVE mg/dL   Urobilinogen, UA 1.0  0.0 - 1.0 mg/dL   Nitrite NEGATIVE  NEGATIVE   Leukocytes, UA LARGE (*) NEGATIVE  URINE MICROSCOPIC-ADD ON     Status: Abnormal   Collection Time    10/03/13 11:30 PM      Result Value Range   Squamous Epithelial / LPF FEW (*) RARE   WBC, UA TOO NUMEROUS TO COUNT  <3 WBC/hpf   RBC / HPF 7-10  <3 RBC/hpf   Bacteria, UA MANY (*) RARE  GLUCOSE, CAPILLARY     Status: Abnormal   Collection Time    10/04/13  4:54 AM      Result Value Range   Glucose-Capillary 231 (*) 70 - 99 mg/dL  CK     Status: Abnormal   Collection Time    10/04/13  5:50 AM  Result Value Range   Total CK 495 (*) 7 - 177 U/L  BASIC METABOLIC PANEL     Status: Abnormal   Collection Time    10/04/13  5:50 AM      Result Value Range   Sodium 141  137 - 147 mEq/L    Potassium 5.7 (*) 3.7 - 5.3 mEq/L   Chloride 110  96 - 112 mEq/L   CO2 18 (*) 19 - 32 mEq/L   Glucose, Bld 210 (*) 70 - 99 mg/dL   BUN 66 (*) 6 - 23 mg/dL   Creatinine, Ser 2.78 (*) 0.50 - 1.10 mg/dL   Calcium 7.6 (*) 8.4 - 10.5 mg/dL   GFR calc non Af Amer 14 (*) >90 mL/min   GFR calc Af Amer 17 (*) >90 mL/min  GLUCOSE, CAPILLARY     Status: Abnormal   Collection Time    10/04/13  7:30 AM      Result Value Range   Glucose-Capillary 196 (*) 70 - 99 mg/dL   Comment 1 Notify RN     Comment 2 Documented in Chart     No results found.  Assessment/Plan: Diagnosis: L2 Fx, Rhabdomyolysis 1. Does the need for close, 24 hr/day medical supervision in concert with the patient's rehab needs make it unreasonable for this patient to be served in a less intensive setting? Yes 2. Co-Morbidities requiring supervision/potential complications: UTI, DM, obesity 3. Due to bladder management, bowel management, safety, skin/wound care, disease management, medication administration, pain management and patient education, does the patient require 24 hr/day rehab nursing? Yes 4. Does the patient require coordinated care of a physician, rehab nurse, PT (1-2 hrs/day, 5 days/week) and OT (1-2 hrs/day, 5 days/week) to address physical and functional deficits in the context of the above medical diagnosis(es)? Yes Addressing deficits in the following areas: balance, endurance, locomotion, strength, transferring, bowel/bladder control, bathing, dressing, feeding, grooming, toileting and psychosocial support 5. Can the patient actively participate in an intensive therapy program of at least 3 hrs of therapy per day at least 5 days per week? Yes 6. The potential for patient to make measurable gains while on inpatient rehab is good 7. Anticipated functional outcomes upon discharge from inpatient rehab are mod I to min assist with PT, mod I to min assist with OT, n/a with SLP. 8. Estimated rehab length of stay to reach the  above functional goals is: 12-15 days 9. Does the patient have adequate social supports to accommodate these discharge functional goals? Yes 10. Anticipated D/C setting: Home 11. Anticipated post D/C treatments: Alpha therapy 12. Overall Rehab/Functional Prognosis: excellent  RECOMMENDATIONS: This patient's condition is appropriate for continued rehabilitative care in the following setting: CIR Patient has agreed to participate in recommended program. Yes and Potentially Note that insurance prior authorization may be required for reimbursement for recommended care.  Comment: Need to follow up on social supports.   Meredith Staggers, MD, [facility]     10/04/2013

## 2013-10-04 NOTE — Progress Notes (Signed)
Patient ID: [name] [name], female   DOB: 06-12-1926, 80 y.o.   MRN: [id number]    Request for L2 kyphoplasty.  Pt was scheduled as OP on Fri 1/9 Became inpt for pain control  We are aware of pt Plan: scheduled for L2 KP 1/15 IR PA will see pt asap Orders will be placed accordingly

## 2013-10-04 NOTE — Progress Notes (Signed)
[facility] PROGRESS NOTE  TUWANNA [name] ZHG:[id number] DOB: 12-09-1925 DOA: 10/01/2013 PCP: Ann Held, MD  Assessment/Plan: 1. Rhabdomyolysis - CK levels improving. Nephro was informed of pt's condition [name]).  IVFs. Recheck BMP in am. PT/OT ordered to assess patient after fall. Recommending CIR.  CKD stage 4 - at baseline, recheck in AM 2. Severe hypoglycemia - Resolved now. Pt family reports that she took her 70/30 insulin yesterday but was not able to eat anything. Now with hyperglycemia, Will restart basal insulin and SSI.  3. Hyperglycemia - restarted basal bolus insulin, monitoring bS closely. Following.  4. Dehydration - continue IVF hydration  5. UTI - started on ceftriaxone IV, pending urine cultures 6. Constipation - adding miralax today.  7. Back Pain - Pt was scheduled to have a kyphoplasty in 3 days, will ask IR to eval in hospital as pt still having significant back pain.  8. Hyperkalemia - ? If this is hemolysis, will give lasix 20 mg IV x1 now, repeat BMP at 2 pm and in AM.   Code Status: DNR  Family Communication: Family at bedside  Disposition Plan: to CIR when medically stable    HPI/Subjective: Pt reports having back pain overnight and not able to sleep well   Objective: Filed Vitals:   10/04/13 0748  BP: 154/57  Pulse: 81  Temp: 98.2 F (36.8 C)  Resp: 16    Intake/Output Summary (Last 24 hours) at 10/04/13 0900 Last data filed at 10/03/13 1335  Gross per 24 hour  Intake    120 ml  Output      0 ml  Net    120 ml   Filed Weights   10/01/13 1901 10/03/13 2103  Weight: 192 lb 1.6 oz (87.136 kg) 198 lb 10.2 oz (90.1 kg)    Exam:  General: Awake, no apparent distress, speaking clearly  Cardiovascular: normal s1, s2 sounds  Respiratory: BBS clear  Abdomen: soft, nondistended, nontender  Musculoskeletal: no CCE, persistent back pain with palpation   Data Reviewed: Basic Metabolic Panel:  Recent Labs Lab 10/02/13 0350  10/03/13 0520 10/04/13 0550  NA 142 141 141  K 4.6 5.3 5.7*  CL 105 108 110  CO2 23 19 18*  GLUCOSE <20* 255* 210*  BUN 75* 66* 66*  CREATININE 2.84* 2.49* 2.78*  CALCIUM 9.0 8.0* 7.6*   Liver Function Tests:  Recent Labs Lab 10/03/13 0520  AST 36  ALT 20  ALKPHOS 63  BILITOT 0.3  PROT 5.9*  ALBUMIN 2.5*   No results found for this basename: LIPASE, AMYLASE,  in the last 168 hours No results found for this basename: AMMONIA,  in the last 168 hours CBC:  Recent Labs Lab 10/03/13 0520  WBC 10.8*  HGB 10.1*  HCT 31.8*  MCV 98.5  PLT 307   Cardiac Enzymes:  Recent Labs Lab 10/02/13 0350 10/03/13 0520 10/04/13 0550  CKTOTAL 1026* 1070* 495*   BNP (last 3 results) No results found for this basename: PROBNP,  in the last 8760 hours CBG:  Recent Labs Lab 10/03/13 1311 10/03/13 1648 10/03/13 2253 10/04/13 0454 10/04/13 0730  GLUCAP 184* 176* 223* 231* 196*    No results found for this or any previous visit (from the past 240 hour(s)).   Studies: No results found.  Scheduled Meds: . amitriptyline  10 mg Oral QHS  . aspirin EC  81 mg Oral Daily  . atorvastatin  20 mg Oral q1800  . cefTRIAXone (ROCEPHIN)  IV  1 g  Intravenous QHS  . dextrose      . febuxostat  40 mg Oral QHS  . furosemide  20 mg Intravenous NOW  . furosemide  40 mg Oral BID  . heparin  5,000 Units Subcutaneous Q8H  . insulin aspart  0-9 Units Subcutaneous TID WC  . insulin aspart  5 Units Subcutaneous TID WC  . insulin detemir  15 Units Subcutaneous Daily  . lanthanum  500 mg Oral TID WC  . metoprolol succinate  12.5 mg Oral Daily  . omega-3 acid ethyl esters  1 g Oral Daily  . polyethylene glycol  17 g Oral Daily  . senna-docusate  1 tablet Oral BID  . sodium bicarbonate  650 mg Oral BID  . vitamin B-12  1,000 mcg Oral Daily   Continuous Infusions: . sodium chloride 75 mL/hr (10/03/13 2002)    Principal Problem:   Rhabdomyolysis Active Problems:   CKD (chronic kidney  disease) stage 4, GFR 15-29 ml/min   Hypoglycemia   UTI (urinary tract infection)   [facility] Pager [phone]. If 7PM-7AM, please contact night-coverage at [email or web address], password [facility] 10/04/2013, 9:00 AM  LOS: 3 days

## 2013-10-05 DIAGNOSIS — R011 Cardiac murmur, unspecified: Secondary | ICD-10-CM

## 2013-10-05 DIAGNOSIS — I421 Obstructive hypertrophic cardiomyopathy: Secondary | ICD-10-CM | POA: Diagnosis present

## 2013-10-05 DIAGNOSIS — E875 Hyperkalemia: Secondary | ICD-10-CM

## 2013-10-05 LAB — RENAL FUNCTION PANEL
ALBUMIN: 2.2 g/dL — AB (ref 3.5–5.2)
BUN: 61 mg/dL — ABNORMAL HIGH (ref 6–23)
CHLORIDE: 111 meq/L (ref 96–112)
CO2: 19 meq/L (ref 19–32)
Calcium: 7.6 mg/dL — ABNORMAL LOW (ref 8.4–10.5)
Creatinine, Ser: 2.9 mg/dL — ABNORMAL HIGH (ref 0.50–1.10)
GFR calc non Af Amer: 14 mL/min — ABNORMAL LOW (ref >90)
GFR, EST AFRICAN AMERICAN: 16 mL/min — AB (ref >90)
Glucose, Bld: 113 mg/dL — ABNORMAL HIGH (ref 70–99)
Phosphorus: 4.3 mg/dL (ref 2.3–4.6)
Potassium: 4.8 mEq/L (ref 3.7–5.3)
SODIUM: 144 meq/L (ref 137–147)

## 2013-10-05 LAB — CBC
HEMATOCRIT: 29.7 % — AB (ref 36.0–46.0)
Hemoglobin: 9.1 g/dL — ABNORMAL LOW (ref 12.0–15.0)
MCH: 31.2 pg (ref 26.0–34.0)
MCHC: 30.6 g/dL (ref 30.0–36.0)
MCV: 101.7 fL — AB (ref 78.0–100.0)
PLATELETS: 259 10*3/uL (ref 150–400)
RBC: 2.92 MIL/uL — AB (ref 3.87–5.11)
RDW: 16.2 % — ABNORMAL HIGH (ref 11.5–15.5)
WBC: 8.9 10*3/uL (ref 4.0–10.5)

## 2013-10-05 LAB — GLUCOSE, CAPILLARY
GLUCOSE-CAPILLARY: 117 mg/dL — AB (ref 70–99)
Glucose-Capillary: 105 mg/dL — ABNORMAL HIGH (ref 70–99)
Glucose-Capillary: 171 mg/dL — ABNORMAL HIGH (ref 70–99)
Glucose-Capillary: 109 mg/dL — ABNORMAL HIGH (ref 70–99)

## 2013-10-05 MED ORDER — CEFAZOLIN SODIUM-DEXTROSE 2-3 GM-% IV SOLR
2.0000 g | Freq: Once | INTRAVENOUS | Status: AC
  Administered 2013-10-06: 2 g via INTRAVENOUS
  Filled 2013-10-05 (×2): qty 50

## 2013-10-05 NOTE — Progress Notes (Signed)
OT Cancellation Note  Patient Details Name: DELAINA FETSCH MRN: [id number] DOB: 1926-01-16   Cancelled Treatment:    Reason Eval/Treat Not Completed: Pain limiting ability to participate. Pt reports "15"/10 back pain and declined OT. Pt just received pain meds prior to OT entering room. Pt continues to await kyphoplasty. Will re attempt OT later today as time allows  Britt Bottom 10/05/2013, 11:41 AM

## 2013-10-05 NOTE — Progress Notes (Signed)
OT Cancellation Note  Patient Details Name: [name] [name] MRN: [id number] DOB: 09-17-1926   Cancelled Treatment:    Reason Eval/Treat Not Completed: Patient declined, reports 8/10 back  Pain. Pain limiting ability to participate. Pt states that she will be having kyphoplasty tomorrow. Will continue to follow  Britt Bottom 10/05/2013, 1:10 PM

## 2013-10-05 NOTE — Progress Notes (Signed)
Physical Therapy Treatment Patient Details Name: [name] [name] MRN: [id number] DOB: 1926/08/27 Today's Date: 10/05/2013 Time: 5038-8828 PT Time Calculation (min): 12 min  PT Assessment / Plan / Recommendation  History of Present Illness patient admitted s/p fall.  Patient with recent fall with L2 compression fx and h/o CKD.  Now with rhabdo   PT Comments   Patient eager for procedure to relieve pain hopefully.  Willing to participate in LE exercises in bed, but limited out of bed mobility due to pain.  Feel she may benefit from CIR level therapies if participation improves after procedure.  Follow Up Recommendations  CIR     Does the patient have the potential to tolerate intense rehabilitation   maybe  Barriers to Discharge  None      Equipment Recommendations  Rolling walker with 5" wheels;3in1 (PT)    Recommendations for Other Services  None  Frequency Min 3X/week   Progress towards PT Goals Progress towards PT goals: Not progressing toward goals - comment (limited due to pain)  Plan Current plan remains appropriate    Precautions / Restrictions Precautions Precautions: Fall;Back Precaution Comments: Compression fracture - pain with mobility   Pertinent Vitals/Pain Minimal pain complaints with repositioning in bed    Mobility  Bed Mobility Rolling: Min assist General bed mobility comments: rolled in bed for positioning with rail to assist and assist to scoot hips; pt refused out of bed or edge of bed due to pain and wanting to wait till after procedure tomorrow    Exercises General Exercises - Lower Extremity Ankle Circles/Pumps: AROM;Both;20 reps;Supine Gluteal Sets: AROM;Both;10 reps;Supine Short Arc Quad: AROM;Both;Supine;15 reps Heel Slides: AROM;Both;Supine;15 reps Hip ABduction/ADduction: AROM;Both;15 reps;Supine     PT Goals (current goals can now be found in the care plan section)    Visit Information  Last PT Received On: 10/05/13 Assistance Needed:  +1 History of Present Illness: patient admitted s/p fall.  Patient with recent fall with L2 compression fx and h/o CKD.  Now with rhabdo    Subjective Data      Cognition  Cognition Arousal/Alertness: Lethargic;Suspect due to medications Behavior During Therapy: [facility] for tasks assessed/performed Overall Cognitive Status: Within Functional Limits for tasks assessed    Balance     End of Session PT - End of Session Activity Tolerance: Patient limited by pain;Patient limited by fatigue Patient left: in bed;with call bell/phone within reach   GP     [facility] 10/05/2013, 4:22 PM [place], [place] 10/05/2013

## 2013-10-05 NOTE — Progress Notes (Signed)
I had received a message to call daughter [name] at [phone].  I called the number and there was no answer.  After multiple rings it went to voicemail and I left a message.   Murvin Natal, MD

## 2013-10-05 NOTE — Progress Notes (Signed)
[facility] PROGRESS NOTE  NAVIE LAMOREAUX ZOX:[id number] DOB: July 25, 1926 DOA: 10/01/2013 PCP: Ann Held, MD  Assessment/Plan: 1. Rhabdomyolysis - CK levels improving. Nephro was informed of pt's condition [name]). IVFs. Recheck BMP in am. PT/OT ordered to assess patient after fall. Recommending CIR. CKD stage 4 - at baseline, recheck in AM. CIR consulted and following.  2. Severe hypoglycemia - Resolved now. Pt family reports that she took her 70/30 insulin yesterday but was not able to eat anything. Now with hyperglycemia, Will restart basal insulin and SSI.  3. Hyperglycemia - restarted basal bolus insulin, monitoring bS closely. Much better controlled. Following.  4. Dehydration - continue IVF hydration  5. UTI - started on ceftriaxone IV, pending urine cultures 6. Constipation - adding miralax today.  7. Back Pain - Pt was scheduled to have a kyphoplasty in 3 days, will ask IR to eval in hospital as pt still having significant back pain. Pt is scheduled to have her kyphoplasty tomorrow 1/15.  8. Hyperkalemia - treated with kayexalate and much better now.  9. Hypertrophic cardiomyopathy - Pt did have a 4 second pause (asymptomatic) last night.  Will continue her beta blockers and continue to monitor, if there is a recurrence will consult cards, She has seen Dr. Burt Knack.  Also will check Echocardiogram as her murmur sounds more pronounced today. It may be just because she is sitting up in a chair, her last echo was 2 years ago.   Code Status: DNR  Family Communication: Family at bedside  Disposition Plan: to CIR when medically stable   HPI/Subjective: Pt sitting up in chair.  She says that her back hurts.     Objective: Filed Vitals:   10/05/13 0421  BP: 154/60  Pulse: 83  Temp: 98.6 F (37 C)  Resp: 16    Intake/Output Summary (Last 24 hours) at 10/05/13 0929 Last data filed at 10/05/13 0422  Gross per 24 hour  Intake 1922.5 ml  Output    100 ml  Net 1822.5 ml    Filed Weights   10/01/13 1901 10/03/13 2103 10/04/13 2033  Weight: 192 lb 1.6 oz (87.136 kg) 198 lb 10.2 oz (90.1 kg) 198 lb 10.2 oz (90.101 kg)    Exam:  General: Awake, no apparent distress, speaking clearly  Cardiovascular: normal s1, s2 sounds with pronounced systolic murmur  Respiratory: BBS clear  Abdomen: soft, nondistended, nontender  Musculoskeletal: no CCE, persistent back pain with palpation  Data Reviewed: Basic Metabolic Panel:  Recent Labs Lab 10/02/13 0350 10/03/13 0520 10/04/13 0550 10/04/13 1350 10/04/13 2210 10/05/13 0400  NA 142 141 141 140  --  144  K 4.6 5.3 5.7* 6.1* 5.2 4.8  CL 105 108 110 109  --  111  CO2 23 19 18* 17*  --  19  GLUCOSE <20* 255* 210* 219*  --  113*  BUN 75* 66* 66* 65*  --  61*  CREATININE 2.84* 2.49* 2.78* 2.82*  --  2.90*  CALCIUM 9.0 8.0* 7.6* 7.7*  --  7.6*  PHOS  --   --   --   --   --  4.3   Liver Function Tests:  Recent Labs Lab 10/03/13 0520 10/05/13 0400  AST 36  --   ALT 20  --   ALKPHOS 63  --   BILITOT 0.3  --   PROT 5.9*  --   ALBUMIN 2.5* 2.2*   No results found for this basename: LIPASE, AMYLASE,  in the last 168 hours  No results found for this basename: AMMONIA,  in the last 168 hours CBC:  Recent Labs Lab 10/03/13 0520 10/05/13 0400  WBC 10.8* 8.9  HGB 10.1* 9.1*  HCT 31.8* 29.7*  MCV 98.5 101.7*  PLT 307 259   Cardiac Enzymes:  Recent Labs Lab 10/02/13 0350 10/03/13 0520 10/04/13 0550  CKTOTAL 1026* 1070* 495*   BNP (last 3 results) No results found for this basename: PROBNP,  in the last 8760 hours CBG:  Recent Labs Lab 10/04/13 0730 10/04/13 1138 10/04/13 1712 10/04/13 2036 10/05/13 0802  GLUCAP 196* 205* 149* 112* 105*    No results found for this or any previous visit (from the past 240 hour(s)).   Studies: No results found.  Scheduled Meds: . amitriptyline  10 mg Oral QHS  . aspirin EC  81 mg Oral Daily  . atorvastatin  20 mg Oral q1800  . cefTRIAXone  (ROCEPHIN)  IV  1 g Intravenous QHS  . febuxostat  40 mg Oral QHS  . furosemide  40 mg Oral BID  . heparin  5,000 Units Subcutaneous Q8H  . insulin aspart  0-9 Units Subcutaneous TID WC  . insulin aspart  5 Units Subcutaneous TID WC  . insulin detemir  15 Units Subcutaneous Daily  . lanthanum  500 mg Oral TID WC  . metoprolol succinate  12.5 mg Oral Daily  . omega-3 acid ethyl esters  1 g Oral Daily  . polyethylene glycol  17 g Oral Daily  . senna-docusate  1 tablet Oral BID  . sodium bicarbonate  650 mg Oral BID  . vitamin B-12  1,000 mcg Oral Daily   Continuous Infusions: . sodium chloride 75 mL/hr (10/03/13 2002)   Principal Problem:   Rhabdomyolysis Active Problems:   CKD (chronic kidney disease) stage 4, GFR 15-29 ml/min   Hypoglycemia   UTI (urinary tract infection)   Hyperkalemia  Rin Gorton [employer] Pager [phone]. If 7PM-7AM, please contact night-coverage at [email or web address], password [facility] 10/05/2013, 9:29 AM  LOS: 4 days

## 2013-10-05 NOTE — Progress Notes (Signed)
Call placed Kirby-graham, PA informed patient had a 4.72 sec pause, blood pressure 144/54, no c/o sob or chest pain..  New orders received.

## 2013-10-05 NOTE — H&P (Signed)
[name] [name] is an 80 y.o. female.   Chief Complaint: severe low back pain Worsening since Dec 2014 Was consulted with Dr Vernard Gambles last week for Lumbar 2 fracture and had intended to perform interventional procedure 09/30/2013 Pt has since been admitted to [facility] for pain control. Lumbar 2 kyphoplasty vs veterbroplasty has been rescheduled to 10/06/13  HPI: DM; HTN; HLD; renal insuff; CAD  Past Medical History  Diagnosis Date  . Diabetes mellitus     mellitus? x25 years  . HTN (hypertension)     x2 years  . Hypercholesterolemia     x 2 years  . Renal insufficiency     mild to moderate  . Anemia     chronic anemia due to chronic disease  . CAD (coronary artery disease)     nonobstructive   . GERD (gastroesophageal reflux disease)     Past Surgical History  Procedure Laterality Date  . Total knee arthroplasty      L-2006; R-2008   . Appendectomy    . Back surgery      lower back  . Transthoracic echocardiogram  05/2011  . Av fistula placement  07/07/2012    Procedure: ARTERIOVENOUS (AV) FISTULA CREATION;  Surgeon: Mal Misty, MD;  Location: [facility];  Service: Vascular;  Laterality: Right;  Brachial - cephalic arterivenous fistula    Family History  Problem Relation Age of Onset  . Heart attack Father    Social History:  reports that she has never smoked. She has never used smokeless tobacco. She reports that she does not drink alcohol or use illicit drugs.  Allergies: No Known Allergies  Medications Prior to Admission  Medication Sig Dispense Refill  . amitriptyline (ELAVIL) 10 MG tablet Take 10 mg by mouth at bedtime.       Marland Kitchen aspirin EC 81 MG tablet Take 81 mg by mouth daily.      Marland Kitchen atorvastatin (LIPITOR) 20 MG tablet Take 20 mg by mouth daily.      . Calcium-Magnesium-Vitamin D (CALCIUM 500 PO) Take 500 mg by mouth daily.      Marland Kitchen CINNAMON PO Take 2 tablets by mouth daily.      . diazepam (VALIUM) 5 MG tablet Take 5 mg by mouth every 12 (twelve) hours as  needed for anxiety.      . febuxostat (ULORIC) 40 MG tablet Take 40 mg by mouth at bedtime.      . fish oil-omega-3 fatty acids 1000 MG capsule Take 1 g by mouth daily.       . furosemide (LASIX) 40 MG tablet Take 40 mg by mouth 2 (two) times daily.       Marland Kitchen GARLIC PO Take 1 tablet by mouth daily.      . Glucosamine-Chondroitin 250-200 MG CAPS Take 1 capsule by mouth daily.        Marland Kitchen HYDROmorphone (DILAUDID) 4 MG tablet Take 4 mg by mouth every 4 (four) hours as needed for severe pain.      Marland Kitchen insulin aspart protamine- aspart (NOVOLOG MIX 70/30) (70-30) 100 UNIT/ML injection Inject 19-29 Units into the skin 2 (two) times daily with a meal. Uses 29 units in the morning and 19 units at night      . lanthanum (FOSRENOL) 500 MG chewable tablet Chew 500 mg by mouth 3 (three) times daily with meals.      . metoprolol succinate (TOPROL-XL) 25 MG 24 hr tablet Take 12.5 mg by mouth daily.       Marland Kitchen  Misc Natural Products (OSTEO BI-FLEX TRIPLE STRENGTH PO) Take 1 tablet by mouth daily.      . nateglinide (STARLIX) 60 MG tablet Take 60 mg by mouth every morning. Takes prior to large meal      . sodium bicarbonate 650 MG tablet Take 650 mg by mouth 2 (two) times daily.      . traMADol (ULTRAM-ER) 100 MG 24 hr tablet Take 100-200 mg by mouth daily as needed for pain.      . vitamin B-12 (CYANOCOBALAMIN) 1000 MCG tablet Take 1,000 mcg by mouth daily.        Results for orders placed during the hospital encounter of 10/01/13 (from the past 48 hour(s))  GLUCOSE, CAPILLARY     Status: Abnormal   Collection Time    10/03/13  1:11 PM      Result Value Range   Glucose-Capillary 184 (*) 70 - 99 mg/dL  GLUCOSE, CAPILLARY     Status: Abnormal   Collection Time    10/03/13  4:48 PM      Result Value Range   Glucose-Capillary 176 (*) 70 - 99 mg/dL  GLUCOSE, CAPILLARY     Status: Abnormal   Collection Time    10/03/13 10:53 PM      Result Value Range   Glucose-Capillary 223 (*) 70 - 99 mg/dL  URINALYSIS, ROUTINE W  REFLEX MICROSCOPIC     Status: Abnormal   Collection Time    10/03/13 11:30 PM      Result Value Range   Color, Urine YELLOW  YELLOW   APPearance TURBID (*) CLEAR   Specific Gravity, Urine 1.017  1.005 - 1.030   pH 5.5  5.0 - 8.0   Glucose, UA 100 (*) NEGATIVE mg/dL   Hgb urine dipstick LARGE (*) NEGATIVE   Bilirubin Urine NEGATIVE  NEGATIVE   Ketones, ur NEGATIVE  NEGATIVE mg/dL   Protein, ur 100 (*) NEGATIVE mg/dL   Urobilinogen, UA 1.0  0.0 - 1.0 mg/dL   Nitrite NEGATIVE  NEGATIVE   Leukocytes, UA LARGE (*) NEGATIVE  URINE MICROSCOPIC-ADD ON     Status: Abnormal   Collection Time    10/03/13 11:30 PM      Result Value Range   Squamous Epithelial / LPF FEW (*) RARE   WBC, UA TOO NUMEROUS TO COUNT  <3 WBC/hpf   RBC / HPF 7-10  <3 RBC/hpf   Bacteria, UA MANY (*) RARE  URINE CULTURE     Status: None   Collection Time    10/03/13 11:30 PM      Result Value Range   Specimen Description URINE, RANDOM     Special Requests NONE     Culture  Setup Time       Value: 10/04/2013 01:10     Performed at [facility] Count       Value: >=100,000 COLONIES/ML     Performed at [facility]   Culture       Value: ESCHERICHIA COLI     Performed at [facility]   Report Status PENDING    GLUCOSE, CAPILLARY     Status: Abnormal   Collection Time    10/04/13  4:54 AM      Result Value Range   Glucose-Capillary 231 (*) 70 - 99 mg/dL  CK     Status: Abnormal   Collection Time    10/04/13  5:50 AM      Result Value Range  Total CK 495 (*) 7 - 177 U/L  BASIC METABOLIC PANEL     Status: Abnormal   Collection Time    10/04/13  5:50 AM      Result Value Range   Sodium 141  137 - 147 mEq/L   Potassium 5.7 (*) 3.7 - 5.3 mEq/L   Chloride 110  96 - 112 mEq/L   CO2 18 (*) 19 - 32 mEq/L   Glucose, Bld 210 (*) 70 - 99 mg/dL   BUN 66 (*) 6 - 23 mg/dL   Creatinine, Ser 2.17 (*) 0.50 - 1.10 mg/dL   Calcium 7.6 (*) 8.4 - 10.5 mg/dL   GFR calc non Af Amer 14  (*) >90 mL/min   GFR calc Af Amer 17 (*) >90 mL/min   Comment: (NOTE)     The eGFR has been calculated using the CKD EPI equation.     This calculation has not been validated in all clinical situations.     eGFR's persistently <90 mL/min signify possible Chronic Kidney     Disease.  GLUCOSE, CAPILLARY     Status: Abnormal   Collection Time    10/04/13  7:30 AM      Result Value Range   Glucose-Capillary 196 (*) 70 - 99 mg/dL   Comment 1 Notify RN     Comment 2 Documented in Chart    GLUCOSE, CAPILLARY     Status: Abnormal   Collection Time    10/04/13 11:38 AM      Result Value Range   Glucose-Capillary 205 (*) 70 - 99 mg/dL   Comment 1 Notify RN     Comment 2 Documented in Chart    BASIC METABOLIC PANEL     Status: Abnormal   Collection Time    10/04/13  1:50 PM      Result Value Range   Sodium 140  137 - 147 mEq/L   Potassium 6.1 (*) 3.7 - 5.3 mEq/L   Chloride 109  96 - 112 mEq/L   CO2 17 (*) 19 - 32 mEq/L   Glucose, Bld 219 (*) 70 - 99 mg/dL   BUN 65 (*) 6 - 23 mg/dL   Creatinine, Ser 9.81 (*) 0.50 - 1.10 mg/dL   Calcium 7.7 (*) 8.4 - 10.5 mg/dL   GFR calc non Af Amer 14 (*) >90 mL/min   GFR calc Af Amer 17 (*) >90 mL/min   Comment: (NOTE)     The eGFR has been calculated using the CKD EPI equation.     This calculation has not been validated in all clinical situations.     eGFR's persistently <90 mL/min signify possible Chronic Kidney     Disease.  GLUCOSE, CAPILLARY     Status: Abnormal   Collection Time    10/04/13  5:12 PM      Result Value Range   Glucose-Capillary 149 (*) 70 - 99 mg/dL   Comment 1 Notify RN     Comment 2 Documented in Chart    GLUCOSE, CAPILLARY     Status: Abnormal   Collection Time    10/04/13  8:36 PM      Result Value Range   Glucose-Capillary 112 (*) 70 - 99 mg/dL  POTASSIUM     Status: None   Collection Time    10/04/13 10:10 PM      Result Value Range   Potassium 5.2  3.7 - 5.3 mEq/L  CBC     Status: Abnormal   Collection Time  10/05/13  4:00 AM      Result Value Range   WBC 8.9  4.0 - 10.5 K/uL   RBC 2.92 (*) 3.87 - 5.11 MIL/uL   Hemoglobin 9.1 (*) 12.0 - 15.0 g/dL   HCT 29.7 (*) 36.0 - 46.0 %   MCV 101.7 (*) 78.0 - 100.0 fL   MCH 31.2  26.0 - 34.0 pg   MCHC 30.6  30.0 - 36.0 g/dL   RDW 16.2 (*) 11.5 - 15.5 %   Platelets 259  150 - 400 K/uL  RENAL FUNCTION PANEL     Status: Abnormal   Collection Time    10/05/13  4:00 AM      Result Value Range   Sodium 144  137 - 147 mEq/L   Potassium 4.8  3.7 - 5.3 mEq/L   Chloride 111  96 - 112 mEq/L   CO2 19  19 - 32 mEq/L   Glucose, Bld 113 (*) 70 - 99 mg/dL   BUN 61 (*) 6 - 23 mg/dL   Creatinine, Ser 2.90 (*) 0.50 - 1.10 mg/dL   Calcium 7.6 (*) 8.4 - 10.5 mg/dL   Phosphorus 4.3  2.3 - 4.6 mg/dL   Albumin 2.2 (*) 3.5 - 5.2 g/dL   GFR calc non Af Amer 14 (*) >90 mL/min   GFR calc Af Amer 16 (*) >90 mL/min   Comment: (NOTE)     The eGFR has been calculated using the CKD EPI equation.     This calculation has not been validated in all clinical situations.     eGFR's persistently <90 mL/min signify possible Chronic Kidney     Disease.  GLUCOSE, CAPILLARY     Status: Abnormal   Collection Time    10/05/13  8:02 AM      Result Value Range   Glucose-Capillary 105 (*) 70 - 99 mg/dL  GLUCOSE, CAPILLARY     Status: Abnormal   Collection Time    10/05/13 11:58 AM      Result Value Range   Glucose-Capillary 117 (*) 70 - 99 mg/dL   No results found.  Review of Systems  Constitutional: Positive for malaise/fatigue. Negative for fever and weight loss.  Respiratory: Negative for shortness of breath.   Cardiovascular: Negative for chest pain.  Gastrointestinal: Positive for abdominal pain.  Musculoskeletal: Positive for back pain.  Neurological: Positive for weakness and headaches. Negative for dizziness.    Blood pressure 142/63, pulse 60, temperature 98.3 F (36.8 C), temperature source Oral, resp. rate 18, height $RemoveBe'5\' 1"'[id number]$  (1.549 m), weight 198 lb 10.2 oz  (90.101 kg), SpO2 98.00%. Physical Exam  Constitutional: She is oriented to person, place, and time. She appears well-developed.  obese  Cardiovascular: Normal rate and regular rhythm.   No murmur heard. Respiratory: Effort normal. She has wheezes.  GI: Soft. Bowel sounds are normal. She exhibits distension. There is no tenderness.  Musculoskeletal: Normal range of motion.  Moves all 4s; severe pain in low back  Neurological: She is alert and oriented to person, place, and time.  Skin: Skin is warm and dry.  Psychiatric: She has a normal mood and affect. Her behavior is normal. Judgment and thought content normal.     Assessment/Plan L 2 painful fracture Scheduled for KP/VP in am Pt and family aware of procedure benefits and risks and agreeable to proceed Consent signed and in chart  Kiante Petrovich A 10/05/2013, 12:19 PM

## 2013-10-06 ENCOUNTER — Other Ambulatory Visit: Payer: Self-pay | Admitting: Family Medicine

## 2013-10-06 ENCOUNTER — Inpatient Hospital Stay (HOSPITAL_COMMUNITY): Payer: Medicare Other

## 2013-10-06 DIAGNOSIS — I059 Rheumatic mitral valve disease, unspecified: Secondary | ICD-10-CM

## 2013-10-06 DIAGNOSIS — I421 Obstructive hypertrophic cardiomyopathy: Secondary | ICD-10-CM

## 2013-10-06 LAB — BASIC METABOLIC PANEL
BUN: 57 mg/dL — AB (ref 6–23)
CO2: 20 mEq/L (ref 19–32)
CREATININE: 2.79 mg/dL — AB (ref 0.50–1.10)
Calcium: 7.7 mg/dL — ABNORMAL LOW (ref 8.4–10.5)
Chloride: 110 mEq/L (ref 96–112)
GFR calc non Af Amer: 14 mL/min — ABNORMAL LOW (ref >90)
GFR, EST AFRICAN AMERICAN: 17 mL/min — AB (ref >90)
Glucose, Bld: 71 mg/dL (ref 70–99)
Potassium: 4.6 mEq/L (ref 3.7–5.3)
Sodium: 144 mEq/L (ref 137–147)

## 2013-10-06 LAB — URINE CULTURE

## 2013-10-06 LAB — GLUCOSE, CAPILLARY
GLUCOSE-CAPILLARY: 112 mg/dL — AB (ref 70–99)
GLUCOSE-CAPILLARY: 60 mg/dL — AB (ref 70–99)
Glucose-Capillary: 82 mg/dL (ref 70–99)
Glucose-Capillary: 128 mg/dL — ABNORMAL HIGH (ref 70–99)
Glucose-Capillary: 144 mg/dL — ABNORMAL HIGH (ref 70–99)

## 2013-10-06 LAB — CK: Total CK: 212 U/L — ABNORMAL HIGH (ref 7–177)

## 2013-10-06 LAB — APTT: APTT: 36 s (ref 24–37)

## 2013-10-06 LAB — PROTIME-INR
INR: 1.07 (ref 0.00–1.49)
Prothrombin Time: 13.7 seconds (ref 11.6–15.2)

## 2013-10-06 MED ORDER — HYDROMORPHONE HCL PF 1 MG/ML IJ SOLN
1.0000 mg | INTRAMUSCULAR | Status: DC | PRN
  Administered 2013-10-06 – 2013-10-07 (×5): 1 mg via INTRAVENOUS
  Filled 2013-10-06 (×5): qty 1

## 2013-10-06 MED ORDER — HYDROMORPHONE HCL PF 1 MG/ML IJ SOLN
INTRAMUSCULAR | Status: AC
  Filled 2013-10-06: qty 1

## 2013-10-06 MED ORDER — HYDROMORPHONE HCL PF 1 MG/ML IJ SOLN
1.5000 mg | Freq: Once | INTRAMUSCULAR | Status: AC
  Administered 2013-10-06: 1.5 mg via INTRAVENOUS

## 2013-10-06 MED ORDER — DEXTROSE-NACL 5-0.9 % IV SOLN
INTRAVENOUS | Status: DC
  Administered 2013-10-06 – 2013-10-07 (×2): via INTRAVENOUS
  Administered 2013-10-08: 1000 mL via INTRAVENOUS

## 2013-10-06 MED ORDER — HYDROMORPHONE HCL PF 1 MG/ML IJ SOLN
INTRAMUSCULAR | Status: AC
  Filled 2013-10-06: qty 3

## 2013-10-06 MED ORDER — INSULIN DETEMIR 100 UNIT/ML ~~LOC~~ SOLN
12.0000 [IU] | Freq: Every day | SUBCUTANEOUS | Status: DC
  Administered 2013-10-07 – 2013-10-11 (×5): 12 [IU] via SUBCUTANEOUS
  Filled 2013-10-06 (×8): qty 0.12

## 2013-10-06 MED ORDER — MIDAZOLAM HCL 2 MG/2ML IJ SOLN
INTRAMUSCULAR | Status: AC | PRN
  Administered 2013-10-06 (×5): 1 mg via INTRAVENOUS

## 2013-10-06 MED ORDER — FENTANYL CITRATE 0.05 MG/ML IJ SOLN
INTRAMUSCULAR | Status: AC | PRN
  Administered 2013-10-06 (×5): 50 ug via INTRAVENOUS

## 2013-10-06 MED ORDER — DEXTROSE 50 % IV SOLN
INTRAVENOUS | Status: AC
  Administered 2013-10-06: 25 mL
  Filled 2013-10-06: qty 50

## 2013-10-06 MED ORDER — LIP MEDEX EX OINT: TOPICAL_OINTMENT | CUTANEOUS | Status: DC | PRN

## 2013-10-06 MED ORDER — FENTANYL CITRATE 0.05 MG/ML IJ SOLN
INTRAMUSCULAR | Status: AC
  Filled 2013-10-06: qty 6

## 2013-10-06 MED ORDER — BLISTEX EX OINT
TOPICAL_OINTMENT | CUTANEOUS | Status: DC | PRN
  Filled 2013-10-06 (×2): qty 10

## 2013-10-06 MED ORDER — MIDAZOLAM HCL 2 MG/2ML IJ SOLN
INTRAMUSCULAR | Status: AC
  Filled 2013-10-06: qty 4

## 2013-10-06 NOTE — Progress Notes (Signed)
Hypoglycemic Event  CBG: 60  Treatment: D50 IV 25 mL  Symptoms: None  Follow-up CBG: Time:0808 CBG Result:112  Possible Reasons for Event: Unknown  Comments/MD notified:Dr. Wynetta Emery notified, orders received.    Princella Pellegrini  Remember to initiate Hypoglycemia Order Set & complete

## 2013-10-06 NOTE — Procedures (Signed)
L2 KP and Bone Bx No comp

## 2013-10-06 NOTE — Clinical Social Work Note (Signed)
I have reviewed and approved CSW intern's note as written.  Gwendolen Hewlett Givens, MSW, LCSW [phone]

## 2013-10-06 NOTE — ED Notes (Signed)
Family updated as to patient's status.

## 2013-10-06 NOTE — Clinical Social Work Note (Signed)
CSW and CSW intern visited room at 3:18 PM to discuss short term rehab at a skilled nursing facility as a back up option to inpatient rehab. Patient was out of room in a procedure.    Wellington Hampshire, CSW Intern.

## 2013-10-06 NOTE — Progress Notes (Signed)
I await therapy progress after Kyphoplasty to assist in determining if inpt rehab an option. Bed availability also. Discussed with SW to pursue other rehab options as needed. [phone]

## 2013-10-06 NOTE — Progress Notes (Signed)
  Echocardiogram 2D Echocardiogram has been performed.  Diamond Nickel 10/06/2013, 10:42 AM

## 2013-10-06 NOTE — Progress Notes (Addendum)
[facility] PROGRESS NOTE  [name] [name] [id number] DOB: 03-13-1926 DOA: 10/01/2013 PCP: Ann Held, MD  Interim History:  [name] [name] is an 80 y.o. female with history of DM, HTN, CKD, who sustained a fall 10/01/13 and laid on the floor for 7-10 hours. She was admitted on 10/02/13 via [facility] with acute on chronic renal failure due to rhabdomyolysis as well as severe hypoglycemia. Patient with recent L2 compression fracture and had been started on dilaudid and valium for outpatient pain management. She was treated with IV fluid with improvement. IVR consulted and plans on moving up kyphoplasty on 1/15 to help with pain management. Therapies initiated and CIR recommended by MD and therapy team.   Assessment/Plan: 1. Rhabdomyolysis - CK levels improved. Nephro was informed of pt's condition [name]). IVFs. Recheck BMP in am. PT/OT ordered to assess patient after fall. Recommending CIR. CKD stage 4 - at baseline, recheck in AM. CIR consulted and following.  2. Severe hypoglycemia when admitted - Resolved now. Pt family reports that she took her 70/30 insulin but was not able to eat anything. Start basal insulin and SSI.  Should not be started back on 70/30.   3. Hyperglycemia -started basal bolus insulin, monitoring bS closely. Much better controlled. Following.  4. Dehydration - continue gentle IVF hydration   5. UTI - started on ceftriaxone IV, change to p.o. Antibiotics when taking po.  She is NPO for kyphoplasty this morning.  6. Constipation - added miralax 7. Back Pain - Pt was scheduled to have a kyphoplasty 1/16 outpatient, asked IR to eval in hospital as pt still having significant back pain. Pt is scheduled to have kyphoplasty 1/15.   8. Hyperkalemia - treated with kayexalate and much better now.  9. Hypertrophic cardiomyopathy - Pt did have a 4 second pause (asymptomatic) last night. No recurrence seen. Will continue her beta blockers and continue to monitor, if there is a  recurrence will consult cards, She has seen Dr. Burt Knack. Also will check Echocardiogram as her murmur sounds more pronounced today. It may be just because she is sitting up in a chair, her last echo was 2 years ago. Echo pending.   Code Status: DNR  Family Communication: Spoke with daughter at length (telephone) Disposition Plan: to CIR (inpatient rehab) when medically stable   HPI/Subjective: Pt complaining of back pain.      Objective: Filed Vitals:   10/06/13 0613  BP: 151/69  Pulse: 80  Temp: 97.8 F (36.6 C)  Resp: 20    Intake/Output Summary (Last 24 hours) at 10/06/13 0740 Last data filed at 10/06/13 [id number]  Gross per 24 hour  Intake    600 ml  Output      0 ml  Net    600 ml   Filed Weights   10/03/13 2103 10/04/13 2033 10/05/13 2100  Weight: 198 lb 10.2 oz (90.1 kg) 198 lb 10.2 oz (90.101 kg) 198 lb 1.2 oz (89.846 kg)    Exam:  General: Awake, oriented, no apparent distress, speaking clearly  Cardiovascular: normal s1, s2 sounds with pronounced systolic murmur  Respiratory: BBS clear  Abdomen: soft, nondistended, nontender  Musculoskeletal: no CCE, persistent back pain with palpation  Data Reviewed: Basic Metabolic Panel:  Recent Labs Lab 10/03/13 0520 10/04/13 0550 10/04/13 1350 10/04/13 2210 10/05/13 0400 10/06/13 0356  NA 141 141 140  --  144 144  K 5.3 5.7* 6.1* 5.2 4.8 4.6  CL 108 110 109  --  111 110  CO2  19 18* 17*  --  19 20  GLUCOSE 255* 210* 219*  --  113* 71  BUN 66* 66* 65*  --  61* 57*  CREATININE 2.49* 2.78* 2.82*  --  2.90* 2.79*  CALCIUM 8.0* 7.6* 7.7*  --  7.6* 7.7*  PHOS  --   --   --   --  4.3  --    Liver Function Tests:  Recent Labs Lab 10/03/13 0520 10/05/13 0400  AST 36  --   ALT 20  --   ALKPHOS 63  --   BILITOT 0.3  --   PROT 5.9*  --   ALBUMIN 2.5* 2.2*   No results found for this basename: LIPASE, AMYLASE,  in the last 168 hours No results found for this basename: AMMONIA,  in the last 168 hours CBC:  Recent  Labs Lab 10/03/13 0520 10/05/13 0400  WBC 10.8* 8.9  HGB 10.1* 9.1*  HCT 31.8* 29.7*  MCV 98.5 101.7*  PLT 307 259   Cardiac Enzymes:  Recent Labs Lab 10/02/13 0350 10/03/13 0520 10/04/13 0550  CKTOTAL 1026* 1070* 495*   BNP (last 3 results) No results found for this basename: PROBNP,  in the last 8760 hours CBG:  Recent Labs Lab 10/04/13 2036 10/05/13 0802 10/05/13 1158 10/05/13 1619 10/05/13 2103  GLUCAP 112* 105* 117* 171* 109*    Recent Results (from the past 240 hour(s))  URINE CULTURE     Status: None   Collection Time    10/03/13 11:30 PM      Result Value Range Status   Specimen Description URINE, RANDOM   Final   Special Requests NONE   Final   Culture  Setup Time     Final   Value: 10/04/2013 01:10     Performed at [facility]     Final   Value: >=100,000 COLONIES/ML     Performed at [facility]   Culture     Final   Value: ESCHERICHIA COLI     Performed at [facility]   Report Status 10/06/2013 FINAL   Final   Organism ID, Bacteria ESCHERICHIA COLI   Final     Studies: No results found.  Scheduled Meds: . amitriptyline  10 mg Oral QHS  . aspirin EC  81 mg Oral Daily  . atorvastatin  20 mg Oral q1800  .  ceFAZolin (ANCEF) IV  2 g Intravenous Once  . cefTRIAXone (ROCEPHIN)  IV  1 g Intravenous QHS  . febuxostat  40 mg Oral QHS  . furosemide  40 mg Oral BID  . heparin  5,000 Units Subcutaneous Q8H  . insulin aspart  0-9 Units Subcutaneous TID WC  . insulin aspart  5 Units Subcutaneous TID WC  . insulin detemir  12 Units Subcutaneous Daily  . lanthanum  500 mg Oral TID WC  . metoprolol succinate  12.5 mg Oral Daily  . omega-3 acid ethyl esters  1 g Oral Daily  . polyethylene glycol  17 g Oral Daily  . senna-docusate  1 tablet Oral BID  . sodium bicarbonate  650 mg Oral BID  . vitamin B-12  1,000 mcg Oral Daily   Continuous Infusions: . dextrose 5 % and 0.9% NaCl     Principal Problem:    Rhabdomyolysis Active Problems:   CKD (chronic kidney disease) stage 4, GFR 15-29 ml/min   Hypoglycemia   UTI (urinary tract infection)   Hyperkalemia   Hypertrophic obstructive  cardiomyopathy(425.11)  [place] [employer] Pager [phone]. If 7PM-7AM, please contact night-coverage at [email or web address], password [facility] 10/06/2013, 7:40 AM  LOS: 5 days

## 2013-10-06 NOTE — ED Notes (Signed)
Patient denies pain and is resting comfortably.  

## 2013-10-07 ENCOUNTER — Inpatient Hospital Stay (HOSPITAL_COMMUNITY): Payer: Medicare Other

## 2013-10-07 LAB — TROPONIN I: TROPONIN I: 0.36 ng/mL — AB (ref <0.30)

## 2013-10-07 LAB — BLOOD GAS, ARTERIAL
Acid-base deficit: 7.3 mmol/L — ABNORMAL HIGH (ref 0.0–2.0)
Acid-base deficit: 4.7 mmol/L — ABNORMAL HIGH (ref 0.0–2.0)
BICARBONATE: 20.3 meq/L (ref 20.0–24.0)
Bicarbonate: 21.9 mEq/L (ref 20.0–24.0)
Drawn by: 23588
Drawn by: 249101
FIO2: 1 %
O2 Content: 4 L/min
O2 SAT: 97 %
O2 Saturation: 99.2 %
PATIENT TEMPERATURE: 98.6
PCO2 ART: 61.3 mmHg — AB (ref 35.0–45.0)
PH ART: 7.147 — AB (ref 7.350–7.450)
PH ART: 7.219 — AB (ref 7.350–7.450)
Patient temperature: 98.6
TCO2: 22.2 mmol/L (ref 0–100)
TCO2: 23.6 mmol/L (ref 0–100)
pCO2 arterial: 55.7 mmHg — ABNORMAL HIGH (ref 35.0–45.0)
pO2, Arterial: 132 mmHg — ABNORMAL HIGH (ref 80.0–100.0)
pO2, Arterial: 92.3 mmHg (ref 80.0–100.0)

## 2013-10-07 LAB — GLUCOSE, CAPILLARY
GLUCOSE-CAPILLARY: 237 mg/dL — AB (ref 70–99)
GLUCOSE-CAPILLARY: 340 mg/dL — AB (ref 70–99)
Glucose-Capillary: 179 mg/dL — ABNORMAL HIGH (ref 70–99)
Glucose-Capillary: 241 mg/dL — ABNORMAL HIGH (ref 70–99)
Glucose-Capillary: 291 mg/dL — ABNORMAL HIGH (ref 70–99)

## 2013-10-07 LAB — BASIC METABOLIC PANEL
BUN: 54 mg/dL — AB (ref 6–23)
CHLORIDE: 110 meq/L (ref 96–112)
CO2: 20 meq/L (ref 19–32)
Calcium: 8.1 mg/dL — ABNORMAL LOW (ref 8.4–10.5)
Creatinine, Ser: 2.64 mg/dL — ABNORMAL HIGH (ref 0.50–1.10)
GFR calc Af Amer: 18 mL/min — ABNORMAL LOW (ref >90)
GFR, EST NON AFRICAN AMERICAN: 15 mL/min — AB (ref >90)
GLUCOSE: 172 mg/dL — AB (ref 70–99)
POTASSIUM: 4.8 meq/L (ref 3.7–5.3)
Sodium: 144 mEq/L (ref 137–147)

## 2013-10-07 LAB — CBC
HEMATOCRIT: 31.4 % — AB (ref 36.0–46.0)
HEMOGLOBIN: 9.4 g/dL — AB (ref 12.0–15.0)
MCH: 31.2 pg (ref 26.0–34.0)
MCHC: 29.9 g/dL — AB (ref 30.0–36.0)
MCV: 104.3 fL — AB (ref 78.0–100.0)
Platelets: 272 10*3/uL (ref 150–400)
RBC: 3.01 MIL/uL — ABNORMAL LOW (ref 3.87–5.11)
RDW: 16.1 % — ABNORMAL HIGH (ref 11.5–15.5)
WBC: 9.9 10*3/uL (ref 4.0–10.5)

## 2013-10-07 LAB — MRSA PCR SCREENING: MRSA by PCR: POSITIVE — AB

## 2013-10-07 LAB — CK: Total CK: 168 U/L (ref 7–177)

## 2013-10-07 MED ORDER — VANCOMYCIN HCL IN DEXTROSE 1-5 GM/200ML-% IV SOLN
1000.0000 mg | INTRAVENOUS | Status: DC
  Administered 2013-10-07 – 2013-10-09 (×2): 1000 mg via INTRAVENOUS
  Filled 2013-10-07 (×2): qty 200

## 2013-10-07 MED ORDER — TRAMADOL HCL 50 MG PO TABS
50.0000 mg | ORAL_TABLET | ORAL | Status: DC
  Administered 2013-10-07 – 2013-10-14 (×32): 50 mg via ORAL
  Filled 2013-10-07 (×33): qty 1

## 2013-10-07 MED ORDER — HYDROMORPHONE HCL PF 1 MG/ML IJ SOLN
1.0000 mg | INTRAMUSCULAR | Status: DC | PRN
  Administered 2013-10-08: 1 mg via INTRAVENOUS
  Filled 2013-10-07: qty 1

## 2013-10-07 MED ORDER — PIPERACILLIN-TAZOBACTAM IN DEX 2-0.25 GM/50ML IV SOLN
2.2500 g | Freq: Four times a day (QID) | INTRAVENOUS | Status: DC
  Administered 2013-10-08 – 2013-10-10 (×10): 2.25 g via INTRAVENOUS
  Filled 2013-10-07 (×16): qty 50

## 2013-10-07 MED ORDER — INSULIN ASPART 100 UNIT/ML ~~LOC~~ SOLN
5.0000 [IU] | Freq: Once | SUBCUTANEOUS | Status: AC
  Administered 2013-10-07: 5 [IU] via SUBCUTANEOUS

## 2013-10-07 NOTE — Progress Notes (Signed)
ANTIBIOTIC CONSULT NOTE - INITIAL  Pharmacy Consult for vanc/zosyn Indication: pneumonia  No Known Allergies  Patient Measurements: Height: 5\' 1"  (154.9 cm) Weight: 198 lb 1.2 oz (89.846 kg) IBW/kg (Calculated) : 47.8 Adjusted Body Weight:   Vital Signs: Temp: 98.3 F (36.8 C) (01/16 0416) Temp src: Oral (01/16 0416) BP: 156/50 mmHg (01/16 1325) Pulse Rate: 88 (01/16 1325) Intake/Output from previous day: 01/15 0701 - 01/16 0700 In: 1500 [P.O.:400; I.V.:1000; IV Piggyback:100] Out: -  Intake/Output from this shift: Total I/O In: 240 [P.O.:240] Out: -   Labs:  Recent Labs  10/05/13 0400 10/06/13 0356 10/07/13 0410  WBC 8.9  --  9.9  HGB 9.1*  --  9.4*  PLT 259  --  272  CREATININE 2.90* 2.79* 2.64*   Estimated Creatinine Clearance: 15.9 ml/min (by C-G formula based on Cr of 2.64). No results found for this basename: VANCOTROUGH, Corlis Leak, VANCORANDOM, [place], GENTPEAK, GENTRANDOM, TOBRATROUGH, TOBRAPEAK, TOBRARND, AMIKACINPEAK, AMIKACINTROU, AMIKACIN,  in the last 72 hours   Microbiology: Recent Results (from the past 720 hour(s))  URINE CULTURE     Status: None   Collection Time    10/03/13 11:30 PM      Result Value Range Status   Specimen Description URINE, RANDOM   Final   Special Requests NONE   Final   Culture  Setup Time     Final   Value: 10/04/2013 01:10     Performed at [place]     Final   Value: >=100,000 COLONIES/ML     Performed at [facility]   Culture     Final   Value: ESCHERICHIA COLI     Performed at [facility]   Report Status 10/06/2013 FINAL   Final   Organism ID, Bacteria ESCHERICHIA COLI   Final    Medical History: Past Medical History  Diagnosis Date  . Diabetes mellitus     mellitus? x25 years  . HTN (hypertension)     x2 years  . Hypercholesterolemia     x 2 years  . Renal insufficiency     mild to moderate  . Anemia     chronic anemia due to chronic disease  . CAD  (coronary artery disease)     nonobstructive   . GERD (gastroesophageal reflux disease)     Medications:  Prescriptions prior to admission  Medication Sig Dispense Refill  . amitriptyline (ELAVIL) 10 MG tablet Take 10 mg by mouth at bedtime.       Marland Kitchen aspirin EC 81 MG tablet Take 81 mg by mouth daily.      Marland Kitchen atorvastatin (LIPITOR) 20 MG tablet Take 20 mg by mouth daily.      . Calcium-Magnesium-Vitamin D (CALCIUM 500 PO) Take 500 mg by mouth daily.      Marland Kitchen CINNAMON PO Take 2 tablets by mouth daily.      . diazepam (VALIUM) 5 MG tablet Take 5 mg by mouth every 12 (twelve) hours as needed for anxiety.      . febuxostat (ULORIC) 40 MG tablet Take 40 mg by mouth at bedtime.      . fish oil-omega-3 fatty acids 1000 MG capsule Take 1 g by mouth daily.       . furosemide (LASIX) 40 MG tablet Take 40 mg by mouth 2 (two) times daily.       Marland Kitchen GARLIC PO Take 1 tablet by mouth daily.      . Glucosamine-Chondroitin 250-200  MG CAPS Take 1 capsule by mouth daily.        Marland Kitchen HYDROmorphone (DILAUDID) 4 MG tablet Take 4 mg by mouth every 4 (four) hours as needed for severe pain.      Marland Kitchen insulin aspart protamine- aspart (NOVOLOG MIX 70/30) (70-30) 100 UNIT/ML injection Inject 19-29 Units into the skin 2 (two) times daily with a meal. Uses 29 units in the morning and 19 units at night      . lanthanum (FOSRENOL) 500 MG chewable tablet Chew 500 mg by mouth 3 (three) times daily with meals.      . metoprolol succinate (TOPROL-XL) 25 MG 24 hr tablet Take 12.5 mg by mouth daily.       . Misc Natural Products (OSTEO BI-FLEX TRIPLE STRENGTH PO) Take 1 tablet by mouth daily.      . nateglinide (STARLIX) 60 MG tablet Take 60 mg by mouth every morning. Takes prior to large meal      . sodium bicarbonate 650 MG tablet Take 650 mg by mouth 2 (two) times daily.      . traMADol (ULTRAM-ER) 100 MG 24 hr tablet Take 100-200 mg by mouth daily as needed for pain.      . vitamin B-12 (CYANOCOBALAMIN) 1000 MCG tablet Take 1,000 mcg  by mouth daily.       Scheduled:  . amitriptyline  10 mg Oral QHS  . aspirin EC  81 mg Oral Daily  . atorvastatin  20 mg Oral q1800  . febuxostat  40 mg Oral QHS  . furosemide  40 mg Oral BID  . heparin  5,000 Units Subcutaneous Q8H  . insulin aspart  0-9 Units Subcutaneous TID WC  . insulin aspart  5 Units Subcutaneous TID WC  . insulin detemir  12 Units Subcutaneous Daily  . lanthanum  500 mg Oral TID WC  . metoprolol succinate  12.5 mg Oral Daily  . omega-3 acid ethyl esters  1 g Oral Daily  . polyethylene glycol  17 g Oral Daily  . senna-docusate  1 tablet Oral BID  . sodium bicarbonate  650 mg Oral BID  . traMADol  50 mg Oral Q4H  . vitamin B-12  1,000 mcg Oral Daily   Infusions:  . dextrose 5 % and 0.9% NaCl 50 mL/hr at 10/07/13 0441   Assessment: 80 yo who was admitted after a fall on 1/10. She aspirated on 1/16. Vanc and zosyn have been ordered for PNA.   Goal of Therapy:  Vancomycin trough level 15-20 mcg/ml  Plan:   Vanc 1g IV q48 Zosyn 2.25g IV q6 F/u with level if needed  Onnie Boer, PharmD Pager: [phone] 10/07/2013 2:11 PM

## 2013-10-07 NOTE — Clinical Social Work Placement (Addendum)
Clinical Social Work Department CLINICAL SOCIAL WORK PLACEMENT NOTE 10/07/2013  Patient:  [name] [name], [name] [name]  Account Number:  [id number] Admit date:  10/01/2013  Clinical Social Worker:  Emmarie Sannes Givens, LCSW  Date/time:  10/07/2013 02:08 AM  Clinical Social Work is seeking post-discharge placement for this patient at the following level of care:   SKILLED NURSING   (*CSW will update this form in Epic as items are completed)     Patient/family provided with [place] Department of Clinical Social Work's list of facilities offering this level of care within the geographic area requested by the patient (or if unable, by the patient's family).  10/07/2013  Patient/family informed of their freedom to choose among providers that offer the needed level of care, that participate in Medicare, Medicaid or managed care program needed by the patient, have an available bed and are willing to accept the patient.    Patient/family informed of [facility]' ownership interest in [facility], as well as of the fact that they are under no obligation to receive care at this facility.  PASARR submitted to EDS on 02/01/2007 PASARR number received from EDS on 02/01/2007 - [id number] [id number]  FL2 transmitted to all facilities in geographic area requested by pt/family on  10/07/2013 FL2 transmitted to all facilities within larger geographic area on   Patient informed that his/her managed care company has contracts with or will negotiate with  certain facilities, including the following:     Patient/family informed of bed offers received:   Patient chooses bed at  Physician recommends and patient chooses bed at    Patient to be transferred to  on   Patient to be transferred to facility by   The following physician request were entered in Epic:  Additional Comments: 10/07/13 - After faxing patient to SNF's in [facility] [place] informed by MD that patient almost coded and would be transferred to a  stepdown unit. Patient was transferred to 3S06.

## 2013-10-07 NOTE — Clinical Social Work Psychosocial (Signed)
Clinical Social Work Department BRIEF PSYCHOSOCIAL ASSESSMENT 10/07/2013  Patient:  [name] [name], [name] [name]     Account Number:  [id number]     Admit date:  10/01/2013  Clinical Social Worker:  Frederico Hamman  Date/Time:  10/07/2013 01:22 AM  Referred by:  Physician  Date Referred:  10/07/2013 Referred for  SNF Placement   Other Referral:   Interview type:  Family Other interview type:   CSW talked with daughter [name] [name] [phone]).    PSYCHOSOCIAL DATA Living Status:  FAMILY Admitted from facility:   Level of care:   Primary support name:  [name] [name] Primary support relationship to patient:  CHILD, ADULT Degree of support available:   Patient also has a son [name] [name] - [phone].    CURRENT CONCERNS Current Concerns  Post-Acute Placement   Other Concerns:    SOCIAL WORK ASSESSMENT / PLAN CSW informed by attending MD that patient ready for discharge and during his conversation with daughter, her preference is [facility] in [place].    CSW spoke with daughter by phone regarding discharge plans and informed her that MD indicated that patient ready for discharge today. CSW recounted conversation had with MD with daughter and requested a 2nd choice if [facility] does not have a bed. Daughter very adamantly stated that MD told her that patient would be ready for d/c on Monday. CSW attempted to continue conversation with daughter and explain my conversation with attending, however daughter ended the conversation by hanging up.    CSW followed-up with MD and was advised that patient almost coded and will be sent to a step-down unit.   Assessment/plan status:  Psychosocial Support/Ongoing Assessment of Needs Other assessment/ plan:   Information/referral to community resources:    PATIENT'S/FAMILY'S RESPONSE TO PLAN OF CARE: Daughter agreeable to SNF because no one home with patient during the day, but very adamant that she was informed by MD that d/c date would be  Monday.

## 2013-10-07 NOTE — Progress Notes (Signed)
Spoke to son and updated fully. He is HCPOA as well.  He understood fully.  He was appreciative of this

## 2013-10-07 NOTE — Progress Notes (Addendum)
Pt's daughter [name] was not satisfied with the frequency and amount of pain medication that her mother was receiving. She was verbally disrespectful to the nurse caring for her mother and she demanded to speak with the MD caring for her mother. I was present at the time Dr. Beryle Lathe called the pt's daughter on speaker phone and explained the severity of giving her mother pain medication to frequently and at a higher dose. Pt's daughter seem to be ok with Dr. Arlyss Queen response to her concern about the pain medication. Pt's daughter was called back to be notified of her mother's change in condition and that she would be transferred to a higher level of care. She again became very disrespectful to Dr. Verlon Au and Mitchell Heir, RN floor nurse on our unit and hung up the phone. The Administrator of Nursing was called and made aware of the situation at hand and stated that he would follow up the any further encounters with the patients daughter.

## 2013-10-07 NOTE — Progress Notes (Addendum)
[facility] PROGRESS NOTE  [name] FWY:[id number] DOB: 1926-05-26 DOA: 10/01/2013 PCP: [name] Held, MD  Interim History:    [name] [name] is an 80 y.o. female with history of DM, HTN, CKD [stage 3-4], who sustained a fall 10/01/13 and laid on the floor for 7-10 hours. She was admitted on 10/02/13 via [facility] with acute on chronic renal failure due to rhabdomyolysis as well as severe hypoglycemia.  Patient with recent L2 compression fracture and had been started on dilaudid and valium for outpatient pain management. She was treated with IV fluid with improvement. IR consulted performed Kyphoplasty 1/15 Unfortunately patient Aspirated on 10/07/13 and although she is a DNR, was resuscitatd within the scope of her care and transferred to the SDU for IV antibiotics. Her daughter [name] [name] has on numerous occasions during this Hospital stay expressed her dissatisfaction at our care for her mother and has been verbally disrespectful to nursing staff and MD's  Assessment/Plan:  1. Toxic Metabolic encephalopathy Likely Aspiration Pneumonitis-Stat CXR, ABG, Troponin, CT head.  SHe is DNR and will be transferred to the SDU-she is not to be intubated-Cover with Vanc and Zosyn.  Will speak to Daughter once again as well as Critical care to determine best way forward. 2. Possible Sepsis 2/2 to Aspiration-Cover c empiric vancomycin Zosyn 3. Rhabdomyolysis - CK levels currently 168. Nephro was informed of pt's condition.  Saline 150/hour 4.  Recheck BMP in am. PT/OT ordered to assess patient after fall. Recommending SNF 5. Severe hypoglycemia when admitted - Resolved now. Pt family reports that she took her 70/30 insulin but was not able to eat anything. Start basal insulin and SSI.  Should not be started back on 70/30 on d/c home 6. Hyperglycemia -started basal bolus insulin, monitoring BS closely. Much better controlled. CBG 82-241 7. Dehydration -hold IV fluids for now 8. Treated Pyelo this  admission 9. Constipation - added miralax 10. Back Pain - Pt was scheduled to have a kyphoplasty 1/16 outpatient, Had kyphoplasty 1/15.  Explained clearly to Daughter [name] [name] 1/16 that althogh we want her pain controlled, we do not want to cause respiratory depression 11. Hyperkalemia - treated with kayexalate and much better now.  12. Hypertrophic cardiomyopathy - Pt did have a 4 second pause (asymptomatic) last night. No recurrence seen. Will continue her beta blockers and continue to monitor.  Echo this admit =LVEF   Code Status: DNR  Family Communication: Spoke with daughter at length and explained the process.  Patient's daughter has made accusatory statements to this examiner such as "while this patient was under your watch, she aspirated" she also misunderstood when I said earlier the when the patient was stable, that "you told me the patient will be discharged on Monday however the social worker said that she was stable for discharge today".  She was also disrespectful to staff on numerous occasions and unhappy with the care received here Disposition Plan: Gaurded prognosis if DNR/DNI.  WIll discuss options with repsiratory  HPI/Subjective: Pt complaining of back pain.   Better than prior-Sleepy   Subsequently, Patient had a change status at bedside and became altered and had drooling from the right side of her mouth. Respiratory arrest cause undertaken patient's placed on a non-rebreather and his sats came back up  [place] 7.14/61.3/132   Objective: Filed Vitals:   10/07/13 1325  BP: 156/50  Pulse: 88  Temp:   Resp:     Intake/Output Summary (Last 24 hours) at 10/07/13 1235 Last data filed at 10/07/13  0900  Gross per 24 hour  Intake   1540 ml  Output      0 ml  Net   1540 ml   Filed Weights   10/04/13 2033 10/05/13 2100 10/06/13 2131  Weight: 90.101 kg (198 lb 10.2 oz) 89.846 kg (198 lb 1.2 oz) 89.846 kg (198 lb 1.2 oz)    Exam:  General: Awake, oriented, no apparent  distress, was altered Cardiovascular: normal s1, s2 sounds with pronounced systolic murmur  Respiratory: BBS clear  Abdomen: soft, nondistended, nontender  Musculoskeletal: no CCE, persistent back pain with palpation  Data Reviewed: Basic Metabolic Panel:  Recent Labs Lab 10/04/13 0550 10/04/13 1350 10/04/13 2210 10/05/13 0400 10/06/13 0356 10/07/13 0410  NA 141 140  --  144 144 144  K 5.7* 6.1* 5.2 4.8 4.6 4.8  CL 110 109  --  111 110 110  CO2 18* 17*  --  19 20 20   GLUCOSE 210* 219*  --  113* 71 172*  BUN 66* 65*  --  61* 57* 54*  CREATININE 2.78* 2.82*  --  2.90* 2.79* 2.64*  CALCIUM 7.6* 7.7*  --  7.6* 7.7* 8.1*  PHOS  --   --   --  4.3  --   --    Liver Function Tests:  Recent Labs Lab 10/03/13 0520 10/05/13 0400  AST 36  --   ALT 20  --   ALKPHOS 63  --   BILITOT 0.3  --   PROT 5.9*  --   ALBUMIN 2.5* 2.2*   No results found for this basename: LIPASE, AMYLASE,  in the last 168 hours No results found for this basename: AMMONIA,  in the last 168 hours CBC:  Recent Labs Lab 10/03/13 0520 10/05/13 0400 10/07/13 0410  WBC 10.8* 8.9 9.9  HGB 10.1* 9.1* 9.4*  HCT 31.8* 29.7* 31.4*  MCV 98.5 101.7* 104.3*  PLT 307 259 272   Cardiac Enzymes:  Recent Labs Lab 10/02/13 0350 10/03/13 0520 10/04/13 0550 10/06/13 1401 10/07/13 0410  CKTOTAL 1026* 1070* 495* 212* 168   BNP (last 3 results) No results found for this basename: PROBNP,  in the last 8760 hours CBG:  Recent Labs Lab 10/06/13 1039 10/06/13 1748 10/06/13 2115 10/07/13 0852 10/07/13 1156  GLUCAP 82 128* 144* 179* 241*    Recent Results (from the past 240 hour(s))  URINE CULTURE     Status: None   Collection Time    10/03/13 11:30 PM      Result Value Range Status   Specimen Description URINE, RANDOM   Final   Special Requests NONE   Final   Culture  Setup Time     Final   Value: 10/04/2013 01:10     Performed at [facility]     Final   Value: >=100,000  COLONIES/ML     Performed at [facility]   Culture     Final   Value: ESCHERICHIA COLI     Performed at [facility]   Report Status 10/06/2013 FINAL   Final   Organism ID, Bacteria ESCHERICHIA COLI   Final     Studies: Ir Kypho Vertebral Lumbar Augmentation  10/07/2013   CLINICAL DATA:  L2 compression fracture  EXAM: L2 KYPHOPLASTY  MEDICATIONS AND MEDICAL HISTORY: Versed 4 mg, Fentanyl 250 mcg.  As antibiotic prophylaxis, Ancef was ordered pre-procedure and administered intravenously within one hour of incision.  ANESTHESIA/SEDATION: Moderate sedation time: 40 minutes  CONTRAST:  None  FLUOROSCOPY TIME:  5 min and 42 seconds.  PROCEDURE: The procedure, risks, benefits, and alternatives were explained to the patient. Questions regarding the procedure were encouraged and answered. The patient understands and consents to the procedure.  The back was prepped with Betadine in a sterile fashion, and a sterile drape was applied covering the operative field. A sterile gown and sterile gloves were used for the procedure.  Under fluoroscopic guidance, a 10 gauge needle was inserted into the L2 vertebral body via right transpedicular approach. A 14 gauge core biopsy was obtained. A drill was utilized to create a tract to the anterior cortex. A 1.5 cm balloon was advanced into the vertebral body and insufflated at the midline. The balloon was removed and contrast was utilized to fill the cavity with interdigitation. The needle was removed.  FINDINGS: Images demonstrate needle and balloon placement in the L2 vertebral body via a right transpedicular approach. The final image demonstrates cement filling the vertebral body, crossing the midline, and extending from the inferior to superior end plate. No extra osseous cement.  IMPRESSION: Successful L2 KYPHOPLASTY.   Electronically Signed   By: Maryclare Bean M.D.   On: 10/07/2013 08:54    Scheduled Meds: . amitriptyline  10 mg Oral QHS  . aspirin EC   81 mg Oral Daily  . atorvastatin  20 mg Oral q1800  . febuxostat  40 mg Oral QHS  . furosemide  40 mg Oral BID  . heparin  5,000 Units Subcutaneous Q8H  . insulin aspart  0-9 Units Subcutaneous TID WC  . insulin aspart  5 Units Subcutaneous TID WC  . insulin detemir  12 Units Subcutaneous Daily  . lanthanum  500 mg Oral TID WC  . metoprolol succinate  12.5 mg Oral Daily  . omega-3 acid ethyl esters  1 g Oral Daily  . polyethylene glycol  17 g Oral Daily  . senna-docusate  1 tablet Oral BID  . sodium bicarbonate  650 mg Oral BID  . vitamin B-12  1,000 mcg Oral Daily   Continuous Infusions: . dextrose 5 % and 0.9% NaCl 50 mL/hr at 10/07/13 0441   Principal Problem:   Rhabdomyolysis Active Problems:   CKD (chronic kidney disease) stage 4, GFR 15-29 ml/min   Hypoglycemia   UTI (urinary tract infection)   Hyperkalemia   Hypertrophic obstructive cardiomyopathy(425.11)  Nita Sells  [facility] Pager [phone].  If 7PM-7AM, please contact night-coverage at [email or web address], password [facility] 10/07/2013, 12:35 PM  LOS: 6 days

## 2013-10-07 NOTE — Progress Notes (Signed)
Utilization review completed.  

## 2013-10-07 NOTE — Significant Event (Signed)
Rapid Response Event Note  Overview: Time Called: 0938 Arrival Time: 1308 Event Type: Neurologic;Respiratory  Initial Focused Assessment: Patient with shallow RR, minimally responsive BP 156/50  SR 88   RR16  O2 sat 100% on NRB Lung sounds decreased bases.  Purplish lips MD at bedside   Interventions: Patient able to cough, cont encourage Pulm toilet. ABG done on 100% NRB Weaned to 6L [place], patient now pink. MD spoke with family will move to SDU and plan Bipap 1345 patient became much more responsive, starting to stay awake without stimulation 1400 transferred patient to 3S06 via bed with O2 via [place] and heart monitor.  Event Summary: Name of Physician Notified: Dr Verlon Au at bedside at      at    Outcome: Transferred (Comment) [phone])  Event End Time: [place]  Raliegh Ip

## 2013-10-07 NOTE — Progress Notes (Signed)
Late Entry  This RN entered pt's room about 20 mins after pt rec'd IV pain medicine (IV dilaudid at 1243) and pt was noted to be unresponsive. Pt had a blank stare and was not responsive to voice/touch/pain. Food/saliva was noted to be coming out of the right side of pt's mouth. Charge RN, MD, rapid response, and respiratory were all notified. CBG 291, O2 sats low 50's. Pt placed on NRB and O2 sats increased to high 90's. After being suctioned and becoming more alert/responsive, pt was transferred to SDU and placed on a bipap. Dr. Verlon Au discussed pt's situation with daughter [name].   Aaron Edelman, Jovahn Breit [place]

## 2013-10-07 NOTE — Progress Notes (Signed)
  Subjective: L2 KP was performed in IR with Dr Barbie Banner 1/15 Pt is in much less pain Slept well  Objective: Vital signs in last 24 hours: Temp:  [97.9 F (36.6 C)-99 F (37.2 C)] 98.3 F (36.8 C) (01/16 0416) Pulse Rate:  [77-83] 77 (01/16 0416) Resp:  [14-27] 16 (01/16 0416) BP: (127-159)/(39-57) 159/56 mmHg (01/16 0416) SpO2:  [94 %-99 %] 94 % (01/16 0416) Weight:  [198 lb 1.2 oz (89.846 kg)] 198 lb 1.2 oz (89.846 kg) (01/15 2131) Last BM Date: 10/05/13  Intake/Output from previous day: 01/15 0701 - 01/16 0700 In: 1500 [P.O.:400; I.V.:1000; IV Piggyback:100] Out: -  Intake/Output this shift:    PE:  Afeb; vss Site of L2 KP NT; no bleeding; no hematoma Moves all 4s; neuro intact resting   Lab Results:   Recent Labs  10/05/13 0400 10/07/13 0410  WBC 8.9 9.9  HGB 9.1* 9.4*  HCT 29.7* 31.4*  PLT 259 272   BMET  Recent Labs  10/06/13 0356 10/07/13 0410  NA 144 144  K 4.6 4.8  CL 110 110  CO2 20 20  GLUCOSE 71 172*  BUN 57* 54*  CREATININE 2.79* 2.64*  CALCIUM 7.7* 8.1*   PT/INR  Recent Labs  10/06/13 0356  LABPROT 13.7  INR 1.07   ABG No results found for this basename: PHART, PCO2, PO2, HCO3,  in the last 72 hours  Studies/Results: No results found.  Anti-infectives: Anti-infectives   Start     Dose/Rate Route Frequency Ordered Stop   10/06/13 0000  ceFAZolin (ANCEF) IVPB 2 g/50 mL premix    Comments:  Hang ON CALL to xray 1/15   2 g 100 mL/hr over 30 Minutes Intravenous  Once 10/05/13 1233 10/06/13 1624   10/04/13 0230  cefTRIAXone (ROCEPHIN) 1 g in dextrose 5 % 50 mL IVPB     1 g 100 mL/hr over 30 Minutes Intravenous Daily at bedtime 10/04/13 0151        Assessment/Plan: s/p * No surgery found *  L2 kyphoplasty 1/15 Doing well   LOS: 6 days    Eily Louvier A 10/07/2013

## 2013-10-07 NOTE — Progress Notes (Signed)
PT Cancellation Note  Patient Details Name: ISSIS LINDSETH MRN: [id number] DOB: 04/06/1926   Cancelled Treatment:    Reason Eval/Treat Not Completed: Medical issues which prohibited therapy; patient transferred to SDU due to question aspiration event.  Will check back tomorrow if medically appropriate.   Averianna Brugger,CYNDI 10/07/2013, 2:58 PM

## 2013-10-08 ENCOUNTER — Inpatient Hospital Stay (HOSPITAL_COMMUNITY): Payer: Medicare Other

## 2013-10-08 DIAGNOSIS — J9600 Acute respiratory failure, unspecified whether with hypoxia or hypercapnia: Secondary | ICD-10-CM

## 2013-10-08 DIAGNOSIS — E8779 Other fluid overload: Secondary | ICD-10-CM

## 2013-10-08 DIAGNOSIS — E8770 Fluid overload, unspecified: Secondary | ICD-10-CM | POA: Insufficient documentation

## 2013-10-08 LAB — BLOOD GAS, ARTERIAL
ACID-BASE DEFICIT: 3.6 mmol/L — AB (ref 0.0–2.0)
BICARBONATE: 22.8 meq/L (ref 20.0–24.0)
DRAWN BY: 24513
O2 CONTENT: 4 L/min
O2 Saturation: 99.3 %
PO2 ART: 139 mmHg — AB (ref 80.0–100.0)
Patient temperature: 98.6
TCO2: 24.6 mmol/L (ref 0–100)
pCO2 arterial: 56 mmHg — ABNORMAL HIGH (ref 35.0–45.0)
pH, Arterial: 7.234 — ABNORMAL LOW (ref 7.350–7.450)

## 2013-10-08 LAB — GLUCOSE, CAPILLARY
GLUCOSE-CAPILLARY: 208 mg/dL — AB (ref 70–99)
Glucose-Capillary: 193 mg/dL — ABNORMAL HIGH (ref 70–99)
Glucose-Capillary: 227 mg/dL — ABNORMAL HIGH (ref 70–99)
Glucose-Capillary: 119 mg/dL — ABNORMAL HIGH (ref 70–99)

## 2013-10-08 LAB — CBC WITH DIFFERENTIAL/PLATELET
BASOS PCT: 0 % (ref 0–1)
Basophils Absolute: 0 10*3/uL (ref 0.0–0.1)
EOS ABS: 0.2 10*3/uL (ref 0.0–0.7)
EOS PCT: 3 % (ref 0–5)
HCT: 29.6 % — ABNORMAL LOW (ref 36.0–46.0)
Hemoglobin: 8.9 g/dL — ABNORMAL LOW (ref 12.0–15.0)
LYMPHS ABS: 1.3 10*3/uL (ref 0.7–4.0)
Lymphocytes Relative: 15 % (ref 12–46)
MCH: 31 pg (ref 26.0–34.0)
MCHC: 30.1 g/dL (ref 30.0–36.0)
MCV: 103.1 fL — ABNORMAL HIGH (ref 78.0–100.0)
MONOS PCT: 9 % (ref 3–12)
Monocytes Absolute: 0.8 10*3/uL (ref 0.1–1.0)
NEUTROS PCT: 73 % (ref 43–77)
Neutro Abs: 6 10*3/uL (ref 1.7–7.7)
Platelets: 260 10*3/uL (ref 150–400)
RBC: 2.87 MIL/uL — AB (ref 3.87–5.11)
RDW: 15.9 % — ABNORMAL HIGH (ref 11.5–15.5)
WBC: 8.3 10*3/uL (ref 4.0–10.5)

## 2013-10-08 LAB — RENAL FUNCTION PANEL
Albumin: 2.4 g/dL — ABNORMAL LOW (ref 3.5–5.2)
BUN: 53 mg/dL — AB (ref 6–23)
CO2: 22 mEq/L (ref 19–32)
Calcium: 7.9 mg/dL — ABNORMAL LOW (ref 8.4–10.5)
Chloride: 109 mEq/L (ref 96–112)
Creatinine, Ser: 2.74 mg/dL — ABNORMAL HIGH (ref 0.50–1.10)
GFR calc Af Amer: 17 mL/min — ABNORMAL LOW (ref >90)
GFR calc non Af Amer: 15 mL/min — ABNORMAL LOW (ref >90)
Glucose, Bld: 207 mg/dL — ABNORMAL HIGH (ref 70–99)
PHOSPHORUS: 3.9 mg/dL (ref 2.3–4.6)
POTASSIUM: 4.3 meq/L (ref 3.7–5.3)
Sodium: 144 mEq/L (ref 137–147)

## 2013-10-08 LAB — URINALYSIS, ROUTINE W REFLEX MICROSCOPIC
BILIRUBIN URINE: NEGATIVE
GLUCOSE, UA: 250 mg/dL — AB
Ketones, ur: NEGATIVE mg/dL
Nitrite: NEGATIVE
PROTEIN: 30 mg/dL — AB
Specific Gravity, Urine: 1.016 (ref 1.005–1.030)
Urobilinogen, UA: 0.2 mg/dL (ref 0.0–1.0)
pH: 5 (ref 5.0–8.0)

## 2013-10-08 LAB — URINE MICROSCOPIC-ADD ON

## 2013-10-08 MED ORDER — FUROSEMIDE 10 MG/ML IJ SOLN
40.0000 mg | Freq: Once | INTRAMUSCULAR | Status: AC
  Administered 2013-10-08: 40 mg via INTRAVENOUS
  Filled 2013-10-08: qty 4

## 2013-10-08 MED ORDER — FUROSEMIDE 10 MG/ML IJ SOLN
80.0000 mg | Freq: Two times a day (BID) | INTRAMUSCULAR | Status: DC
  Administered 2013-10-08 – 2013-10-09 (×3): 80 mg via INTRAVENOUS
  Filled 2013-10-08 (×6): qty 8

## 2013-10-08 MED ORDER — PHENOL 1.4 % MT LIQD: 1.0000 | Freq: Three times a day (TID) | OROMUCOSAL | Status: DC | PRN

## 2013-10-08 NOTE — Progress Notes (Signed)
[facility] PROGRESS NOTE  SILENA CORTEZ ESP:[id number] DOB: [birth year] DOA: 10/01/2013  PCP: Lucila Maine, MD  Brief HPI: [name] [name] is an 80 y.o. female with history of DM, HTN, CKD [stage 3-4], who sustained a fall 10/01/13 and laid on the floor for 7-10 hours. She was admitted on 10/02/13 via RH with acute on chronic renal failure due to rhabdomyolysis as well as severe hypoglycemia.  Patient with recent L2 compression fracture and had been started on dilaudid and valium for outpatient pain management. She was treated with IV fluid with improvement. IR consulted performed Kyphoplasty 1/15. Unfortunately patient Aspirated on 10/07/13 and although she is a DNR, was resuscitatd within the scope of her care and transferred to the SDU for IV antibiotics. Her daughter Shaune Pascal has on numerous occasions during this Hospital stay expressed her dissatisfaction at our care for her mother and has been verbally disrespectful to nursing staff and MD's. Son appears to be reasonable.  Past medical history:  Past Medical History  Diagnosis Date  . Diabetes mellitus     mellitus? x25 years  . HTN (hypertension)     x2 years  . Hypercholesterolemia     x 2 years  . Renal insufficiency     mild to moderate  . Anemia     chronic anemia due to chronic disease  . CAD (coronary artery disease)     nonobstructive   . GERD (gastroesophageal reflux disease)     Consultants: IR  Procedures: Kyphoplasty 1/15  Antibiotics: Vanc 1/16--> Zosyn 1/16-->  Subjective: Patient feels better today. Still short of breath. Had coughing spells with eating and drinking. Denies chest pain. Back pain is better as well.  Objective: Vital Signs  Filed Vitals:   10/08/13 0400 10/08/13 0417 10/08/13 0809 10/08/13 0810  BP:  141/38  143/50  Pulse:  71  74  Temp: 98 F (36.7 C)  97.8 F (36.6 C)   TempSrc: Oral  Oral   Resp:  19  19  Height:      Weight:      SpO2:  100%  100%    Intake/Output  Summary (Last 24 hours) at 10/08/13 1045 Last data filed at 10/08/13 1000  Gross per 24 hour  Intake   1460 ml  Output    375 ml  Net   1085 ml   Filed Weights   10/04/13 2033 10/05/13 2100 10/06/13 2131  Weight: 90.101 kg (198 lb 10.2 oz) 89.846 kg (198 lb 1.2 oz) 89.846 kg (198 lb 1.2 oz)    General appearance: alert, cooperative, appears stated age and no distress Head: Normocephalic, without obvious abnormality, atraumatic Resp: Crackles bilaterally without wheezing. Cardio: regular rate and rhythm, S1, S2 normal.  Systolic murmur over precordium. no click, rub or gallop GI: soft, non-tender; bowel sounds normal; no masses,  no organomegaly Extremities: edema noted.  Pulses: 2+ and symmetric Skin: Skin color, texture, turgor normal. No rashes or lesions Neurologic: Alert and oriented x 3. No focal deficits.  Lab Results:  Basic Metabolic Panel:  Recent Labs Lab 10/04/13 1350 10/04/13 2210 10/05/13 0400 10/06/13 0356 10/07/13 0410 10/08/13 0540  NA 140  --  144 144 144 144  K 6.1* 5.2 4.8 4.6 4.8 4.3  CL 109  --  111 110 110 109  CO2 17*  --  19 20 20 22   GLUCOSE 219*  --  113* 71 172* 207*  BUN 65*  --  61* 57* 54* 53*  CREATININE 2.82*  --  2.90* 2.79* 2.64* 2.74*  CALCIUM 7.7*  --  7.6* 7.7* 8.1* 7.9*  PHOS  --   --  4.3  --   --  3.9   Liver Function Tests:  Recent Labs Lab 10/03/13 0520 10/05/13 0400 10/08/13 0540  AST 36  --   --   ALT 20  --   --   ALKPHOS 63  --   --   BILITOT 0.3  --   --   PROT 5.9*  --   --   ALBUMIN 2.5* 2.2* 2.4*   CBC:  Recent Labs Lab 10/03/13 0520 10/05/13 0400 10/07/13 0410 10/08/13 0540  WBC 10.8* 8.9 9.9 8.3  NEUTROABS  --   --   --  6.0  HGB 10.1* 9.1* 9.4* 8.9*  HCT 31.8* 29.7* 31.4* 29.6*  MCV 98.5 101.7* 104.3* 103.1*  PLT 307 259 272 260   Cardiac Enzymes:  Recent Labs Lab 10/02/13 0350 10/03/13 0520 10/04/13 0550 10/06/13 1401 10/07/13 0410 10/07/13 1600  CKTOTAL 1026* 1070* 495* 212* 168   --   TROPONINI  --   --   --   --   --  0.36*   BNP (last 3 results) No results found for this basename: PROBNP,  in the last 8760 hours CBG:  Recent Labs Lab 10/07/13 1156 10/07/13 1306 10/07/13 1659 10/07/13 2154 10/08/13 0804  GLUCAP 241* 291* 237* 340* 193*    Recent Results (from the past 240 hour(s))  URINE CULTURE     Status: None   Collection Time    10/03/13 11:30 PM      Result Value Range Status   Specimen Description URINE, RANDOM   Final   Special Requests NONE   Final   Culture  Setup Time     Final   Value: 10/04/2013 01:10     Performed at [facility]     Final   Value: >=100,000 COLONIES/ML     Performed at [facility]   Culture     Final   Value: ESCHERICHIA COLI     Performed at [facility]   Report Status 10/06/2013 FINAL   Final   Organism ID, Bacteria ESCHERICHIA COLI   Final  MRSA PCR SCREENING     Status: Abnormal   Collection Time    10/07/13  3:08 PM      Result Value Range Status   MRSA by PCR POSITIVE (*) NEGATIVE Final   Comment:            The GeneXpert MRSA Assay (FDA     approved for NASAL specimens     only), is one component of a     comprehensive MRSA colonization     surveillance program. It is not     intended to diagnose MRSA     infection nor to guide or     monitor treatment for     MRSA infections.     RESULT CALLED TO, READ BACK BY AND VERIFIED WITH:     E. STANFIELD RN 17:00 10/07/13 ([email or web address])      Studies/Results: Ct Head Wo Contrast  10/07/2013   CLINICAL DATA:  Evaluate for intracranial abnormalities  EXAM: CT HEAD WITHOUT CONTRAST  TECHNIQUE: Contiguous axial images were obtained from the base of the skull through the vertex without intravenous contrast.  COMPARISON:  None.  FINDINGS: There is no evidence of acute hemorrhage nor extra-axial fluid collections. There is diffuse cortical atrophy as  well as diffuse areas of low attenuation within the subcortical, deep, and  periventricular white matter regions. Ventricle is cisterns are patent. There is no evidence of subfalcine or tonsillar herniation. The cerebellum, pons, and basal ganglia regions are unremarkable.  There is no evidence of a depressed skull fracture. The visualized paranasal sinuses and mastoid air cells are patent.  IMPRESSION: Chronic and involutional changes without evidence of focal or acute abnormalities.   Electronically Signed   By: Margaree Mackintosh M.D.   On: 10/07/2013 16:58   Dg Chest Port 1 View  10/08/2013   CLINICAL DATA:  Aspiration pneumonia  EXAM: PORTABLE CHEST - 1 VIEW  COMPARISON:  October 07, 2013  FINDINGS: Mild cardiac enlargement. Mild vascular congestion. Right lung is clear. Mild hazy opacity left base suggests small effusion, stable. No focal consolidation.  IMPRESSION: Vascular congestion with stable small left effusion.   Electronically Signed   By: Skipper Cliche M.D.   On: 10/08/2013 07:18   Dg Chest Port 1 View  10/07/2013   CLINICAL DATA:  Possible aspiration.  EXAM: PORTABLE CHEST - 1 VIEW  COMPARISON:  Chest 10/01/2013.  FINDINGS: The patient is rotated on this study. There is interstitial pulmonary edema. Left basilar atelectasis is noted. There may be a small left effusion. No focal process on the right is seen. There is no pneumothorax.  IMPRESSION: Negative for aspiration.  Interstitial edema and small left effusion.   Electronically Signed   By: Inge Rise M.D.   On: 10/07/2013 14:17   Ir Kypho Vertebral Lumbar Augmentation  10/07/2013   CLINICAL DATA:  L2 compression fracture  EXAM: L2 KYPHOPLASTY  MEDICATIONS AND MEDICAL HISTORY: Versed 4 mg, Fentanyl 250 mcg.  As antibiotic prophylaxis, Ancef was ordered pre-procedure and administered intravenously within one hour of incision.  ANESTHESIA/SEDATION: Moderate sedation time: 40 minutes  CONTRAST:  None  FLUOROSCOPY TIME:  5 min and 42 seconds.  PROCEDURE: The procedure, risks, benefits, and alternatives were  explained to the patient. Questions regarding the procedure were encouraged and answered. The patient understands and consents to the procedure.  The back was prepped with Betadine in a sterile fashion, and a sterile drape was applied covering the operative field. A sterile gown and sterile gloves were used for the procedure.  Under fluoroscopic guidance, a 10 gauge needle was inserted into the L2 vertebral body via right transpedicular approach. A 14 gauge core biopsy was obtained. A drill was utilized to create a tract to the anterior cortex. A 1.5 cm balloon was advanced into the vertebral body and insufflated at the midline. The balloon was removed and contrast was utilized to fill the cavity with interdigitation. The needle was removed.  FINDINGS: Images demonstrate needle and balloon placement in the L2 vertebral body via a right transpedicular approach. The final image demonstrates cement filling the vertebral body, crossing the midline, and extending from the inferior to superior end plate. No extra osseous cement.  IMPRESSION: Successful L2 KYPHOPLASTY.   Electronically Signed   By: Maryclare Bean M.D.   On: 10/07/2013 08:54    Medications:  Scheduled: . amitriptyline  10 mg Oral QHS  . aspirin EC  81 mg Oral Daily  . atorvastatin  20 mg Oral q1800  . febuxostat  40 mg Oral QHS  . furosemide  40 mg Intravenous Once  . furosemide  80 mg Intravenous Q12H  . heparin  5,000 Units Subcutaneous Q8H  . insulin aspart  0-9 Units Subcutaneous TID WC  .  insulin aspart  5 Units Subcutaneous TID WC  . insulin detemir  12 Units Subcutaneous Daily  . lanthanum  500 mg Oral TID WC  . metoprolol succinate  12.5 mg Oral Daily  . omega-3 acid ethyl esters  1 g Oral Daily  . piperacillin-tazobactam (ZOSYN)  IV  2.25 g Intravenous Q6H  . polyethylene glycol  17 g Oral Daily  . senna-docusate  1 tablet Oral BID  . sodium bicarbonate  650 mg Oral BID  . traMADol  50 mg Oral Q4H  . vancomycin  1,000 mg  Intravenous Q48H  . vitamin B-12  1,000 mcg Oral Daily   Continuous:  EHU:DJSHFWYO, HYDROmorphone (DILAUDID) injection, lip balm, ondansetron, phenol  Assessment/Plan:  Principal Problem:   Rhabdomyolysis Active Problems:   CKD (chronic kidney disease) stage 4, GFR 15-29 ml/min   Hypoglycemia   UTI (urinary tract infection)   Hyperkalemia   Hypertrophic obstructive cardiomyopathy(425.11)    Acute Respiratory failure Likely Aspiration Pneumonitis along with fluid overload. She has been positive by about 9ltrs this stay. CXR this morning shows vascular congestion. Will give IV Lasix for now. Strict I/O. May need to place foley. SLP to see as well. Continue abx for now. Stop IVF. Patient refuses bipap. Seems stable currently.  Rhabdomyolysis CK levels improved. Resolved. Was secondary to fall. Stop IVF.  Diabetes Mellitus Type 2 with episodes of Hypoglycemia Monitor CBG's closely. On Levemir and SSI. Check A1C. Was on 70/30 at home which should be stopped and changed to Levemir.   Pyelonephritis with E coli Treated with abx.   Constipation Added miralax  Back Pain Status post kyphoplasty to L2 1/15. Seems to be better. Continue PT/OT.   CKD Stage 4 Continue to monitor urine output. Monitor creatinine. Noted to be in fluid overload. Lasix as stated earlier.  History of Hypertrophic cardiomyopathy Stable for most part. ECHO showed normal EF with diastolic dysfunction. She did have a pause earlier this hospitalization. No recurrence seen. Will continue her beta blockers and continue to monitor.   Code Status: DNR  DVT Prophylaxis: Heparin    Family Communication: Discussed with her son  Disposition Plan: Leave in SDU for now. Plan is for SNF versus CIR next week.    LOS: 7 days   [facility] Pager [phone] 10/08/2013, 10:45 AM  If 8PM-8AM, please contact night-coverage at [email or web address], password [facility]

## 2013-10-08 NOTE — Evaluation (Signed)
Clinical/Bedside Swallow Evaluation Patient Details  Name: [name] [name] MRN: [id number] Date of Birth: June 05, 1926  Today's Date: 10/08/2013 Time: 1630-1700 SLP Time Calculation (min): 30 min  Past Medical History:  Past Medical History  Diagnosis Date  . Diabetes mellitus     mellitus? x25 years  . HTN (hypertension)     x2 years  . Hypercholesterolemia     x 2 years  . Renal insufficiency     mild to moderate  . Anemia     chronic anemia due to chronic disease  . CAD (coronary artery disease)     nonobstructive   . GERD (gastroesophageal reflux disease)    Past Surgical History:  Past Surgical History  Procedure Laterality Date  . Total knee arthroplasty      L-2006; R-2008   . Appendectomy    . Back surgery      lower back  . Transthoracic echocardiogram  05/2011  . Av fistula placement  07/07/2012    Procedure: ARTERIOVENOUS (AV) FISTULA CREATION;  Surgeon: Mal Misty, MD;  Location: [facility] OR;  Service: Vascular;  Laterality: Right;  Brachial - cephalic arterivenous fistula   HPI:  [name] [name] is an 80 y.o. female with history of DM, HTN, CKD stage 3-4, who sustained a fall 10/01/13 and laid on the floor for 7-10 hours. She was admitted on 10/02/13 via RH with acute on chronic renal failure due to rhabdomyolysis as well as severe hypoglycemia.  BSE ordered due to Nursing reporting difficulty swallowing pills.    Assessment / Plan / Recommendation Clinical Impression  BSE completed.  Oral and pharyngeal phase of the swallow appears functional with adequate initiation and hyoid laryngeal elevation.  PO trials limited secondary to patient presenting with nausea.  No outward clinical s/s of aspiration noted with trials of water by cup and straw and puree consistency.  Immediate gagging and coughing s/p swallow of mechanical soft solid with reports of bolus feeling "stuck".  Excessive burping s/p trials.    S/s more suspected esophageal based but will recommend to  downgrade to dysphagia 1 (puree) as tolerated mainly for comfort.   Continue thin liquids.  Recommend supervision with PO's initially.  ST to f/u on 10/09/13 for possible diet advancement. Diagnostic treatment completed following evaluation focusing on providing education to RN and patient on recommendations.  May benefit from GI consult to assess esophageal function if symptoms have not improved.      Aspiration Risk  Moderate    Diet Recommendation Dysphagia 1 (Puree);Thin liquid   Liquid Administration via: Cup;Straw Medication Administration: Crushed with puree Supervision: Full supervision/cueing for compensatory strategies Compensations: Slow rate;Small sips/bites;Follow solids with liquid;Multiple dry swallows after each bite/sip Postural Changes and/or Swallow Maneuvers: Seated upright 90 degrees;Upright 30-60 min after meal    Other  Recommendations Oral Care Recommendations: Oral care Q4 per protocol   Follow Up Recommendations  Inpatient Rehab    Frequency and Duration min 2x/week  2 weeks       SLP Swallow Goals Please refer to Care Plans   Swallow Study Prior Functional Status   Lives in apartment in daughter's house     General Date of Onset: 10/01/13 HPI: [name] [name] is an 80 y.o. female with history of DM, HTN, CKD stage 3-4, who sustained a fall 10/01/13 and laid on the floor for 7-10 hours. She was admitted on 10/02/13 via RH with acute on chronic renal failure due to rhabdomyolysis as well as severe hypoglycemia.  Type of Study: Bedside swallow evaluation Diet Prior to this Study: Thin liquids;Regular Respiratory Status: Nasal cannula History of Recent Intubation: No Behavior/Cognition: Alert;Cooperative;Pleasant mood;Distractible;Requires cueing Oral Cavity - Dentition: Adequate natural dentition Self-Feeding Abilities: Needs assist Patient Positioning:  (sitting upright EOB) Baseline Vocal Quality: Hoarse;Low vocal intensity Volitional Cough:  Wet;Congested Volitional Swallow: Able to elicit    Oral/Motor/Sensory Function Overall Oral Motor/Sensory Function: Appears within functional limits for tasks assessed   Ice Chips Ice chips: Within functional limits   Thin Liquid Thin Liquid: Within functional limits Presentation: Cup;Straw    Nectar Thick Nectar Thick Liquid: Not tested   Honey Thick Honey Thick Liquid: Not tested   Puree Puree: Within functional limits   Solid   GO    Solid: Impaired Pharyngeal Phase Impairments: Change in Vital Signs;Cough - Immediate Other Comments: Immediate gagging and coughing s/p swallow reporting globas sensation       Sharman Crate Christine, CCC-SLP [phone] [facility] 10/08/2013,6:51 PM

## 2013-10-08 NOTE — Progress Notes (Signed)
Physical Therapy Treatment Patient Details Name: [name] [name] MRN: [id number] DOB: [birth year] Today's Date: 10/08/2013 Time: 0160-1093 PT Time Calculation (min): 27 min  PT Assessment / Plan / Recommendation  History of Present Illness patient admitted s/p fall.  Patient with recent fall with L2 compression fx and h/o CKD.  Now with rhabdo.  w/p kyphoplasty 1/15   PT Comments   Pt making slow improvements.  Mobility and balance remain problems for her and she needs support for safety.  But post kyphoplasty she does have less pain with mobility and should be able to start progressing more steadily now.   Follow Up Recommendations  CIR     Does the patient have the potential to tolerate intense rehabilitation     Barriers to Discharge        Equipment Recommendations  Rolling walker with 5" wheels;3in1 (PT)    Recommendations for Other Services Rehab consult  Frequency Min 3X/week   Progress towards PT Goals Progress towards PT goals: Progressing toward goals  Plan Current plan remains appropriate    Precautions / Restrictions Precautions Precautions: Fall;Back   Pertinent Vitals/Pain 4-5 /10 back pain    Mobility  Bed Mobility Overal bed mobility: Needs Assistance Bed Mobility: Rolling;Sidelying to Sit;Sit to Sidelying Rolling: Mod assist Sidelying to sit: Mod assist;HOB elevated Sit to sidelying: Mod assist;+2 for physical assistance General bed mobility comments: cues for sequencing, truncal assist Transfers Overall transfer level: Needs assistance Equipment used: Rolling walker (2 wheeled) Transfers: Sit to/from Stand Sit to Stand: Max assist General transfer comment: cues for safety, assist to help pt attain midline.  tending to list R moderately heavy    Exercises     PT Diagnosis:    PT Problem List:   PT Treatment Interventions:     PT Goals (current goals can now be found in the care plan section) Acute Rehab PT Goals PT Goal Formulation: With  patient Time For Goal Achievement: 10/16/13 Potential to Achieve Goals: Good  Visit Information  Last PT Received On: 10/08/13 Assistance Needed: +2 History of Present Illness: patient admitted s/p fall.  Patient with recent fall with L2 compression fx and h/o CKD.  Now with rhabdo.  w/p kyphoplasty 1/15    Subjective Data      Cognition  Cognition Arousal/Alertness: Awake/alert Behavior During Therapy: WFL for tasks assessed/performed Overall Cognitive Status: Within Functional Limits for tasks assessed Area of Impairment:  (slowed processing)    Balance  Balance Overall balance assessment: Needs assistance Sitting-balance support: Single extremity supported;Bilateral upper extremity supported;Feet supported Sitting balance-Leahy Scale: Poor Sitting balance - Comments: tended to list R moderately and needed v/tc's to return and hold midline Standing balance support: Bilateral upper extremity supported;During functional activity Standing balance-Leahy Scale: Poor Standing balance comment: again tended to list R and needed supportive cues to w/s back toward L LE.  End of Session PT - End of Session Activity Tolerance: Patient tolerated treatment well Patient left: in bed;with call bell/phone within reach Nurse Communication: Mobility status   GP     [name] [name], [name] [name] 10/08/2013, 6:01 PM 10/08/2013  [name] [name], [name] [phone]  (pager)

## 2013-10-08 NOTE — Progress Notes (Signed)
Pt has refused BiPAP.  RN and MD aware.  Pt currently stable , sats 97% on 4 lpm [place].  RT to monitor as needed.

## 2013-10-09 DIAGNOSIS — J690 Pneumonitis due to inhalation of food and vomit: Secondary | ICD-10-CM

## 2013-10-09 LAB — GLUCOSE, CAPILLARY
GLUCOSE-CAPILLARY: 107 mg/dL — AB (ref 70–99)
Glucose-Capillary: 157 mg/dL — ABNORMAL HIGH (ref 70–99)
Glucose-Capillary: 200 mg/dL — ABNORMAL HIGH (ref 70–99)
Glucose-Capillary: 313 mg/dL — ABNORMAL HIGH (ref 70–99)

## 2013-10-09 LAB — CBC
HCT: 27.9 % — ABNORMAL LOW (ref 36.0–46.0)
Hemoglobin: 8.5 g/dL — ABNORMAL LOW (ref 12.0–15.0)
MCH: 31.3 pg (ref 26.0–34.0)
MCHC: 30.5 g/dL (ref 30.0–36.0)
MCV: 102.6 fL — AB (ref 78.0–100.0)
PLATELETS: 251 10*3/uL (ref 150–400)
RBC: 2.72 MIL/uL — AB (ref 3.87–5.11)
RDW: 16.1 % — AB (ref 11.5–15.5)
WBC: 8.3 10*3/uL (ref 4.0–10.5)

## 2013-10-09 LAB — BASIC METABOLIC PANEL
BUN: 50 mg/dL — ABNORMAL HIGH (ref 6–23)
CO2: 23 mEq/L (ref 19–32)
CREATININE: 2.87 mg/dL — AB (ref 0.50–1.10)
Calcium: 8.1 mg/dL — ABNORMAL LOW (ref 8.4–10.5)
Chloride: 107 mEq/L (ref 96–112)
GFR calc non Af Amer: 14 mL/min — ABNORMAL LOW (ref >90)
GFR, EST AFRICAN AMERICAN: 16 mL/min — AB (ref >90)
Glucose, Bld: 82 mg/dL (ref 70–99)
Potassium: 4 mEq/L (ref 3.7–5.3)
Sodium: 145 mEq/L (ref 137–147)

## 2013-10-09 LAB — PRO B NATRIURETIC PEPTIDE: PRO B NATRI PEPTIDE: 22117 pg/mL — AB (ref 0–450)

## 2013-10-09 LAB — TROPONIN I: Troponin I: 0.31 ng/mL (ref <0.30)

## 2013-10-09 MED ORDER — INSULIN ASPART 100 UNIT/ML ~~LOC~~ SOLN
7.0000 [IU] | Freq: Once | SUBCUTANEOUS | Status: AC
  Administered 2013-10-10: 7 [IU] via SUBCUTANEOUS

## 2013-10-09 NOTE — Progress Notes (Signed)
Speech Language Pathology Treatment: Dysphagia  Patient Details Name: TRENDA CORLISS MRN: [id number] DOB: Apr 30, 1926 Today's Date: 10/09/2013 Time: 1630-1700 SLP Time Calculation (min): 30 min  Assessment / Plan / Recommendation Clinical Impression  F/u from initial BSE for diet tolerance of puree consistency and thin liquids and possible advancement.  Patient reports puree easier to "go down but sometimes puree feels stuck".    Administered soft solids with continued reports of globus sensation and discomfort.  Oral and pharyngeal phase appears functional with adequate initiation and laryngeal elevations.  S/s continues with suspected esophageal based dysphagia.  Recommend to continue current diet consistency with full supervision.  ST to f/u on 10/10/13 for possible diet advancement.  Hopeful to upgrade next date as overall endurance improves.  Patient to benefit from GI consult to further assess esophageal functioning.     HPI HPI: ALYXIS GRIPPI is an 80 y.o. female with history of DM, HTN, CKD stage 3-4, who sustained a fall 10/01/13 and laid on the floor for 7-10 hours. She was admitted on 10/02/13 via RH with acute on chronic renal failure due to rhabdomyolysis as well as severe hypoglycemia.        SLP Plan  Continue with current plan of care    Recommendations Diet recommendations: Dysphagia 1 (puree);Thin liquid Medication Administration: Crushed with puree Supervision: Patient able to self feed;Staff to assist with self feeding;Full supervision/cueing for compensatory strategies Compensations: Slow rate;Small sips/bites;Multiple dry swallows after each bite/sip;Follow solids with liquid Postural Changes and/or Swallow Maneuvers: Seated upright 90 degrees;Upright 30-60 min after meal              General recommendations: Rehab consult Oral Care Recommendations: Oral care Q4 per protocol Follow up Recommendations: Inpatient Rehab Plan: Continue with current plan of care     Hunker Jolivue, [place] [facility] 10/09/2013, 5:16 PM

## 2013-10-09 NOTE — Progress Notes (Signed)
[facility] PROGRESS NOTE  [name] [name] VOJ:[id number] DOB: [birth year] DOA: 10/01/2013  PCP: Ann Held, MD  Brief HPI: [name] [name] is an 80 y.o. female with history of DM, HTN, CKD [stage 3-4], who sustained a fall 10/01/13 and laid on the floor for 7-10 hours. She was admitted on 10/02/13 via [facility] with acute on chronic renal failure due to rhabdomyolysis as well as severe hypoglycemia.  Patient with recent L2 compression fracture and had been started on dilaudid and valium for outpatient pain management. She was treated with IV fluid with improvement. IR consulted performed Kyphoplasty 1/15. Unfortunately patient Aspirated on 10/07/13 and although she is a DNR, was resuscitatd within the scope of her care and transferred to the SDU for IV antibiotics. Her daughter [name] has on numerous occasions during this Hospital stay expressed her dissatisfaction at our care for her mother and has been verbally disrespectful to nursing staff and MD's. Son appears to be reasonable.  Past medical history:  Past Medical History  Diagnosis Date  . Diabetes mellitus     mellitus? x25 years  . HTN (hypertension)     x2 years  . Hypercholesterolemia     x 2 years  . Renal insufficiency     mild to moderate  . Anemia     chronic anemia due to chronic disease  . CAD (coronary artery disease)     nonobstructive   . GERD (gastroesophageal reflux disease)     Consultants: IR  Procedures:  Kyphoplasty 1/15  2D ECHO 1/15 Study Conclusions - Left ventricle: The cavity size was normal. There was moderate concentric hypertrophy. Systolic function was vigorous. The estimated ejection fraction was in the range of 65% to 70%. There was dynamic obstruction. There was dynamic obstruction, with a peak velocity of 357cm/sec and a peak gradient of 29mm Hg. Wall motion was normal; there were no regional wall motion abnormalities. Doppler parameters are consistent with abnormal left ventricular  relaxation (grade 1 diastolic dysfunction). Doppler parameters are consistent with high ventricular filling pressure. - Mitral valve: Moderately calcified annulus. The findings are consistent with mild stenosis. Mean gradient: 104mm Hg (D). - Left atrium: The atrium was mildly dilated. - Pulmonary arteries: Systolic pressure was mildly to moderately increased. PA peak pressure: 26mm Hg (S).  Antibiotics: Vanc 1/16--> Zosyn 1/16-->  Subjective: Patient feels better. Breathing is improved. Denies chest pain. Back pain is better as well.  Objective: Vital Signs  Filed Vitals:   10/08/13 1959 10/08/13 2329 10/09/13 0327 10/09/13 0754  BP: 140/48 138/46 127/58   Pulse: 70 66 63   Temp: 97.9 F (36.6 C) 98 F (36.7 C) 97.3 F (36.3 C) 97.8 F (36.6 C)  TempSrc: Oral Oral Axillary Oral  Resp: 21 17 16    Height:      Weight:      SpO2: 100% 100% 100%     Intake/Output Summary (Last 24 hours) at 10/09/13 0941 Last data filed at 10/09/13 0759  Gross per 24 hour  Intake    440 ml  Output   1875 ml  Net  -1435 ml   Filed Weights   10/04/13 2033 10/05/13 2100 10/06/13 2131  Weight: 90.101 kg (198 lb 10.2 oz) 89.846 kg (198 lb 1.2 oz) 89.846 kg (198 lb 1.2 oz)    General appearance: alert, cooperative, appears stated age and no distress Resp: Crackles bilaterally without wheezing. Cardio: regular rate and rhythm, S1, S2 normal.  Systolic murmur over precordium. no click, rub or gallop  GI: soft, non-tender; bowel sounds normal; no masses,  no organomegaly Extremities: edema noted.  Pulses: 2+ and symmetric Neurologic: Alert and oriented x 3. No focal deficits.  Lab Results:  Basic Metabolic Panel:  Recent Labs Lab 10/04/13 1350  10/05/13 0400 10/06/13 0356 10/07/13 0410 10/08/13 0540 10/09/13 0250  NA 140  --  144 144 144 144 145  K 6.1*  < > 4.8 4.6 4.8 4.3 4.0  CL 109  --  111 110 110 109 107  CO2 17*  --  19 20 20 22 23   GLUCOSE 219*  --  113* 71 172* 207* 82   BUN 65*  --  61* 57* 54* 53* 50*  CREATININE 2.82*  --  2.90* 2.79* 2.64* 2.74* 2.87*  CALCIUM 7.7*  --  7.6* 7.7* 8.1* 7.9* 8.1*  PHOS  --   --  4.3  --   --  3.9  --   < > = values in this interval not displayed. Liver Function Tests:  Recent Labs Lab 10/03/13 0520 10/05/13 0400 10/08/13 0540  AST 36  --   --   ALT 20  --   --   ALKPHOS 63  --   --   BILITOT 0.3  --   --   PROT 5.9*  --   --   ALBUMIN 2.5* 2.2* 2.4*   CBC:  Recent Labs Lab 10/03/13 0520 10/05/13 0400 10/07/13 0410 10/08/13 0540 10/09/13 0250  WBC 10.8* 8.9 9.9 8.3 8.3  NEUTROABS  --   --   --  6.0  --   HGB 10.1* 9.1* 9.4* 8.9* 8.5*  HCT 31.8* 29.7* 31.4* 29.6* 27.9*  MCV 98.5 101.7* 104.3* 103.1* 102.6*  PLT 307 259 272 260 251   Cardiac Enzymes:  Recent Labs Lab 10/03/13 0520 10/04/13 0550 10/06/13 1401 10/07/13 0410 10/07/13 1600  CKTOTAL 1070* 495* 212* 168  --   TROPONINI  --   --   --   --  0.36*   BNP (last 3 results) No results found for this basename: PROBNP,  in the last 8760 hours CBG:  Recent Labs Lab 10/08/13 0804 10/08/13 1212 10/08/13 1725 10/08/13 2204 10/09/13 0803  GLUCAP 193* 208* 227* 119* 107*    Recent Results (from the past 240 hour(s))  URINE CULTURE     Status: None   Collection Time    10/03/13 11:30 PM      Result Value Range Status   Specimen Description URINE, RANDOM   Final   Special Requests NONE   Final   Culture  Setup Time     Final   Value: 10/04/2013 01:10     Performed at [facility] [place] [facility]     Final   Value: >=100,000 COLONIES/ML     Performed at [facility]   Culture     Final   Value: ESCHERICHIA COLI     Performed at [facility]   Report Status 10/06/2013 FINAL   Final   Organism ID, Bacteria ESCHERICHIA COLI   Final  MRSA PCR SCREENING     Status: Abnormal   Collection Time    10/07/13  3:08 PM      Result Value Range Status   MRSA by PCR POSITIVE (*) NEGATIVE Final   Comment:             The GeneXpert MRSA Assay (FDA     approved for NASAL specimens     only), is  one component of a     comprehensive MRSA colonization     surveillance program. It is not     intended to diagnose MRSA     infection nor to guide or     monitor treatment for     MRSA infections.     RESULT CALLED TO, READ BACK BY AND VERIFIED WITH:     E. STANFIELD RN 17:00 10/07/13 ([email or web address])      Studies/Results: Ct Head Wo Contrast  10/07/2013   CLINICAL DATA:  Evaluate for intracranial abnormalities  EXAM: CT HEAD WITHOUT CONTRAST  TECHNIQUE: Contiguous axial images were obtained from the base of the skull through the vertex without intravenous contrast.  COMPARISON:  None.  FINDINGS: There is no evidence of acute hemorrhage nor extra-axial fluid collections. There is diffuse cortical atrophy as well as diffuse areas of low attenuation within the subcortical, deep, and periventricular white matter regions. Ventricle is cisterns are patent. There is no evidence of subfalcine or tonsillar herniation. The cerebellum, pons, and basal ganglia regions are unremarkable.  There is no evidence of a depressed skull fracture. The visualized paranasal sinuses and mastoid air cells are patent.  IMPRESSION: Chronic and involutional changes without evidence of focal or acute abnormalities.   Electronically Signed   By: Margaree Mackintosh M.D.   On: 10/07/2013 16:58   Dg Chest Port 1 View  10/08/2013   CLINICAL DATA:  Aspiration pneumonia  EXAM: PORTABLE CHEST - 1 VIEW  COMPARISON:  October 07, 2013  FINDINGS: Mild cardiac enlargement. Mild vascular congestion. Right lung is clear. Mild hazy opacity left base suggests small effusion, stable. No focal consolidation.  IMPRESSION: Vascular congestion with stable small left effusion.   Electronically Signed   By: Skipper Cliche M.D.   On: 10/08/2013 07:18   Dg Chest Port 1 View  10/07/2013   CLINICAL DATA:  Possible aspiration.  EXAM: PORTABLE CHEST - 1 VIEW  COMPARISON:  Chest  10/01/2013.  FINDINGS: The patient is rotated on this study. There is interstitial pulmonary edema. Left basilar atelectasis is noted. There may be a small left effusion. No focal process on the right is seen. There is no pneumothorax.  IMPRESSION: Negative for aspiration.  Interstitial edema and small left effusion.   Electronically Signed   By: Inge Rise M.D.   On: 10/07/2013 14:17    Medications:  Scheduled: . amitriptyline  10 mg Oral QHS  . aspirin EC  81 mg Oral Daily  . atorvastatin  20 mg Oral q1800  . febuxostat  40 mg Oral QHS  . furosemide  80 mg Intravenous BID  . heparin  5,000 Units Subcutaneous Q8H  . insulin aspart  0-9 Units Subcutaneous TID WC  . insulin aspart  5 Units Subcutaneous TID WC  . insulin detemir  12 Units Subcutaneous Daily  . lanthanum  500 mg Oral TID WC  . metoprolol succinate  12.5 mg Oral Daily  . omega-3 acid ethyl esters  1 g Oral Daily  . piperacillin-tazobactam (ZOSYN)  IV  2.25 g Intravenous Q6H  . polyethylene glycol  17 g Oral Daily  . senna-docusate  1 tablet Oral BID  . sodium bicarbonate  650 mg Oral BID  . traMADol  50 mg Oral Q4H  . vancomycin  1,000 mg Intravenous Q48H  . vitamin B-12  1,000 mcg Oral Daily   Continuous:  DVV:OHYWVPXT, HYDROmorphone (DILAUDID) injection, lip balm, ondansetron, phenol  Assessment/Plan:  Principal Problem:   Acute respiratory failure Active  Problems:   CKD (chronic kidney disease) stage 4, GFR 15-29 ml/min   Rhabdomyolysis   Hypoglycemia   UTI (urinary tract infection)   Hyperkalemia   Hypertrophic obstructive cardiomyopathy(425.11)   Fluid overload    Acute Respiratory failure Likely Aspiration Pneumonitis along with fluid overload. She has been positive by about 9ltrs this stay. Seems tom be better today. Did not require Bipap overnight.   Pulmonary Edema Continue IV Lasix for now. Was in negative balance yesterday. Monitor renal function closely. Check Pro bnp and repeat  troponin.  Aspiration Pneumonitis SLP has seen and on Dysphagia diet. Aspiration precautions. Continue broad spectrum abx for now. Will change to Augmentin in AM.  Rhabdomyolysis CK levels improved. Resolved. Was secondary to fall. Stop IVF.  Diabetes Mellitus Type 2 with episodes of Hypoglycemia Monitor CBG's closely. On Levemir and SSI. Check A1C. Was on 70/30 at home which should be stopped and changed to Levemir.   Pyelonephritis with E coli Treated with abx. Repeat UA was abnormal. Continue abx.  Constipation Added miralax  Back Pain Status post kyphoplasty to L2 1/15. Seems to be better. Continue PT/OT.   CKD Stage 4 Continue to monitor urine output. Monitor creatinine. Noted to be in fluid overload. Lasix as stated earlier.  History of Hypertrophic cardiomyopathy Stable for most part. ECHO showed normal EF with diastolic dysfunction. She did have a pause earlier this hospitalization. No recurrence seen. Will continue her beta blockers and continue to monitor.   Code Status: DNR  DVT Prophylaxis: Heparin    Family Communication: Discussed with her son 1/17  Disposition Plan: Ok for transfer to floor. Plan is for SNF versus CIR next week.    LOS: 8 days   [facility] Pager [phone] 10/09/2013, 9:41 AM  If 8PM-8AM, please contact night-coverage at [email or web address], password [facility]

## 2013-10-10 ENCOUNTER — Inpatient Hospital Stay (HOSPITAL_COMMUNITY): Payer: Medicare Other

## 2013-10-10 DIAGNOSIS — R1310 Dysphagia, unspecified: Secondary | ICD-10-CM | POA: Diagnosis present

## 2013-10-10 DIAGNOSIS — E440 Moderate protein-calorie malnutrition: Secondary | ICD-10-CM | POA: Diagnosis present

## 2013-10-10 LAB — BASIC METABOLIC PANEL
BUN: 49 mg/dL — AB (ref 6–23)
CHLORIDE: 106 meq/L (ref 96–112)
CO2: 25 meq/L (ref 19–32)
CREATININE: 3.01 mg/dL — AB (ref 0.50–1.10)
Calcium: 8.3 mg/dL — ABNORMAL LOW (ref 8.4–10.5)
GFR calc Af Amer: 15 mL/min — ABNORMAL LOW (ref >90)
GFR calc non Af Amer: 13 mL/min — ABNORMAL LOW (ref >90)
Glucose, Bld: 133 mg/dL — ABNORMAL HIGH (ref 70–99)
Potassium: 4.2 mEq/L (ref 3.7–5.3)
Sodium: 144 mEq/L (ref 137–147)

## 2013-10-10 LAB — CBC
HEMATOCRIT: 30.8 % — AB (ref 36.0–46.0)
Hemoglobin: 9.4 g/dL — ABNORMAL LOW (ref 12.0–15.0)
MCH: 31.2 pg (ref 26.0–34.0)
MCHC: 30.5 g/dL (ref 30.0–36.0)
MCV: 102.3 fL — AB (ref 78.0–100.0)
Platelets: 247 10*3/uL (ref 150–400)
RBC: 3.01 MIL/uL — AB (ref 3.87–5.11)
RDW: 15.9 % — AB (ref 11.5–15.5)
WBC: 8.8 10*3/uL (ref 4.0–10.5)

## 2013-10-10 LAB — GLUCOSE, CAPILLARY
GLUCOSE-CAPILLARY: 116 mg/dL — AB (ref 70–99)
Glucose-Capillary: 148 mg/dL — ABNORMAL HIGH (ref 70–99)
Glucose-Capillary: 147 mg/dL — ABNORMAL HIGH (ref 70–99)
Glucose-Capillary: 136 mg/dL — ABNORMAL HIGH (ref 70–99)

## 2013-10-10 MED ORDER — AMOXICILLIN-POT CLAVULANATE 400-57 MG/5ML PO SUSR
800.0000 mg | Freq: Two times a day (BID) | ORAL | Status: DC
  Administered 2013-10-10 – 2013-10-11 (×2): 800 mg via ORAL
  Filled 2013-10-10 (×4): qty 10

## 2013-10-10 MED ORDER — PANTOPRAZOLE SODIUM 40 MG PO TBEC
40.0000 mg | DELAYED_RELEASE_TABLET | Freq: Every day | ORAL | Status: DC
  Administered 2013-10-11 – 2013-10-14 (×4): 40 mg via ORAL
  Filled 2013-10-10 (×5): qty 1

## 2013-10-10 MED ORDER — DILTIAZEM HCL 30 MG PO TABS
30.0000 mg | ORAL_TABLET | Freq: Three times a day (TID) | ORAL | Status: DC
  Administered 2013-10-10 – 2013-10-13 (×6): 30 mg via ORAL
  Filled 2013-10-10 (×12): qty 1

## 2013-10-10 MED ORDER — SUCRALFATE 1 GM/10ML PO SUSP
1.0000 g | Freq: Three times a day (TID) | ORAL | Status: DC
  Administered 2013-10-10 – 2013-10-11 (×3): 1 g via ORAL
  Filled 2013-10-10 (×6): qty 10

## 2013-10-10 MED ORDER — FUROSEMIDE 40 MG PO TABS
60.0000 mg | ORAL_TABLET | Freq: Two times a day (BID) | ORAL | Status: DC
  Administered 2013-10-11: 60 mg via ORAL
  Filled 2013-10-10 (×3): qty 1

## 2013-10-10 NOTE — Progress Notes (Signed)
I await further progress with therapy today, but pt likely to need SNF. Left message for RN CM. [phone]

## 2013-10-10 NOTE — Progress Notes (Signed)
INITIAL NUTRITION ASSESSMENT  DOCUMENTATION CODES Per approved criteria  -Non-severe (moderate) malnutrition in the context of acute illness or injury -Obesity Unspecified  Pt meets criteria for Moderate MALNUTRITION in the context of Acute illness as evidenced by estimated energy intake <75% of estimated energy needs for > 7 days, +2 edema, and evidence of mild fat wasting in arms.   INTERVENTION: Provide Glucerna Shakes BID Encourage PO intake  NUTRITION DIAGNOSIS: Inadequate oral intake related to swallowing difficulty and decreased appetite as evidenced by meal completion <50% of most meals.   Goal: Pt to meet >/= 90% of their estimated nutrition needs   Monitor:  PO intake Swallow test results Weight trends Labs  Reason for Assessment: Malnutrition Screening Tool, score of2  80 y.o. female  Admitting Dx: Acute respiratory failure  ASSESSMENT: 80 y.o. female with history of DM, HTN, CKD [stage 3-4], who sustained a fall 10/01/13 and laid on the floor for 7-10 hours. She was admitted on 10/02/13 via [facility] with acute on chronic renal failure due to rhabdomyolysis as well as severe hypoglycemia.   Pt reports eating well PTA with 3 meals daily and maintaining her weight at 190 lbs. Pt reports that she has had a decreased appetite since admission and has had difficulty swallowing foods; pt states she had a swallow test today. Per nursing notes pt is primarily eating 10-25% of meals, 50-85% of some. No evidence of weight loss.  Nutrition Focused Physical Exam:  Subcutaneous Fat:  Orbital Region: wnl Upper Arm Region: mild wasting Thoracic and Lumbar Region: wnl  Muscle:  Temple Region: wnl Clavicle Bone Region: wnl Clavicle and Acromion Bone Region: wnl Scapular Bone Region: NA Dorsal Hand: mild wasting Patellar Region: wnl Anterior Thigh Region: wnl Posterior Calf Region: wnl  Edema: +2 RLE and LLE edema  Height: Ht Readings from Last 1 Encounters:  10/09/13 5\' 6"   (1.676 m)    Weight: Wt Readings from Last 1 Encounters:  10/10/13 204 lb 5.9 oz (92.7 kg)    Ideal Body Weight: 130 lbs  % Ideal Body Weight: 157%  Wt Readings from Last 10 Encounters:  10/10/13 204 lb 5.9 oz (92.7 kg)  08/17/12 184 lb (83.462 kg)  07/06/12 184 lb (83.462 kg)  05/27/11 200 lb (90.719 kg)  03/20/09 199 lb (90.266 kg)    Usual Body Weight: 190 lbs  % Usual Body Weight: 107%  BMI:  Body mass index is 33 kg/(m^2). (Obesity)  Estimated Nutritional Needs: Kcal: 1600-1800 Protein: 80-90 grams Fluid: 1.6-1.8 L/day  Skin: +2 RLE and LLE edema; puncture on back  Diet Order: Dysphagia  EDUCATION NEEDS: -No education needs identified at this time   Intake/Output Summary (Last 24 hours) at 10/10/13 1619 Last data filed at 10/10/13 1557  Gross per 24 hour  Intake    805 ml  Output   2275 ml  Net  -1470 ml    Last BM: 1/18  Labs:   Recent Labs Lab 10/04/13 1350  10/05/13 0400  10/08/13 0540 10/09/13 0250 10/10/13 0350  NA 140  --  144  < > 144 145 144  K 6.1*  < > 4.8  < > 4.3 4.0 4.2  CL 109  --  111  < > 109 107 106  CO2 17*  --  19  < > 22 23 25   BUN 65*  --  61*  < > 53* 50* 49*  CREATININE 2.82*  --  2.90*  < > 2.74* 2.87* 3.01*  CALCIUM 7.7*  --  7.6*  < > 7.9* 8.1* 8.3*  PHOS  --   --  4.3  --  3.9  --   --   GLUCOSE 219*  --  113*  < > 207* 82 133*  < > = values in this interval not displayed.  CBG (last 3)   Recent Labs  10/09/13 2107 10/10/13 0608 10/10/13 1110  GLUCAP 313* 148* 147*    Scheduled Meds: . amitriptyline  10 mg Oral QHS  . amoxicillin-clavulanate  800 mg Oral Q12H  . aspirin EC  81 mg Oral Daily  . atorvastatin  20 mg Oral q1800  . diltiazem  30 mg Oral Q8H  . febuxostat  40 mg Oral QHS  . [START ON 10/11/2013] furosemide  60 mg Oral BID  . heparin  5,000 Units Subcutaneous Q8H  . insulin aspart  0-9 Units Subcutaneous TID WC  . insulin aspart  5 Units Subcutaneous TID WC  . insulin detemir  12 Units  Subcutaneous Daily  . lanthanum  500 mg Oral TID WC  . metoprolol succinate  12.5 mg Oral Daily  . [START ON 10/11/2013] pantoprazole  40 mg Oral Q1200  . polyethylene glycol  17 g Oral Daily  . senna-docusate  1 tablet Oral BID  . sodium bicarbonate  650 mg Oral BID  . traMADol  50 mg Oral Q4H  . vitamin B-12  1,000 mcg Oral Daily    Continuous Infusions:   Past Medical History  Diagnosis Date  . Diabetes mellitus     mellitus? x25 years  . HTN (hypertension)     x2 years  . Hypercholesterolemia     x 2 years  . Renal insufficiency     mild to moderate  . Anemia     chronic anemia due to chronic disease  . CAD (coronary artery disease)     nonobstructive   . GERD (gastroesophageal reflux disease)     Past Surgical History  Procedure Laterality Date  . Total knee arthroplasty      L-2006; R-2008   . Appendectomy    . Back surgery      lower back  . Transthoracic echocardiogram  05/2011  . Av fistula placement  07/07/2012    Procedure: ARTERIOVENOUS (AV) FISTULA CREATION;  Surgeon: Mal Misty, MD;  Location: [facility];  Service: Vascular;  Laterality: Right;  Brachial - cephalic arterivenous fistula    Pryor Ochoa RD, LDN Inpatient Clinical Dietitian Pager: [phone] After Hours Pager: [phone]

## 2013-10-10 NOTE — Progress Notes (Signed)
[facility] PROGRESS NOTE  [name] [name] WVP:[id number] DOB: September 24, 1925 DOA: 10/01/2013  PCP: Ann Held, MD  Brief HPI: [name] [name] is an 80 y.o. female with history of DM, HTN, CKD [stage 3-4], who sustained a fall 10/01/13 and laid on the floor for 7-10 hours. She was admitted on 10/02/13 via [facility] with acute on chronic renal failure due to rhabdomyolysis as well as severe hypoglycemia.  Patient with recent L2 compression fracture and had been started on dilaudid and valium for outpatient pain management. She was treated with IV fluid with improvement. IR consulted performed Kyphoplasty 1/15. Unfortunately patient Aspirated on 10/07/13 and although she is a DNR, was resuscitatd within the scope of her care and transferred to the SDU for IV antibiotics. Her daughter [name] [name] has on numerous occasions during this Hospital stay expressed her dissatisfaction at our care for her mother and has been verbally disrespectful to nursing staff and MD's. Son appears to be reasonable.  Past medical history:  Past Medical History  Diagnosis Date  . Diabetes mellitus     mellitus? x25 years  . HTN (hypertension)     x2 years  . Hypercholesterolemia     x 2 years  . Renal insufficiency     mild to moderate  . Anemia     chronic anemia due to chronic disease  . CAD (coronary artery disease)     nonobstructive   . GERD (gastroesophageal reflux disease)     Consultants: IR  Procedures:  Kyphoplasty 1/15  2D ECHO 1/15 Study Conclusions - Left ventricle: The cavity size was normal. There was moderate concentric hypertrophy. Systolic function was vigorous. The estimated ejection fraction was in the range of 65% to 70%. There was dynamic obstruction. There was dynamic obstruction, with a peak velocity of 357cm/sec and a peak gradient of 67mm Hg. Wall motion was normal; there were no regional wall motion abnormalities. Doppler parameters are consistent with abnormal left ventricular  relaxation (grade 1 diastolic dysfunction). Doppler parameters are consistent with high ventricular filling pressure. - Mitral valve: Moderately calcified annulus. The findings are consistent with mild stenosis. Mean gradient: 5mm Hg (D). - Left atrium: The atrium was mildly dilated. - Pulmonary arteries: Systolic pressure was mildly to moderately increased. PA peak pressure: 34mm Hg (S).  Antibiotics: Vanc 1/16-->1/19 Zosyn 1/16-->1/19 Augmentin 1/19-->  Subjective: Patient feels better. Breathing is improved. Denies chest pain. Back pain is better as well. But continues to have difficulty swallowing. States food gets stuck in chest. More with solids.  Objective: Vital Signs  Filed Vitals:   10/09/13 1826 10/09/13 2112 10/10/13 0152 10/10/13 0540  BP: 149/55 154/43 151/47 135/37  Pulse: 66 75 69 65  Temp: 97.9 F (36.6 C) 98.2 F (36.8 C) 97.9 F (36.6 C) 98.1 F (36.7 C)  TempSrc: Oral Oral Oral Oral  Resp: 18 20 21 19   Height:      Weight:    92.7 kg (204 lb 5.9 oz)  SpO2: 98% 96% 98% 100%    Intake/Output Summary (Last 24 hours) at 10/10/13 1046 Last data filed at 10/10/13 0902  Gross per 24 hour  Intake   1055 ml  Output   2575 ml  Net  -1520 ml   Filed Weights   10/06/13 2131 10/09/13 1300 10/10/13 0540  Weight: 89.846 kg (198 lb 1.2 oz) 93.9 kg (207 lb 0.2 oz) 92.7 kg (204 lb 5.9 oz)    General appearance: alert, cooperative, appears stated age and no distress Resp: Improved  air entry. Less crackles bilaterally without wheezing. Cardio: regular rate and rhythm, S1, S2 normal.  Systolic murmur over precordium. no click, rub or gallop GI: soft, non-tender; bowel sounds normal; no masses,  no organomegaly Extremities: 1+ edema noted.  Pulses: 2+ and symmetric Neurologic: Alert and oriented x 3. No focal deficits.  Lab Results:  Basic Metabolic Panel:  Recent Labs Lab 10/04/13 1350  10/05/13 0400 10/06/13 0356 10/07/13 0410 10/08/13 0540 10/09/13 0250  10/10/13 0350  NA 140  --  144 144 144 144 145 144  K 6.1*  < > 4.8 4.6 4.8 4.3 4.0 4.2  CL 109  --  111 110 110 109 107 106  CO2 17*  --  19 20 20 22 23 25   GLUCOSE 219*  --  113* 71 172* 207* 82 133*  BUN 65*  --  61* 57* 54* 53* 50* 49*  CREATININE 2.82*  --  2.90* 2.79* 2.64* 2.74* 2.87* 3.01*  CALCIUM 7.7*  --  7.6* 7.7* 8.1* 7.9* 8.1* 8.3*  PHOS  --   --  4.3  --   --  3.9  --   --   < > = values in this interval not displayed. Liver Function Tests:  Recent Labs Lab 10/05/13 0400 10/08/13 0540  ALBUMIN 2.2* 2.4*   CBC:  Recent Labs Lab 10/05/13 0400 10/07/13 0410 10/08/13 0540 10/09/13 0250 10/10/13 0350  WBC 8.9 9.9 8.3 8.3 8.8  NEUTROABS  --   --  6.0  --   --   HGB 9.1* 9.4* 8.9* 8.5* 9.4*  HCT 29.7* 31.4* 29.6* 27.9* 30.8*  MCV 101.7* 104.3* 103.1* 102.6* 102.3*  PLT 259 272 260 251 247   Cardiac Enzymes:  Recent Labs Lab 10/04/13 0550 10/06/13 1401 10/07/13 0410 10/07/13 1600 10/09/13 1134  CKTOTAL 495* 212* 168  --   --   TROPONINI  --   --   --  0.36* 0.31*   BNP (last 3 results)  Recent Labs  10/09/13 1134  PROBNP 22117.0*   CBG:  Recent Labs Lab 10/09/13 0803 10/09/13 1204 10/09/13 1545 10/09/13 2107 10/10/13 0608  GLUCAP 107* 157* 200* 313* 148*    Recent Results (from the past 240 hour(s))  URINE CULTURE     Status: None   Collection Time    10/03/13 11:30 PM      Result Value Range Status   Specimen Description URINE, RANDOM   Final   Special Requests NONE   Final   Culture  Setup Time     Final   Value: 10/04/2013 01:10     Performed at [facility]     Final   Value: >=100,000 COLONIES/ML     Performed at [facility]   Culture     Final   Value: ESCHERICHIA COLI     Performed at [facility]   Report Status 10/06/2013 FINAL   Final   Organism ID, Bacteria ESCHERICHIA COLI   Final  MRSA PCR SCREENING     Status: Abnormal   Collection Time    10/07/13  3:08 PM      Result  Value Range Status   MRSA by PCR POSITIVE (*) NEGATIVE Final   Comment:            The GeneXpert MRSA Assay (FDA     approved for NASAL specimens     only), is one component of a     comprehensive MRSA colonization  surveillance program. It is not     intended to diagnose MRSA     infection nor to guide or     monitor treatment for     MRSA infections.     RESULT CALLED TO, READ BACK BY AND VERIFIED WITH:     Rowland Lathe RN 17:00 10/07/13 ([email or web address])      Studies/Results: No results found.  Medications:  Scheduled: . amitriptyline  10 mg Oral QHS  . amoxicillin-clavulanate  800 mg Oral Q12H  . aspirin EC  81 mg Oral Daily  . atorvastatin  20 mg Oral q1800  . febuxostat  40 mg Oral QHS  . [START ON 10/11/2013] furosemide  60 mg Oral BID  . heparin  5,000 Units Subcutaneous Q8H  . insulin aspart  0-9 Units Subcutaneous TID WC  . insulin aspart  5 Units Subcutaneous TID WC  . insulin detemir  12 Units Subcutaneous Daily  . lanthanum  500 mg Oral TID WC  . metoprolol succinate  12.5 mg Oral Daily  . omega-3 acid ethyl esters  1 g Oral Daily  . polyethylene glycol  17 g Oral Daily  . senna-docusate  1 tablet Oral BID  . sodium bicarbonate  650 mg Oral BID  . traMADol  50 mg Oral Q4H  . vitamin B-12  1,000 mcg Oral Daily   Continuous:  [id number], HYDROmorphone (DILAUDID) injection, lip balm, ondansetron, phenol  Assessment/Plan:  Principal Problem:   Acute respiratory failure Active Problems:   CKD (chronic kidney disease) stage 4, GFR 15-29 ml/min   Rhabdomyolysis   Hypoglycemia   UTI (urinary tract infection)   Hyperkalemia   Hypertrophic obstructive cardiomyopathy(425.11)   Fluid overload    Acute Respiratory failure Likely Aspiration Pneumonitis along with fluid overload. Diuresing well. Lungs sound better. Will change to oral Lasix.   Pulmonary Edema Change to oral Lasix. Diuresing well. Elevated troponin likely due to demand ischemia. No further work  up. ECHO has been done.  Aspiration Pneumonitis SLP has seen and on Dysphagia diet. Aspiration precautions. Change to Augmentin Susp.   Dysphagia SLP suspects esophageal etiology and history suggests the same. Will proceed with Barium Swallow for now. Would not want to subject her to EGD considering her rep status unless absolutely necessary.  Rhabdomyolysis CK levels improved. Resolved. Was secondary to fall. Stopped IVF.  Diabetes Mellitus Type 2 with episodes of Hypoglycemia Monitor CBG's closely. On Levemir and SSI. Check A1C. Was on 70/30 at home which should be stopped and changed to Levemir.   Pyelonephritis with E coli Had completed course of abx. Repeat UA was abnormal. Continue abx.  Constipation Continue miralax  Back Pain Status post kyphoplasty to L2 1/15. Seems to be better. Continue PT/OT.   CKD Stage 4 Creatinine is rising slowly. Will change to oral Lasix. Continue to monitor urine output. Monitor creatinine.   History of Hypertrophic cardiomyopathy Stable for most part. ECHO showed normal EF with diastolic dysfunction. She did have a pause earlier this hospitalization. No recurrence seen. Will continue her beta blockers and continue to monitor.   Code Status: DNR  DVT Prophylaxis: Heparin    Family Communication: Discussed with her daughter 1/18  Disposition Plan: Await CIR re-eval. Anticipate discharge -n 1-2 days depending on barium swallow. SNF vs CIR.    LOS: 9 days   [facility] Pager [phone] 10/10/2013, 10:46 AM  If 8PM-8AM, please contact night-coverage at [email or web address], password [facility]

## 2013-10-10 NOTE — Progress Notes (Signed)
PT Cancellation Note  Patient Details Name: [name] [name] MRN: [id number] DOB: Aug 02, 1926   Cancelled Treatment:     PT session cancelled due to pt getting ready to leave floor for swallow test.  Will f/u tomorrow.     Sarajane Marek, [place] [phone] 10/10/2013

## 2013-10-11 ENCOUNTER — Inpatient Hospital Stay (HOSPITAL_COMMUNITY): Payer: Medicare Other

## 2013-10-11 DIAGNOSIS — R1319 Other dysphagia: Secondary | ICD-10-CM | POA: Diagnosis present

## 2013-10-11 DIAGNOSIS — R1310 Dysphagia, unspecified: Secondary | ICD-10-CM

## 2013-10-11 LAB — URINALYSIS, ROUTINE W REFLEX MICROSCOPIC
Bilirubin Urine: NEGATIVE
Glucose, UA: 250 mg/dL — AB
Ketones, ur: NEGATIVE mg/dL
LEUKOCYTES UA: NEGATIVE
NITRITE: NEGATIVE
PROTEIN: 100 mg/dL — AB
SPECIFIC GRAVITY, URINE: 1.012 (ref 1.005–1.030)
UROBILINOGEN UA: 0.2 mg/dL (ref 0.0–1.0)
pH: 5 (ref 5.0–8.0)

## 2013-10-11 LAB — GLUCOSE, CAPILLARY
GLUCOSE-CAPILLARY: 328 mg/dL — AB (ref 70–99)
GLUCOSE-CAPILLARY: 287 mg/dL — AB (ref 70–99)
GLUCOSE-CAPILLARY: 189 mg/dL — AB (ref 70–99)
Glucose-Capillary: 133 mg/dL — ABNORMAL HIGH (ref 70–99)
Glucose-Capillary: 122 mg/dL — ABNORMAL HIGH (ref 70–99)

## 2013-10-11 LAB — BLOOD GAS, ARTERIAL
Acid-Base Excess: 2.8 mmol/L — ABNORMAL HIGH (ref 0.0–2.0)
Bicarbonate: 27.9 mEq/L — ABNORMAL HIGH (ref 20.0–24.0)
Drawn by: 10006
FIO2: 1 %
O2 Saturation: 100 %
PCO2 ART: 51.3 mmHg — AB (ref 35.0–45.0)
PO2 ART: 354 mmHg — AB (ref 80.0–100.0)
Patient temperature: 98.6
TCO2: 29.4 mmol/L (ref 0–100)
pH, Arterial: 7.354 (ref 7.350–7.450)

## 2013-10-11 LAB — CBC
HEMATOCRIT: 29.7 % — AB (ref 36.0–46.0)
HEMOGLOBIN: 9.1 g/dL — AB (ref 12.0–15.0)
MCH: 31.1 pg (ref 26.0–34.0)
MCHC: 30.6 g/dL (ref 30.0–36.0)
MCV: 101.4 fL — ABNORMAL HIGH (ref 78.0–100.0)
Platelets: 253 10*3/uL (ref 150–400)
RBC: 2.93 MIL/uL — ABNORMAL LOW (ref 3.87–5.11)
RDW: 15.9 % — ABNORMAL HIGH (ref 11.5–15.5)
WBC: 9.4 10*3/uL (ref 4.0–10.5)

## 2013-10-11 LAB — RENAL FUNCTION PANEL
Albumin: 2.7 g/dL — ABNORMAL LOW (ref 3.5–5.2)
BUN: 43 mg/dL — ABNORMAL HIGH (ref 6–23)
CALCIUM: 8.7 mg/dL (ref 8.4–10.5)
CO2: 25 meq/L (ref 19–32)
CREATININE: 2.62 mg/dL — AB (ref 0.50–1.10)
Chloride: 104 mEq/L (ref 96–112)
GFR calc non Af Amer: 16 mL/min — ABNORMAL LOW (ref >90)
GFR, EST AFRICAN AMERICAN: 18 mL/min — AB (ref >90)
Glucose, Bld: 215 mg/dL — ABNORMAL HIGH (ref 70–99)
PHOSPHORUS: 2.6 mg/dL (ref 2.3–4.6)
Potassium: 4 mEq/L (ref 3.7–5.3)
SODIUM: 145 meq/L (ref 137–147)

## 2013-10-11 LAB — BASIC METABOLIC PANEL
BUN: 45 mg/dL — AB (ref 6–23)
CHLORIDE: 107 meq/L (ref 96–112)
CO2: 27 mEq/L (ref 19–32)
Calcium: 8.6 mg/dL (ref 8.4–10.5)
Creatinine, Ser: 2.86 mg/dL — ABNORMAL HIGH (ref 0.50–1.10)
GFR calc non Af Amer: 14 mL/min — ABNORMAL LOW (ref >90)
GFR, EST AFRICAN AMERICAN: 16 mL/min — AB (ref >90)
GLUCOSE: 129 mg/dL — AB (ref 70–99)
POTASSIUM: 4.2 meq/L (ref 3.7–5.3)
Sodium: 145 mEq/L (ref 137–147)

## 2013-10-11 LAB — CLOSTRIDIUM DIFFICILE BY PCR: Toxigenic C. Difficile by PCR: NEGATIVE

## 2013-10-11 LAB — URINE MICROSCOPIC-ADD ON

## 2013-10-11 LAB — IRON AND TIBC
Iron: 30 ug/dL — ABNORMAL LOW (ref 42–135)
SATURATION RATIOS: 15 % — AB (ref 20–55)
TIBC: 202 ug/dL — AB (ref 250–470)
UIBC: 172 ug/dL (ref 125–400)

## 2013-10-11 MED ORDER — SODIUM CHLORIDE 0.9 % IV SOLN
INTRAVENOUS | Status: DC
  Administered 2013-10-12: 14:00:00 via INTRAVENOUS

## 2013-10-11 MED ORDER — FUROSEMIDE 10 MG/ML IJ SOLN
60.0000 mg | Freq: Once | INTRAMUSCULAR | Status: AC
  Administered 2013-10-11: 60 mg via INTRAVENOUS

## 2013-10-11 MED ORDER — INSULIN ASPART 100 UNIT/ML ~~LOC~~ SOLN
0.0000 [IU] | SUBCUTANEOUS | Status: DC
  Administered 2013-10-12: 2 [IU] via SUBCUTANEOUS

## 2013-10-11 MED ORDER — FUROSEMIDE 80 MG PO TABS
160.0000 mg | ORAL_TABLET | Freq: Two times a day (BID) | ORAL | Status: DC
  Administered 2013-10-11 – 2013-10-12 (×2): 160 mg via ORAL
  Filled 2013-10-11 (×6): qty 2

## 2013-10-11 MED ORDER — INSULIN ASPART 100 UNIT/ML ~~LOC~~ SOLN: 0.0000 [IU] | SUBCUTANEOUS | Status: DC

## 2013-10-11 MED ORDER — HYDRALAZINE HCL 20 MG/ML IJ SOLN: 5.0000 mg | INTRAMUSCULAR | Status: DC | PRN

## 2013-10-11 MED ORDER — FUROSEMIDE 10 MG/ML IJ SOLN: 80.0000 mg | Freq: Two times a day (BID) | INTRAMUSCULAR | Status: DC

## 2013-10-11 MED ORDER — MORPHINE SULFATE 2 MG/ML IJ SOLN
2.0000 mg | INTRAMUSCULAR | Status: DC | PRN
  Administered 2013-10-11 – 2013-10-13 (×2): 2 mg via INTRAVENOUS
  Filled 2013-10-11 (×2): qty 1

## 2013-10-11 MED ORDER — HYDRALAZINE HCL 20 MG/ML IJ SOLN
INTRAMUSCULAR | Status: AC
  Administered 2013-10-11: 20 mg
  Filled 2013-10-11: qty 1

## 2013-10-11 MED ORDER — MORPHINE SULFATE 2 MG/ML IJ SOLN
2.0000 mg | INTRAMUSCULAR | Status: AC
  Administered 2013-10-11: 2 mg via INTRAVENOUS
  Filled 2013-10-11: qty 1

## 2013-10-11 MED ORDER — FUROSEMIDE 10 MG/ML IJ SOLN
60.0000 mg | Freq: Once | INTRAMUSCULAR | Status: AC
  Administered 2013-10-12: 60 mg via INTRAVENOUS
  Filled 2013-10-11: qty 6

## 2013-10-11 MED ORDER — AMLODIPINE BESYLATE 10 MG PO TABS
10.0000 mg | ORAL_TABLET | Freq: Every day | ORAL | Status: DC
  Administered 2013-10-11 – 2013-10-14 (×4): 10 mg via ORAL
  Filled 2013-10-11 (×4): qty 1

## 2013-10-11 MED ORDER — HYDRALAZINE HCL 20 MG/ML IJ SOLN: 10.0000 mg | Freq: Once | INTRAMUSCULAR | Status: DC

## 2013-10-11 MED ORDER — AMOXICILLIN-POT CLAVULANATE 250-62.5 MG/5ML PO SUSR
500.0000 mg | Freq: Two times a day (BID) | ORAL | Status: DC
  Administered 2013-10-13 – 2013-10-14 (×4): 500 mg via ORAL
  Filled 2013-10-11 (×7): qty 10

## 2013-10-11 NOTE — Consult Note (Signed)
[facility] Consult: 11:39 AM 10/11/2013  LOS: 10 days    Referring Provider: Dr Curly Rim  Primary Care Physician:  Ann Held, MD Primary Gastroenterologist:  Althia Forts.  Dr Melina Copa Renal:  Dr Posey Pronto     Reason for Consultation:  dysphagia   HPI: [name] [name] is a 80 y.o. female.  Has stage 4 to 5 CKD, IDDM for many years, recent addition of narcotics at home for MS pain.   Admitted 10 days ago for pain management after suffering lumbar compression fracture, developed mild rhabdo from being down after fall.  Other inpt problems included hypoglycemia, UTI/Pyelo treated with Rocephin, constipation treated with Miralax.  Underwent L2 kyphoplasty and bone biopsy 10/06/12.  Rapid response called 1/16 for hypoxia.  Require NRB mask. Pt then refused Bipap.   Pt c/o dysphagia.   SLP bedside eval on 1/17: "No outward clinical s/s of aspiration noted with trials of water by cup and straw and puree consistency. Immediate gagging and coughing s/p swallow of mechanical soft solid with reports of bolus feeling "stuck". Excessive burping s/p trials. S/s more suspected esophageal based but will recommend to downgrade to dysphagia 1 (puree) as tolerated mainly for comfort. Continue thin liquids" Barium esophagram yesterday shows moderate esophageal dysmotility, HH and no stricture or narrowing in esophagus.  No previous dysphagia.  No previous EGD.  No nausea.  Having small volume fecal incontinence because of new laxatives added to to new onset constipation from use of narcotics at home.   Transfused in past due to renal disease induced anemia and post knee replacement 2010.  Biweekly CBC and check to see if needs epo.  Renal MD oversees this.       Past Medical History  Diagnosis Date  . Diabetes mellitus    mellitus? x25 years  . HTN (hypertension)     x2 years  . Hypercholesterolemia     x 2 years  . Renal insufficiency     mild to moderate  . Anemia     chronic anemia due to chronic disease  . CAD (coronary artery disease)     nonobstructive   . GERD (gastroesophageal reflux disease)     Past Surgical History  Procedure Laterality Date  . Total knee arthroplasty      L-2006; R-2008   . Appendectomy    . Back surgery      lower back  . Transthoracic echocardiogram  05/2011  . Av fistula placement  07/07/2012    Procedure: ARTERIOVENOUS (AV) FISTULA CREATION;  Surgeon: Mal Misty, MD;  Location: [facility] OR;  Service: Vascular;  Laterality: Right;  Brachial - cephalic arterivenous fistula    Prior to Admission medications   Medication Sig Start Date End Date Taking? Authorizing Provider  amitriptyline (ELAVIL) 10 MG tablet Take 10 mg by mouth at bedtime.    Yes Historical Provider, MD  aspirin EC 81 MG tablet Take 81 mg by mouth daily.   Yes Historical Provider, MD  atorvastatin (LIPITOR) 20 MG tablet Take 20 mg by mouth daily.   Yes  Historical Provider, MD  Calcium-Magnesium-Vitamin D (CALCIUM 500 PO) Take 500 mg by mouth daily.   Yes Historical Provider, MD  CINNAMON PO Take 2 tablets by mouth daily.   Yes Historical Provider, MD  diazepam (VALIUM) 5 MG tablet Take 5 mg by mouth every 12 (twelve) hours as needed for anxiety.   Yes Historical Provider, MD  febuxostat (ULORIC) 40 MG tablet Take 40 mg by mouth at bedtime.   Yes Historical Provider, MD  fish oil-omega-3 fatty acids 1000 MG capsule Take 1 g by mouth daily.    Yes Historical Provider, MD  furosemide (LASIX) 40 MG tablet Take 40 mg by mouth 2 (two) times daily.    Yes Historical Provider, MD  GARLIC PO Take 1 tablet by mouth daily.   Yes Historical Provider, MD  Glucosamine-Chondroitin 250-200 MG CAPS Take 1 capsule by mouth daily.     Yes Historical Provider, MD  HYDROmorphone (DILAUDID) 4 MG tablet Take 4 mg by mouth  every 4 (four) hours as needed for severe pain.   Yes Historical Provider, MD  insulin aspart protamine- aspart (NOVOLOG MIX 70/30) (70-30) 100 UNIT/ML injection Inject 19-29 Units into the skin 2 (two) times daily with a meal. Uses 29 units in the morning and 19 units at night   Yes Historical Provider, MD  lanthanum (FOSRENOL) 500 MG chewable tablet Chew 500 mg by mouth 3 (three) times daily with meals.   Yes Historical Provider, MD  metoprolol succinate (TOPROL-XL) 25 MG 24 hr tablet Take 12.5 mg by mouth daily.    Yes Historical Provider, MD  Misc Natural Products (OSTEO BI-FLEX TRIPLE STRENGTH PO) Take 1 tablet by mouth daily.   Yes Historical Provider, MD  nateglinide (STARLIX) 60 MG tablet Take 60 mg by mouth every morning. Takes prior to large meal   Yes Historical Provider, MD  sodium bicarbonate 650 MG tablet Take 650 mg by mouth 2 (two) times daily.   Yes Historical Provider, MD  traMADol (ULTRAM-ER) 100 MG 24 hr tablet Take 100-200 mg by mouth daily as needed for pain.   Yes Historical Provider, MD  vitamin B-12 (CYANOCOBALAMIN) 1000 MCG tablet Take 1,000 mcg by mouth daily.   Yes Historical Provider, MD    Scheduled Meds: . amitriptyline  10 mg Oral QHS  . amoxicillin-clavulanate  500 mg Oral Q12H  . aspirin EC  81 mg Oral Daily  . atorvastatin  20 mg Oral q1800  . diltiazem  30 mg Oral Q8H  . febuxostat  40 mg Oral QHS  . furosemide  60 mg Oral BID  . heparin  5,000 Units Subcutaneous Q8H  . insulin aspart  0-9 Units Subcutaneous TID WC  . insulin aspart  5 Units Subcutaneous TID WC  . insulin detemir  12 Units Subcutaneous Daily  . lanthanum  500 mg Oral TID WC  . metoprolol succinate  12.5 mg Oral Daily  . pantoprazole  40 mg Oral Q1200  . polyethylene glycol  17 g Oral Daily  . senna-docusate  1 tablet Oral BID  . sodium bicarbonate  650 mg Oral BID  . sucralfate  1 g Oral TID WC & HS  . traMADol  50 mg Oral Q4H  . vitamin B-12  1,000 mcg Oral Daily   Infusions:     PRN Meds: diazepam, HYDROmorphone (DILAUDID) injection, lip balm, ondansetron, phenol   Allergies as of 10/01/2013  . (No Known Allergies)    Family History  Problem Relation Age of Onset  .  Heart attack Father     History   Social History  . Marital Status: Widowed    Spouse Name: N/A    Number of Children: N/A  . Years of Education: N/A   Occupational History  . Not on file.   Social History Main Topics  . Smoking status: Never Smoker   . Smokeless tobacco: Never Used     Comment: no smoking   . Alcohol Use: No  . Drug Use: No  . Sexual Activity: Not Currently    Birth Control/ Protection: Post-menopausal   Other Topics Concern  . Not on file   Social History Narrative   Widowed. Retired and lives with her daughter.     REVIEW OF SYSTEMS: Constitutional:  Stable weight.  Lived independently, driving, cleaning, cooking until recent severe pain ENT:  No nose bleeds Pulm:  No cough or dyspnea CV:  No palpitations, no LE edema.  GU:  No hematuria, no frequency GI:  Fecal incontinence with all new laxatives added Heme:  Per HPI   Transfusions:  Per HPI Neuro:  No headaches, no peripheral tingling or numbness Derm:  Lots of rash in skin folds of groin.  Endocrine:  No sweats or chills.  Drinks a lot of water Immunization:  Flu shot up to date.  Travel:  None beyond local counties in last few months.    PHYSICAL EXAM: Vital signs in last 24 hours: Filed Vitals:   10/11/13 0907  BP: 175/43  Pulse: 78  Temp:   Resp:    Wt Readings from Last 3 Encounters:  10/11/13 91.763 kg (202 lb 4.8 oz)  08/17/12 83.462 kg (184 lb)  07/06/12 83.462 kg (184 lb)   General: obese, pleasant, comfortable elderly WF Head:  No asymmetry or trauma  Eyes:  No icterus or pallor.  EOMI Ears:  Not HOH  Nose:  No congestion Mouth:  Moist and clear oral MM.  Dentures in place Neck:  No mass, no JVD Lungs:  Diminished BS at left base.  No cough or resp distress Heart: RRR.   No MRG Abdomen:  Obese, pannus covers upper thighs.  Not tender, no HSM.  No bruits.   Rectal: not performed   Musc/Skeltl: no joint swellilng Extremities:  Non-pitting edema in LE  Neurologic:  Oriented x 3.  No tremor.  No limb weakness Skin:  Rash in intertrigo of left thigh Tattoos:  none Nodes:  No cervical adenopathy   Psych:  Pleasant, relaxed, affect blunted.   Intake/Output from previous day: 01/19 0701 - 01/20 0700 In: 1320 [P.O.:720] Out: 600 [Urine:600] Intake/Output this shift: Total I/O In: -  Out: 300 [Urine:300]  LAB RESULTS:  Recent Labs  10/09/13 0250 10/10/13 0350 10/11/13 0358  WBC 8.3 8.8 9.4  HGB 8.5* 9.4* 9.1*  HCT 27.9* 30.8* 29.7*  PLT 251 247 253   BMET Lab Results  Component Value Date   NA 145 10/11/2013   NA 144 10/10/2013   NA 145 10/09/2013   K 4.2 10/11/2013   K 4.2 10/10/2013   K 4.0 10/09/2013   CL 107 10/11/2013   CL 106 10/10/2013   CL 107 10/09/2013   CO2 27 10/11/2013   CO2 25 10/10/2013   CO2 23 10/09/2013   GLUCOSE 129* 10/11/2013   GLUCOSE 133* 10/10/2013   GLUCOSE 82 10/09/2013   BUN 45* 10/11/2013   BUN 49* 10/10/2013   BUN 50* 10/09/2013   CREATININE 2.86* 10/11/2013   CREATININE 3.01* 10/10/2013  CREATININE 2.87* 10/09/2013   CALCIUM 8.6 10/11/2013   CALCIUM 8.3* 10/10/2013   CALCIUM 8.1* 10/09/2013   LFT No results found for this basename: PROT, ALBUMIN, AST, ALT, ALKPHOS, BILITOT, BILIDIR, IBILI,  in the last 72 hours PT/INR Lab Results  Component Value Date   INR 1.07 10/06/2013   INR 1.0 04/09/2009   INR 1.0 RATIO 12/08/2006     RADIOLOGY STUDIES: Dg Esophagus 10/10/2013   CLINICAL DATA:  Dysphagia  EXAM: ESOPHOGRAM/BARIUM SWALLOW  TECHNIQUE: Single contrast examination was performed using  thin barium.  COMPARISON:  None.  FLUOROSCOPY TIME:  49 seconds  FINDINGS: Moderate esophageal dysmotility with numerous non-peristaltic/tertiary contractions.  No definite fixed esophageal narrowing or stricture. However, a barium  tablet was not administered since the patient could not stand upright.  Small hiatal hernia.  Gastroesophageal reflux could not be assessed due to the patient's inability to clear her esophagus.  IMPRESSION: Moderate esophageal dysmotility.  No definite fixed esophageal narrowing or stricture.  Small hiatal hernia.   Electronically Signed   By: Julian Hy M.D.   On: 10/10/2013 14:38   Dg Chest Port 1 View 10/11/2013   CLINICAL DATA:  Short of breath.  Difficulty swelling.  EXAM: PORTABLE CHEST - 1 VIEW  COMPARISON:  10/08/2013 and 10/01/2013  FINDINGS: There are irregularly thickened interstitial markings bilaterally suggesting interstitial edema superimposed on chronic interstitial thickening. There are no areas of focal lung consolidation. No pleural effusion or pneumothorax is seen.  Cardiac silhouette is top-normal in size. No convincing mediastinal or hilar masses.  IMPRESSION: Irregular interstitial thickening increased from the prior exam suggesting interstitial edema superimposed on chronic interstitial thickening.   Electronically Signed   By: Lajean Manes M.D.   On: 10/11/2013 09:38    ENDOSCOPIC STUDIES: Colonoscopies by Dr Melina Copa Daughter and pt say never any polyps, diverticulosis found Last colon was > 5 years ago  IMPRESSION:   *  Esophageal dysphagia with dysmotility confirmed on esopharam.  *  S/P lumbar vertebroplasty *  Fecal incontinence since being started on multiple laxatives.  *  Late stage CKD *  Chronic anemia due to CKD.  Gets procrit/epo injections about every 2 weeks prn, in [place].  *  obesity     PLAN:     *  egd tomorrow. Rule out esophagitis, rule out stricture. Hold heparin after last dose tonite.  *  Stop Miralax and senakot.    Azucena Freed  10/11/2013, 11:39 AM Pager: [phone] Attending MD note:   I have taken a history, examined the patient, and reviewed the chart. I agree with the Advanced Practitioner's impression and  recommendations. This is a new problem, daughter reports no swallowing difficulties prior to this admission.So far, she has eaten only  In bed in a semirecumbent position as per daughter's report. Consider Candida esophagitis. Will proceed with EGD tomorrow.  [facility] Pager # [phone]

## 2013-10-11 NOTE — Consult Note (Signed)
[facility] CONSULT NOTE    Date: 10/11/2013                  Patient Name:  [name] [name]  MRN: [id number]  DOB: [birth year]  Age / Sex: 80 y.o., female         PCP: Ann Held, MD                 Service Requesting Consult: Internal Medicine                 Reason for Consult: CKD stage IV            History of Present Illness: [name] [name] is an 80 y.o. female with history of DM, HTN, CKD stage 4, who sustained a fall 10/01/13 and laid on the floor for about 8 hours.  Pt found to have L2 compression fx, had kyphoplasty on 1/15 with aspiration on 1/16 with subsequent sepsis.  Pt states she was attempting to use the bathroom in the middle of the night when she fell and was unable to get up.  She fell onto her back and was down for an estimated 8 hrs per her.  At that time, her daughter found her and took her to [facility], where she was transferred for further evaluation.  Pt states she follows with Dr. Posey Pronto in the outpatient setting and last appointment was 08/09/2013, and has been getting Feraheme or Procrit in the outpatient setting with her last labs checked in 05/2013.  Currently, pt states she feels more SOB but denies any fever, chills, sweats, nausea, vomiting, diarrhea, dysuria, hematuria, abdominal pain.     Medications: Outpatient medications: Prescriptions prior to admission  Medication Sig Dispense Refill  . amitriptyline (ELAVIL) 10 MG tablet Take 10 mg by mouth at bedtime.       Marland Kitchen aspirin EC 81 MG tablet Take 81 mg by mouth daily.      Marland Kitchen atorvastatin (LIPITOR) 20 MG tablet Take 20 mg by mouth daily.      . Calcium-Magnesium-Vitamin D (CALCIUM 500 PO) Take 500 mg by mouth daily.      Marland Kitchen CINNAMON PO Take 2 tablets by mouth daily.      . diazepam (VALIUM) 5 MG tablet Take 5 mg by mouth every 12 (twelve) hours as needed for anxiety.      . febuxostat (ULORIC) 40 MG tablet Take 40 mg by mouth at bedtime.      . fish oil-omega-3 fatty acids  1000 MG capsule Take 1 g by mouth daily.       . furosemide (LASIX) 40 MG tablet Take 40 mg by mouth 2 (two) times daily.       Marland Kitchen GARLIC PO Take 1 tablet by mouth daily.      . Glucosamine-Chondroitin 250-200 MG CAPS Take 1 capsule by mouth daily.        Marland Kitchen HYDROmorphone (DILAUDID) 4 MG tablet Take 4 mg by mouth every 4 (four) hours as needed for severe pain.      Marland Kitchen insulin aspart protamine- aspart (NOVOLOG MIX 70/30) (70-30) 100 UNIT/ML injection Inject 19-29 Units into the skin 2 (two) times daily with a meal. Uses 29 units in the morning and 19 units at night      . lanthanum (FOSRENOL) 500 MG chewable tablet Chew 500 mg by mouth 3 (three) times daily with meals.      . metoprolol succinate (TOPROL-XL) 25 MG 24 hr tablet Take 12.5  mg by mouth daily.       . Misc Natural Products (OSTEO BI-FLEX TRIPLE STRENGTH PO) Take 1 tablet by mouth daily.      . nateglinide (STARLIX) 60 MG tablet Take 60 mg by mouth every morning. Takes prior to large meal      . sodium bicarbonate 650 MG tablet Take 650 mg by mouth 2 (two) times daily.      . traMADol (ULTRAM-ER) 100 MG 24 hr tablet Take 100-200 mg by mouth daily as needed for pain.      . vitamin B-12 (CYANOCOBALAMIN) 1000 MCG tablet Take 1,000 mcg by mouth daily.        Current medications: Current Facility-Administered Medications  Medication Dose Route Frequency Provider Last Rate Last Dose  . amitriptyline (ELAVIL) tablet 10 mg  10 mg Oral QHS Jared M Gardner, DO   10 mg at 10/10/13 2115  . amoxicillin-clavulanate (AUGMENTIN) 250-62.5 MG/5ML suspension 500 mg  500 mg Oral Q12H Gokul Krishnan, MD      . aspirin EC tablet 81 mg  81 mg Oral Daily Jared M Gardner, DO   81 mg at 10/11/13 0908  . atorvastatin (LIPITOR) tablet 20 mg  20 mg Oral q1800 Jared M Gardner, DO   20 mg at 10/10/13 2115  . diazepam (VALIUM) tablet 2 mg  2 mg Oral Q12H PRN Clanford L Johnson, MD   2 mg at 10/04/13 0219  . diltiazem (CARDIZEM) tablet 30 mg  30 mg Oral Q8H Gokul  Krishnan, MD   30 mg at 10/11/13 1333  . febuxostat (ULORIC) tablet 40 mg  40 mg Oral QHS Jared M Gardner, DO   40 mg at 10/10/13 2115  . furosemide (LASIX) injection 80 mg  80 mg Intravenous Q12H Gokul Krishnan, MD      . heparin injection 5,000 Units  5,000 Units Subcutaneous Q8H Sarah J Gribbin, PA-C   5,000 Units at 10/11/13 1400  . HYDROmorphone (DILAUDID) injection 1 mg  1 mg Intravenous Q4H PRN Jai-Gurmukh Samtani, MD   1 mg at 10/08/13 1557  . insulin aspart (novoLOG) injection 0-9 Units  0-9 Units Subcutaneous TID WC Clanford L Johnson, MD   7 Units at 10/11/13 1239  . insulin aspart (novoLOG) injection 5 Units  5 Units Subcutaneous TID WC Clanford L Johnson, MD   5 Units at 10/11/13 1240  . insulin detemir (LEVEMIR) injection 12 Units  12 Units Subcutaneous Daily Clanford L Johnson, MD   12 Units at 10/11/13 1100  . lanthanum (FOSRENOL) chewable tablet 500 mg  500 mg Oral TID WC Jared M Gardner, DO   500 mg at 10/11/13 1100  . lip balm (BLISTEX) ointment   Topical PRN Clanford L Johnson, MD      . metoprolol succinate (TOPROL-XL) 24 hr tablet 12.5 mg  12.5 mg Oral Daily Jared M Gardner, DO   12.5 mg at 10/11/13 0908  . ondansetron (ZOFRAN) injection 4 mg  4 mg Intravenous Q6H PRN Thomas Michael Callahan, NP   4 mg at 10/05/13 1518  . pantoprazole (PROTONIX) EC tablet 40 mg  40 mg Oral Q1200 Gokul Krishnan, MD   40 mg at 10/11/13 1100  . phenol (CHLORASEPTIC) mouth spray 1 spray  1 spray Mouth/Throat TID PRN Gokul Krishnan, MD      . sodium bicarbonate tablet 650 mg  650 mg Oral BID Jared M Gardner, DO   650 mg at 10/11/13 0907  . traMADol (ULTRAM) tablet 50 mg  50 mg Oral   Q4H Nita Sells, MD   50 mg at 10/11/13 1239  . vitamin B-12 (CYANOCOBALAMIN) tablet 1,000 mcg  1,000 mcg Oral Daily Etta Quill, DO   1,000 mcg at 10/11/13 0907      Allergies: No Known Allergies    Past Medical History: Past Medical History  Diagnosis Date  . Diabetes mellitus     mellitus? x25  years  . HTN (hypertension)     x2 years  . Hypercholesterolemia     x 2 years  . Renal insufficiency     mild to moderate  . Anemia     chronic anemia due to chronic disease  . CAD (coronary artery disease)     nonobstructive   . GERD (gastroesophageal reflux disease)      Past Surgical History: Past Surgical History  Procedure Laterality Date  . Total knee arthroplasty      L-2006; R-2008   . Appendectomy    . Back surgery      lower back  . Transthoracic echocardiogram  05/2011  . Av fistula placement  07/07/2012    Procedure: ARTERIOVENOUS (AV) FISTULA CREATION;  Surgeon: Mal Misty, MD;  Location: [facility] OR;  Service: Vascular;  Laterality: Right;  Brachial - cephalic arterivenous fistula     Family History: Family History  Problem Relation Age of Onset  . Heart attack Father      Social History: History   Social History  . Marital Status: Widowed    Spouse Name: N/A    Number of Children: N/A  . Years of Education: N/A   Occupational History  . Not on file.   Social History Main Topics  . Smoking status: Never Smoker   . Smokeless tobacco: Never Used     Comment: no smoking   . Alcohol Use: No  . Drug Use: No  . Sexual Activity: Not Currently    Birth Control/ Protection: Post-menopausal   Other Topics Concern  . Not on file   Social History Narrative   Widowed. Retired and lives with her daughter.      Review of Systems: As per HPI  Vital Signs: Blood pressure 174/48, pulse 76, temperature 97.9 F (36.6 C), temperature source Oral, resp. rate 18, height 5' 6" (1.676 m), weight 202 lb 4.8 oz (91.763 kg), SpO2 98.00%.  Weight trends: Filed Weights   10/09/13 1300 10/10/13 0540 10/11/13 0628  Weight: 207 lb 0.2 oz (93.9 kg) 204 lb 5.9 oz (92.7 kg) 202 lb 4.8 oz (91.763 kg)    Physical Exam: Gen: minimal distress with labored breathing HEENT: [place]/AT, MMM Neck: No JVD evident Cardio: RRR, 2/6 SEM holosystolic RUSB  Lungs: increased  WOB, Rales Bibasilar, no wheezes mod dyspnea Abdomen: Soft, NT/ND, NABS, no CVA tenderness Extremities: Trace pedal edema B/L, + sacral edema Neuro: AAO x 3, no focal deficits, no asterixis   Lab results: Basic Metabolic Panel:  Recent Labs Lab 10/05/13 0400  10/08/13 0540 10/09/13 0250 10/10/13 0350 10/11/13 0358  NA 144  < > 144 145 144 145  K 4.8  < > 4.3 4.0 4.2 4.2  CL 111  < > 109 107 106 107  CO2 19  < > _0 GLUCOSE 113*  < > 207* 82 133* 129*  BUN 61*  < > 53* 50* 49* 45*  CREATININE 2.90*  < > 2.74* 2.87* 3.01* 2.86*  CALCIUM 7.6*  < > 7.9* 8.1* 8.3* 8.6  PHOS 4.3  --  3.9  --   --   --   < > =  values in this interval not displayed.  Liver Function Tests:  Recent Labs Lab 10/05/13 0400 10/08/13 0540  ALBUMIN 2.2* 2.4*   CBC:  Recent Labs Lab 10/07/13 0410 10/08/13 0540 10/09/13 0250 10/10/13 0350 10/11/13 0358  WBC 9.9 8.3 8.3 8.8 9.4  NEUTROABS  --  6.0  --   --   --   HGB 9.4* 8.9* 8.5* 9.4* 9.1*  HCT 31.4* 29.6* 27.9* 30.8* 29.7*  MCV 104.3* 103.1* 102.6* 102.3* 101.4*  PLT 272 260 251 247 253    Cardiac Enzymes:  Recent Labs Lab 10/06/13 1401 10/07/13 0410 10/07/13 1600 10/09/13 1134  CKTOTAL 212* 168  --   --   TROPONINI  --   --  0.36* 0.31*   CBG:  Recent Labs Lab 10/10/13 1110 10/10/13 1622 10/10/13 2141 10/11/13 0553 10/11/13 1207  GLUCAP 147* 116* 136* 133* 328*    Microbiology: Results for orders placed during the hospital encounter of 10/01/13  URINE CULTURE     Status: None   Collection Time    10/03/13 11:30 PM      Result Value Range Status   Specimen Description URINE, RANDOM   Final   Special Requests NONE   Final   Culture  Setup Time     Final   Value: 10/04/2013 01:10     Performed at [facility]   Colony Count     Final   Value: >=100,000 COLONIES/ML     Performed at [facility]   Culture     Final   Value: ESCHERICHIA COLI     Performed at [facility]   Report  Status 10/06/2013 FINAL   Final   Organism ID, Bacteria ESCHERICHIA COLI   Final  MRSA PCR SCREENING     Status: Abnormal   Collection Time    10/07/13  3:08 PM      Result Value Range Status   MRSA by PCR POSITIVE (*) NEGATIVE Final   Comment:            The GeneXpert MRSA Assay (FDA     approved for NASAL specimens     only), is one component of a     comprehensive MRSA colonization     surveillance program. It is not     intended to diagnose MRSA     infection nor to guide or     monitor treatment for     MRSA infections.     RESULT CALLED TO, READ BACK BY AND VERIFIED WITH:     E. STANFIELD RN 17:00 10/07/13 ([email or web address])    Coagulation Studies: No results found for this basename: LABPROT, INR,  in the last 72 hours  Urinalysis: No results found for this basename: COLORURINE, APPERANCEUR, LABSPEC, PHURINE, GLUCOSEU, HGBUR, BILIRUBINUR, KETONESUR, PROTEINUR, UROBILINOGEN, NITRITE, LEUKOCYTESUR,  in the last 72 hours    Imaging: Dg Esophagus  10/10/2013 IMPRESSION: Moderate esophageal dysmotility.  No definite fixed esophageal narrowing or stricture.  Small hiatal hernia.   Electronically Signed   By: Sriyesh  Krishnan M.D.   On: 10/10/2013 14:38   Dg Chest Port 1 View  10/11/2013    IMPRESSION: Irregular interstitial thickening increased from the prior exam suggesting interstitial edema superimposed on chronic interstitial thickening.   Electronically Signed   By: David  Ormond M.D.   On: 10/11/2013 09:38   Assessment & Plan: Adhira G Carmical is an 80 y.o. female with history of DM, HTN, CKD stage 4, who sustained a fall 10/01/13   and laid on the floor for about 8 hrs.   1) CKD stage IV - Pt may be at baseline, with no renal evidence of rhabdomyolysis,  As well, no evidence of electrolyte or acidemia on BMET.  AVF in R arm placed around 9-10 months ago per pt.  Pt would undergo dialysis when the time is necessary.  Pt on Bicarb and Fosrenol in the outpatient setting. No evidence  AKI, but CKD and vol xs iatrogenic. Needs diuresis.  Will try to review all other renal issues.  2) Fluid overload secondary to iatrogenic causes - Weight elevated 10 lbs (202 from 92) and up 7L since admission  3) Diabetes Mellitus - SSI and Levemir  4) UTI - on Rocephin  5) Kyphoplasty s/p L2 compression fx with acute pain - Per primary  6) HTN  7) Anemia, Macrocytic vs anemia of renal disease - Receiving Procrit q 2 weeks in outpt setting  8) Dysphagia - Planned EGD tomorrow r/o stricture/esophagitis   Plan: 1) Obtain Renal US, none on file.  UA from 1/17 looks to be clearing, will repeat  2) Will obtain PTH, Phos, Iron studies in CKD pt and tx accordingly for sec PTH 3) Continue diuresis 160 mg TID, up 10 lbs/7L since admission.  Monitor I/O, BP, weights 4) Trend creatinine and eGFR 5) Obtain B12/Folate for macrocytosis   Bryan R. Hess, DO of [facility] 10/11/2013, 3:17 PM I have seen and examined this patient and agree with the plan of care seen, eval, examined, and discussed.  Old records obtained from office. .  Roch Quach L 10/11/2013, 4:21 PM

## 2013-10-11 NOTE — Progress Notes (Signed)
Physical Therapy Treatment Patient Details Name: [name] [name] MRN: [id number] DOB: 14-Jun-1926 Today's Date: 10/11/2013 Time: 3086-5784 PT Time Calculation (min): 20 min  PT Assessment / Plan / Recommendation  History of Present Illness patient admitted s/p fall.  Patient with recent fall with L2 compression fx and h/o CKD.  Now with rhabdo.  w/p kyphoplasty 1/15   PT Comments   Pt admitted with above. Pt currently with functional limitations due to balance and endurance deficits.  Pt needs SNF for therapy due to deconditioned.  Pt will benefit from skilled PT to increase their independence and safety with mobility to allow discharge to the venue listed below.   Follow Up Recommendations  SNF;Supervision/Assistance - 24 hour                 Equipment Recommendations  Rolling walker with 5" wheels;3in1 (PT)        Frequency Min 3X/week   Progress towards PT Goals Progress towards PT goals: Progressing toward goals  Plan Current plan remains appropriate    Precautions / Restrictions Precautions Precautions: Fall;Back Precaution Comments: Compression fracture - pain with mobility Restrictions Weight Bearing Restrictions: No Other Position/Activity Restrictions: Pt has back brace in her closet for comfort but does not like to wear it.   Pertinent Vitals/Pain O2 sats 87-90% with O2 removed.  O292-95% with O2 at 2L.  No pain.    Mobility  Bed Mobility Overal bed mobility: Needs Assistance Bed Mobility: Supine to Sit Supine to sit: Mod assist General bed mobility comments: cues for sequencing, truncal assist Transfers Overall transfer level: Needs assistance Equipment used: 2 person hand held assist Transfers: Sit to/from Stand;Stand Pivot Transfers Sit to Stand: Max assist;+2 physical assistance Stand pivot transfers: Mod assist;+2 physical assistance General transfer comment: Pt able to stand with +1 assist but could not stand completly upright.  Pt requires +2 assist to  transfer stand pivot for safety and due to postural instability and poor ability to weight shift.        Exercises General Exercises - Lower Extremity Ankle Circles/Pumps: AROM;Both;20 reps;Supine Heel Slides: AROM;Both;Supine;15 reps   PT Goals (current goals can now be found in the care plan section) Acute Rehab PT Goals Patient Stated Goal: go back home  Visit Information  Last PT Received On: 10/11/13 Assistance Needed: +2 History of Present Illness: patient admitted s/p fall.  Patient with recent fall with L2 compression fx and h/o CKD.  Now with rhabdo.  w/p kyphoplasty 1/15    Subjective Data  Subjective: "I feel better but still weak." Patient Stated Goal: go back home   Cognition  Cognition Arousal/Alertness: Awake/alert Behavior During Therapy: WFL for tasks assessed/performed Overall Cognitive Status: Within Functional Limits for tasks assessed Safety/Judgement: Decreased awareness of safety General Comments: Daughter does not seem to be happy with care she is getting.  Family called for assist.  No assist available in 1 minute so she went to desk to get assist.  Explained that sometimes nursing may be aminute bc they are in a different room.  Pt asked to talk to [place] located and brought to room to to talk to daughter.    Balance  Balance Overall balance assessment: Needs assistance;History of Falls Sitting-balance support: No upper extremity supported;Feet supported Sitting balance-Leahy Scale: Fair Sitting balance - Comments: tended to list R moderately and needed v/tc's to return and hold midline Postural control: Posterior lean;Right lateral lean Standing balance support: Bilateral upper extremity supported;During functional activity Standing balance-Leahy Scale:  Poor Standing balance comment: Pt needs cues for hip and knee extension for upright posture.   End of Session PT - End of Session Equipment Utilized During Treatment: Gait belt;Oxygen Activity  Tolerance: Patient limited by fatigue Patient left: in chair;with call bell/phone within reach Nurse Communication: Mobility status;Need for lift equipment        INGOLD,Philip Eckersley 10/11/2013, 1:59 PM [facility] Acute Rehabilitation [phone] (pager)

## 2013-10-11 NOTE — Progress Notes (Signed)
Occupational Therapy Treatment Patient Details Name: [name] [name] MRN: [id number] DOB: 1926/06/08 Today's Date: 10/11/2013 Time: 5462-7035 OT Time Calculation (min): 39 min  OT Assessment / Plan / Recommendation  History of present illness patient admitted s/p fall.  Patient with recent fall with L2 compression fx and h/o CKD.  Now with rhabdo.  w/p kyphoplasty 1/15   OT comments  Pt slowly improving but cont to require +2 assist for most mobility.  Pt fatigues very quickly.  Pt may be best served by SNF rehab due to her poor activity tolerance.  Follow Up Recommendations  SNF    Barriers to Discharge       Equipment Recommendations  None recommended by OT    Recommendations for Other Services    Frequency Min 2X/week   Progress towards OT Goals Progress towards OT goals: Progressing toward goals  Plan Discharge plan needs to be updated    Precautions / Restrictions Precautions Precautions: Fall;Back Precaution Comments: Compression fracture - pain with mobility Restrictions Weight Bearing Restrictions: No Other Position/Activity Restrictions: Pt has back brace in her closet for comfort but does not like to wear it.   Pertinent Vitals/Pain Pt c/o 4/10 pain in back    ADL  Lower Body Dressing: Performed;Maximal assistance Where Assessed - Lower Body Dressing: Unsupported sitting;Supported sit to stand Toilet Transfer: Performed;+2 Total assistance Toilet Transfer: Patient Percentage: 50% Toilet Transfer Method: Sit to Loss adjuster, chartered: Bedside commode Toileting - Clothing Manipulation and Hygiene: +1 Total assistance Where Assessed - Camera operator Manipulation and Hygiene: Standing Equipment Used: Gait belt;Rolling walker Transfers/Ambulation Related to ADLs: +2 assist for safety with all pivot transfers. ADL Comments: Pt has reacher and sock aid at home.  Pt may need to use these for LE dressing for the long run due to B knee replacements, old back  surgery and new kyphoplasty.  WIll reeducated pt on use of this equipment.    OT Diagnosis:    OT Problem List:   OT Treatment Interventions:     OT Goals(current goals can now be found in the care plan section) Acute Rehab OT Goals Patient Stated Goal: go back home OT Goal Formulation: With patient Time For Goal Achievement: 10/17/13 Potential to Achieve Goals: Good ADL Goals Pt Will Perform Grooming: with supervision;standing Pt Will Perform Lower Body Bathing: with supervision;with adaptive equipment;sit to/from stand Pt Will Perform Lower Body Dressing: with supervision;with adaptive equipment;sit to/from stand Pt Will Transfer to Toilet: with supervision;ambulating;regular height toilet;bedside commode Pt Will Perform Toileting - Clothing Manipulation and hygiene: with supervision;sit to/from stand Additional ADL Goal #1: Pt will adhere to back safety during mobility and ADL with minimal verbal cues.  Visit Information  Last OT Received On: 10/11/13 Assistance Needed: +2 PT/OT/SLP Co-Evaluation/Treatment: Yes History of Present Illness: patient admitted s/p fall.  Patient with recent fall with L2 compression fx and h/o CKD.  Now with rhabdo.  w/p kyphoplasty 1/15    Subjective Data      Prior Functioning       Cognition  Cognition Arousal/Alertness: Awake/alert Behavior During Therapy: WFL for tasks assessed/performed Safety/Judgement: Decreased awareness of safety General Comments: Daughter does not seem to be happy with care she is getting.  Family called for assist.  No assist available in 1 minute so she went to desk to get assist.  Explained that sometimes nursing may be aminute bc they are in a different room.  Pt asked to talk to [name] [name] located and brought to room  to to talk to daughter.    Mobility  Bed Mobility Overal bed mobility:  (pt in chair on arrival.) Transfers Overall transfer level: Needs assistance Equipment used: Rolling walker (2  wheeled) Transfers: Sit to/from Stand Sit to Stand: Max assist General transfer comment: Pt very weak.  Did stand with +1 assist.  If were were to do pivot transfer, +2 would be necessary.    Exercises      Balance Balance Overall balance assessment: Needs assistance Sitting-balance support: Feet supported Sitting balance-Leahy Scale: Fair Standing balance support: Bilateral upper extremity supported;During functional activity Standing balance-Leahy Scale: Poor Standing balance comment: requires cues to straighten knees adn keep knees from buckling.  End of Session OT - End of Session Equipment Utilized During Treatment: Rolling walker Activity Tolerance: Patient limited by fatigue Patient left: in chair;with call bell/phone within reach;with family/visitor present Nurse Communication: Mobility status  GO     Glenford Peers 10/11/2013, 11:46 AM [phone]

## 2013-10-11 NOTE — Progress Notes (Signed)
Shift event: Shortly after 1900 hrs, NP was paged by dayshift RN. RN stated pt was c/o being SOB and RR was 28. Pt with normal O2 sats on 1.5L [place]. RN says pt's daughter keeps putting the pt's HOB down. NP ordered stat ABG, CXR, asked RN to placed a 100% NRB and NP to bedside. HPI: Brief-see progress notes for details. Pt here [place] pulmonary [place] [address] on admission. Has CKD stage 4-5. Creat 2.62. Also, what thought to be aspiration pneumonitis. Is being agressively diuresed and has been seen by nephrology as well. Is to have EGD with ? esophageal dilatation in am secondary to dysphagia. Is presently NPO for tests. SLP saw and said Dysphagia I diet with thin liquids and meds with applesauce was appropriate.  S: pt says she just got SOB more than usual. Was working to move herself up in the bed per daughter instruction. Says she feels nervous sometimes. Feels better with NRB on. No chest pain.  O: chronically ill appearing elderly WF in mild respiratory distress. Doesn't appear toxic. Alert, oriented and appropriate. Appears anxious. Her BP is 150s. RR is 24 with minor use of accessory muscles. O2 sat 98% on NRB. HR slightly tachy, regular. S1S2, RRR. Lungs with crackles at bases, good air exchange.  A/P: 1. Acute respiratory distress-ABG looks like hyperventilation. Pt changed back to [place] at 2L and will follow. Asked daughter and pt to leave [facility] elevated. NPO until after procedure tomorrow. Hold po meds tonight. Lasix 60mg  IV given x 1 now and another dose ordered to 4am after seeing CXR showing pulmonary edema. Has been on 160mg  Lasix po bid. Nephro note says "continue 160mg  po tid", so will give extra doses tonight and address appropriate continued doses tomorrow.  2. HTN-BP up today likely secondary to CHF, pulmonary edema. Better after Lasix. Can use Hydralazine IV prn as needed and cont Lasix IV tonight until po status returns.  3. Aspiration risks/dysphagia-spoke to pt and daughter about  NPO status-no pills, food or drink until approved after procedure tomorrow.  Pt is DNR. Will cont to follow resp status tonight.  Clance Boll, NP [facility]

## 2013-10-11 NOTE — Progress Notes (Signed)
Patient's daughter brought the patient biscuit and gravy from [employer] for the patient to eat. It was explained and encouraged not to bring outside foods due to her being fluid volume overloaded. Daughter stated that she will be bringing her mother whatever she wants to eat because she is not a heart failure patient, she is 80 years old, and she probably won't eat much of it. She also then proceeded with informing me that the doctor told her that she could bring her mother whatever she wanted from the outside. I told her that I wanted to let her know about the sausage in comparison to fluid retention, but ok thank you.

## 2013-10-11 NOTE — Progress Notes (Signed)
[facility] PROGRESS NOTE  [name] [name] [id number] DOB: 09-11-1926 DOA: 10/01/2013  PCP: Ann Held, MD  Brief HPI: [name] [name] is an 80 y.o. female with history of DM, HTN, CKD [stage 3-4], who sustained a fall 10/01/13 and laid on the floor for 7-10 hours. She was admitted on 10/02/13 via [facility] with acute on chronic renal failure due to rhabdomyolysis as well as severe hypoglycemia. Patient with recent L2 compression fracture and had been started on dilaudid and valium for outpatient pain management. She was treated with IV fluid with improvement. IR consulted performed Kyphoplasty 1/15. Unfortunately patient Aspirated on 10/07/13 and although she is a DNR, was resuscitatd within the scope of her care and transferred to the SDU for IV antibiotics. Her daughter [name] has on numerous occasions during this Hospital stay expressed her dissatisfaction at our care for her mother and has been verbally disrespectful to nursing staff and MD's. Son appears to be reasonable.  Past medical history:  Past Medical History  Diagnosis Date  . Diabetes mellitus     mellitus? x25 years  . HTN (hypertension)     x2 years  . Hypercholesterolemia     x 2 years  . Renal insufficiency     mild to moderate  . Anemia     chronic anemia due to chronic disease  . CAD (coronary artery disease)     nonobstructive   . GERD (gastroesophageal reflux disease)     Consultants: IR  Procedures:  Kyphoplasty 1/15  2D ECHO 1/15 Study Conclusions - Left ventricle: The cavity size was normal. There was moderate concentric hypertrophy. Systolic function was vigorous. The estimated ejection fraction was in the range of 65% to 70%. There was dynamic obstruction. There was dynamic obstruction, with a peak velocity of 357cm/sec and a peak gradient of 17mm Hg. Wall motion was normal; there were no regional wall motion abnormalities. Doppler parameters are consistent with abnormal left ventricular relaxation  (grade 1 diastolic dysfunction). Doppler parameters are consistent with high ventricular filling pressure. - Mitral valve: Moderately calcified annulus. The findings are consistent with mild stenosis. Mean gradient: 18mm Hg (D). - Left atrium: The atrium was mildly dilated. - Pulmonary arteries: Systolic pressure was mildly to moderately increased. PA peak pressure: 34mm Hg (S).  Antibiotics: Vanc 1/16-->1/19 Zosyn 1/16-->1/19 Augmentin 1/19-->  Subjective: Patient still coughing a lot with even liquids. More difficulty swallowing solids. More short of breath today. Denies chest pain. Back pain is better as well.   Objective: Vital Signs  Filed Vitals:   10/10/13 1304 10/10/13 2038 10/11/13 0509 10/11/13 0628  BP: 181/50 165/43 158/68 165/54  Pulse: 77 73  77  Temp: 98 F (36.7 C) 98 F (36.7 C)  97.9 F (36.6 C)  TempSrc: Oral Oral  Oral  Resp: 18 18  18   Height:      Weight:    91.763 kg (202 lb 4.8 oz)  SpO2: 99% 98%  98%    Intake/Output Summary (Last 24 hours) at 10/11/13 0848 Last data filed at 10/11/13 0500  Gross per 24 hour  Intake   1320 ml  Output    600 ml  Net    720 ml   Filed Weights   10/09/13 1300 10/10/13 0540 10/11/13 0628  Weight: 93.9 kg (207 lb 0.2 oz) 92.7 kg (204 lb 5.9 oz) 91.763 kg (202 lb 4.8 oz)    General appearance: alert, cooperative, appears stated age and no distress Resp: crackles bilaterally at bases without  wheezing. Cardio: regular rate and rhythm, S1, S2 normal.  Systolic murmur over precordium. no click, rub or gallop GI: soft, non-tender; bowel sounds normal; no masses,  no organomegaly Extremities: 1+ edema noted.  Pulses: 2+ and symmetric Neurologic: Alert and oriented x 3. No focal deficits.  Lab Results:  Basic Metabolic Panel:  Recent Labs Lab 10/04/13 1350  10/05/13 0400  10/07/13 0410 10/08/13 0540 10/09/13 0250 10/10/13 0350 10/11/13 0358  NA 140  --  144  < > 144 144 145 144 145  K 6.1*  < > 4.8  < > 4.8  4.3 4.0 4.2 4.2  CL 109  --  111  < > 110 109 107 106 107  CO2 17*  --  19  < > 20 22 23 25 27   GLUCOSE 219*  --  113*  < > 172* 207* 82 133* 129*  BUN 65*  --  61*  < > 54* 53* 50* 49* 45*  CREATININE 2.82*  --  2.90*  < > 2.64* 2.74* 2.87* 3.01* 2.86*  CALCIUM 7.7*  --  7.6*  < > 8.1* 7.9* 8.1* 8.3* 8.6  PHOS  --   --  4.3  --   --  3.9  --   --   --   < > = values in this interval not displayed. Liver Function Tests:  Recent Labs Lab 10/05/13 0400 10/08/13 0540  ALBUMIN 2.2* 2.4*   CBC:  Recent Labs Lab 10/07/13 0410 10/08/13 0540 10/09/13 0250 10/10/13 0350 10/11/13 0358  WBC 9.9 8.3 8.3 8.8 9.4  NEUTROABS  --  6.0  --   --   --   HGB 9.4* 8.9* 8.5* 9.4* 9.1*  HCT 31.4* 29.6* 27.9* 30.8* 29.7*  MCV 104.3* 103.1* 102.6* 102.3* 101.4*  PLT 272 260 251 247 253   Cardiac Enzymes:  Recent Labs Lab 10/06/13 1401 10/07/13 0410 10/07/13 1600 10/09/13 1134  CKTOTAL 212* 168  --   --   TROPONINI  --   --  0.36* 0.31*   BNP (last 3 results)  Recent Labs  10/09/13 1134  PROBNP 22117.0*   CBG:  Recent Labs Lab 10/10/13 0608 10/10/13 1110 10/10/13 1622 10/10/13 2141 10/11/13 0553  GLUCAP 148* 147* 116* 136* 133*    Recent Results (from the past 240 hour(s))  URINE CULTURE     Status: None   Collection Time    10/03/13 11:30 PM      Result Value Range Status   Specimen Description URINE, RANDOM   Final   Special Requests NONE   Final   Culture  Setup Time     Final   Value: 10/04/2013 01:10     Performed at [facility]     Final   Value: >=100,000 COLONIES/ML     Performed at [facility]   Culture     Final   Value: ESCHERICHIA COLI     Performed at [facility]   Report Status 10/06/2013 FINAL   Final   Organism ID, Bacteria ESCHERICHIA COLI   Final  MRSA PCR SCREENING     Status: Abnormal   Collection Time    10/07/13  3:08 PM      Result Value Range Status   MRSA by PCR POSITIVE (*) NEGATIVE Final    Comment:            The GeneXpert MRSA Assay (FDA     approved for NASAL specimens  only), is one component of a     comprehensive MRSA colonization     surveillance program. It is not     intended to diagnose MRSA     infection nor to guide or     monitor treatment for     MRSA infections.     RESULT CALLED TO, READ BACK BY AND VERIFIED WITH:     Rowland Lathe RN 17:00 10/07/13 ([email or web address])      Studies/Results: Dg Esophagus  10/10/2013   CLINICAL DATA:  Dysphagia  EXAM: ESOPHOGRAM/BARIUM SWALLOW  TECHNIQUE: Single contrast examination was performed using  thin barium.  COMPARISON:  None.  FLUOROSCOPY TIME:  49 seconds  FINDINGS: Moderate esophageal dysmotility with numerous non-peristaltic/tertiary contractions.  No definite fixed esophageal narrowing or stricture. However, a barium tablet was not administered since the patient could not stand upright.  Small hiatal hernia.  Gastroesophageal reflux could not be assessed due to the patient's inability to clear her esophagus.  IMPRESSION: Moderate esophageal dysmotility.  No definite fixed esophageal narrowing or stricture.  Small hiatal hernia.   Electronically Signed   By: Julian Hy M.D.   On: 10/10/2013 14:38    Medications:  Scheduled: . amitriptyline  10 mg Oral QHS  . amoxicillin-clavulanate  800 mg Oral Q12H  . aspirin EC  81 mg Oral Daily  . atorvastatin  20 mg Oral q1800  . diltiazem  30 mg Oral Q8H  . febuxostat  40 mg Oral QHS  . furosemide  60 mg Oral BID  . heparin  5,000 Units Subcutaneous Q8H  . insulin aspart  0-9 Units Subcutaneous TID WC  . insulin aspart  5 Units Subcutaneous TID WC  . insulin detemir  12 Units Subcutaneous Daily  . lanthanum  500 mg Oral TID WC  . metoprolol succinate  12.5 mg Oral Daily  . pantoprazole  40 mg Oral Q1200  . polyethylene glycol  17 g Oral Daily  . senna-docusate  1 tablet Oral BID  . sodium bicarbonate  650 mg Oral BID  . sucralfate  1 g Oral TID WC & HS  . traMADol   50 mg Oral Q4H  . vitamin B-12  1,000 mcg Oral Daily   Continuous:  [id number], HYDROmorphone (DILAUDID) injection, lip balm, ondansetron, phenol  Assessment/Plan:  Principal Problem:   Acute respiratory failure Active Problems:   CKD (chronic kidney disease) stage 4, GFR 15-29 ml/min   Rhabdomyolysis   Hypoglycemia   UTI (urinary tract infection)   Hyperkalemia   Hypertrophic obstructive cardiomyopathy(425.11)   Fluid overload   Dysphagia, unspecified(787.20)   Malnutrition of moderate degree    Acute Respiratory failure Likely Aspiration Pneumonitis along with fluid overload. Was diuresing well. Changed to oral Lasix.   Pulmonary Edema Changed to oral Lasix. Diuresing well. Elevated troponin likely due to demand ischemia. No further work up. ECHO has been done. Will repeat CXR.  Aspiration Pneumonitis Stable but still coughing and has choking sensation when she eats or drinks. Aspiration precautions. Change to Augmentin Susp.   Dysphagia SLP suspects esophageal etiology and history suggests the same. Barium swallow shows esophageal dysmotility. Seen by SLP and on Dysphagia diet. However oral and pharyngeal phases appeared normal. I discussed with Dr. Olevia Perches 1/19 and initiated patient on PPI and Carafate. May have to consult formally as patient appears to have significant symptoms. EGD could be tricky considering pulmonary status.  Rhabdomyolysis CK levels improved. Resolved. Was secondary to fall. Stopped IVF.  Diabetes Mellitus Type 2 with  episodes of Hypoglycemia Monitor CBG's closely. On Levemir and SSI. Check A1C. Was on 70/30 at home which should be stopped and changed to Levemir. Should be discharged on Levemir.  Pyelonephritis with E coli Had completed course of abx. Repeat UA was abnormal. Continue abx.  Constipation Continue miralax  Back Pain Status post kyphoplasty to L2 1/15. Seems to be better. Continue PT/OT.   CKD Stage 4 Creatinine is better  today. Continue to monitor urine output. Monitor creatinine.   History of Hypertrophic cardiomyopathy Stable for most part. ECHO showed normal EF with diastolic dysfunction. She did have a pause earlier this hospitalization. No recurrence seen. Will continue her beta blockers and continue to monitor.   Code Status: DNR  DVT Prophylaxis: Heparin    Family Communication: Discussed with her daughter 1/19  Disposition Plan: It appears she will need to go to SNF now and not CIR. Repeat PT eval is pending. CSW following. Await GI input. Not ready yet.     LOS: 10 days   [facility] Pager [phone] 10/11/2013, 8:48 AM  If 8PM-8AM, please contact night-coverage at [email or web address], password [facility]

## 2013-10-11 NOTE — Progress Notes (Signed)
Patient's daughter called out for assistance. Patient was assessed having increased work of breathing upon arrival to the patient's room. Daughter stated that her mother state's she can not take a deep breath. Respirations 28. Left the room to obtain a dinamap. Came back to the room and the daughter has the bed in trendelenburg position and making the patient pull her self up in bed, while at the same time you can hear the patient breathing harder. I asked the daughter to stop and that could cause her breathing to be worse. She spoke over me and told her mother to continue doing what she asked her to do. BP is 158/79; HR 74; O2-Sats 98% on 1.5L [place].  I paged and notified MD on call and made respiratory therapist aware. I raised the [place] and walked out the pt's room while speaking to RT, returned to the pt's room and HOB was flat. Raised HOB once again left out while still speaking to RT, returned and HOB was flat again. I explained to the daughter and patient that raising the [facility] will help promote better breathing and the daughter told the patient, "did you hear that, stop asking me to lower your bed." I raised her head of the bed back up, left pulse ox on patient, it is currently reading 98-99% on a non-rebreather mask. New orders given. RT and MD on their way to follow up and further assess the patient. Receiving RN aware.

## 2013-10-11 NOTE — Progress Notes (Signed)
Spoke with Tylene Fantasia, NP on call, need to hold all PO meds tonight d/t EGD. Pt cannot take pills without applesauce. Ronnette Hila, RN

## 2013-10-11 NOTE — Progress Notes (Signed)
Report given to receiving RN. Patient is currently on a non-rebreather mask getting a bedside xray. Daughter at bedside. No verbal complaints.

## 2013-10-11 NOTE — Progress Notes (Signed)
Speech Language Pathology Treatment: Dysphagia  Patient Details Name: [name] [name] MRN: [id number] DOB: [birth year] Today's Date: 10/11/2013 Time: 0037-0488 SLP Time Calculation (min): 29 min  Assessment / Plan / Recommendation Clinical Impression  Pt not taking much po per RN.  She is to have EGD with dilation tomorrow.  SLP provided very small boluses of warm tea via teaspoon initially, then via straw.   No overt s/s aspiration noted initially, but once straw used coughing noted.  PO trials were ceased at this time.  Increase in SOB noted toward the end of po trials. RN notified.  ST to recheck diet tolerance after dilation.     HPI HPI: [name] [name] is an 80 y.o. female with history of DM, HTN, CKD stage 3-4, who sustained a fall 10/01/13 and laid on the floor for 7-10 hours. She was admitted on 10/02/13 via [facility] with acute on chronic renal failure due to rhabdomyolysis as well as severe hypoglycemia.     Pertinent Vitals VSS  SLP Plan  Continue with current plan of care    Recommendations Diet recommendations: Dysphagia 1 (puree);Thin liquid Liquids provided via: Cup;Straw Medication Administration: Crushed with puree Supervision: Patient able to self feed;Staff to assist with self feeding;Full supervision/cueing for compensatory strategies Compensations: Slow rate;Small sips/bites;Multiple dry swallows after each bite/sip;Follow solids with liquid (begin meal with warm beverage) Postural Changes and/or Swallow Maneuvers: Seated upright 90 degrees;Upright 30-60 min after meal              Oral Care Recommendations: Oral care Q4 per protocol Follow up Recommendations: Inpatient Rehab Plan: Continue with current plan of care    [place]mm[place] B. Quentin Ore [facility], CCC-SLP [phone]   Shonna Chock 10/11/2013, 3:39 PM

## 2013-10-11 NOTE — Progress Notes (Signed)
I met with pt at bedside and then contacted her daughter by phone. I discussed inpt rehab venue expectations of 3 hrs per day of therapy with a LOS of less than 2 weeks. Patient currently not at a level to tolerate intense inpt rehab. I will follow her progress. [phone]

## 2013-10-12 ENCOUNTER — Encounter (HOSPITAL_COMMUNITY): Payer: Self-pay | Admitting: *Deleted

## 2013-10-12 ENCOUNTER — Encounter (HOSPITAL_COMMUNITY)
Admission: RE | Disposition: A | Discharge status: Skilled Nursing Facility | Payer: Self-pay | Source: Other Acute Inpatient Hospital | Attending: Family Medicine

## 2013-10-12 HISTORY — PX: SAVORY DILATION: SHX5439

## 2013-10-12 HISTORY — PX: ESOPHAGOGASTRODUODENOSCOPY (EGD) WITH ESOPHAGEAL DILATION: SHX5812

## 2013-10-12 LAB — RENAL FUNCTION PANEL
Albumin: 2.6 g/dL — ABNORMAL LOW (ref 3.5–5.2)
BUN: 42 mg/dL — ABNORMAL HIGH (ref 6–23)
CHLORIDE: 107 meq/L (ref 96–112)
CO2: 26 meq/L (ref 19–32)
Calcium: 8.9 mg/dL (ref 8.4–10.5)
Creatinine, Ser: 2.64 mg/dL — ABNORMAL HIGH (ref 0.50–1.10)
GFR, EST AFRICAN AMERICAN: 18 mL/min — AB (ref >90)
GFR, EST NON AFRICAN AMERICAN: 15 mL/min — AB (ref >90)
Glucose, Bld: 110 mg/dL — ABNORMAL HIGH (ref 70–99)
POTASSIUM: 3.7 meq/L (ref 3.7–5.3)
Phosphorus: 3.3 mg/dL (ref 2.3–4.6)
SODIUM: 148 meq/L — AB (ref 137–147)

## 2013-10-12 LAB — CBC
HCT: 30.3 % — ABNORMAL LOW (ref 36.0–46.0)
Hemoglobin: 9.4 g/dL — ABNORMAL LOW (ref 12.0–15.0)
MCH: 31.2 pg (ref 26.0–34.0)
MCHC: 31 g/dL (ref 30.0–36.0)
MCV: 100.7 fL — ABNORMAL HIGH (ref 78.0–100.0)
PLATELETS: 278 10*3/uL (ref 150–400)
RBC: 3.01 MIL/uL — AB (ref 3.87–5.11)
RDW: 15.9 % — AB (ref 11.5–15.5)
WBC: 10.4 10*3/uL (ref 4.0–10.5)

## 2013-10-12 LAB — FOLATE: Folate: 8.1 ng/mL

## 2013-10-12 LAB — GLUCOSE, CAPILLARY
GLUCOSE-CAPILLARY: 173 mg/dL — AB (ref 70–99)
GLUCOSE-CAPILLARY: 253 mg/dL — AB (ref 70–99)
Glucose-Capillary: 129 mg/dL — ABNORMAL HIGH (ref 70–99)
Glucose-Capillary: 102 mg/dL — ABNORMAL HIGH (ref 70–99)
Glucose-Capillary: 124 mg/dL — ABNORMAL HIGH (ref 70–99)
Glucose-Capillary: 145 mg/dL — ABNORMAL HIGH (ref 70–99)

## 2013-10-12 LAB — PARATHYROID HORMONE, INTACT (NO CA): PTH: 528.9 pg/mL — ABNORMAL HIGH (ref 14.0–72.0)

## 2013-10-12 LAB — HEMOGLOBIN A1C
HEMOGLOBIN A1C: 6.4 % — AB (ref <5.7)
MEAN PLASMA GLUCOSE: 137 mg/dL — AB (ref <117)

## 2013-10-12 LAB — TROPONIN I: Troponin I: 0.38 ng/mL (ref <0.30)

## 2013-10-12 LAB — VITAMIN B12: Vitamin B-12: >2000 pg/mL — ABNORMAL HIGH (ref 211–911)

## 2013-10-12 LAB — PRO B NATRIURETIC PEPTIDE: Pro B Natriuretic peptide (BNP): 31197 pg/mL — ABNORMAL HIGH (ref 0–450)

## 2013-10-12 SURGERY — ESOPHAGOGASTRODUODENOSCOPY (EGD) WITH ESOPHAGEAL DILATION
Anesthesia: Moderate Sedation

## 2013-10-12 MED ORDER — BUTAMBEN-TETRACAINE-BENZOCAINE 2-2-14 % EX AERO
INHALATION_SPRAY | CUTANEOUS | Status: DC | PRN
  Administered 2013-10-12: 1 via TOPICAL

## 2013-10-12 MED ORDER — FENTANYL CITRATE 0.05 MG/ML IJ SOLN
INTRAMUSCULAR | Status: DC | PRN
  Administered 2013-10-12: 25 ug via INTRAVENOUS

## 2013-10-12 MED ORDER — INSULIN ASPART 100 UNIT/ML ~~LOC~~ SOLN
0.0000 [IU] | Freq: Three times a day (TID) | SUBCUTANEOUS | Status: DC
  Administered 2013-10-13: 7 [IU] via SUBCUTANEOUS
  Administered 2013-10-13: 3 [IU] via SUBCUTANEOUS

## 2013-10-12 MED ORDER — MIDAZOLAM HCL 5 MG/ML IJ SOLN
INTRAMUSCULAR | Status: AC
  Filled 2013-10-12: qty 1

## 2013-10-12 MED ORDER — FERUMOXYTOL INJECTION 510 MG/17 ML
510.0000 mg | Freq: Once | INTRAVENOUS | Status: AC
  Administered 2013-10-12: 510 mg via INTRAVENOUS
  Filled 2013-10-12: qty 17

## 2013-10-12 MED ORDER — HYDRALAZINE HCL 20 MG/ML IJ SOLN
5.0000 mg | Freq: Four times a day (QID) | INTRAMUSCULAR | Status: DC | PRN
Start: 2013-10-12 — End: 2013-10-14
  Filled 2013-10-12: qty 1

## 2013-10-12 MED ORDER — DARBEPOETIN ALFA-POLYSORBATE 60 MCG/0.3ML IJ SOLN
60.0000 ug | Freq: Once | INTRAMUSCULAR | Status: AC
  Administered 2013-10-12: 60 ug via SUBCUTANEOUS
  Filled 2013-10-12: qty 0.3

## 2013-10-12 MED ORDER — MIDAZOLAM HCL 10 MG/2ML IJ SOLN
INTRAMUSCULAR | Status: DC | PRN
  Administered 2013-10-12 (×2): 1 mg via INTRAVENOUS

## 2013-10-12 MED ORDER — FENTANYL CITRATE 0.05 MG/ML IJ SOLN
INTRAMUSCULAR | Status: AC
  Filled 2013-10-12: qty 2

## 2013-10-12 NOTE — Progress Notes (Addendum)
[facility] PROGRESS NOTE  [name] [name] ASN:[id number] DOB: Feb 24, 1926 DOA: 10/01/2013 PCP: Ann Held, MD   Brief narrative 80 y.o. female with history of DM, HTN, CKD [stage 3-4], who sustained a fall 10/01/13 and laid on the floor for 7-10 hours. She was admitted on 10/02/13 via [facility] with acute on chronic renal failure due to rhabdomyolysis as well as severe hypoglycemia. Patient with recent L2 compression fracture and had been started on dilaudid and valium for outpatient pain management. She was treated with IV fluid with improvement. IR consulted and performed Kyphoplasty 1/15. Unfortunately patient Aspirated on 10/07/13 and although she is a DNR, was resuscitatd within the scope of her care and transferred to the SDU for IV antibiotics. Her daughter [name] [name] has on numerous occasions during this Hospital stay expressed her dissatisfaction at our care for her mother and has been verbally disrespectful to nursing staff and MD's. Son appears to be reasonable.  Assessment/Plan:  Acute Respiratory failure  Likely Aspiration Pneumonitis along with fluid overload.  diuresing well. Changed to oral Lasix.   Pulmonary Edema  Developed acute pulm edema overnight requiring extra dose of IV lasix. Now stable on [place]. continue high dose oral lasix. . Elevated troponin likely due to demand ischemia. No further work up. ECHO completed. . Aspiration Pneumonitis  Stable but still coughing and has choking sensation when she eats or drinks. Aspiration precautions. Change to Augmentin Susp.    Dysphagia  SLP suspects esophageal etiology and history suggests the same. Barium swallow shows esophageal dysmotility. Seen by SLP and on Dysphagia diet. However oral and pharyngeal phases appeared normal. After discussing with GI she was initiated patient on PPI and Carafate.  GI plan on EGD today.   Rhabdomyolysis   Resolved. secondary to fall. Stopped IVF.   Diabetes Mellitus Type 2 with episodes of  Hypoglycemia  Monitor CBG's closely. On Levemir and SSI. Check A1C. Was on 70/30 at home which wil be switched to levemir on discharge.  Pyelonephritis with E coli  Had completed course of abx. Repeat UA was abnormal. Continue abx.    Constipation  Continue miralax   Back Pain  Status post kyphoplasty to L2 1/15. Seems to be better. Continue PT/OT.   CKD Stage 4  Creatinine unchanged .Marland Kitchen Continue to monitor urine output.   History of Hypertrophic cardiomyopathy  ECHO showed high EF of 19% with diastolic dysfunction. She did have a pause earlier this hospitalization. No recurrence seen. Will continue her beta blocker and cardizem and continue to monitor.   Code Status: DNR   DVT Prophylaxis: sq Heparin  Family Communication: none at bedside Disposition Plan: EGD today. CIR evaluating. Possibly needs SNF      HPI/Subjective: Patient seen and examined this morning. Overnight became hypoxic and tachypneic and placed on nonrebreather. ABG done showed mild hypercapnia. Given 60 mg IV Lasix to which symptoms improve and placed back on nasal cannula . Denies worsening shortness of breath or chest discomfort this morning..  Objective: Filed Vitals:   10/12/13 0407  BP: 161/74  Pulse: 77  Temp: 98.1 F (36.7 C)  Resp: 21    Intake/Output Summary (Last 24 hours) at 10/12/13 1028 Last data filed at 10/12/13 0843  Gross per 24 hour  Intake    170 ml  Output   2275 ml  Net  -2105 ml   Filed Weights   10/10/13 0540 10/11/13 0628 10/12/13 0407  Weight: 92.7 kg (204 lb 5.9 oz) 91.763 kg (202 lb 4.8 oz) 91.2  kg (201 lb 1 oz)    Exam:   General:  Elderly female lying in bed in no acute distress  HEENT: No pallor, moist oral mucosa  Chest: By basilar crackles, no rhonchi or wheezing  CVS: Normal S1-S2, no murmurs rub or gallop  Abdomen: Soft, nontender, nondistended, bowel sounds present, Foley in place  Extremities: Warm, 2+ pitting edema bilaterally  CNS: AAO  x3  Data Reviewed: Basic Metabolic Panel:  Recent Labs Lab 10/08/13 0540 10/09/13 0250 10/10/13 0350 10/11/13 0358 10/11/13 1800 10/12/13 0610  NA 144 145 144 145 145 148*  K 4.3 4.0 4.2 4.2 4.0 3.7  CL 109 107 106 107 104 107  CO2 22 23 25 27 25 26   GLUCOSE 207* 82 133* 129* 215* 110*  BUN 53* 50* 49* 45* 43* 42*  CREATININE 2.74* 2.87* 3.01* 2.86* 2.62* 2.64*  CALCIUM 7.9* 8.1* 8.3* 8.6 8.7 8.9  PHOS 3.9  --   --   --  2.6 3.3   Liver Function Tests:  Recent Labs Lab 10/08/13 0540 10/11/13 1800 10/12/13 0610  ALBUMIN 2.4* 2.7* 2.6*   No results found for this basename: LIPASE, AMYLASE,  in the last 168 hours No results found for this basename: AMMONIA,  in the last 168 hours CBC:  Recent Labs Lab 10/08/13 0540 10/09/13 0250 10/10/13 0350 10/11/13 0358 10/12/13 0611  WBC 8.3 8.3 8.8 9.4 10.4  NEUTROABS 6.0  --   --   --   --   HGB 8.9* 8.5* 9.4* 9.1* 9.4*  HCT 29.6* 27.9* 30.8* 29.7* 30.3*  MCV 103.1* 102.6* 102.3* 101.4* 100.7*  PLT 260 251 247 253 278   Cardiac Enzymes:  Recent Labs Lab 10/06/13 1401 10/07/13 0410 10/07/13 1600 10/09/13 1134 10/12/13 0830  CKTOTAL 212* 168  --   --   --   TROPONINI  --   --  0.36* 0.31* 0.38*   BNP (last 3 results)  Recent Labs  10/09/13 1134 10/12/13 0611  PROBNP 22117.0* 31197.0*   CBG:  Recent Labs Lab 10/11/13 2043 10/11/13 2245 10/11/13 2349 10/12/13 0406 10/12/13 0734  GLUCAP 189* 122* 129* 102* 124*    Recent Results (from the past 240 hour(s))  URINE CULTURE     Status: None   Collection Time    10/03/13 11:30 PM      Result Value Range Status   Specimen Description URINE, RANDOM   Final   Special Requests NONE   Final   Culture  Setup Time     Final   Value: 10/04/2013 01:10     Performed at [facility]     Final   Value: >=100,000 COLONIES/ML     Performed at [facility]   Culture     Final   Value: ESCHERICHIA COLI     Performed at [facility]   Report Status 10/06/2013 FINAL   Final   Organism ID, Bacteria ESCHERICHIA COLI   Final  MRSA PCR SCREENING     Status: Abnormal   Collection Time    10/07/13  3:08 PM      Result Value Range Status   MRSA by PCR POSITIVE (*) NEGATIVE Final   Comment:            The GeneXpert MRSA Assay (FDA     approved for NASAL specimens     only), is one component of a     comprehensive MRSA colonization  surveillance program. It is not     intended to diagnose MRSA     infection nor to guide or     monitor treatment for     MRSA infections.     RESULT CALLED TO, READ BACK BY AND VERIFIED WITH:     E. STANFIELD RN 17:00 10/07/13 ([email or web address])  CLOSTRIDIUM DIFFICILE BY PCR     Status: None   Collection Time    10/11/13  3:23 PM      Result Value Range Status   C difficile by pcr NEGATIVE  NEGATIVE Final     Studies: Dg Esophagus  10/10/2013   CLINICAL DATA:  Dysphagia  EXAM: ESOPHOGRAM/BARIUM SWALLOW  TECHNIQUE: Single contrast examination was performed using  thin barium.  COMPARISON:  None.  FLUOROSCOPY TIME:  49 seconds  FINDINGS: Moderate esophageal dysmotility with numerous non-peristaltic/tertiary contractions.  No definite fixed esophageal narrowing or stricture. However, a barium tablet was not administered since the patient could not stand upright.  Small hiatal hernia.  Gastroesophageal reflux could not be assessed due to the patient's inability to clear her esophagus.  IMPRESSION: Moderate esophageal dysmotility.  No definite fixed esophageal narrowing or stricture.  Small hiatal hernia.   Electronically Signed   By: Julian Hy M.D.   On: 10/10/2013 14:38   US Renal  10/11/2013   CLINICAL DATA:  Chronic kidney disease  EXAM: RENAL/URINARY TRACT ULTRASOUND COMPLETE  COMPARISON:  US RENAL dated 06/07/2012; US RENAL dated 02/17/2012  FINDINGS: Right Kidney: 9.9 cm. Increased echogenicity and decreased cortical thickness. No hydronephrosis.  Left Kidney: 9.3 cm. Increased  echogenicity and decreased cortical thickness. No hydronephrosis.  Bladder:  Collapsed around a Foley catheter.  IMPRESSION: 1.  No hydronephrosis. 2. Findings of medical renal disease, including mild cortical thinning and increased echogenicity.   Electronically Signed   By: Abigail Miyamoto M.D.   On: 10/11/2013 19:32   Dg Chest Port 1 View  10/11/2013   CLINICAL DATA:  Respiratory distress and question fluid overload.  EXAM: PORTABLE CHEST - 1 VIEW  COMPARISON:  10/11/2013  FINDINGS: Again noted are diffuse interstitial lung densities suggesting edema. Heart size appears to be enlarged. Minimal change from the previous examination. Bibasilar densities are also concerning for pleural effusions.  IMPRESSION: Findings are most compatible with pulmonary edema and pleural effusions.  Heart size is prominent for size.   Electronically Signed   By: Markus Daft M.D.   On: 10/11/2013 20:02   Dg Chest Port 1 View  10/11/2013   CLINICAL DATA:  Short of breath.  Difficulty swelling.  EXAM: PORTABLE CHEST - 1 VIEW  COMPARISON:  10/08/2013 and 10/01/2013  FINDINGS: There are irregularly thickened interstitial markings bilaterally suggesting interstitial edema superimposed on chronic interstitial thickening. There are no areas of focal lung consolidation. No pleural effusion or pneumothorax is seen.  Cardiac silhouette is top-normal in size. No convincing mediastinal or hilar masses.  IMPRESSION: Irregular interstitial thickening increased from the prior exam suggesting interstitial edema superimposed on chronic interstitial thickening.   Electronically Signed   By: Lajean Manes M.D.   On: 10/11/2013 09:38    Scheduled Meds: . amitriptyline  10 mg Oral QHS  . amLODipine  10 mg Oral Daily  . amoxicillin-clavulanate  500 mg Oral Q12H  . aspirin EC  81 mg Oral Daily  . atorvastatin  20 mg Oral q1800  . darbepoetin (ARANESP) injection - NON-DIALYSIS  60 mcg Subcutaneous ONCE-1800  . diltiazem  30 mg Oral Q8H  .  febuxostat  40 mg Oral QHS  . ferumoxytol  510 mg Intravenous Once  . furosemide  160 mg Oral BID  . insulin aspart  0-9 Units Subcutaneous Q4H  . lanthanum  500 mg Oral TID WC  . metoprolol succinate  12.5 mg Oral Daily  . pantoprazole  40 mg Oral Q1200  . sodium bicarbonate  650 mg Oral BID  . traMADol  50 mg Oral Q4H  . vitamin B-12  1,000 mcg Oral Daily   Continuous Infusions: . sodium chloride        Time spent: 35 minutes    Ennifer Harston  [facility] Pager [phone] If 7PM-7AM, please contact night-coverage at [email or web address], password [facility] 10/12/2013, 10:28 AM  LOS: 11 days

## 2013-10-12 NOTE — Progress Notes (Signed)
Additional 25 mcg Fentanly given at 1550 with a total of 50 mcg during th procedure.

## 2013-10-12 NOTE — Progress Notes (Signed)
Ok to not administer sodium infusion prior to procedure per PA of gastroenterology. PA would like for order to remain on work list for procedural IV push medications.

## 2013-10-12 NOTE — Op Note (Signed)
[facility], [id number]   ENDOSCOPY PROCEDURE REPORT  PATIENT: [name], [name]  MR#: [id number] BIRTHDATE: [birth year] , 86  yrs. old GENDER: Female ENDOSCOPIST: Lafayette Dragon, MD REFERRED BY:  Dr Maryland Pink PROCEDURE DATE:  10/12/2013 PROCEDURE:  EGD, diagnostic and Savary dilation of esophagus ASA CLASS:     Class III INDICATIONS:  Dysphagia. MEDICATIONS: These medications were titrated to patient response per physician's verbal order, Fentanyl 50 mcg IV, and Versed 2 mg IV TOPICAL ANESTHETIC: Cetacaine Spray  DESCRIPTION OF PROCEDURE: After the risks benefits and alternatives of the procedure were thoroughly explained, informed consent was obtained.  The Pentax Gastroscope [id number] endoscope was introduced through the mouth and advanced to the second portion of the duodenum. Without limitations.  The instrument was slowly withdrawn as the mucosa was fully examined.        ESOPHAGUS: The mucosa of the esophagus appeared normal. the lumen was somewhat spastic but was normal size and there was no retained food. Upper esophageal sphincter was spastic but relaxed with pressure on the scope. Lower esophageal sphincter was also spastic but relaxed with pressure of the scope there was no hiatal hernia or esophagitis Stomach: Gastric mucosa appeared normal with the exception of prepyloric antrum which showed mild prepyloric erythema retroflexion of the endoscope revealed normal fundus and cardia  Duodenum duodenal bulb and descending duodenum was normal the guidewire was passed into the stomach and Savary dilators passed over the guidewire using 16 and 17 mm dilators. There was no blood on the dilator. There was mild resistance with the passage of the scope Retroflexed views revealed no abnormalities.     The scope was then withdrawn from the patient and the procedure completed.  COMPLICATIONS: There were no  complications. ENDOSCOPIC IMPRESSION: The mucosa of the esophagus appeared normal ,upper and lower esophageal sphincter spasm. No evidence of stricture. Status post passage of 16 and 17 mm Savary dilators  RECOMMENDATIONS: antireflux measures advance diet  REPEAT EXAM: for No recall due to age.Marland Kitchen  eSigned:  Lafayette Dragon, MD 10/12/2013 4:21 PM   CC:  PATIENT NAME:  [name], [name] MR#: [id number]

## 2013-10-12 NOTE — Progress Notes (Signed)
[facility] Progress Note   Subjective:   Pt breathing slightly improved since yesterday.  Denies any worsening lower extremity edema    Objective:   BP 161/74  Pulse 77  Temp(Src) 98.1 F (36.7 C) (Oral)  Resp 21  Ht $R'5\' 6"'lH$  (1.676 m)  Wt 201 lb 1 oz (91.2 kg)  BMI 32.47 kg/m2  SpO2 100%  Intake/Output Summary (Last 24 hours) at 10/12/13 0741 Last data filed at 10/12/13 0600  Gross per 24 hour  Intake    410 ml  Output   2050 ml  Net  -1640 ml   Weight change: -1 lb 3.9 oz (-0.563 kg)  Physical Exam: Gen: NAD today   HEENT: [place]/AT, MMM  Neck: No JVD evident  Cardio: RRR, 2/6 SEM holosystolic RUSB  Lungs: increased WOB, Rales Bibasilar improving  Abdomen: Soft, NT/ND, NABS, no CVA tenderness  Extremities: +1 pedal edema B/L  Neuro: AAO x 3, no focal deficits, no asterixis   Imaging: Dg Esophagus  10/10/2013   CLINICAL DATA:  Dysphagia  EXAM: ESOPHOGRAM/BARIUM SWALLOW  TECHNIQUE: Single contrast examination was performed using  thin barium.  COMPARISON:  None.  FLUOROSCOPY TIME:  49 seconds  FINDINGS: Moderate esophageal dysmotility with numerous non-peristaltic/tertiary contractions.  No definite fixed esophageal narrowing or stricture. However, a barium tablet was not administered since the patient could not stand upright.  Small hiatal hernia.  Gastroesophageal reflux could not be assessed due to the patient's inability to clear her esophagus.  IMPRESSION: Moderate esophageal dysmotility.  No definite fixed esophageal narrowing or stricture.  Small hiatal hernia.   Electronically Signed   By: Julian Hy M.D.   On: 10/10/2013 14:38   US Renal  10/11/2013   CLINICAL DATA:  Chronic kidney disease  EXAM: RENAL/URINARY TRACT ULTRASOUND COMPLETE  COMPARISON:  US RENAL dated 06/07/2012; US RENAL dated 02/17/2012  FINDINGS: Right Kidney: 9.9 cm. Increased echogenicity and decreased cortical thickness. No hydronephrosis.  Left Kidney: 9.3 cm. Increased echogenicity  and decreased cortical thickness. No hydronephrosis.  Bladder:  Collapsed around a Foley catheter.  IMPRESSION: 1.  No hydronephrosis. 2. Findings of medical renal disease, including mild cortical thinning and increased echogenicity.   Electronically Signed   By: Abigail Miyamoto M.D.   On: 10/11/2013 19:32   Dg Chest Port 1 View  10/11/2013   CLINICAL DATA:  Respiratory distress and question fluid overload.  EXAM: PORTABLE CHEST - 1 VIEW  COMPARISON:  10/11/2013  FINDINGS: Again noted are diffuse interstitial lung densities suggesting edema. Heart size appears to be enlarged. Minimal change from the previous examination. Bibasilar densities are also concerning for pleural effusions.  IMPRESSION: Findings are most compatible with pulmonary edema and pleural effusions.  Heart size is prominent for size.   Electronically Signed   By: Markus Daft M.D.   On: 10/11/2013 20:02   Dg Chest Port 1 View  10/11/2013   CLINICAL DATA:  Short of breath.  Difficulty swelling.  EXAM: PORTABLE CHEST - 1 VIEW  COMPARISON:  10/08/2013 and 10/01/2013  FINDINGS: There are irregularly thickened interstitial markings bilaterally suggesting interstitial edema superimposed on chronic interstitial thickening. There are no areas of focal lung consolidation. No pleural effusion or pneumothorax is seen.  Cardiac silhouette is top-normal in size. No convincing mediastinal or hilar masses.  IMPRESSION: Irregular interstitial thickening increased from the prior exam suggesting interstitial edema superimposed on chronic interstitial thickening.   Electronically Signed   By: Lajean Manes M.D.   On: 10/11/2013 09:38  Labs: BMET  Recent Labs Lab 10/06/13 0356 10/07/13 0410 10/08/13 0540 10/09/13 0250 10/10/13 0350 10/11/13 0358 10/11/13 1800  NA 144 144 144 145 144 145 145  K 4.6 4.8 4.3 4.0 4.2 4.2 4.0  CL 110 110 109 107 106 107 104  CO2 $Re'20 20 22 23 25 27 25  'WPn$ GLUCOSE 71 172* 207* 82 133* 129* 215*  BUN 57* 54* 53* 50* 49* 45*  43*  CREATININE 2.79* 2.64* 2.74* 2.87* 3.01* 2.86* 2.62*  CALCIUM 7.7* 8.1* 7.9* 8.1* 8.3* 8.6 8.7  PHOS  --   --  3.9  --   --   --  2.6   CBC  Recent Labs Lab 10/08/13 0540 10/09/13 0250 10/10/13 0350 10/11/13 0358 10/12/13 0611  WBC 8.3 8.3 8.8 9.4 10.4  NEUTROABS 6.0  --   --   --   --   HGB 8.9* 8.5* 9.4* 9.1* 9.4*  HCT 29.6* 27.9* 30.8* 29.7* 30.3*  MCV 103.1* 102.6* 102.3* 101.4* 100.7*  PLT 260 251 247 253 278    Medications:    . amitriptyline  10 mg Oral QHS  . amLODipine  10 mg Oral Daily  . amoxicillin-clavulanate  500 mg Oral Q12H  . aspirin EC  81 mg Oral Daily  . atorvastatin  20 mg Oral q1800  . diltiazem  30 mg Oral Q8H  . febuxostat  40 mg Oral QHS  . furosemide  160 mg Oral BID  . insulin aspart  0-9 Units Subcutaneous Q4H  . lanthanum  500 mg Oral TID WC  . metoprolol succinate  12.5 mg Oral Daily  . pantoprazole  40 mg Oral Q1200  . sodium bicarbonate  650 mg Oral BID  . traMADol  50 mg Oral Q4H  . vitamin B-12  1,000 mcg Oral Daily      Assessment/ Plan:   [name] [name] is an 80 y.o. female with history of DM, HTN, CKD stage 4, who sustained a fall 10/01/13 and laid on the floor for about 8 hrs.   1) CKD stage IV - Pt may be at baseline, no renal damage secondary to rhabdo.  AVF in R arm placed around 9-10 months ago per pt. Pt would undergo dialysis when the time is necessary. Pt on Bicarb and Fosrenol in the outpatient setting.  UA is improving and Renal US does not show obstructive process.Renal function at baseline, needs diuresis. 2) Fluid overload secondary to iatrogenic causes - Baseline weight 192, today is 201.  Diuresed 2 L yesterday and BNP elevated to 31,000.  Echo 1/15 showing EF 65-70% and grade 1 diastolic dysnfxn.  3) Diabetes Mellitus - SSI and Levemir  4) UTI - Completed Rocephin  5) Kyphoplasty s/p L2 compression fx with acute pain - Per primary  6) HTN  7) Anemia, Macrocytic vs anemia of renal disease - B12 and Folate  nml, Fe 30, sat % 15 8) Dysphagia - Planned EGD today to r/o stricture/esophagitis   Plan:  1) F/U PTH, continue on outpatient binders and bicarb  2) Fe studies low, will tx with feraheme/aranesp  3) Continue diuresis 160 mg TID.  Monitor BP/ I&Os.  As well would consider repeat Echo to evaluate for possible recent insult with decrease in EF.   4) Trend creatinine and eGFR    Nolon Rod, DO PGY-2, [facility] 10/12/2013, 7:41 AM I have seen and examined this patient and agree with the plan of care seen, eval, examined and counseled. Marland Kitchen  Reneka Nebergall L 10/12/2013, 5:01 PM

## 2013-10-12 NOTE — Progress Notes (Signed)
Report given to to receiving RN. Patient is stable with no complaints and no signs or symptoms of distress or discomfort.  

## 2013-10-12 NOTE — Progress Notes (Signed)
I, Shannen Flansburg A, RN cosign student RN Laura Caldwell's med administration, intake and output, assessment, etc. For this shift. 

## 2013-10-13 ENCOUNTER — Encounter (HOSPITAL_COMMUNITY): Payer: Self-pay | Admitting: Internal Medicine

## 2013-10-13 DIAGNOSIS — R778 Other specified abnormalities of plasma proteins: Secondary | ICD-10-CM | POA: Diagnosis not present

## 2013-10-13 DIAGNOSIS — R7989 Other specified abnormal findings of blood chemistry: Secondary | ICD-10-CM

## 2013-10-13 DIAGNOSIS — R9431 Abnormal electrocardiogram [ECG] [EKG]: Secondary | ICD-10-CM

## 2013-10-13 DIAGNOSIS — I5031 Acute diastolic (congestive) heart failure: Secondary | ICD-10-CM | POA: Diagnosis not present

## 2013-10-13 DIAGNOSIS — R799 Abnormal finding of blood chemistry, unspecified: Secondary | ICD-10-CM

## 2013-10-13 LAB — RENAL FUNCTION PANEL
ALBUMIN: 2.7 g/dL — AB (ref 3.5–5.2)
BUN: 42 mg/dL — ABNORMAL HIGH (ref 6–23)
CO2: 27 mEq/L (ref 19–32)
Calcium: 9 mg/dL (ref 8.4–10.5)
Chloride: 103 mEq/L (ref 96–112)
Creatinine, Ser: 2.45 mg/dL — ABNORMAL HIGH (ref 0.50–1.10)
GFR calc non Af Amer: 17 mL/min — ABNORMAL LOW (ref >90)
GFR, EST AFRICAN AMERICAN: 20 mL/min — AB (ref >90)
Glucose, Bld: 232 mg/dL — ABNORMAL HIGH (ref 70–99)
PHOSPHORUS: 3.6 mg/dL (ref 2.3–4.6)
Potassium: 3.9 mEq/L (ref 3.7–5.3)
SODIUM: 147 meq/L (ref 137–147)

## 2013-10-13 LAB — GLUCOSE, CAPILLARY
GLUCOSE-CAPILLARY: 304 mg/dL — AB (ref 70–99)
Glucose-Capillary: 204 mg/dL — ABNORMAL HIGH (ref 70–99)
Glucose-Capillary: 292 mg/dL — ABNORMAL HIGH (ref 70–99)
Glucose-Capillary: 336 mg/dL — ABNORMAL HIGH (ref 70–99)

## 2013-10-13 MED ORDER — INSULIN DETEMIR 100 UNIT/ML ~~LOC~~ SOLN
10.0000 [IU] | Freq: Every day | SUBCUTANEOUS | Status: DC
  Administered 2013-10-13: 10 [IU] via SUBCUTANEOUS
  Filled 2013-10-13: qty 0.1

## 2013-10-13 MED ORDER — INSULIN ASPART 100 UNIT/ML ~~LOC~~ SOLN
0.0000 [IU] | Freq: Three times a day (TID) | SUBCUTANEOUS | Status: DC
  Administered 2013-10-13 – 2013-10-14 (×2): 8 [IU] via SUBCUTANEOUS
  Administered 2013-10-14: 5 [IU] via SUBCUTANEOUS

## 2013-10-13 MED ORDER — METOPROLOL SUCCINATE ER 50 MG PO TB24
50.0000 mg | ORAL_TABLET | Freq: Every day | ORAL | Status: DC
  Filled 2013-10-13: qty 1

## 2013-10-13 MED ORDER — METOPROLOL SUCCINATE ER 25 MG PO TB24: 25.0000 mg | ORAL_TABLET | Freq: Every day | ORAL | Status: DC

## 2013-10-13 MED ORDER — FUROSEMIDE 80 MG PO TABS
160.0000 mg | ORAL_TABLET | Freq: Three times a day (TID) | ORAL | Status: DC
  Administered 2013-10-13 – 2013-10-14 (×3): 160 mg via ORAL
  Filled 2013-10-13 (×5): qty 2

## 2013-10-13 MED ORDER — CARVEDILOL 6.25 MG PO TABS
6.2500 mg | ORAL_TABLET | Freq: Two times a day (BID) | ORAL | Status: DC
  Administered 2013-10-13 – 2013-10-14 (×2): 6.25 mg via ORAL
  Filled 2013-10-13 (×4): qty 1

## 2013-10-13 MED ORDER — CALCITRIOL 0.25 MCG PO CAPS
0.2500 ug | ORAL_CAPSULE | Freq: Every day | ORAL | Status: DC
  Administered 2013-10-13 – 2013-10-14 (×2): 0.25 ug via ORAL
  Filled 2013-10-13 (×2): qty 1

## 2013-10-13 NOTE — Consult Note (Signed)
Daily Rounding Note  10/13/2013, 2:20 PM  LOS: 12 days   SUBJECTIVE:       Still having tightness when she swallows.   OBJECTIVE:         Vital signs in last 24 hours:    Temp:  [98.1 F (36.7 C)-98.4 F (36.9 C)] 98.1 F (36.7 C) (01/22 0624) Pulse Rate:  [80-92] 80 (01/22 0624) Resp:  [17-29] 20 (01/22 0624) BP: (167-214)/(40-98) 167/53 mmHg (01/22 0624) SpO2:  [94 %-100 %] 96 % (01/22 0624) Weight:  [88.315 kg (194 lb 11.2 oz)] 88.315 kg (194 lb 11.2 oz) (01/22 0624) Last BM Date: 10/13/13 General: frail, looks chronically ill   Heart: RRR Chest: clear.  + cough Abdomen: soft, NT, ND.  No mass or HSM  Neuro/Psych:  Some tremor of head.   Intake/Output from previous day: 01/21 0701 - 01/22 0700 In: 50 [I.V.:50] Out: 2275 [Urine:2275]  Intake/Output this shift: Total I/O In: 840 [P.O.:840] Out: 150 [Urine:150]  Lab Results:  Recent Labs  10/11/13 0358 10/12/13 0611  WBC 9.4 10.4  HGB 9.1* 9.4*  HCT 29.7* 30.3*  PLT 253 278   BMET  Recent Labs  10/11/13 1800 10/12/13 0610 10/13/13 0320  NA 145 148* 147  K 4.0 3.7 3.9  CL 104 107 103  CO2 25 26 27   GLUCOSE 215* 110* 232*  BUN 43* 42* 42*  CREATININE 2.62* 2.64* 2.45*  CALCIUM 8.7 8.9 9.0   LFT  Recent Labs  10/11/13 1800 10/12/13 0610 10/13/13 0320  ALBUMIN 2.7* 2.6* 2.7*   PT/INR No results found for this basename: LABPROT, INR,  in the last 72 hours Hepatitis Panel No results found for this basename: HEPBSAG, HCVAB, HEPAIGM, HEPBIGM,  in the last 72 hours  Studies/Results: US Renal  10/11/2013   CLINICAL DATA:  Chronic kidney disease  EXAM: RENAL/URINARY TRACT ULTRASOUND COMPLETE  COMPARISON:  US RENAL dated 06/07/2012; US RENAL dated 02/17/2012  FINDINGS: Right Kidney: 9.9 cm. Increased echogenicity and decreased cortical thickness. No hydronephrosis.  Left Kidney: 9.3 cm. Increased echogenicity and decreased cortical  thickness. No hydronephrosis.  Bladder:  Collapsed around a Foley catheter.  IMPRESSION: 1.  No hydronephrosis. 2. Findings of medical renal disease, including mild cortical thinning and increased echogenicity.   Electronically Signed   By: Abigail Miyamoto M.D.   On: 10/11/2013 19:32   Dg Chest Port 1 View  10/11/2013   CLINICAL DATA:  Respiratory distress and question fluid overload.  EXAM: PORTABLE CHEST - 1 VIEW  COMPARISON:  10/11/2013  FINDINGS: Again noted are diffuse interstitial lung densities suggesting edema. Heart size appears to be enlarged. Minimal change from the previous examination. Bibasilar densities are also concerning for pleural effusions.  IMPRESSION: Findings are most compatible with pulmonary edema and pleural effusions.  Heart size is prominent for size.   Electronically Signed   By: Markus Daft M.D.   On: 10/11/2013 20:02    ASSESMENT:   *  Dysphagia, acute issue this admission egd and empiric dilation of distal esophageal spasm has not improved her swallowing.  Would stick to soft diet.    PLAN   *  No further medical or endoscopic therapies to offer the pt.  Perhaps time will help with the swallowing.  *  Since no mucosal irritation on EGD, will stop Protonix.     Azucena Freed  10/13/2013, 2:20 PM Pager: [phone] Attending MD note:   I have taken a history,  and reviewed the chart. I agree with the Advanced Practitioner's impression and recommendations. Esophageal dismotility, hopefully will improve symptomatically as she gets over her acute illness and starts to ambulate  [facility] Pager # [phone]

## 2013-10-13 NOTE — Progress Notes (Signed)
[facility] Progress Note   Subjective:   Pt hungry this AM, no SOB at this time   Objective:   BP 167/53  Pulse 80  Temp(Src) 98.1 F (36.7 C) (Oral)  Resp 20  Ht $R'5\' 6"'gv$  (1.676 m)  Wt 194 lb 11.2 oz (88.315 kg)  BMI 31.44 kg/m2  SpO2 96%  Intake/Output Summary (Last 24 hours) at 10/13/13 0820 Last data filed at 10/13/13 8185  Gross per 24 hour  Intake     50 ml  Output   2275 ml  Net  -2225 ml   Weight change: -6 lb 5.8 oz (-2.885 kg)  Physical Exam: Gen: NAD today   HEENT: [place]/AT, MMM  Neck: No JVD evident  Cardio: RRR, 2/6 SEM holosystolic RUSB  Lungs: no increased WOB, Rales Bibasilar minimal  Abdomen: Soft, NT/ND, NABS, no CVA tenderness  Extremities: Trace pedal edema B/L   Neuro: AAO x 3, no focal deficits, no asterixis   Imaging: US Renal  10/11/2013   CLINICAL DATA:  Chronic kidney disease  EXAM: RENAL/URINARY TRACT ULTRASOUND COMPLETE  COMPARISON:  US RENAL dated 06/07/2012; US RENAL dated 02/17/2012  FINDINGS: Right Kidney: 9.9 cm. Increased echogenicity and decreased cortical thickness. No hydronephrosis.  Left Kidney: 9.3 cm. Increased echogenicity and decreased cortical thickness. No hydronephrosis.  Bladder:  Collapsed around a Foley catheter.  IMPRESSION: 1.  No hydronephrosis. 2. Findings of medical renal disease, including mild cortical thinning and increased echogenicity.   Electronically Signed   By: Abigail Miyamoto M.D.   On: 10/11/2013 19:32   Dg Chest Port 1 View  10/11/2013   CLINICAL DATA:  Respiratory distress and question fluid overload.  EXAM: PORTABLE CHEST - 1 VIEW  COMPARISON:  10/11/2013  FINDINGS: Again noted are diffuse interstitial lung densities suggesting edema. Heart size appears to be enlarged. Minimal change from the previous examination. Bibasilar densities are also concerning for pleural effusions.  IMPRESSION: Findings are most compatible with pulmonary edema and pleural effusions.  Heart size is prominent for size.    Electronically Signed   By: Markus Daft M.D.   On: 10/11/2013 20:02   Dg Chest Port 1 View  10/11/2013   CLINICAL DATA:  Short of breath.  Difficulty swelling.  EXAM: PORTABLE CHEST - 1 VIEW  COMPARISON:  10/08/2013 and 10/01/2013  FINDINGS: There are irregularly thickened interstitial markings bilaterally suggesting interstitial edema superimposed on chronic interstitial thickening. There are no areas of focal lung consolidation. No pleural effusion or pneumothorax is seen.  Cardiac silhouette is top-normal in size. No convincing mediastinal or hilar masses.  IMPRESSION: Irregular interstitial thickening increased from the prior exam suggesting interstitial edema superimposed on chronic interstitial thickening.   Electronically Signed   By: Lajean Manes M.D.   On: 10/11/2013 09:38    Labs: BMET  Recent Labs Lab 10/08/13 0540 10/09/13 0250 10/10/13 0350 10/11/13 0358 10/11/13 1800 10/12/13 0610 10/13/13 0320  NA 144 145 144 145 145 148* 147  K 4.3 4.0 4.2 4.2 4.0 3.7 3.9  CL 109 107 106 107 104 107 103  CO2 $Re'22 23 25 27 25 26 27  'MLg$ GLUCOSE 207* 82 133* 129* 215* 110* 232*  BUN 53* 50* 49* 45* 43* 42* 42*  CREATININE 2.74* 2.87* 3.01* 2.86* 2.62* 2.64* 2.45*  CALCIUM 7.9* 8.1* 8.3* 8.6 8.7 8.9 9.0  PHOS 3.9  --   --   --  2.6 3.3 3.6   CBC  Recent Labs Lab 10/08/13 0540 10/09/13 0250 10/10/13 0350  10/11/13 0358 10/12/13 0611  WBC 8.3 8.3 8.8 9.4 10.4  NEUTROABS 6.0  --   --   --   --   HGB 8.9* 8.5* 9.4* 9.1* 9.4*  HCT 29.6* 27.9* 30.8* 29.7* 30.3*  MCV 103.1* 102.6* 102.3* 101.4* 100.7*  PLT 260 251 247 253 278    Medications:    . amitriptyline  10 mg Oral QHS  . amLODipine  10 mg Oral Daily  . amoxicillin-clavulanate  500 mg Oral Q12H  . aspirin EC  81 mg Oral Daily  . atorvastatin  20 mg Oral q1800  . diltiazem  30 mg Oral Q8H  . febuxostat  40 mg Oral QHS  . furosemide  160 mg Oral BID  . insulin aspart  0-9 Units Subcutaneous TID AC & HS  . lanthanum  500 mg  Oral TID WC  . metoprolol succinate  12.5 mg Oral Daily  . pantoprazole  40 mg Oral Q1200  . sodium bicarbonate  650 mg Oral BID  . traMADol  50 mg Oral Q4H  . vitamin B-12  1,000 mcg Oral Daily      Assessment/ Plan:   [name] [name] is an 80 y.o. female with history of DM, HTN, CKD stage 4, who sustained a fall 10/01/13 and laid on the floor for about 8 hrs.   1) CKD stage IV - Pt may be at baseline, no renal damage secondary to rhabdo.  AVF in R arm placed around 9-10 months ago per pt. Pt would undergo dialysis when the time is necessary. Pt on Bicarb and Fosrenol in the outpatient setting.  UA improved and Renal US does not show obstructive process.  PTH 528.9  2) Fluid overload secondary to iatrogenic causes - Baseline weight 192, weight at 194 today.  Diuresed 2.2 L yesterday and BNP elevated to 31,000.  Echo 1/15 showing EF 65-70% and grade 1 diastolic dysnfxn. Will maximize po meds with goal of furthur diuresis. 3) Diabetes Mellitus - SSI and Levemir  4) UTI - Completed Rocephin  5) Kyphoplasty s/p L2 compression fx with acute pain - Per primary  6) HTN  7) Anemia, Macrocytic vs anemia of renal disease - B12 and Folate nml, Fe 30, sat % 15 8) Dysphagia - Normal EGD on 1/21  Plan:  1) Start Calcitriol, continue on outpatient binders and bicarb  2) Fe studies low, continue with feraheme/aranesp   3) Continue diuresis 160 mg BID.  Monitor BP/ I&Os 4) Trend creatinine and eGFR    Nolon Rod, DO PGY-2, [facility] 10/13/2013, 8:20 AM I have seen and examined this patient and agree with the plan of care  Seen, eval, examined and discussed with patient and resident. .  Jacaden Forbush L 10/13/2013, 11:22 AM

## 2013-10-13 NOTE — Progress Notes (Signed)
Physical Therapy Treatment Patient Details Name: [name] ELIZARRARAZ MRN: [id number] DOB: 1926/05/04 Today's Date: 10/13/2013 Time: 7793-9030 PT Time Calculation (min): 24 min  PT Assessment / Plan / Recommendation  History of Present Illness patient admitted s/p fall.  Patient with recent fall with L2 compression fx and h/o CKD.  Now with rhabdo.  w/p kyphoplasty 1/15   PT Comments   Pt with much improved mobility this treatment, able to transfer and gait with min A with RW.  Goals updated to supervision level for transfers.  Follow Up Recommendations  Supervision/Assistance - 24 hour;Home health PT     Does the patient have the potential to tolerate intense rehabilitation     Barriers to Discharge        Equipment Recommendations  Rolling walker with 5" wheels;3in1 (PT)    Recommendations for Other Services    Frequency Min 3X/week   Progress towards PT Goals Progress towards PT goals: Progressing toward goals  Plan Discharge plan needs to be updated    Precautions / Restrictions Precautions Precautions: Fall;Back Restrictions Weight Bearing Restrictions: No   Pertinent Vitals/Pain No c/o pain, spO2 93% with mobility on [place]    Mobility  Bed Mobility Bed Mobility: Supine to Sit Supine to sit: Min assist General bed mobility comments: assist at trunk Transfers Equipment used: Rolling walker (2 wheeled) Transfers: Sit to/from Stand Sit to Stand: Min assist General transfer comment: pt requires only min A, cues for hand placement to stand from bed and recliner this session Ambulation/Gait Ambulation/Gait assistance: Min assist Ambulation Distance (Feet): 10 Feet (10' x 3 attempts during session for activity tolerance) Assistive device: Rolling walker (2 wheeled) Gait velocity interpretation: <1.8 ft/sec, indicative of risk for recurrent falls    Exercises General Exercises - Lower Extremity Ankle Circles/Pumps: AROM;Both;10 reps Long Arc Quad: AROM;Both;10 reps      PT Goals (current goals can now be found in the care plan section)    Visit Information  Last PT Received On: 10/13/13 Assistance Needed: +1 History of Present Illness: patient admitted s/p fall.  Patient with recent fall with L2 compression fx and h/o CKD.  Now with rhabdo.  w/p kyphoplasty 1/15    Subjective Data      Cognition  Cognition Arousal/Alertness: Awake/alert Behavior During Therapy: WFL for tasks assessed/performed Overall Cognitive Status: Within Functional Limits for tasks assessed    Balance     End of Session PT - End of Session Equipment Utilized During Treatment: Gait belt;Oxygen Activity Tolerance: Patient tolerated treatment well Patient left: in chair;with call bell/phone within reach Nurse Communication: Mobility status   GP     Tarnisha Kachmar 10/13/2013, 2:14 PM

## 2013-10-13 NOTE — Progress Notes (Addendum)
[facility] PROGRESS NOTE  [name] [name] GUR:[id number] DOB: 05/26/1926 DOA: 10/01/2013 PCP: Ann Held, MD  Brief narrative  80 y.o. female with history of DM, HTN, CKD [stage 3-4], who sustained a fall 10/01/13 and laid on the floor for 7-10 hours. She was admitted on 10/02/13 via [facility] with acute on chronic renal failure due to rhabdomyolysis as well as severe hypoglycemia. Patient with recent L2 compression fracture and had been started on dilaudid and valium for outpatient pain management. She was treated with IV fluid with improvement. IR consulted and performed Kyphoplasty 1/15. Unfortunately patient Aspirated on 10/07/13 and although she is a DNR, was resuscitatd within the scope of her care and transferred to the SDU for IV antibiotics. Her daughter [name] has on numerous occasions during this Hospital stay expressed her dissatisfaction at our care for her mother and has been verbally disrespectful to nursing staff and MD's. Son appears to be reasonable.   Assessment/Plan:  Acute Respiratory failure  Likely Aspiration Pneumonitis along with fluid overload. diuresing well. Changed to high dose oral Lasix.   Acute Pulmonary Edema in the setting of Hypertrophic severe cardiomyopathy Developed acute pulm edema on 1/20 overnight requiring extra dose of IV lasix. Now stable on [place]. continue high dose oral lasix. . Elevated troponin likely due to demand ischemia. No further work up. ECHO with EF fo 70% ( unchanged ) and dynamic obstruction. .  Cardiology consulted. Recommended optimizing BP meds. Adjusted coreg dose.. dced cardizem. .  Aspiration Pneumonitis  Stable but still coughing and has choking sensation when she eats or drinks. Aspiration precautions. Change to Augmentin Susp.   Dysphagia  SLP suspects esophageal etiology and history suggests the same. Barium swallow shows esophageal dysmotility. Seen by SLP and on Dysphagia diet. However oral and pharyngeal phases appeared normal.   EGD unremarkable. continue PPI On dys level 1 diet  Rhabdomyolysis  Resolved. secondary to fall. Stopped IVF.   Diabetes Mellitus Type 2 with episodes of Hypoglycemia  Monitor CBG's closely.  Resume levemir. Check A1C of 6.4. Was on 70/30 at home which wil be switched to levemir on discharge.   Pyelonephritis with E coli  Had completed course of abx . Repeat UA was abnormal. Continue abx.   Constipation  Continue miralax   Back Pain  Status post kyphoplasty to L2 1/15. Seems to be better. Continue PT/OT.   CKD Stage 4  Creatinine unchanged .Marland Kitchen Continue to monitor urine output.   History of Hypertrophic cardiomyopathy  ECHO showed high EF of 28% with diastolic dysfunction. She did have a pause earlier this hospitalization. No recurrence seen.   Code Status: DNR  DVT Prophylaxis: sq Heparin  Family Communication: daughter will be updated  Disposition Plan:   SNF in am if stable      Objective: Filed Vitals:   10/13/13 0624  BP: 167/53  Pulse: 80  Temp: 98.1 F (36.7 C)  Resp: 20    Intake/Output Summary (Last 24 hours) at 10/13/13 1326 Last data filed at 10/13/13 3151  Gross per 24 hour  Intake    290 ml  Output   1600 ml  Net  -1310 ml   Filed Weights   10/11/13 0628 10/12/13 0407 10/13/13 0624  Weight: 91.763 kg (202 lb 4.8 oz) 91.2 kg (201 lb 1 oz) 88.315 kg (194 lb 11.2 oz)    Exam: General: Elderly female lying in bed in no acute distress  HEENT: No pallor, moist oral mucosa  Chest: diminished lung sounds at bases,  no rhonchi or wheezing  CVS: Normal S1-S2, no murmurs rub or gallop  Abdomen: Soft, nontender, nondistended, bowel sounds present, Foley in place  Extremities: Warm, 1+ pitting edema bilaterally  CNS: AAO x3   Data Reviewed: Basic Metabolic Panel:  Recent Labs Lab 10/08/13 0540  10/10/13 0350 10/11/13 0358 10/11/13 1800 10/12/13 0610 10/13/13 0320  NA 144  < > 144 145 145 148* 147  K 4.3  < > 4.2 4.2 4.0 3.7 3.9  CL 109   < > 106 107 104 107 103  CO2 22  < > 25 27 25 26 27   GLUCOSE 207*  < > 133* 129* 215* 110* 232*  BUN 53*  < > 49* 45* 43* 42* 42*  CREATININE 2.74*  < > 3.01* 2.86* 2.62* 2.64* 2.45*  CALCIUM 7.9*  < > 8.3* 8.6 8.7 8.9 9.0  PHOS 3.9  --   --   --  2.6 3.3 3.6  < > = values in this interval not displayed. Liver Function Tests:  Recent Labs Lab 10/08/13 0540 10/11/13 1800 10/12/13 0610 10/13/13 0320  ALBUMIN 2.4* 2.7* 2.6* 2.7*   No results found for this basename: LIPASE, AMYLASE,  in the last 168 hours No results found for this basename: AMMONIA,  in the last 168 hours CBC:  Recent Labs Lab 10/08/13 0540 10/09/13 0250 10/10/13 0350 10/11/13 0358 10/12/13 0611  WBC 8.3 8.3 8.8 9.4 10.4  NEUTROABS 6.0  --   --   --   --   HGB 8.9* 8.5* 9.4* 9.1* 9.4*  HCT 29.6* 27.9* 30.8* 29.7* 30.3*  MCV 103.1* 102.6* 102.3* 101.4* 100.7*  PLT 260 251 247 253 278   Cardiac Enzymes:  Recent Labs Lab 10/06/13 1401 10/07/13 0410 10/07/13 1600 10/09/13 1134 10/12/13 0830  CKTOTAL 212* 168  --   --   --   TROPONINI  --   --  0.36* 0.31* 0.38*   BNP (last 3 results)  Recent Labs  10/09/13 1134 10/12/13 0611  PROBNP 22117.0* 31197.0*   CBG:  Recent Labs Lab 10/12/13 1117 10/12/13 1705 10/12/13 2039 10/13/13 0640 10/13/13 1147  GLUCAP 145* 173* 253* 204* 304*    Recent Results (from the past 240 hour(s))  URINE CULTURE     Status: None   Collection Time    10/03/13 11:30 PM      Result Value Range Status   Specimen Description URINE, RANDOM   Final   Special Requests NONE   Final   Culture  Setup Time     Final   Value: 10/04/2013 01:10     Performed at [facility]     Final   Value: >=100,000 COLONIES/ML     Performed at [facility]   Culture     Final   Value: ESCHERICHIA COLI     Performed at [facility]   Report Status 10/06/2013 FINAL   Final   Organism ID, Bacteria ESCHERICHIA COLI   Final  MRSA PCR SCREENING      Status: Abnormal   Collection Time    10/07/13  3:08 PM      Result Value Range Status   MRSA by PCR POSITIVE (*) NEGATIVE Final   Comment:            The GeneXpert MRSA Assay (FDA     approved for NASAL specimens     only), is one component of a     comprehensive MRSA colonization  surveillance program. It is not     intended to diagnose MRSA     infection nor to guide or     monitor treatment for     MRSA infections.     RESULT CALLED TO, READ BACK BY AND VERIFIED WITH:     E. STANFIELD RN 17:00 10/07/13 ([email or web address])  CLOSTRIDIUM DIFFICILE BY PCR     Status: None   Collection Time    10/11/13  3:23 PM      Result Value Range Status   C difficile by pcr NEGATIVE  NEGATIVE Final     Studies: US Renal  10/11/2013   CLINICAL DATA:  Chronic kidney disease  EXAM: RENAL/URINARY TRACT ULTRASOUND COMPLETE  COMPARISON:  US RENAL dated 06/07/2012; US RENAL dated 02/17/2012  FINDINGS: Right Kidney: 9.9 cm. Increased echogenicity and decreased cortical thickness. No hydronephrosis.  Left Kidney: 9.3 cm. Increased echogenicity and decreased cortical thickness. No hydronephrosis.  Bladder:  Collapsed around a Foley catheter.  IMPRESSION: 1.  No hydronephrosis. 2. Findings of medical renal disease, including mild cortical thinning and increased echogenicity.   Electronically Signed   By: Abigail Miyamoto M.D.   On: 10/11/2013 19:32   Dg Chest Port 1 View  10/11/2013   CLINICAL DATA:  Respiratory distress and question fluid overload.  EXAM: PORTABLE CHEST - 1 VIEW  COMPARISON:  10/11/2013  FINDINGS: Again noted are diffuse interstitial lung densities suggesting edema. Heart size appears to be enlarged. Minimal change from the previous examination. Bibasilar densities are also concerning for pleural effusions.  IMPRESSION: Findings are most compatible with pulmonary edema and pleural effusions.  Heart size is prominent for size.   Electronically Signed   By: Markus Daft M.D.   On: 10/11/2013 20:02     Scheduled Meds: . amitriptyline  10 mg Oral QHS  . amLODipine  10 mg Oral Daily  . amoxicillin-clavulanate  500 mg Oral Q12H  . aspirin EC  81 mg Oral Daily  . atorvastatin  20 mg Oral q1800  . calcitRIOL  0.25 mcg Oral Daily  . carvedilol  6.25 mg Oral BID WC  . febuxostat  40 mg Oral QHS  . furosemide  160 mg Oral TID  . insulin aspart  0-9 Units Subcutaneous TID AC & HS  . lanthanum  500 mg Oral TID WC  . pantoprazole  40 mg Oral Q1200  . sodium bicarbonate  650 mg Oral BID  . traMADol  50 mg Oral Q4H  . vitamin B-12  1,000 mcg Oral Daily   Continuous Infusions:     Time spent: 25 minutes    Karesa Maultsby  [facility] Pager [phone] 7PM-7AM, please contact night-coverage at [email or web address], password [facility] 10/13/2013, 1:26 PM  LOS: 12 days

## 2013-10-13 NOTE — Consult Note (Addendum)
Admit date: 10/01/2013 Referring Physician  Dr. Clementeen Graham Primary Physician  Dr. Ann Held Primary Cardiologist  Dr. Percival Spanish Reason for Consultation  CHF  HPI: 80 y.o. female with history of DM, HTN, CKD [stage 3-4], who sustained a fall 10/01/13 and laid on the floor for 7-10 hours. She was admitted on 10/02/13 via [facility] with acute on chronic renal failure due to rhabdomyolysis as well as severe hypoglycemia. Patient with recent L2 compression fracture and had been started on dilaudid and valium for outpatient pain management. She was treated with IV fluid with improvement. IR consulted and performed Kyphoplasty 1/15. Unfortunately patient Aspirated on 10/07/13 and although she is a DNR, was resuscitatd within the scope of her care and transferred to the SDU for IV antibiotics. According to the Hospitalist notes her daughter [name] has on numerous occasions during this Hospital stay expressed her dissatisfaction at our care for her mother and has been verbally disrespectful to nursing staff and MD's. Son appears to be reasonable. The patient has a history of CKD stage 4 and developed volume overload during hospitalization due to fluid administration for rhabdo.  Her baseline weight per renal was 192.  She had an elevated BNP of 31,000 and 2D echo done on 1/13 showed normal LVF EF 65-70% with grade I diastolic dysfunction and dynamic LV outflow obstruction of 81mmHg.  Echo also showed moderate pulmonary HTN and mild MS.  On 1/21 during the night she developed acute pulmonary edema requiring extra doses of IV lasix.  Her troponin was elevated as well.  This was felt to be due to demand ischemia since she had a cath in 2008 showing nonobstructive coronary arteries.      PMH:   Past Medical History  Diagnosis Date  . Diabetes mellitus     mellitus? x25 years  . HTN (hypertension)     x2 years  . Hypercholesterolemia     x 2 years  . Renal insufficiency     mild to moderate  . Anemia     chronic  anemia due to chronic disease  . CAD (coronary artery disease)     nonobstructive   . GERD (gastroesophageal reflux disease)      PSH:   Past Surgical History  Procedure Laterality Date  . Total knee arthroplasty      L-2006; R-2008   . Appendectomy    . Back surgery      lower back  . Transthoracic echocardiogram  05/2011  . Av fistula placement  07/07/2012    Procedure: ARTERIOVENOUS (AV) FISTULA CREATION;  Surgeon: Mal Misty, MD;  Location: [facility] OR;  Service: Vascular;  Laterality: Right;  Brachial - cephalic arterivenous fistula  . Esophagogastroduodenoscopy (egd) with esophageal dilation N/A 10/12/2013    Procedure: ESOPHAGOGASTRODUODENOSCOPY (EGD) WITH ESOPHAGEAL DILATION;  Surgeon: Lafayette Dragon, MD;  Location: [facility] ENDOSCOPY;  Service: Endoscopy;  Laterality: N/A;  . Savory dilation N/A 10/12/2013    Procedure: SAVORY DILATION;  Surgeon: Lafayette Dragon, MD;  Location: [facility] ENDOSCOPY;  Service: Endoscopy;  Laterality: N/A;    Allergies:  Review of patient's allergies indicates no known allergies. Prior to Admit Meds:   Prescriptions prior to admission  Medication Sig Dispense Refill  . amitriptyline (ELAVIL) 10 MG tablet Take 10 mg by mouth at bedtime.       Marland Kitchen aspirin EC 81 MG tablet Take 81 mg by mouth daily.      Marland Kitchen atorvastatin (LIPITOR) 20 MG tablet Take 20 mg by mouth daily.      Marland Kitchen  Calcium-Magnesium-Vitamin D (CALCIUM 500 PO) Take 500 mg by mouth daily.      Marland Kitchen CINNAMON PO Take 2 tablets by mouth daily.      . diazepam (VALIUM) 5 MG tablet Take 5 mg by mouth every 12 (twelve) hours as needed for anxiety.      . febuxostat (ULORIC) 40 MG tablet Take 40 mg by mouth at bedtime.      . fish oil-omega-3 fatty acids 1000 MG capsule Take 1 g by mouth daily.       . furosemide (LASIX) 40 MG tablet Take 40 mg by mouth 2 (two) times daily.       Marland Kitchen GARLIC PO Take 1 tablet by mouth daily.      . Glucosamine-Chondroitin 250-200 MG CAPS Take 1 capsule by mouth daily.        Marland Kitchen HYDROmorphone  (DILAUDID) 4 MG tablet Take 4 mg by mouth every 4 (four) hours as needed for severe pain.      Marland Kitchen insulin aspart protamine- aspart (NOVOLOG MIX 70/30) (70-30) 100 UNIT/ML injection Inject 19-29 Units into the skin 2 (two) times daily with a meal. Uses 29 units in the morning and 19 units at night      . lanthanum (FOSRENOL) 500 MG chewable tablet Chew 500 mg by mouth 3 (three) times daily with meals.      . metoprolol succinate (TOPROL-XL) 25 MG 24 hr tablet Take 12.5 mg by mouth daily.       . Misc Natural Products (OSTEO BI-FLEX TRIPLE STRENGTH PO) Take 1 tablet by mouth daily.      . nateglinide (STARLIX) 60 MG tablet Take 60 mg by mouth every morning. Takes prior to large meal      . sodium bicarbonate 650 MG tablet Take 650 mg by mouth 2 (two) times daily.      . traMADol (ULTRAM-ER) 100 MG 24 hr tablet Take 100-200 mg by mouth daily as needed for pain.      . vitamin B-12 (CYANOCOBALAMIN) 1000 MCG tablet Take 1,000 mcg by mouth daily.       Fam HX:    Family History  Problem Relation Age of Onset  . Heart attack Father    Social HX:    History   Social History  . Marital Status: Widowed    Spouse Name: N/A    Number of Children: N/A  . Years of Education: N/A   Occupational History  . Not on file.   Social History Main Topics  . Smoking status: Never Smoker   . Smokeless tobacco: Never Used     Comment: no smoking   . Alcohol Use: No  . Drug Use: No  . Sexual Activity: Not Currently    Birth Control/ Protection: Post-menopausal   Other Topics Concern  . Not on file   Social History Narrative   Widowed. Retired and lives with her daughter.      ROS:  All 11 ROS were addressed and are negative except what is stated in the HPI  Physical Exam: Blood pressure 167/53, pulse 80, temperature 98.1 F (36.7 C), temperature source Oral, resp. rate 20, height 5\' 6"  (1.676 m), weight 194 lb 11.2 oz (88.315 kg), SpO2 96.00%.    General: Well developed, well nourished, in no  acute distress Head: Eyes PERRLA, No xanthomas.   Normal cephalic and atramatic  Lungs:   Clear bilaterally to auscultation and percussion. Heart:   HRRR S1 S2 Pulses are 2+ & equal. 2/6  SM at LUSB radiating to apex            No carotid bruit. No JVD.  No abdominal bruits. No femoral bruits. Abdomen: Bowel sounds are positive, abdomen soft and non-tender without masses  Extremities:  2+ edema of LLE Neuro: Alert and oriented X 3. Psych:  Good affect, responds appropriately    Labs:   Lab Results  Component Value Date   WBC 10.4 10/12/2013   HGB 9.4* 10/12/2013   HCT 30.3* 10/12/2013   MCV 100.7* 10/12/2013   PLT 278 10/12/2013    Recent Labs Lab 10/13/13 0320  NA 147  K 3.9  CL 103  CO2 27  BUN 42*  CREATININE 2.45*  CALCIUM 9.0  GLUCOSE 232*   No results found for this basename: PTT   Lab Results  Component Value Date   INR 1.07 10/06/2013   INR 1.0 04/09/2009   INR 1.0 RATIO 12/08/2006   Lab Results  Component Value Date   CKTOTAL 168 10/07/2013   TROPONINI 0.38* 10/12/2013     No results found for this basename: CHOL   No results found for this basename: HDL   No results found for this basename: LDLCALC   No results found for this basename: TRIG   No results found for this basename: CHOLHDL   No results found for this basename: LDLDIRECT      Radiology:  US Renal  10/11/2013   CLINICAL DATA:  Chronic kidney disease  EXAM: RENAL/URINARY TRACT ULTRASOUND COMPLETE  COMPARISON:  US RENAL dated 06/07/2012; US RENAL dated 02/17/2012  FINDINGS: Right Kidney: 9.9 cm. Increased echogenicity and decreased cortical thickness. No hydronephrosis.  Left Kidney: 9.3 cm. Increased echogenicity and decreased cortical thickness. No hydronephrosis.  Bladder:  Collapsed around a Foley catheter.  IMPRESSION: 1.  No hydronephrosis. 2. Findings of medical renal disease, including mild cortical thinning and increased echogenicity.   Electronically Signed   By: Abigail Miyamoto M.D.   On:  10/11/2013 19:32   Dg Chest Port 1 View  10/11/2013   CLINICAL DATA:  Respiratory distress and question fluid overload.  EXAM: PORTABLE CHEST - 1 VIEW  COMPARISON:  10/11/2013  FINDINGS: Again noted are diffuse interstitial lung densities suggesting edema. Heart size appears to be enlarged. Minimal change from the previous examination. Bibasilar densities are also concerning for pleural effusions.  IMPRESSION: Findings are most compatible with pulmonary edema and pleural effusions.  Heart size is prominent for size.   Electronically Signed   By: Markus Daft M.D.   On: 10/11/2013 20:02   Dg Chest Port 1 View  10/11/2013   CLINICAL DATA:  Short of breath.  Difficulty swelling.  EXAM: PORTABLE CHEST - 1 VIEW  COMPARISON:  10/08/2013 and 10/01/2013  FINDINGS: There are irregularly thickened interstitial markings bilaterally suggesting interstitial edema superimposed on chronic interstitial thickening. There are no areas of focal lung consolidation. No pleural effusion or pneumothorax is seen.  Cardiac silhouette is top-normal in size. No convincing mediastinal or hilar masses.  IMPRESSION: Irregular interstitial thickening increased from the prior exam suggesting interstitial edema superimposed on chronic interstitial thickening.   Electronically Signed   By: Lajean Manes M.D.   On: 10/11/2013 09:38    EKG:  Sinus tachycardia with anterior infarct and inferior ST abnormality - no signfiicant change from prior EKGs  ASSESSMENT:  1.  Acute diastolic CHF secondary to CKD stage 4 and recent volume resuscitation for treatment of rhabdo.  She also had evidence on echo  of dynamic outflow tract obstruction with gradient of 80mmHg and poorly controlled HTN which may also be contributing.  2.  Moderate pulmonary HTN most likely secondary to diastolic dysfunction/HTN but also need to consider other etiologies (OSA/primary lung disease/primary pulmonary HTN). 3.  Elevated troponin most c/w demand ischemia -  nonobstructive CAD on cath in 2008 so she could have developed progression of CAD since then but history most c/w demand ischemia.  I do not think this represents an acute coronary syndrome.  Her EKG is markedly abnormal but has not changed since the last EKG to compare in 2012.  LVF is normal. 4.  CKD stage 4  5.  DM 6.  HTN - poorly controlled 7. Anemia   PLAN:   1.  Continue diuretics 2.  Needs aggressive BP control. Continue amlodipine.  Increase Toprol to 50mg  daily and titrate as needed for BP control - I suspect we will need to continue to titrate this upward as HR allows since BP is very high.   3.  D/C Cardizem since she is already on a CCB and try to maximize beta blocker first.  If needed could switch amlodipine to Cardizem to help with diastolic dysfunction but I think maximizing beta blocker first is best option.    Will follow with you  Sueanne Margarita, MD  10/13/2013  8:26 AM

## 2013-10-14 DIAGNOSIS — I5031 Acute diastolic (congestive) heart failure: Secondary | ICD-10-CM

## 2013-10-14 DIAGNOSIS — I279 Pulmonary heart disease, unspecified: Secondary | ICD-10-CM

## 2013-10-14 DIAGNOSIS — S32020A Wedge compression fracture of second lumbar vertebra, initial encounter for closed fracture: Secondary | ICD-10-CM | POA: Diagnosis present

## 2013-10-14 DIAGNOSIS — J690 Pneumonitis due to inhalation of food and vomit: Secondary | ICD-10-CM | POA: Diagnosis present

## 2013-10-14 DIAGNOSIS — I509 Heart failure, unspecified: Secondary | ICD-10-CM

## 2013-10-14 LAB — RENAL FUNCTION PANEL
Albumin: 2.5 g/dL — ABNORMAL LOW (ref 3.5–5.2)
BUN: 42 mg/dL — ABNORMAL HIGH (ref 6–23)
CALCIUM: 8.9 mg/dL (ref 8.4–10.5)
CHLORIDE: 102 meq/L (ref 96–112)
CO2: 31 meq/L (ref 19–32)
CREATININE: 2.44 mg/dL — AB (ref 0.50–1.10)
GFR calc non Af Amer: 17 mL/min — ABNORMAL LOW (ref >90)
GFR, EST AFRICAN AMERICAN: 20 mL/min — AB (ref >90)
GLUCOSE: 261 mg/dL — AB (ref 70–99)
Phosphorus: 2.6 mg/dL (ref 2.3–4.6)
Potassium: 3.8 mEq/L (ref 3.7–5.3)
Sodium: 146 mEq/L (ref 137–147)

## 2013-10-14 LAB — GLUCOSE, CAPILLARY
Glucose-Capillary: 201 mg/dL — ABNORMAL HIGH (ref 70–99)
Glucose-Capillary: 297 mg/dL — ABNORMAL HIGH (ref 70–99)

## 2013-10-14 MED ORDER — CALCITRIOL 0.25 MCG PO CAPS: 0.2500 ug | ORAL_CAPSULE | Freq: Every day | ORAL | Status: DC

## 2013-10-14 MED ORDER — INSULIN DETEMIR 100 UNIT/ML ~~LOC~~ SOLN: 16.0000 [IU] | Freq: Every day | SUBCUTANEOUS | Status: DC

## 2013-10-14 MED ORDER — TRAMADOL HCL 50 MG PO TABS: 50.0000 mg | ORAL_TABLET | Freq: Four times a day (QID) | ORAL | Status: DC | PRN

## 2013-10-14 MED ORDER — METOPROLOL SUCCINATE ER 50 MG PO TB24: 50.0000 mg | ORAL_TABLET | Freq: Every day | ORAL | Status: DC

## 2013-10-14 MED ORDER — PANTOPRAZOLE SODIUM 40 MG PO TBEC: 40.0000 mg | DELAYED_RELEASE_TABLET | Freq: Every day | ORAL | Status: DC

## 2013-10-14 MED ORDER — GLUCERNA SHAKE PO LIQD: 237.0000 mL | Freq: Two times a day (BID) | ORAL | Status: DC

## 2013-10-14 MED ORDER — AMOXICILLIN-POT CLAVULANATE 250-62.5 MG/5ML PO SUSR: 500.0000 mg | Freq: Two times a day (BID) | ORAL | Status: AC

## 2013-10-14 MED ORDER — AMLODIPINE BESYLATE 10 MG PO TABS: 10.0000 mg | ORAL_TABLET | Freq: Every day | ORAL | Status: DC

## 2013-10-14 MED ORDER — GLUCERNA SHAKE PO LIQD
237.0000 mL | Freq: Two times a day (BID) | ORAL | Status: DC
  Administered 2013-10-14: 237 mL via ORAL

## 2013-10-14 MED ORDER — CARVEDILOL 6.25 MG PO TABS: 6.2500 mg | ORAL_TABLET | Freq: Two times a day (BID) | ORAL | Status: DC

## 2013-10-14 MED ORDER — PHENOL 1.4 % MT LIQD: 1.0000 | Freq: Three times a day (TID) | OROMUCOSAL | Status: DC | PRN

## 2013-10-14 MED ORDER — SODIUM BICARBONATE 650 MG PO TABS: 650.0000 mg | ORAL_TABLET | Freq: Every day | ORAL | Status: DC

## 2013-10-14 MED ORDER — INSULIN DETEMIR 100 UNIT/ML ~~LOC~~ SOLN
16.0000 [IU] | Freq: Every day | SUBCUTANEOUS | Status: DC
  Filled 2013-10-14: qty 0.16

## 2013-10-14 MED ORDER — FUROSEMIDE 80 MG PO TABS: 160.0000 mg | ORAL_TABLET | Freq: Two times a day (BID) | ORAL | Status: DC

## 2013-10-14 MED ORDER — ADULT MULTIVITAMIN W/MINERALS CH
1.0000 | ORAL_TABLET | Freq: Every day | ORAL | Status: DC
  Administered 2013-10-14: 1 via ORAL
  Filled 2013-10-14 (×2): qty 1

## 2013-10-14 MED ORDER — HYDROMORPHONE HCL 2 MG PO TABS: 1.0000 mg | ORAL_TABLET | Freq: Four times a day (QID) | ORAL | Status: DC | PRN

## 2013-10-14 MED ORDER — METOLAZONE 2.5 MG PO TABS: 2.5000 mg | ORAL_TABLET | Freq: Every day | ORAL | Status: DC

## 2013-10-14 NOTE — Progress Notes (Signed)
Patient states she feel like she can not breath. Oxygen saturations taken and result -95%. Patient went on to further state, "It feels like an anxiety attack." Therefore patient given Valium per PRN order and will monitor patient for effectiveness. Patient repositioned  And turned in the bed with help of fellow staff members. Will continue to monitor patient to end of shift.

## 2013-10-14 NOTE — Progress Notes (Signed)
TELEMETRY: Reviewed telemetry pt in [facility]: Filed Vitals:   10/13/13 1519 10/13/13 2038 10/14/13 0500 10/14/13 1120  BP: 138/52 123/44 110/72 156/56  Pulse: 77 61 76   Temp: 98.5 F (36.9 C) 98.3 F (36.8 C) 97.8 F (36.6 C)   TempSrc: Oral Oral Oral   Resp: 20 20 20    Height:      Weight:   196 lb 3.4 oz (89 kg)   SpO2: 97% 100% 100%     Intake/Output Summary (Last 24 hours) at 10/14/13 1205 Last data filed at 10/14/13 0841  Gross per 24 hour  Intake    985 ml  Output   1725 ml  Net   -740 ml    SUBJECTIVE States she still gets SOB at times. Not associated with activity. Overall improved.   LABS: Basic Metabolic Panel:  Recent Labs  10/13/13 0320 10/14/13 0357  NA 147 146  K 3.9 3.8  CL 103 102  CO2 27 31  GLUCOSE 232* 261*  BUN 42* 42*  CREATININE 2.45* 2.44*  CALCIUM 9.0 8.9  PHOS 3.6 2.6   Liver Function Tests:  Recent Labs  10/13/13 0320 10/14/13 0357  ALBUMIN 2.7* 2.5*   No results found for this basename: LIPASE, AMYLASE,  in the last 72 hours CBC:  Recent Labs  10/12/13 0611  WBC 10.4  HGB 9.4*  HCT 30.3*  MCV 100.7*  PLT 278   Cardiac Enzymes:  Recent Labs  10/12/13 0830  TROPONINI 0.38*   BNP: 31197 on 09/2209/15Hemoglobin A1C:  Recent Labs  10/12/13 1136  HGBA1C 6.4*   Anemia Panel:  Recent Labs  10/11/13 1800  VITAMINB12 >2000*  FOLATE 8.1  TIBC 202*  IRON 30*    Radiology/Studies:  Ct Head Wo Contrast  10/07/2013   CLINICAL DATA:  Evaluate for intracranial abnormalities  EXAM: CT HEAD WITHOUT CONTRAST  TECHNIQUE: Contiguous axial images were obtained from the base of the skull through the vertex without intravenous contrast.  COMPARISON:  None.  FINDINGS: There is no evidence of acute hemorrhage nor extra-axial fluid collections. There is diffuse cortical atrophy as well as diffuse areas of low attenuation within the subcortical, deep, and periventricular white matter regions. Ventricle is cisterns are patent.  There is no evidence of subfalcine or tonsillar herniation. The cerebellum, pons, and basal ganglia regions are unremarkable.  There is no evidence of a depressed skull fracture. The visualized paranasal sinuses and mastoid air cells are patent.  IMPRESSION: Chronic and involutional changes without evidence of focal or acute abnormalities.   Electronically Signed   By: Margaree Mackintosh M.D.   On: 10/07/2013 16:58   Dg Esophagus  10/10/2013   CLINICAL DATA:  Dysphagia  EXAM: ESOPHOGRAM/BARIUM SWALLOW  TECHNIQUE: Single contrast examination was performed using  thin barium.  COMPARISON:  None.  FLUOROSCOPY TIME:  49 seconds  FINDINGS: Moderate esophageal dysmotility with numerous non-peristaltic/tertiary contractions.  No definite fixed esophageal narrowing or stricture. However, a barium tablet was not administered since the patient could not stand upright.  Small hiatal hernia.  Gastroesophageal reflux could not be assessed due to the patient's inability to clear her esophagus.  IMPRESSION: Moderate esophageal dysmotility.  No definite fixed esophageal narrowing or stricture.  Small hiatal hernia.   Electronically Signed   By: Julian Hy M.D.   On: 10/10/2013 14:38   US Renal  10/11/2013   CLINICAL DATA:  Chronic kidney disease  EXAM: RENAL/URINARY TRACT ULTRASOUND COMPLETE  COMPARISON:  US RENAL dated 06/07/2012;  US RENAL dated 02/17/2012  FINDINGS: Right Kidney: 9.9 cm. Increased echogenicity and decreased cortical thickness. No hydronephrosis.  Left Kidney: 9.3 cm. Increased echogenicity and decreased cortical thickness. No hydronephrosis.  Bladder:  Collapsed around a Foley catheter.  IMPRESSION: 1.  No hydronephrosis. 2. Findings of medical renal disease, including mild cortical thinning and increased echogenicity.   Electronically Signed   By: Abigail Miyamoto M.D.   On: 10/11/2013 19:32   Dg Chest Port 1 View  10/11/2013   CLINICAL DATA:  Respiratory distress and question fluid overload.  EXAM:  PORTABLE CHEST - 1 VIEW  COMPARISON:  10/11/2013  FINDINGS: Again noted are diffuse interstitial lung densities suggesting edema. Heart size appears to be enlarged. Minimal change from the previous examination. Bibasilar densities are also concerning for pleural effusions.  IMPRESSION: Findings are most compatible with pulmonary edema and pleural effusions.  Heart size is prominent for size.   Electronically Signed   By: Markus Daft M.D.   On: 10/11/2013 20:02   Dg Chest Port 1 View  10/11/2013   CLINICAL DATA:  Short of breath.  Difficulty swelling.  EXAM: PORTABLE CHEST - 1 VIEW  COMPARISON:  10/08/2013 and 10/01/2013  FINDINGS: There are irregularly thickened interstitial markings bilaterally suggesting interstitial edema superimposed on chronic interstitial thickening. There are no areas of focal lung consolidation. No pleural effusion or pneumothorax is seen.  Cardiac silhouette is top-normal in size. No convincing mediastinal or hilar masses.  IMPRESSION: Irregular interstitial thickening increased from the prior exam suggesting interstitial edema superimposed on chronic interstitial thickening.   Electronically Signed   By: Lajean Manes M.D.   On: 10/11/2013 09:38   Dg Chest Port 1 View  10/08/2013   CLINICAL DATA:  Aspiration pneumonia  EXAM: PORTABLE CHEST - 1 VIEW  COMPARISON:  October 07, 2013  FINDINGS: Mild cardiac enlargement. Mild vascular congestion. Right lung is clear. Mild hazy opacity left base suggests small effusion, stable. No focal consolidation.  IMPRESSION: Vascular congestion with stable small left effusion.   Electronically Signed   By: Skipper Cliche M.D.   On: 10/08/2013 07:18   Dg Chest Port 1 View  10/07/2013   CLINICAL DATA:  Possible aspiration.  EXAM: PORTABLE CHEST - 1 VIEW  COMPARISON:  Chest 10/01/2013.  FINDINGS: The patient is rotated on this study. There is interstitial pulmonary edema. Left basilar atelectasis is noted. There may be a small left effusion. No focal  process on the right is seen. There is no pneumothorax.  IMPRESSION: Negative for aspiration.  Interstitial edema and small left effusion.   Electronically Signed   By: Inge Rise M.D.   On: 10/07/2013 14:17   Dg Radiologist Eval And Mgmt  09/30/2013   EXAM: NEW PATIENT OFFICE VISIT - LEVEL II IO:[id number])  MEDICATIONS AND MEDICAL HISTORY: Past Medical History: Diabetes, hypertension, hypercholesterolemia, chronic renal insufficiency, anemia, coronary artery disease, GE reflux. Previous lumbar surgery 2010, knee replacement surgery x2. Dialysis shunt, has not used for dialysis yet.  Medications: amitriptyline (ELAVIL) 10 MG tablet Calcium Citrate-Vitamin D (CALCIUM CITRATE + PO) CINNAMON PO doxycycline (VIBRAMYCIN) 100 MG capsule fish oil-omega-3 fatty acids 1000 MG capsule furosemide (LASIX) 40 MG tablet Garlic POWD Glucosamine-Chondroitin 250-200 MG CAPS insulin NPH-insulin regular (HUMULIN 70/30) (70-30) 100 UNIT/ML injection lisinopril (PRINIVIL,ZESTRIL) 5 MG tablet metoprolol succinate (TOPROL-XL) 25 MG 24 hr tablet Misc Natural Products (OSTEO BI-FLEX TRIPLE STRENGTH PO) nateglinide (STARLIX) 60 MG tablet oxyCODONE (ROXICODONE) 5 MG immediate release tablet sodium bicarbonate 650 MG tablet  Allergies: No known.  No allergy to latex or iodinated contrast.  Social History: Widowed, Lives with her daughter, who accompanied her to the visit. No tobacco or alcohol use.  Family History: CVA in father and mother. Diabetes in maternal grandfather. Heart disease in father.  HISTORY OF PRESENT ILLNESS: Pleasant 80year old female describes development of low lumbar pain in late December without any obvious inciting event. Pain is experienced centrally in the lower lumbar spine. No radicular component. No new bowel or bladder control issues. She has a history previous lumbar surgery at L4-5 ; this does not represent recurrence of the same symptoms. Symptoms are moderately severe, exacerbated by standing, lifting, and  bending, relief when recumbent. She had been on oxycodone which lowered the pain to 6 out of 10 on the visual analog scale. She scores 20 out of 24 on the [employer] disability questionnaire. Previously, she could ambulate independently without assistance. Her pain result sitting in her no longer being able to drive. She no longer can sleep comfortably in the bed but has learned to sleep in a chair fairly comfortably. Pain medication was recently changed to a Dilaudid and she has not started this new narcotic pain medication regimen.  CHIEF COMPLAINT: Back pain  PHYSICAL EXAMINATION: Examined sitting. Normal mood and affect. Blood pressure 166/48, pulse 70, respirations 18, afebrile, O2 sats 96% on room air. Tenderness over lower lumbar spine.  Imaging:  MR lumbar spine from 09/24/2013 significant for L2 compression fracture deformity with approximately 50% loss of height, mild posterior retropulsion. There is diffusely abnormal marrow signal, nonspecific.  REVIEW OF SYSTEMS: Negative for myocardial infarction, stroke, or bleeding diathesis. Positive for numbness.  ASSESSMENT AND PLAN: My impression is that this pleasant patient has a non- healed L2 compression fracture deformity, likely etiology of her continued lower lumbar pain. She has yet to start a trial of her recently prescribed narcotic pain medication to see how her symptoms may respond to this. If pain persists at an unacceptable level or should she not tolerate the meds, I think she would anatomically be an appropriate candidate for kyphoplasty/ vertebroplasty based on review of the MRI. I discussed with the patient and her daughter the procedures, anticipated benefits, limitations, possible risks and complications, and alternatives. They seemed to understand, their questions were answered, and they were motivated to proceed. Accordingly, we will check back with her on Monday to see how she has been doing on her new Dilaudid pain medication. If  needed, we can proceed with treatment at the L2 level at their convenience.  I appreciate your referral of this very pleasant patient. We will keep you updated with her progress.   Electronically Signed   By: Arne Cleveland M.D.   On: 09/30/2013 11:59   Ir Kypho Vertebral Lumbar Augmentation  10/07/2013   CLINICAL DATA:  L2 compression fracture  EXAM: L2 KYPHOPLASTY  MEDICATIONS AND MEDICAL HISTORY: Versed 4 mg, Fentanyl 250 mcg.  As antibiotic prophylaxis, Ancef was ordered pre-procedure and administered intravenously within one hour of incision.  ANESTHESIA/SEDATION: Moderate sedation time: 40 minutes  CONTRAST:  None  FLUOROSCOPY TIME:  5 min and 42 seconds.  PROCEDURE: The procedure, risks, benefits, and alternatives were explained to the patient. Questions regarding the procedure were encouraged and answered. The patient understands and consents to the procedure.  The back was prepped with Betadine in a sterile fashion, and a sterile drape was applied covering the operative field. A sterile gown and sterile gloves were used for  the procedure.  Under fluoroscopic guidance, a 10 gauge needle was inserted into the L2 vertebral body via right transpedicular approach. A 14 gauge core biopsy was obtained. A drill was utilized to create a tract to the anterior cortex. A 1.5 cm balloon was advanced into the vertebral body and insufflated at the midline. The balloon was removed and contrast was utilized to fill the cavity with interdigitation. The needle was removed.  FINDINGS: Images demonstrate needle and balloon placement in the L2 vertebral body via a right transpedicular approach. The final image demonstrates cement filling the vertebral body, crossing the midline, and extending from the inferior to superior end plate. No extra osseous cement.  IMPRESSION: Successful L2 KYPHOPLASTY.   Electronically Signed   By: Maryclare Bean M.D.   On: 10/07/2013 08:54    PHYSICAL EXAM General: Well developed, elderly, well  nourished, in no acute distress. Head: Normocephalic, atraumatic, sclera non-icteric, no xanthomas, nares are without discharge. Neck: Negative for carotid bruits. JVD not elevated. Lungs: Clear bilaterally to auscultation without wheezes, rales, or rhonchi. Breathing is unlabored. Heart: RRR S1 S2 with 2/6 systolic ejection murmur LSB Abdomen: Soft, non-tender, non-distended with normoactive bowel sounds. No hepatomegaly. No rebound/guarding. No obvious abdominal masses. Msk:  Strength and tone appears normal for age. Extremities: 1-2+ edema.  Distal pedal pulses are 2+ and equal bilaterally. Neuro: Alert and oriented X 3. Moves all extremities spontaneously. Psych:  Responds to questions appropriately with a normal affect.  ASSESSMENT AND PLAN: 1. Acute diastolic CHF secondary to CKD stage 4 and recent volume resuscitation for treatment of rhabdo. She also had evidence on echo of dynamic outflow tract obstruction with gradient of 66mmHg and poorly controlled HTN which may also be contributing.  2. Moderate pulmonary HTN most likely secondary to diastolic dysfunction/HTN but also need to consider other etiologies (OSA/primary lung disease/primary pulmonary HTN).  3. Elevated troponin most c/w demand ischemia - nonobstructive CAD on cath in 2008 so she could have developed progression of CAD since then but history most c/w demand ischemia. I do not think this represents an acute coronary syndrome. Her EKG is markedly abnormal but has not changed since the last EKG to compare in 2012. LVF is normal.  4. CKD stage 4  5. DM  6. HTN  7. Anemia   PLAN:  1. Continue diuretics with lasix 160 mg bid. 2. BP control is much improved today. Continue amlodipine.  Toprol  50mg  daily and titrate as needed for BP control . 3. Plan for transfer to SNF today. Recommend follow up with cardiology in 2 weeks- Dr. Percival Spanish- will make appt.   Principal Problem:   Acute respiratory failure Active Problems:    CKD (chronic kidney disease) stage 4, GFR 15-29 ml/min   Rhabdomyolysis   Hypoglycemia   UTI (urinary tract infection)   Hyperkalemia   Hypertrophic obstructive cardiomyopathy(425.11)   Dysphagia, unspecified(787.20)   Malnutrition of moderate degree   Other dysphagia   Acute diastolic CHF (congestive heart failure)   Elevated troponin   Aspiration pneumonia   Compression fracture of L2 lumbar vertebra    Signed, Regene Mccarthy [place] MD,FACC 10/14/2013 12:10 PM

## 2013-10-14 NOTE — Progress Notes (Signed)
MD ordered to keep foley in due to high dose of lasix and pt's dyspnea with exertion.  Will let [facility] know, and carry out MD orders.

## 2013-10-14 NOTE — Discharge Summary (Signed)
Physician Discharge Summary  [name] [name] DDU:[id number] DOB: 04-02-1926 DOA: 10/01/2013  PCP: Ann Held, MD  Admit date: 10/01/2013 Discharge date: 10/14/2013  Time spent: 40  minutes  Recommendations for Outpatient Follow-up:  1. Discharge to SNF with outpt renal and cardiology follow up 2. Patient made DNR during the hospitalization after discussion with family. May need discussion for palliative care in future given progressive disease.  3. given requirement of high dose of lasix and poor mobility , will discharge on foley catheter for convenience.  Discharge Diagnoses:  Principal Problem:   Acute respiratory failure  Active Problems:   Hypertrophic obstructive [id number](073.71)   Acute diastolic CHF (congestive heart failure)   Compression fracture of L2 lumbar vertebra   CKD (chronic kidney disease) stage 4, GFR 15-29 ml/min   Rhabdomyolysis   Hypoglycemia   UTI (urinary tract infection)   Hyperkalemia   Dysphagia, unspecified(787.20)   Malnutrition of moderate degree   Other dysphagia   Elevated troponin   Aspiration pneumonia   Discharge Condition: fair , high risk for decompensation and rehospitalization  Diet recommendation: cardiac  Filed Weights   10/12/13 0407 10/13/13 0624 10/14/13 0500  Weight: 91.2 kg (201 lb 1 oz) 88.315 kg (194 lb 11.2 oz) 89 kg (196 lb 3.4 oz)    History of present illness:  Please refer to admission H&P for details, but in brief,   80 y.o. female with history of DM, HTN, CKD [stage 3-4], who sustained a fall 10/01/13 and laid on the floor for 7-10 hours. She was admitted on 10/02/13 via [facility] with acute on chronic renal failure due to rhabdomyolysis as well as severe hypoglycemia. Patient with recent L2 compression fracture and had been started on dilaudid and valium for outpatient pain management. She was treated with IV fluid with improvement. IR consulted and performed Kyphoplasty 1/15. Unfortunately patient Aspirated on  10/07/13 and although she is a DNR, was resuscitatd within the scope of her care and transferred to the SDU for IV antibiotics.  Hospital course prolonged due to development of acute diastolic CHF and dysphagia.   Hospital Course:  Acute Respiratory failure  Likely Aspiration Pneumonitis along with fluid overload. diuresing well. Changed to high dose oral Lasix 160 mg bid. Will add zaroxolyn as well.  Patient improving but sill dyspnic on minimal exertion.   Acute Pulmonary Edema in the setting of Hypertrophic severe cardiomyopathy  Developed acute pulm edema on 1/20 overnight requiring extra dose of IV lasix. Now stable on Rufus. continue high dose oral lasix. . Elevated troponin likely due to demand ischemia. No further work up. ECHO with EF fo 70% ( unchanged ) and dynamic obstruction.  Cardiology consulted. Recommended optimizing BP meds. Adjusted coreg and increased toprol. Continue amlodipine.    Aspiration Pneumonitis   Aspiration precautions maintained . Antibiotics Changed to Augmentin Susp. completes 2 weeks of abx tomorrow.  Dysphagia  SLP suspects esophageal etiology and history suggests the same. Barium swallow shows esophageal dysmotility. Seen by SLP and on Dysphagia diet. However oral and pharyngeal phases appeared normal.  EGD unremarkable. continue PPI  On dys level 1 diet   Rhabdomyolysis  Resolved with IV fluids. secondary to fall.    Diabetes Mellitus Type 2 with episodes of Hypoglycemia   A1C of 6.4. Was on 70/30 at home which wil be switched to levemir on discharge.  Continue home oral hypoglycemic  Pyelonephritis with E coli   Repeat UA was abnormal. Completed  abx  Constipation  Continue miralax  Back Pain with L2 compression fracture Status post kyphoplasty to L2 1/15. Seems to be better. Continue PT/OT in SNF./ pain control with tramadol and low dose dilaudid.   CKD Stage 4  Creatinine unchanged . Has good urine output. Follow up as outpt  History of  Hypertrophic cardiomyopathy  ECHO showed high EF of 32% with diastolic dysfunction. She did have a pause earlier this hospitalization. No recurrence seen. Follow up with cardiolgoy as outpt  Code Status: DNR   Family Communication: daughter called and left a message Disposition Plan:  SNF       Discharge Exam: Filed Vitals:   10/14/13 0500  BP: 110/72  Pulse: 76  Temp: 97.8 F (36.6 C)  Resp: 20    General: Elderly female lying in bed in no acute distress  HEENT: No pallor, moist oral mucosa  Chest: diminished lung sounds at bases, no rhonchi or wheezing  CVS: Normal S1-S2, no murmurs rub or gallop  Abdomen: Soft, nontender, nondistended, bowel sounds present, Foley in place  Extremities: Warm, 1+ pitting edema bilaterally  CNS: AAO x3  Discharge Instructions     Medication List    STOP taking these medications       insulin aspart protamine- aspart (70-30) 100 UNIT/ML injection  Commonly known as:  NOVOLOG MIX 70/30     traMADol 100 MG 24 hr tablet  Commonly known as:  ULTRAM-ER  Replaced by:  traMADol 50 MG tablet      TAKE these medications       amitriptyline 10 MG tablet  Commonly known as:  ELAVIL  Take 10 mg by mouth at bedtime.     amLODipine 10 MG tablet  Commonly known as:  NORVASC  Take 1 tablet (10 mg total) by mouth daily.     amoxicillin-clavulanate 250-62.5 MG/5ML suspension  Commonly known as:  AUGMENTIN  Take 10 mLs (500 mg total) by mouth every 12 (twelve) hours.     aspirin EC 81 MG tablet  Take 81 mg by mouth daily.     atorvastatin 20 MG tablet  Commonly known as:  LIPITOR  Take 20 mg by mouth daily.     calcitRIOL 0.25 MCG capsule  Commonly known as:  ROCALTROL  Take 1 capsule (0.25 mcg total) by mouth daily.     CALCIUM 500 PO  Take 500 mg by mouth daily.     carvedilol 6.25 MG tablet  Commonly known as:  COREG  Take 1 tablet (6.25 mg total) by mouth 2 (two) times daily with a meal.     CINNAMON PO  Take 2 tablets  by mouth daily.     diazepam 5 MG tablet  Commonly known as:  VALIUM  Take 5 mg by mouth every 12 (twelve) hours as needed for anxiety.     febuxostat 40 MG tablet  Commonly known as:  ULORIC  Take 40 mg by mouth at bedtime.     feeding supplement (GLUCERNA SHAKE) Liqd  Take 237 mLs by mouth 2 (two) times daily between meals.     fish oil-omega-3 fatty acids 1000 MG capsule  Take 1 g by mouth daily.     furosemide 80 MG tablet  Commonly known as:  LASIX  Take 2 tablets (160 mg total) by mouth 2 (two) times daily.     GARLIC PO  Take 1 tablet by mouth daily.     Glucosamine-Chondroitin 250-200 MG Caps  Take 1 capsule by mouth daily.     HYDROmorphone  2 MG tablet  Commonly known as:  DILAUDID  Take 0.5 tablets (1 mg total) by mouth every 6 (six) hours as needed for severe pain.     insulin detemir 100 UNIT/ML injection  Commonly known as:  LEVEMIR  Inject 0.16 mLs (16 Units total) into the skin at bedtime.     lanthanum 500 MG chewable tablet  Commonly known as:  FOSRENOL  Chew 500 mg by mouth 3 (three) times daily with meals.     metoprolol succinate 50 MG 24 hr tablet  Commonly known as:  TOPROL-XL  Take 1 tablet (50 mg total) by mouth daily.     nateglinide 60 MG tablet  Commonly known as:  STARLIX  Take 60 mg by mouth every morning. Takes prior to large meal     OSTEO BI-FLEX TRIPLE STRENGTH PO  Take 1 tablet by mouth daily.     pantoprazole 40 MG tablet  Commonly known as:  PROTONIX  Take 1 tablet (40 mg total) by mouth daily at 12 noon.     phenol 1.4 % Liqd  Commonly known as:  CHLORASEPTIC  Use as directed 1 spray in the mouth or throat 3 (three) times daily as needed for throat irritation / pain.     sodium bicarbonate 650 MG tablet  Take 1 tablet (650 mg total) by mouth daily.     traMADol 50 MG tablet  Commonly known as:  ULTRAM  Take 1 tablet (50 mg total) by mouth every 6 (six) hours as needed.     vitamin B-12 1000 MCG tablet  Commonly  known as:  CYANOCOBALAMIN  Take 1,000 mcg by mouth daily.        Tab zaroxolyn 2.5 mg        1 tab po daily   No Known Allergies     Follow-up Information   Follow up with Ann Held, MD In 1 week. (after discharge from rehab)    Specialty:  Unknown Physician Specialty   Contact information:   [address] [place] [address] [phone]       Follow up with DETERDING,JAMES L, MD. Schedule an appointment as soon as possible for a visit in 3 weeks.   Specialty:  Nephrology   Contact information:   [place] [address] [phone]       Follow up with Minus Breeding, MD. Schedule an appointment as soon as possible for a visit in 4 weeks.   Specialty:  Cardiology   Contact information:   [id number], [place] [address] [phone]        The results of significant diagnostics from this hospitalization (including imaging, microbiology, ancillary and laboratory) are listed below for reference.    Significant Diagnostic Studies: Ct Head Wo Contrast  10/07/2013   CLINICAL DATA:  Evaluate for intracranial abnormalities  EXAM: CT HEAD WITHOUT CONTRAST  TECHNIQUE: Contiguous axial images were obtained from the base of the skull through the vertex without intravenous contrast.  COMPARISON:  None.  FINDINGS: There is no evidence of acute hemorrhage nor extra-axial fluid collections. There is diffuse cortical atrophy as well as diffuse areas of low attenuation within the subcortical, deep, and periventricular white matter regions. Ventricle is cisterns are patent. There is no evidence of subfalcine or tonsillar herniation. The cerebellum, pons, and basal ganglia regions are unremarkable.  There is no evidence of a depressed skull fracture. The visualized paranasal sinuses and mastoid air cells are  patent.  IMPRESSION: Chronic and involutional changes without evidence of focal or acute abnormalities.   Electronically Signed    By: Margaree Mackintosh M.D.   On: 10/07/2013 16:58   Dg Esophagus  10/10/2013   CLINICAL DATA:  Dysphagia  EXAM: ESOPHOGRAM/BARIUM SWALLOW  TECHNIQUE: Single contrast examination was performed using  thin barium.  COMPARISON:  None.  FLUOROSCOPY TIME:  49 seconds  FINDINGS: Moderate esophageal dysmotility with numerous non-peristaltic/tertiary contractions.  No definite fixed esophageal narrowing or stricture. However, a barium tablet was not administered since the patient could not stand upright.  Small hiatal hernia.  Gastroesophageal reflux could not be assessed due to the patient's inability to clear her esophagus.  IMPRESSION: Moderate esophageal dysmotility.  No definite fixed esophageal narrowing or stricture.  Small hiatal hernia.   Electronically Signed   By: Julian Hy M.D.   On: 10/10/2013 14:38   US Renal  10/11/2013   CLINICAL DATA:  Chronic kidney disease  EXAM: RENAL/URINARY TRACT ULTRASOUND COMPLETE  COMPARISON:  US RENAL dated 06/07/2012; US RENAL dated 02/17/2012  FINDINGS: Right Kidney: 9.9 cm. Increased echogenicity and decreased cortical thickness. No hydronephrosis.  Left Kidney: 9.3 cm. Increased echogenicity and decreased cortical thickness. No hydronephrosis.  Bladder:  Collapsed around a Foley catheter.  IMPRESSION: 1.  No hydronephrosis. 2. Findings of medical renal disease, including mild cortical thinning and increased echogenicity.   Electronically Signed   By: Abigail Miyamoto M.D.   On: 10/11/2013 19:32   Dg Chest Port 1 View  10/11/2013   CLINICAL DATA:  Respiratory distress and question fluid overload.  EXAM: PORTABLE CHEST - 1 VIEW  COMPARISON:  10/11/2013  FINDINGS: Again noted are diffuse interstitial lung densities suggesting edema. Heart size appears to be enlarged. Minimal change from the previous examination. Bibasilar densities are also concerning for pleural effusions.  IMPRESSION: Findings are most compatible with pulmonary edema and pleural effusions.  Heart size  is prominent for size.   Electronically Signed   By: Markus Daft M.D.   On: 10/11/2013 20:02   Dg Chest Port 1 View  10/11/2013   CLINICAL DATA:  Short of breath.  Difficulty swelling.  EXAM: PORTABLE CHEST - 1 VIEW  COMPARISON:  10/08/2013 and 10/01/2013  FINDINGS: There are irregularly thickened interstitial markings bilaterally suggesting interstitial edema superimposed on chronic interstitial thickening. There are no areas of focal lung consolidation. No pleural effusion or pneumothorax is seen.  Cardiac silhouette is top-normal in size. No convincing mediastinal or hilar masses.  IMPRESSION: Irregular interstitial thickening increased from the prior exam suggesting interstitial edema superimposed on chronic interstitial thickening.   Electronically Signed   By: Lajean Manes M.D.   On: 10/11/2013 09:38   Dg Chest Port 1 View  10/08/2013   CLINICAL DATA:  Aspiration pneumonia  EXAM: PORTABLE CHEST - 1 VIEW  COMPARISON:  October 07, 2013  FINDINGS: Mild cardiac enlargement. Mild vascular congestion. Right lung is clear. Mild hazy opacity left base suggests small effusion, stable. No focal consolidation.  IMPRESSION: Vascular congestion with stable small left effusion.   Electronically Signed   By: Skipper Cliche M.D.   On: 10/08/2013 07:18   Dg Chest Port 1 View  10/07/2013   CLINICAL DATA:  Possible aspiration.  EXAM: PORTABLE CHEST - 1 VIEW  COMPARISON:  Chest 10/01/2013.  FINDINGS: The patient is rotated on this study. There is interstitial pulmonary edema. Left basilar atelectasis is noted. There may be a small left effusion. No focal process on the  right is seen. There is no pneumothorax.  IMPRESSION: Negative for aspiration.  Interstitial edema and small left effusion.   Electronically Signed   By: Inge Rise M.D.   On: 10/07/2013 14:17   Dg Radiologist Eval And Mgmt  09/30/2013   EXAM: NEW PATIENT OFFICE VISIT - LEVEL II [id number])  MEDICATIONS AND MEDICAL HISTORY: Past Medical History:  Diabetes, hypertension, hypercholesterolemia, chronic renal insufficiency, anemia, coronary artery disease, GE reflux. Previous lumbar surgery 2010, knee replacement surgery x2. Dialysis shunt, has not used for dialysis yet.  Medications: amitriptyline (ELAVIL) 10 MG tablet Calcium Citrate-Vitamin D (CALCIUM CITRATE + PO) CINNAMON PO doxycycline (VIBRAMYCIN) 100 MG capsule fish oil-omega-3 fatty acids 1000 MG capsule furosemide (LASIX) 40 MG tablet Garlic POWD Glucosamine-Chondroitin 250-200 MG CAPS insulin NPH-insulin regular (HUMULIN 70/30) (70-30) 100 UNIT/ML injection lisinopril (PRINIVIL,ZESTRIL) 5 MG tablet metoprolol succinate (TOPROL-XL) 25 MG 24 hr tablet Misc Natural Products (OSTEO BI-FLEX TRIPLE STRENGTH PO) nateglinide (STARLIX) 60 MG tablet oxyCODONE (ROXICODONE) 5 MG immediate release tablet sodium bicarbonate 650 MG tablet  Allergies: No known.  No allergy to latex or iodinated contrast.  Social History: Widowed, Lives with her daughter, who accompanied her to the visit. No tobacco or alcohol use.  Family History: CVA in father and mother. Diabetes in maternal grandfather. Heart disease in father.  HISTORY OF PRESENT ILLNESS: Pleasant 80year old female describes development of low lumbar pain in late December without any obvious inciting event. Pain is experienced centrally in the lower lumbar spine. No radicular component. No new bowel or bladder control issues. She has a history previous lumbar surgery at L4-5 ; this does not represent recurrence of the same symptoms. Symptoms are moderately severe, exacerbated by standing, lifting, and bending, relief when recumbent. She had been on oxycodone which lowered the pain to 6 out of 10 on the visual analog scale. She scores 20 out of 24 on the [employer] disability questionnaire. Previously, she could ambulate independently without assistance. Her pain result sitting in her no longer being able to drive. She no longer can sleep comfortably in the bed  but has learned to sleep in a chair fairly comfortably. Pain medication was recently changed to a Dilaudid and she has not started this new narcotic pain medication regimen.  CHIEF COMPLAINT: Back pain  PHYSICAL EXAMINATION: Examined sitting. Normal mood and affect. Blood pressure 166/48, pulse 70, respirations 18, afebrile, O2 sats 96% on room air. Tenderness over lower lumbar spine.  Imaging:  MR lumbar spine from 09/24/2013 significant for L2 compression fracture deformity with approximately 50% loss of height, mild posterior retropulsion. There is diffusely abnormal marrow signal, nonspecific.  REVIEW OF SYSTEMS: Negative for myocardial infarction, stroke, or bleeding diathesis. Positive for numbness.  ASSESSMENT AND PLAN: My impression is that this pleasant patient has a non- healed L2 compression fracture deformity, likely etiology of her continued lower lumbar pain. She has yet to start a trial of her recently prescribed narcotic pain medication to see how her symptoms may respond to this. If pain persists at an unacceptable level or should she not tolerate the meds, I think she would anatomically be an appropriate candidate for kyphoplasty/ vertebroplasty based on review of the MRI. I discussed with the patient and her daughter the procedures, anticipated benefits, limitations, possible risks and complications, and alternatives. They seemed to understand, their questions were answered, and they were motivated to proceed. Accordingly, we will check back with her on Monday to see how she has been doing on her new  Dilaudid pain medication. If needed, we can proceed with treatment at the L2 level at their convenience.  I appreciate your referral of this very pleasant patient. We will keep you updated with her progress.   Electronically Signed   By: Arne Cleveland M.D.   On: 09/30/2013 11:59   Ir Kypho Vertebral Lumbar Augmentation  10/07/2013   CLINICAL DATA:  L2 compression fracture  EXAM: L2 KYPHOPLASTY   MEDICATIONS AND MEDICAL HISTORY: Versed 4 mg, Fentanyl 250 mcg.  As antibiotic prophylaxis, Ancef was ordered pre-procedure and administered intravenously within one hour of incision.  ANESTHESIA/SEDATION: Moderate sedation time: 40 minutes  CONTRAST:  None  FLUOROSCOPY TIME:  5 min and 42 seconds.  PROCEDURE: The procedure, risks, benefits, and alternatives were explained to the patient. Questions regarding the procedure were encouraged and answered. The patient understands and consents to the procedure.  The back was prepped with Betadine in a sterile fashion, and a sterile drape was applied covering the operative field. A sterile gown and sterile gloves were used for the procedure.  Under fluoroscopic guidance, a 10 gauge needle was inserted into the L2 vertebral body via right transpedicular approach. A 14 gauge core biopsy was obtained. A drill was utilized to create a tract to the anterior cortex. A 1.5 cm balloon was advanced into the vertebral body and insufflated at the midline. The balloon was removed and contrast was utilized to fill the cavity with interdigitation. The needle was removed.  FINDINGS: Images demonstrate needle and balloon placement in the L2 vertebral body via a right transpedicular approach. The final image demonstrates cement filling the vertebral body, crossing the midline, and extending from the inferior to superior end plate. No extra osseous cement.  IMPRESSION: Successful L2 KYPHOPLASTY.   Electronically Signed   By: Maryclare Bean M.D.   On: 10/07/2013 08:54    Microbiology: Recent Results (from the past 240 hour(s))  MRSA PCR SCREENING     Status: Abnormal   Collection Time    10/07/13  3:08 PM      Result Value Range Status   MRSA by PCR POSITIVE (*) NEGATIVE Final   Comment:            The GeneXpert MRSA Assay (FDA     approved for NASAL specimens     only), is one component of a     comprehensive MRSA colonization     surveillance program. It is not     intended to  diagnose MRSA     infection nor to guide or     monitor treatment for     MRSA infections.     RESULT CALLED TO, READ BACK BY AND VERIFIED WITH:     E. STANFIELD RN 17:00 10/07/13 ([email or web address])  CLOSTRIDIUM DIFFICILE BY PCR     Status: None   Collection Time    10/11/13  3:23 PM      Result Value Range Status   C difficile by pcr NEGATIVE  NEGATIVE Final     Labs: Basic Metabolic Panel:  Recent Labs Lab 10/08/13 0540  10/11/13 0358 10/11/13 1800 10/12/13 0610 10/13/13 0320 10/14/13 0357  NA 144  < > 145 145 148* 147 146  K 4.3  < > 4.2 4.0 3.7 3.9 3.8  CL 109  < > 107 104 107 103 102  CO2 22  < > 27 25 26 27 31   GLUCOSE 207*  < > 129* 215* 110* 232* 261*  BUN 53*  < >  45* 43* 42* 42* 42*  CREATININE 2.74*  < > 2.86* 2.62* 2.64* 2.45* 2.44*  CALCIUM 7.9*  < > 8.6 8.7 8.9 9.0 8.9  PHOS 3.9  --   --  2.6 3.3 3.6 2.6  < > = values in this interval not displayed. Liver Function Tests:  Recent Labs Lab 10/08/13 0540 10/11/13 1800 10/12/13 0610 10/13/13 0320 10/14/13 0357  ALBUMIN 2.4* 2.7* 2.6* 2.7* 2.5*   No results found for this basename: LIPASE, AMYLASE,  in the last 168 hours No results found for this basename: AMMONIA,  in the last 168 hours CBC:  Recent Labs Lab 10/08/13 0540 10/09/13 0250 10/10/13 0350 10/11/13 0358 10/12/13 0611  WBC 8.3 8.3 8.8 9.4 10.4  NEUTROABS 6.0  --   --   --   --   HGB 8.9* 8.5* 9.4* 9.1* 9.4*  HCT 29.6* 27.9* 30.8* 29.7* 30.3*  MCV 103.1* 102.6* 102.3* 101.4* 100.7*  PLT 260 251 247 253 278   Cardiac Enzymes:  Recent Labs Lab 10/07/13 1600 10/09/13 1134 10/12/13 0830  TROPONINI 0.36* 0.31* 0.38*   BNP: BNP (last 3 results)  Recent Labs  10/09/13 1134 10/12/13 0611  PROBNP 22117.0* 31197.0*   CBG:  Recent Labs Lab 10/13/13 0640 10/13/13 1147 10/13/13 1700 10/13/13 2049 10/14/13 0602  GLUCAP 204* 304* 292* 336* 201*       Signed:  Jarika Robben  [facility] 10/14/2013, 10:36 AM

## 2013-10-14 NOTE — Progress Notes (Signed)
[name]   Subjective:   Pt with minimal SOB today, no other complaints    Objective:   BP 110/72  Pulse 76  Temp(Src) 97.8 F (36.6 C) (Oral)  Resp 20  Ht 5\' 6"  (1.676 m)  Wt 196 lb 3.4 oz (89 kg)  BMI 31.68 kg/m2  SpO2 100%  Intake/Output Summary (Last 24 hours) at 10/14/13 0626 Last data filed at 10/14/13 9485  Gross per 24 hour  Intake   1105 ml  Output   1875 ml  Net   -770 ml   Weight change: 1 lb 8.2 oz (0.685 kg)  Physical Exam: Gen: NAD today   HEENT: [place]/AT, MMM  Neck: No JVD evident  Cardio: RRR, 2/6 SEM holosystolic RUSB  Lungs: no increased WOB, Rales Bibasilar minimal  Abdomen: Soft, NT/ND, NABS, no CVA tenderness  Extremities: Trace pedal edema B/L   Neuro: AAO x 3, no focal deficits, no asterixis   Imaging: No results found.  Labs: BMET  Recent Labs Lab 10/08/13 0540 10/09/13 0250 10/10/13 0350 10/11/13 0358 10/11/13 1800 10/12/13 0610 10/13/13 0320 10/14/13 0357  NA 144 145 144 145 145 148* 147 146  K 4.3 4.0 4.2 4.2 4.0 3.7 3.9 3.8  CL 109 107 106 107 104 107 103 102  CO2 22 23 25 27 25 26 27 31   GLUCOSE 207* 82 133* 129* 215* 110* 232* 261*  BUN 53* 50* 49* 45* 43* 42* 42* 42*  CREATININE 2.74* 2.87* 3.01* 2.86* 2.62* 2.64* 2.45* 2.44*  CALCIUM 7.9* 8.1* 8.3* 8.6 8.7 8.9 9.0 8.9  PHOS 3.9  --   --   --  2.6 3.3 3.6 2.6   CBC  Recent Labs Lab 10/08/13 0540 10/09/13 0250 10/10/13 0350 10/11/13 0358 10/12/13 0611  WBC 8.3 8.3 8.8 9.4 10.4  NEUTROABS 6.0  --   --   --   --   HGB 8.9* 8.5* 9.4* 9.1* 9.4*  HCT 29.6* 27.9* 30.8* 29.7* 30.3*  MCV 103.1* 102.6* 102.3* 101.4* 100.7*  PLT 260 251 247 253 278    Medications:    . amitriptyline  10 mg Oral QHS  . amLODipine  10 mg Oral Daily  . amoxicillin-clavulanate  500 mg Oral Q12H  . aspirin EC  81 mg Oral Daily  . atorvastatin  20 mg Oral q1800  . calcitRIOL  0.25 mcg Oral Daily  . carvedilol  6.25 mg Oral BID WC  . febuxostat  40 mg  Oral QHS  . furosemide  160 mg Oral TID  . insulin aspart  0-15 Units Subcutaneous TID WC  . insulin detemir  16 Units Subcutaneous QHS  . lanthanum  500 mg Oral TID WC  . pantoprazole  40 mg Oral Q1200  . sodium bicarbonate  650 mg Oral BID  . traMADol  50 mg Oral Q4H  . vitamin B-12  1,000 mcg Oral Daily      Assessment/ Plan:   [name] [name] is an 80 y.o. female with history of DM, HTN, CKD stage 4, who sustained a fall 10/01/13 and laid on the floor for about 8 hrs.   1) CKD stage IV - Pt may be at baseline, no renal damage secondary to rhabdo.  AVF in R arm placed around 9-10 months ago per pt. Pt would undergo dialysis when the time is necessary. Pt on Bicarb and Fosrenol in the outpatient setting.  UA improved and Renal US does not show obstructive process.  PTH 528.9  Cr maintained despite diuesis.  Still wet 2) Fluid overload secondary to iatrogenic causes - Baseline weight 192, weight at 194 today.  Diuresed 2.2 L yesterday and BNP elevated to 31,000.  Echo 1/15 showing EF 65-70% and grade 1 diastolic dysnfxn. 3) Diabetes Mellitus - SSI and Levemir  4) UTI - Completed Rocephin  5) Kyphoplasty s/p L2 compression fx with acute pain - Per primary  6) HTN - Per cardiology, increased Toprol 50 mg daily and d/c Cardizem.  7) Anemia, Macrocytic vs anemia of renal disease - B12 and Folate nml, Fe 30, sat % 15 8) Dysphagia - Normal EGD on 1/21  Plan:  1) Continue on Calcitriol, continue on outpatient binders and bicarb  2) Fe studies low, continue with feraheme/aranesp q week 3) Recommend Lasix 160 mg tid with f/u on 2/18 with Dr. Posey Pronto  4) Pt to SNF today, renal will sign off   Nolon Rod, DO PGY-2, [facility] 10/14/2013, 8:12 AM I have seen and examined this patient and agree with the plan of care seen, eval, examined and discussed.  .  [name] 10/14/2013, 12:02 PM

## 2013-10-14 NOTE — Care Management Note (Addendum)
  Page 1 of 1   10/14/2013     4:17:13 PM   CARE MANAGEMENT NOTE 10/14/2013  Patient:  [place], MOSKOWITZ   Account Number:  [id number]  Date Initiated:  10/03/2013  Documentation initiated by:  Jasmine Pang  Subjective/Objective Assessment:   Noted adm s/p fall, pt previously scheduled for surgery on Friday 10/07/2013.     Action/Plan:   CM will follow for progression and d/c planning.   Anticipated DC Date:  10/10/2013   Anticipated DC Plan:  SKILLED NURSING FACILITY  In-house referral  Clinical Social Worker         Choice offered to / List presented to:             Status of service:  Completed, signed off Medicare Important Message given?   (If response is "NO", the following Medicare IM given date fields will be blank) Date Medicare IM given:   Date Additional Medicare IM given:    Discharge Disposition:  [place]  Per UR Regulation:  Reviewed for med. necessity/level of care/duration of stay  If discussed at [place] of Stay Meetings, dates discussed:   10/06/2013    Comments:  10/14/2013 Disposition:  SNF [facility]. Mariann Laster RN, BSN, [place], CCM [phone] Unit) [phone] 10/13/2013   10/07/2013  [place], [place] [phone]  L2 kyphoplasty performed 10/06/2013

## 2013-10-14 NOTE — Progress Notes (Signed)
NUTRITION FOLLOW UP  Intervention:   Continue Glucerna Shakes BID Provide snack once daily Provide Multivitamin with minerals daily  Nutrition Dx:   Inadequate oral intake related to swallowing difficulty and decreased appetite as evidenced by meal completion <50% of most meals; ongoing  Goal:   Pt to meet >/= 90% of their estimated nutrition needs; not met  Monitor:   PO intake; < 50% of meals, no supplements Swallow test results; rec dysphagia 1, thin liquids Weight trends; 8 lb wt loss in 4 days- pt still 6 lbs above reported usual body weight Labs; elevated glucose, high BUN, high creatinine, low GFR  Assessment:   80 y.o. female with history of DM, HTN, CKD [stage 3-4], who sustained a fall 10/01/13 and laid on the floor for 7-10 hours. She was admitted on 10/02/13 via [facility] with acute on chronic renal failure due to rhabdomyolysis as well as severe hypoglycemia.   Pt states that her appetite continues to be poor and she continues to eat <50% of most meals. Pt states she has not received any Glucerna Shakes-RD to reorder. Per MD note, pt is still having tightness when she swallows.   Height: Ht Readings from Last 1 Encounters:  10/09/13 5' 6" (1.676 m)    Weight Status:   Wt Readings from Last 1 Encounters:  10/14/13 196 lb 3.4 oz (89 kg)    Re-estimated needs:  Kcal: 1600-1800  Protein: 80-90 grams  Fluid: 1.6-1.8 L/day  Skin: non-pitting RLE and LLE edema; puncture on back  Diet Order: Dysphagia 1, thin   Intake/Output Summary (Last 24 hours) at 10/14/13 1024 Last data filed at 10/14/13 0841  Gross per 24 hour  Intake    985 ml  Output   1725 ml  Net   -740 ml    Last BM: 1/22   Labs:   Recent Labs Lab 10/12/13 0610 10/13/13 0320 10/14/13 0357  NA 148* 147 146  K 3.7 3.9 3.8  CL 107 103 102  CO2 26 27 31  BUN 42* 42* 42*  CREATININE 2.64* 2.45* 2.44*  CALCIUM 8.9 9.0 8.9  PHOS 3.3 3.6 2.6  GLUCOSE 110* 232* 261*    CBG (last 3)   Recent  Labs  10/13/13 1700 10/13/13 2049 10/14/13 0602  GLUCAP 292* 336* 201*    Scheduled Meds: . amitriptyline  10 mg Oral QHS  . amLODipine  10 mg Oral Daily  . amoxicillin-clavulanate  500 mg Oral Q12H  . aspirin EC  81 mg Oral Daily  . atorvastatin  20 mg Oral q1800  . calcitRIOL  0.25 mcg Oral Daily  . carvedilol  6.25 mg Oral BID WC  . febuxostat  40 mg Oral QHS  . feeding supplement (GLUCERNA SHAKE)  237 mL Oral BID BM  . furosemide  160 mg Oral TID  . insulin aspart  0-15 Units Subcutaneous TID WC  . insulin detemir  16 Units Subcutaneous QHS  . lanthanum  500 mg Oral TID WC  . pantoprazole  40 mg Oral Q1200  . sodium bicarbonate  650 mg Oral BID  . traMADol  50 mg Oral Q4H  . vitamin B-12  1,000 mcg Oral Daily    Continuous Infusions:   Reanne Barnett RD, LDN Inpatient Clinical Dietitian Pager: [phone] After Hours Pager: [phone]  

## 2013-10-15 NOTE — Clinical Social Work Placement (Addendum)
    Clinical Social Work Department CLINICAL SOCIAL WORK PLACEMENT NOTE 10/15/2013  Patient:  [name] [name], [name] [name]  Account Number:  [id number] Admit date:  10/01/2013  Clinical Social Worker:  Crawford Givens, LCSW  Date/time:  10/07/2013 02:08 AM  Clinical Social Work is seeking post-discharge placement for this patient at the following level of care:   SKILLED NURSING   (*CSW will update this form in Epic as items are completed)   10/07/2013  Patient/family provided with [facility] Department of Clinical Social Work's list of facilities offering this level of care within the geographic area requested by the patient (or if unable, by the patient's family).  10/07/2013  Patient/family informed of their freedom to choose among providers that offer the needed level of care, that participate in Medicare, Medicaid or managed care program needed by the patient, have an available bed and are willing to accept the patient.  10/07/2013  Patient/family informed of [facility]' ownership interest in [facility], as well as of the fact that they are under no obligation to receive care at this facility.  PASARR submitted to EDS on  PASARR number received from [place] on   FL2 transmitted to all facilities in geographic area requested by pt/family on  10/07/2013 FL2 transmitted to all facilities within larger geographic area on   Patient informed that his/her managed care company has contracts with or will negotiate with  certain facilities, including the following:   Has existing PASARR number     Patient/family informed of bed offers received:  10/13/2013 Patient chooses bed at [facility] recommends and patient chooses bed at    Patient to be transferred to OTHER on  10/14/2013 Patient to be transferred to facility by Ambulance  Corey Harold)  The following physician request were entered in Epic:   Additional Comments: 10/14/13  OK per MD for d/c today to [facility] in  [place].  Patient and daughter had hoped for placement at [facility] but there were no beds available. [facility] is second choice and they were pleased with this arrangement. Nursing notified to call report.  No further CSW needs identified. CSW signing off.  Lorie Phenix. [place], [place]

## 2013-10-15 NOTE — Progress Notes (Signed)
Patient referred to this CSW as a transferred patient. SNF placement has been initiated. CSW spoke to daughter and patient and bed search is in place. Their first choice is [facility] as patient has been there before as a patient.  2nd choice is [facility].  CSW also discussed possibility of CIR placement if deemed appropriate and they have a bed. Patient and daughter are also interested in this option.  CSW will monitor and provide support.  Lorie Phenix. [place], [place]

## 2013-11-07 ENCOUNTER — Inpatient Hospital Stay (HOSPITAL_COMMUNITY)
Admission: EM | Admit: 2013-11-07 | Discharge: 2013-11-11 | DRG: 682 | Disposition: A | Discharge status: Skilled Nursing Facility | Payer: Medicare Other | Source: Other Acute Inpatient Hospital | Attending: Family Medicine | Admitting: Family Medicine

## 2013-11-07 ENCOUNTER — Encounter (HOSPITAL_COMMUNITY): Payer: Self-pay | Admitting: *Deleted

## 2013-11-07 ENCOUNTER — Inpatient Hospital Stay (HOSPITAL_COMMUNITY): Payer: Medicare Other

## 2013-11-07 DIAGNOSIS — E440 Moderate protein-calorie malnutrition: Secondary | ICD-10-CM

## 2013-11-07 DIAGNOSIS — Z96659 Presence of unspecified artificial knee joint: Secondary | ICD-10-CM

## 2013-11-07 DIAGNOSIS — D539 Nutritional anemia, unspecified: Secondary | ICD-10-CM

## 2013-11-07 DIAGNOSIS — N259 Disorder resulting from impaired renal tubular function, unspecified: Secondary | ICD-10-CM

## 2013-11-07 DIAGNOSIS — N184 Chronic kidney disease, stage 4 (severe): Secondary | ICD-10-CM

## 2013-11-07 DIAGNOSIS — E875 Hyperkalemia: Secondary | ICD-10-CM

## 2013-11-07 DIAGNOSIS — E7800 Pure hypercholesterolemia, unspecified: Secondary | ICD-10-CM

## 2013-11-07 DIAGNOSIS — Z992 Dependence on renal dialysis: Secondary | ICD-10-CM

## 2013-11-07 DIAGNOSIS — J9600 Acute respiratory failure, unspecified whether with hypoxia or hypercapnia: Secondary | ICD-10-CM | POA: Diagnosis present

## 2013-11-07 DIAGNOSIS — M6282 Rhabdomyolysis: Secondary | ICD-10-CM

## 2013-11-07 DIAGNOSIS — S32020A Wedge compression fracture of second lumbar vertebra, initial encounter for closed fracture: Secondary | ICD-10-CM | POA: Diagnosis present

## 2013-11-07 DIAGNOSIS — E1129 Type 2 diabetes mellitus with other diabetic kidney complication: Secondary | ICD-10-CM | POA: Diagnosis present

## 2013-11-07 DIAGNOSIS — I10 Essential (primary) hypertension: Secondary | ICD-10-CM

## 2013-11-07 DIAGNOSIS — R1319 Other dysphagia: Secondary | ICD-10-CM | POA: Diagnosis present

## 2013-11-07 DIAGNOSIS — E162 Hypoglycemia, unspecified: Secondary | ICD-10-CM

## 2013-11-07 DIAGNOSIS — I421 Obstructive hypertrophic cardiomyopathy: Secondary | ICD-10-CM | POA: Diagnosis present

## 2013-11-07 DIAGNOSIS — IMO0002 Reserved for concepts with insufficient information to code with codable children: Secondary | ICD-10-CM | POA: Diagnosis present

## 2013-11-07 DIAGNOSIS — J810 Acute pulmonary edema: Secondary | ICD-10-CM | POA: Diagnosis present

## 2013-11-07 DIAGNOSIS — I2510 Atherosclerotic heart disease of native coronary artery without angina pectoris: Secondary | ICD-10-CM | POA: Diagnosis present

## 2013-11-07 DIAGNOSIS — R9431 Abnormal electrocardiogram [ECG] [EKG]: Secondary | ICD-10-CM

## 2013-11-07 DIAGNOSIS — M549 Dorsalgia, unspecified: Secondary | ICD-10-CM | POA: Diagnosis present

## 2013-11-07 DIAGNOSIS — D638 Anemia in other chronic diseases classified elsewhere: Secondary | ICD-10-CM | POA: Diagnosis present

## 2013-11-07 DIAGNOSIS — N189 Chronic kidney disease, unspecified: Secondary | ICD-10-CM

## 2013-11-07 DIAGNOSIS — Z79899 Other long term (current) drug therapy: Secondary | ICD-10-CM

## 2013-11-07 DIAGNOSIS — N390 Urinary tract infection, site not specified: Secondary | ICD-10-CM

## 2013-11-07 DIAGNOSIS — G8929 Other chronic pain: Secondary | ICD-10-CM | POA: Diagnosis present

## 2013-11-07 DIAGNOSIS — R279 Unspecified lack of coordination: Secondary | ICD-10-CM | POA: Diagnosis present

## 2013-11-07 DIAGNOSIS — R778 Other specified abnormalities of plasma proteins: Secondary | ICD-10-CM

## 2013-11-07 DIAGNOSIS — I120 Hypertensive chronic kidney disease with stage 5 chronic kidney disease or end stage renal disease: Secondary | ICD-10-CM | POA: Diagnosis present

## 2013-11-07 DIAGNOSIS — R1310 Dysphagia, unspecified: Secondary | ICD-10-CM

## 2013-11-07 DIAGNOSIS — N186 End stage renal disease: Secondary | ICD-10-CM | POA: Diagnosis present

## 2013-11-07 DIAGNOSIS — N179 Acute kidney failure, unspecified: Principal | ICD-10-CM | POA: Diagnosis present

## 2013-11-07 DIAGNOSIS — Z7982 Long term (current) use of aspirin: Secondary | ICD-10-CM

## 2013-11-07 DIAGNOSIS — J690 Pneumonitis due to inhalation of food and vomit: Secondary | ICD-10-CM

## 2013-11-07 DIAGNOSIS — R011 Cardiac murmur, unspecified: Secondary | ICD-10-CM

## 2013-11-07 DIAGNOSIS — E8770 Fluid overload, unspecified: Secondary | ICD-10-CM

## 2013-11-07 DIAGNOSIS — E119 Type 2 diabetes mellitus without complications: Secondary | ICD-10-CM

## 2013-11-07 DIAGNOSIS — Z8249 Family history of ischemic heart disease and other diseases of the circulatory system: Secondary | ICD-10-CM

## 2013-11-07 DIAGNOSIS — I5031 Acute diastolic (congestive) heart failure: Secondary | ICD-10-CM

## 2013-11-07 DIAGNOSIS — I2789 Other specified pulmonary heart diseases: Secondary | ICD-10-CM | POA: Diagnosis present

## 2013-11-07 DIAGNOSIS — E669 Obesity, unspecified: Secondary | ICD-10-CM

## 2013-11-07 DIAGNOSIS — R7989 Other specified abnormal findings of blood chemistry: Secondary | ICD-10-CM

## 2013-11-07 DIAGNOSIS — E1165 Type 2 diabetes mellitus with hyperglycemia: Secondary | ICD-10-CM | POA: Diagnosis present

## 2013-11-07 DIAGNOSIS — K219 Gastro-esophageal reflux disease without esophagitis: Secondary | ICD-10-CM | POA: Diagnosis present

## 2013-11-07 DIAGNOSIS — Z66 Do not resuscitate: Secondary | ICD-10-CM | POA: Diagnosis present

## 2013-11-07 DIAGNOSIS — Z794 Long term (current) use of insulin: Secondary | ICD-10-CM

## 2013-11-07 DIAGNOSIS — I509 Heart failure, unspecified: Secondary | ICD-10-CM

## 2013-11-07 LAB — CBC
HCT: 25.4 % — ABNORMAL LOW (ref 36.0–46.0)
HEMOGLOBIN: 8.4 g/dL — AB (ref 12.0–15.0)
MCH: 31.5 pg (ref 26.0–34.0)
MCHC: 33.1 g/dL (ref 30.0–36.0)
MCV: 95.1 fL (ref 78.0–100.0)
Platelets: 214 10*3/uL (ref 150–400)
RBC: 2.67 MIL/uL — ABNORMAL LOW (ref 3.87–5.11)
RDW: 13.6 % (ref 11.5–15.5)
WBC: 9.4 10*3/uL (ref 4.0–10.5)

## 2013-11-07 LAB — COMPREHENSIVE METABOLIC PANEL
ALBUMIN: 3 g/dL — AB (ref 3.5–5.2)
ALT: 7 U/L (ref 0–35)
AST: 16 U/L (ref 0–37)
Alkaline Phosphatase: 66 U/L (ref 39–117)
BUN: 109 mg/dL — ABNORMAL HIGH (ref 6–23)
CALCIUM: 8.3 mg/dL — AB (ref 8.4–10.5)
CO2: 30 mEq/L (ref 19–32)
Chloride: 82 mEq/L — ABNORMAL LOW (ref 96–112)
Creatinine, Ser: 12.9 mg/dL — ABNORMAL HIGH (ref 0.50–1.10)
GFR calc non Af Amer: 2 mL/min — ABNORMAL LOW (ref >90)
GFR, EST AFRICAN AMERICAN: 3 mL/min — AB (ref >90)
GLUCOSE: 134 mg/dL — AB (ref 70–99)
Potassium: 4.1 mEq/L (ref 3.7–5.3)
SODIUM: 136 meq/L — AB (ref 137–147)
Total Bilirubin: 0.4 mg/dL (ref 0.3–1.2)
Total Protein: 6.7 g/dL (ref 6.0–8.3)

## 2013-11-07 LAB — BASIC METABOLIC PANEL
BUN: 110 mg/dL — AB (ref 6–23)
CO2: 29 meq/L (ref 19–32)
CREATININE: 12.71 mg/dL — AB (ref 0.50–1.10)
Calcium: 8.4 mg/dL (ref 8.4–10.5)
Chloride: 84 mEq/L — ABNORMAL LOW (ref 96–112)
GFR calc non Af Amer: 2 mL/min — ABNORMAL LOW (ref >90)
GFR, EST AFRICAN AMERICAN: 3 mL/min — AB (ref >90)
Glucose, Bld: 91 mg/dL (ref 70–99)
Potassium: 4 mEq/L (ref 3.7–5.3)
Sodium: 137 mEq/L (ref 137–147)

## 2013-11-07 LAB — HEMOGLOBIN A1C
HEMOGLOBIN A1C: 6.3 % — AB (ref <5.7)
Mean Plasma Glucose: 134 mg/dL — ABNORMAL HIGH (ref <117)

## 2013-11-07 LAB — GLUCOSE, CAPILLARY
GLUCOSE-CAPILLARY: 121 mg/dL — AB (ref 70–99)
Glucose-Capillary: 99 mg/dL (ref 70–99)

## 2013-11-07 LAB — MRSA PCR SCREENING: MRSA BY PCR: NEGATIVE

## 2013-11-07 LAB — IRON AND TIBC
Iron: 62 ug/dL (ref 42–135)
SATURATION RATIOS: 29 % (ref 20–55)
TIBC: 212 ug/dL — AB (ref 250–470)
UIBC: 150 ug/dL (ref 125–400)

## 2013-11-07 MED ORDER — SODIUM BICARBONATE 650 MG PO TABS
650.0000 mg | ORAL_TABLET | Freq: Every day | ORAL | Status: DC
  Administered 2013-11-07 – 2013-11-11 (×5): 650 mg via ORAL
  Filled 2013-11-07 (×5): qty 1

## 2013-11-07 MED ORDER — INSULIN DETEMIR 100 UNIT/ML ~~LOC~~ SOLN
8.0000 [IU] | Freq: Every day | SUBCUTANEOUS | Status: DC
  Administered 2013-11-07 – 2013-11-09 (×3): 8 [IU] via SUBCUTANEOUS
  Filled 2013-11-07 (×4): qty 0.08

## 2013-11-07 MED ORDER — PANTOPRAZOLE SODIUM 40 MG PO TBEC
40.0000 mg | DELAYED_RELEASE_TABLET | Freq: Every day | ORAL | Status: DC
  Administered 2013-11-08 – 2013-11-11 (×4): 40 mg via ORAL
  Filled 2013-11-07 (×4): qty 1

## 2013-11-07 MED ORDER — HYDROMORPHONE HCL PF 1 MG/ML IJ SOLN: 1.0000 mg | INTRAMUSCULAR | Status: DC | PRN

## 2013-11-07 MED ORDER — ACETAMINOPHEN 650 MG RE SUPP: 650.0000 mg | Freq: Four times a day (QID) | RECTAL | Status: DC | PRN

## 2013-11-07 MED ORDER — HYDROMORPHONE HCL 2 MG PO TABS: 1.0000 mg | ORAL_TABLET | Freq: Four times a day (QID) | ORAL | Status: DC | PRN

## 2013-11-07 MED ORDER — ENOXAPARIN SODIUM 30 MG/0.3ML ~~LOC~~ SOLN
30.0000 mg | SUBCUTANEOUS | Status: DC
  Administered 2013-11-07 – 2013-11-11 (×5): 30 mg via SUBCUTANEOUS
  Filled 2013-11-07 (×5): qty 0.3

## 2013-11-07 MED ORDER — INSULIN ASPART 100 UNIT/ML ~~LOC~~ SOLN
0.0000 [IU] | Freq: Every day | SUBCUTANEOUS | Status: DC
Start: 2013-11-07 — End: 2013-11-11

## 2013-11-07 MED ORDER — PENTAFLUOROPROP-TETRAFLUOROETH EX AERO: 1.0000 "application " | INHALATION_SPRAY | CUTANEOUS | Status: DC | PRN

## 2013-11-07 MED ORDER — OMEGA-3-ACID ETHYL ESTERS 1 G PO CAPS
1.0000 g | ORAL_CAPSULE | Freq: Every day | ORAL | Status: DC
  Administered 2013-11-07 – 2013-11-11 (×5): 1 g via ORAL
  Filled 2013-11-07 (×5): qty 1

## 2013-11-07 MED ORDER — SODIUM CHLORIDE 0.9 % IJ SOLN
3.0000 mL | Freq: Two times a day (BID) | INTRAMUSCULAR | Status: DC
  Administered 2013-11-07 – 2013-11-11 (×9): 3 mL via INTRAVENOUS

## 2013-11-07 MED ORDER — FEBUXOSTAT 40 MG PO TABS
40.0000 mg | ORAL_TABLET | Freq: Every day | ORAL | Status: DC
  Administered 2013-11-07 – 2013-11-10 (×4): 40 mg via ORAL
  Filled 2013-11-07 (×5): qty 1

## 2013-11-07 MED ORDER — BIOTENE DRY MOUTH MT LIQD
15.0000 mL | Freq: Two times a day (BID) | OROMUCOSAL | Status: DC
  Administered 2013-11-08 – 2013-11-10 (×4): 15 mL via OROMUCOSAL

## 2013-11-07 MED ORDER — ALTEPLASE 2 MG IJ SOLR
2.0000 mg | Freq: Once | INTRAMUSCULAR | Status: AC | PRN
  Filled 2013-11-07: qty 2

## 2013-11-07 MED ORDER — ONDANSETRON HCL 4 MG PO TABS: 4.0000 mg | ORAL_TABLET | Freq: Four times a day (QID) | ORAL | Status: DC | PRN

## 2013-11-07 MED ORDER — INSULIN ASPART 100 UNIT/ML ~~LOC~~ SOLN
0.0000 [IU] | Freq: Three times a day (TID) | SUBCUTANEOUS | Status: DC
  Administered 2013-11-07 – 2013-11-11 (×3): 1 [IU] via SUBCUTANEOUS

## 2013-11-07 MED ORDER — AMITRIPTYLINE HCL 10 MG PO TABS
10.0000 mg | ORAL_TABLET | Freq: Every day | ORAL | Status: DC
  Administered 2013-11-07 – 2013-11-10 (×4): 10 mg via ORAL
  Filled 2013-11-07 (×5): qty 1

## 2013-11-07 MED ORDER — SODIUM CHLORIDE 0.9 % IV SOLN: 100.0000 mL | INTRAVENOUS | Status: DC | PRN

## 2013-11-07 MED ORDER — ASPIRIN EC 81 MG PO TBEC
81.0000 mg | DELAYED_RELEASE_TABLET | Freq: Every day | ORAL | Status: DC
  Administered 2013-11-07 – 2013-11-11 (×5): 81 mg via ORAL
  Filled 2013-11-07 (×5): qty 1

## 2013-11-07 MED ORDER — LIDOCAINE HCL (PF) 1 % IJ SOLN: 5.0000 mL | INTRAMUSCULAR | Status: DC | PRN

## 2013-11-07 MED ORDER — GLUCERNA SHAKE PO LIQD
237.0000 mL | Freq: Two times a day (BID) | ORAL | Status: DC
  Administered 2013-11-08 – 2013-11-09 (×2): 237 mL via ORAL

## 2013-11-07 MED ORDER — PHENOL 1.4 % MT LIQD: 1.0000 | Freq: Three times a day (TID) | OROMUCOSAL | Status: DC | PRN

## 2013-11-07 MED ORDER — LIDOCAINE-PRILOCAINE 2.5-2.5 % EX CREA
1.0000 "application " | TOPICAL_CREAM | CUTANEOUS | Status: DC | PRN
  Filled 2013-11-07: qty 5

## 2013-11-07 MED ORDER — TRAMADOL HCL 50 MG PO TABS
50.0000 mg | ORAL_TABLET | Freq: Four times a day (QID) | ORAL | Status: DC | PRN
  Administered 2013-11-08: 50 mg via ORAL
  Filled 2013-11-07: qty 1

## 2013-11-07 MED ORDER — ONDANSETRON HCL 4 MG/2ML IJ SOLN: 4.0000 mg | Freq: Four times a day (QID) | INTRAMUSCULAR | Status: DC | PRN

## 2013-11-07 MED ORDER — ACETAMINOPHEN 325 MG PO TABS
650.0000 mg | ORAL_TABLET | Freq: Four times a day (QID) | ORAL | Status: DC | PRN
  Filled 2013-11-07: qty 2

## 2013-11-07 MED ORDER — DIAZEPAM 5 MG PO TABS
5.0000 mg | ORAL_TABLET | Freq: Two times a day (BID) | ORAL | Status: DC | PRN
  Administered 2013-11-08 – 2013-11-09 (×3): 5 mg via ORAL
  Filled 2013-11-07 (×3): qty 1

## 2013-11-07 MED ORDER — LANTHANUM CARBONATE 500 MG PO CHEW
500.0000 mg | CHEWABLE_TABLET | Freq: Three times a day (TID) | ORAL | Status: DC
  Administered 2013-11-07 – 2013-11-11 (×12): 500 mg via ORAL
  Filled 2013-11-07 (×15): qty 1

## 2013-11-07 MED ORDER — VITAMIN B-12 1000 MCG PO TABS
1000.0000 ug | ORAL_TABLET | Freq: Every day | ORAL | Status: DC
  Administered 2013-11-07 – 2013-11-11 (×5): 1000 ug via ORAL
  Filled 2013-11-07 (×5): qty 1

## 2013-11-07 MED ORDER — HEPARIN SODIUM (PORCINE) 1000 UNIT/ML DIALYSIS
1000.0000 [IU] | INTRAMUSCULAR | Status: DC | PRN
  Filled 2013-11-07: qty 1

## 2013-11-07 MED ORDER — CHLORHEXIDINE GLUCONATE 0.12 % MT SOLN
15.0000 mL | Freq: Two times a day (BID) | OROMUCOSAL | Status: DC
  Administered 2013-11-08 – 2013-11-11 (×6): 15 mL via OROMUCOSAL
  Filled 2013-11-07 (×10): qty 15

## 2013-11-07 MED ORDER — INSULIN DETEMIR 100 UNIT/ML ~~LOC~~ SOLN: 16.0000 [IU] | Freq: Every day | SUBCUTANEOUS | Status: DC

## 2013-11-07 MED ORDER — HEPARIN SODIUM (PORCINE) 1000 UNIT/ML DIALYSIS
20.0000 [IU]/kg | INTRAMUSCULAR | Status: DC | PRN
  Filled 2013-11-07: qty 2

## 2013-11-07 MED ORDER — CALCITRIOL 0.25 MCG PO CAPS
0.2500 ug | ORAL_CAPSULE | Freq: Every day | ORAL | Status: DC
  Administered 2013-11-07 – 2013-11-11 (×5): 0.25 ug via ORAL
  Filled 2013-11-07 (×5): qty 1

## 2013-11-07 MED ORDER — ATORVASTATIN CALCIUM 20 MG PO TABS
20.0000 mg | ORAL_TABLET | Freq: Every day | ORAL | Status: DC
  Administered 2013-11-07 – 2013-11-11 (×5): 20 mg via ORAL
  Filled 2013-11-07 (×5): qty 1

## 2013-11-07 MED ORDER — OMEGA-3 FATTY ACIDS 1000 MG PO CAPS: 1.0000 g | ORAL_CAPSULE | Freq: Every day | ORAL | Status: DC

## 2013-11-07 MED ORDER — NEPRO/CARBSTEADY PO LIQD
237.0000 mL | ORAL | Status: DC | PRN
  Filled 2013-11-07: qty 237

## 2013-11-07 NOTE — Progress Notes (Signed)
Clinical Social Work Department BRIEF PSYCHOSOCIAL ASSESSMENT 11/07/2013  Patient:  [name] [name], [name] [name]     Account Number:  [id number]     Admit date:  11/07/2013  Clinical Social Worker:  Megan Salon  Date/Time:  11/07/2013 03:37 PM  Referred by:  RN  Date Referred:  11/07/2013 Referred for  SNF Placement   Other Referral:   Interview type:  Family Other interview type:   CSW spoke to patient's son by bedside and patient.    PSYCHOSOCIAL DATA Living Status:  FACILITY Admitted from facility:  OTHER Level of care:  [place] Primary support name:  Katheran Awe Primary support relationship to patient:  CHILD, ADULT Degree of support available:   Good    CURRENT CONCERNS Current Concerns  Post-Acute Placement   Other Concerns:    SOCIAL WORK ASSESSMENT / PLAN Per RN, patient is from a SNF. CSW went into room and introduced self to patient and patient's son by bedside. Patient advised CSW that the patient is from [place] and will most likely return at dc. Patient's son confirmed this as well and advised CSW to speak to patient's daughter about the dc plan. CSW left voicemail with patient's daughter.   Assessment/plan status:  Psychosocial Support/Ongoing Assessment of Needs Other assessment/ plan:   Information/referral to community resources:   SNF information/CSW information    PATIENT'S/FAMILY'S RESPONSE TO PLAN OF CARE: Patient's son states he is hopeful that patient will be able to return home eventually after rehab. Patient's son advised CSW to speak to patient's daughter about the dc plan.       Jeanette Caprice, MSW, [place]

## 2013-11-07 NOTE — H&P (Signed)
History and Physical       Hospital Admission Note Date: 11/07/2013  Patient name: [name] [name] Medical record number: [id number] Date of birth: [birth year] Age: 80 y.o. Gender: female  PCP: Ann Held, MD  Primary nephrologist: Dr. Graylon Gunning  Chief Complaint:   transferred from [facility] for acute renal insufficiency on chronic kidney disease, uremia  HPI: Patient is 80 year old female with history of chronic kidney disease, stage IV, hypertension, hyperlipidemia, diabetes mellitus, chronic back pain L2 fracture status post recent kyphoplasty 1/15, was recently discharged to SNF on 1/23. History was obtained from the daughter reported that she had visited her in the SNF yesterday when she found her slightly confused having nausea and dry heaving. She did report to the nursing staff about her symptoms. This morning at 3 AM she received a call that patient was being transferred to [facility] ER due to difficulty breathing and congestion. Patient denies any chest pain, fevers or chills, any nausea vomiting at this time. She hasn't had urine sample since AM but denies any acute urinary retention. In the [facility] ER patient was noted to have BUN of 108 and creatinine of 2.6, baseline creatinine 4.0. BNP 28,000. Patient was somewhat hypoxic on 3 L O2 via nasal cannula, at baseline she's not on any O2.  Review of Systems:  Constitutional: Denies fever, chills, diaphoresis, poor appetite and fatigue.  HEENT: Denies photophobia, eye pain, redness, hearing loss, ear pain, congestion, sore throat, rhinorrhea, sneezing, mouth sores, trouble swallowing, neck pain, neck stiffness and tinnitus.   Respiratory: Denies chest tightness,  and wheezing. + Shortness of breath  Cardiovascular: Denies chest pain, palpitations and leg swelling.  Gastrointestinal: Please see history of present illness  Genitourinary: Denies dysuria,  urgency, frequency, hematuria, flank pain and difficulty urinating.  Musculoskeletal: Denies myalgias,+ chronic back pain, joint swelling, arthralgias and gait problem.  Skin: Denies pallor, rash and wound.  Neurological: Denies dizziness, seizures, syncope, weakness, light-headedness, numbness and headaches.  Hematological: Denies adenopathy. Easy bruising, personal or family bleeding history  Psychiatric/Behavioral: Denies suicidal ideation, mood changes, confusion, nervousness, sleep disturbance and agitation  Past Medical History: Past Medical History  Diagnosis Date  . Diabetes mellitus     mellitus? x25 years  . HTN (hypertension)     x2 years  . Hypercholesterolemia     x 2 years  . Renal insufficiency     mild to moderate  . Anemia     chronic anemia due to chronic disease  . CAD (coronary artery disease)     nonobstructive   . GERD (gastroesophageal reflux disease)    Past Surgical History  Procedure Laterality Date  . Total knee arthroplasty      L-2006; R-2008   . Appendectomy    . Back surgery      lower back  . Transthoracic echocardiogram  05/2011  . Av fistula placement  07/07/2012    Procedure: ARTERIOVENOUS (AV) FISTULA CREATION;  Surgeon: Mal Misty, MD;  Location: [facility] OR;  Service: Vascular;  Laterality: Right;  Brachial - cephalic arterivenous fistula  . Esophagogastroduodenoscopy (egd) with esophageal dilation N/A 10/12/2013    Procedure: ESOPHAGOGASTRODUODENOSCOPY (EGD) WITH ESOPHAGEAL DILATION;  Surgeon: [place] Dragon, MD;  Location: [facility] ENDOSCOPY;  Service: Endoscopy;  Laterality: N/A;  . Savory dilation N/A 10/12/2013    Procedure: SAVORY DILATION;  Surgeon: [place] Dragon, MD;  Location: [facility] ENDOSCOPY;  Service: Endoscopy;  Laterality: N/A;    Medications: Prior to Admission medications  Medication Sig Start Date End Date Taking? Authorizing Provider  amitriptyline (ELAVIL) 10 MG tablet Take 10 mg by mouth at bedtime.     Historical Provider, MD   amLODipine (NORVASC) 10 MG tablet Take 1 tablet (10 mg total) by mouth daily. 10/14/13   Nishant Dhungel, MD  aspirin EC 81 MG tablet Take 81 mg by mouth daily.    Historical Provider, MD  atorvastatin (LIPITOR) 20 MG tablet Take 20 mg by mouth daily.    Historical Provider, MD  calcitRIOL (ROCALTROL) 0.25 MCG capsule Take 1 capsule (0.25 mcg total) by mouth daily. 10/14/13   Nishant Dhungel, MD  Calcium-Magnesium-Vitamin D (CALCIUM 500 PO) Take 500 mg by mouth daily.    Historical Provider, MD  carvedilol (COREG) 6.25 MG tablet Take 1 tablet (6.25 mg total) by mouth 2 (two) times daily with a meal. 10/14/13   Nishant Dhungel, MD  CINNAMON PO Take 2 tablets by mouth daily.    Historical Provider, MD  diazepam (VALIUM) 5 MG tablet Take 5 mg by mouth every 12 (twelve) hours as needed for anxiety.    Historical Provider, MD  febuxostat (ULORIC) 40 MG tablet Take 40 mg by mouth at bedtime.    Historical Provider, MD  feeding supplement, GLUCERNA SHAKE, (GLUCERNA SHAKE) LIQD Take 237 mLs by mouth 2 (two) times daily between meals. 10/14/13   Nishant Dhungel, MD  fish oil-omega-3 fatty acids 1000 MG capsule Take 1 g by mouth daily.     Historical Provider, MD  furosemide (LASIX) 80 MG tablet Take 2 tablets (160 mg total) by mouth 2 (two) times daily. 10/14/13   Nishant Dhungel, MD  GARLIC PO Take 1 tablet by mouth daily.    Historical Provider, MD  Glucosamine-Chondroitin 250-200 MG CAPS Take 1 capsule by mouth daily.      Historical Provider, MD  HYDROmorphone (DILAUDID) 2 MG tablet Take 0.5 tablets (1 mg total) by mouth every 6 (six) hours as needed for severe pain. 10/14/13   Nishant Dhungel, MD  insulin detemir (LEVEMIR) 100 UNIT/ML injection Inject 0.16 mLs (16 Units total) into the skin at bedtime. 10/14/13   Nishant Dhungel, MD  lanthanum (FOSRENOL) 500 MG chewable tablet Chew 500 mg by mouth 3 (three) times daily with meals.    Historical Provider, MD  metolazone (ZAROXOLYN) 2.5 MG tablet Take 1 tablet  (2.5 mg total) by mouth daily. 10/14/13   Nishant Dhungel, MD  metoprolol succinate (TOPROL-XL) 50 MG 24 hr tablet Take 1 tablet (50 mg total) by mouth daily. 10/14/13   Nishant Dhungel, MD  Misc Natural Products (OSTEO BI-FLEX TRIPLE STRENGTH PO) Take 1 tablet by mouth daily.    Historical Provider, MD  nateglinide (STARLIX) 60 MG tablet Take 60 mg by mouth every morning. Takes prior to large meal    Historical Provider, MD  pantoprazole (PROTONIX) 40 MG tablet Take 1 tablet (40 mg total) by mouth daily at 12 noon. 10/14/13   Nishant Dhungel, MD  phenol (CHLORASEPTIC) 1.4 % LIQD Use as directed 1 spray in the mouth or throat 3 (three) times daily as needed for throat irritation / pain. 10/14/13   Nishant Dhungel, MD  sodium bicarbonate 650 MG tablet Take 1 tablet (650 mg total) by mouth daily. 10/14/13   Nishant Dhungel, MD  traMADol (ULTRAM) 50 MG tablet Take 1 tablet (50 mg total) by mouth every 6 (six) hours as needed. 10/14/13   Nishant Dhungel, MD  vitamin B-12 (CYANOCOBALAMIN) 1000 MCG tablet Take 1,000 mcg by mouth  daily.    Historical Provider, MD    Allergies:  No Known Allergies  Social History:  reports that she has never smoked. She has never used smokeless tobacco. She reports that she does not drink alcohol or use illicit drugs. currently resident of skilled nursing facility  Family History: Family History  Problem Relation Age of Onset  . Heart attack Father     Physical Exam: Blood pressure 105/53, pulse 54, temperature 97.7 F (36.5 C), temperature source Oral, resp. rate 18, weight 85.6 kg (188 lb 11.4 oz), SpO2 90.00%. General: Alert, awake, oriented x3, in no acute distress. HEENT: normocephalic, atraumatic, anicteric sclera, pink conjunctiva, pupils equal and reactive to light and accomodation, oropharynx clear Neck: supple, no masses or lymphadenopathy, no goiter, no bruits  Heart: Regular rate and rhythm, without murmurs, rubs or gallops. Lungs: Decreased breath sounds  through out, no rhonchi  Abdomen: Soft, nontender, nondistended, positive bowel sounds, no masses. Extremities: No clubbing, cyanosis, 1+ edema with positive pedal pulses. Neuro: Grossly intact, no focal neurological deficits, strength 5/5 upper and lower extremities bilaterally Psych: alert and oriented x 3, normal mood and affect Skin: no rashes or lesions, warm and dry   LABS on Admission:  Basic Metabolic Panel: No results found for this basename: NA, K, CL, CO2, GLUCOSE, BUN, CREATININE, CALCIUM, MG, PHOS,  in the last 168 hours Liver Function Tests: No results found for this basename: AST, ALT, ALKPHOS, BILITOT, PROT, ALBUMIN,  in the last 168 hours No results found for this basename: LIPASE, AMYLASE,  in the last 168 hours No results found for this basename: AMMONIA,  in the last 168 hours CBC: No results found for this basename: WBC, NEUTROABS, HGB, HCT, MCV, PLT,  in the last 168 hours Cardiac Enzymes: No results found for this basename: CKTOTAL, CKMB, CKMBINDEX, TROPONINI,  in the last 168 hours BNP: No components found with this basename: POCBNP,  CBG: No results found for this basename: GLUCAP,  in the last 168 hours   Radiological Exams on Admission: Dg Esophagus  10/10/2013   CLINICAL DATA:  Dysphagia  EXAM: ESOPHOGRAM/BARIUM SWALLOW  TECHNIQUE: Single contrast examination was performed using  thin barium.  COMPARISON:  None.  FLUOROSCOPY TIME:  49 seconds  FINDINGS: Moderate esophageal dysmotility with numerous non-peristaltic/tertiary contractions.  No definite fixed esophageal narrowing or stricture. However, a barium tablet was not administered since the patient could not stand upright.  Small hiatal hernia.  Gastroesophageal reflux could not be assessed due to the patient's inability to clear her esophagus.  IMPRESSION: Moderate esophageal dysmotility.  No definite fixed esophageal narrowing or stricture.  Small hiatal hernia.   Electronically Signed   By: Julian Hy M.D.   On: 10/10/2013 14:38   US Renal  10/11/2013   CLINICAL DATA:  Chronic kidney disease  EXAM: RENAL/URINARY TRACT ULTRASOUND COMPLETE  COMPARISON:  US RENAL dated 06/07/2012; US RENAL dated 02/17/2012  FINDINGS: Right Kidney: 9.9 cm. Increased echogenicity and decreased cortical thickness. No hydronephrosis.  Left Kidney: 9.3 cm. Increased echogenicity and decreased cortical thickness. No hydronephrosis.  Bladder:  Collapsed around a Foley catheter.  IMPRESSION: 1.  No hydronephrosis. 2. Findings of medical renal disease, including mild cortical thinning and increased echogenicity.   Electronically Signed   By: Abigail Miyamoto M.D.   On: 10/11/2013 19:32   Dg Chest Port 1 View  10/11/2013   CLINICAL DATA:  Respiratory distress and question fluid overload.  EXAM: PORTABLE CHEST - 1 VIEW  COMPARISON:  10/11/2013  FINDINGS: Again noted are diffuse interstitial lung densities suggesting edema. Heart size appears to be enlarged. Minimal change from the previous examination. Bibasilar densities are also concerning for pleural effusions.  IMPRESSION: Findings are most compatible with pulmonary edema and pleural effusions.  Heart size is prominent for size.   Electronically Signed   By: Markus Daft M.D.   On: 10/11/2013 20:02   Dg Chest Port 1 View  10/11/2013   CLINICAL DATA:  Short of breath.  Difficulty swelling.  EXAM: PORTABLE CHEST - 1 VIEW  COMPARISON:  10/08/2013 and 10/01/2013  FINDINGS: There are irregularly thickened interstitial markings bilaterally suggesting interstitial edema superimposed on chronic interstitial thickening. There are no areas of focal lung consolidation. No pleural effusion or pneumothorax is seen.  Cardiac silhouette is top-normal in size. No convincing mediastinal or hilar masses.  IMPRESSION: Irregular interstitial thickening increased from the prior exam suggesting interstitial edema superimposed on chronic interstitial thickening.   Electronically Signed   By: Lajean Manes M.D.   On: 10/11/2013 09:38   [facility] BMET showed sodium 134 potassium 4.0 BUN 108 creatinine 12.6 troponin 0.2 BNP 28,000 LFTs are normal Hb 8.6, Hct 27.3, platelets 185, WBCs 8.2  Assessment/Plan Principal Problem:   Acute pulmonary edema/acute respiratory failure with hypoxia in the setting of Acute on chronic renal failure : Baseline creatinine 2.4 on 10/14/13 - Patient has a history of chronic kidney disease stage IV, AVF in right arm, placed 10 months ago per patient's daughter but had not started hemodialysis yet. Patient presented with uremic symptoms and acute pulmonary edema, may likely need hemodialysis at this time. Discussed with Dr Arty Baumgartner who will evaluate patient for further management. - Discussed in detail with patient and her family members in the room regarding possibility of hemodialysis, patient wants to pursue HD if needed.  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 She underwent EGD and was unremarkable and was recommended to continue PPI. - She was recommended dysphagia 1 diet     Compression fracture of L2 lumbar vertebra - Patient underwent kyphoplasty in 1/15, continue PT, OT, rehab, pain control  DVT prophylaxis: Lovenox  CODE STATUS: DO NOT RESUSCITATE  Family Communication: Admission, patients condition and plan of care including tests being ordered have been discussed  with the patient and family members (daughter and son in room) who indicates understanding and agree with the plan and Code Status   Further plan will depend as patient's clinical course evolves and further radiologic and laboratory data become available.   Time Spent on Admission: 70 mins  Marge Vandermeulen M.D. [facility] 11/07/2013, 8:20 AM Pager: [phone]  If 7PM-7AM, please contact night-coverage [email or web address] Password [email or web address]

## 2013-11-07 NOTE — Progress Notes (Signed)
Patient refusing to eat DYS 1 (pureed) diet.  Eating food provided by family.  MD notified.  Jillyn Ledger, MBA, BS, RN

## 2013-11-07 NOTE — Progress Notes (Signed)
INITIAL NUTRITION ASSESSMENT  DOCUMENTATION CODES Per approved criteria  -Obesity Unspecified   INTERVENTION:  Continue Glucerna Shake po BID, each supplement provides 220 kcal and 10 grams of protein RD to follow for nutrition care plan  NUTRITION DIAGNOSIS: Inadequate oral intake related to pureed texture aversion as evidenced by patient/son report  Goal: Pt to meet >/= 90% of their estimated nutrition needs   Monitor:  PO & supplemental intake, weight, labs, I/O's  Reason for Assessment: Malnutrition Screening Tool Report  80 y.o. female  Admitting Dx: Acute on chronic renal failure  ASSESSMENT: Patient with PMH of CKD, HTN, HLD, DM and chronic back pain s/p kyphoplasty 10/06/13; found to be slightly confused, nauseous and dry heaving; transferred from [facility] for acute renal insufficiency and uremia.  RD spoke with patient and patient's son at bedside; patient reports her appetite isn't very good; currently on a Dys 1-thin liquid diet; per son at [facility] facility patient receives regular textured food with known aspiration risk; no % PO intake records available; Glucerna Shake supplements in place  Telephone with readback order received per Dr. Tana Coast to change diet to Renal, Carbohydrate Modified.  Height: Ht Readings from Last 1 Encounters:  11/07/13 5\' 1"  (1.549 m)    Weight: Wt Readings from Last 1 Encounters:  11/07/13 188 lb 11.4 oz (85.6 kg)    Ideal Body Weight: 105 lb  % Ideal Body Weight: 179%  Wt Readings from Last 10 Encounters:  11/07/13 188 lb 11.4 oz (85.6 kg)  10/14/13 196 lb 3.4 oz (89 kg)  10/14/13 196 lb 3.4 oz (89 kg)  08/17/12 184 lb (83.462 kg)  07/06/12 184 lb (83.462 kg)  05/27/11 200 lb (90.719 kg)  03/20/09 199 lb (90.266 kg)    Usual Body Weight: 196 lb  % Usual Body Weight: 95%  BMI:  Body mass index is 35.68 kg/(m^2).  Estimated Nutritional Needs: Kcal: 1600-1800 Protein: 75-85 gm Fluid: 1.6-1.8 L  Skin:  Intact  Diet Order: Dysphagia 1, thin liquids  EDUCATION NEEDS: -No education needs identified at this time  Labs:   Recent Labs Lab 11/07/13 1040  NA 137  K 4.0  CL 84*  CO2 29  BUN 110*  CREATININE 12.71*  CALCIUM 8.4  GLUCOSE 91    CBG (last 3)   Recent Labs  11/07/13 1125  GLUCAP 99    Scheduled Meds: . amitriptyline  10 mg Oral QHS  . aspirin EC  81 mg Oral Daily  . atorvastatin  20 mg Oral Daily  . calcitRIOL  0.25 mcg Oral Daily  . enoxaparin (LOVENOX) injection  30 mg Subcutaneous Q24H  . febuxostat  40 mg Oral QHS  . feeding supplement (GLUCERNA SHAKE)  237 mL Oral BID BM  . insulin aspart  0-5 Units Subcutaneous QHS  . insulin aspart  0-9 Units Subcutaneous TID WC  . insulin detemir  8 Units Subcutaneous QHS  . lanthanum  500 mg Oral TID WC  . omega-3 acid ethyl esters  1 g Oral Daily  . pantoprazole  40 mg Oral Q1200  . sodium bicarbonate  650 mg Oral Daily  . sodium chloride  3 mL Intravenous Q12H  . vitamin B-12  1,000 mcg Oral Daily    Continuous Infusions:   Past Medical History  Diagnosis Date  . Diabetes mellitus     mellitus? x25 years  . HTN (hypertension)     x2 years  . Hypercholesterolemia     x 2 years  .  Renal insufficiency     mild to moderate  . Anemia     chronic anemia due to chronic disease  . CAD (coronary artery disease)     nonobstructive   . GERD (gastroesophageal reflux disease)     Past Surgical History  Procedure Laterality Date  . Total knee arthroplasty      L-2006; R-2008   . Appendectomy    . Back surgery      lower back  . Transthoracic echocardiogram  05/2011  . Av fistula placement  07/07/2012    Procedure: ARTERIOVENOUS (AV) FISTULA CREATION;  Surgeon: Mal Misty, MD;  Location[facility] OR;  Service: Vascular;  Laterality: Right;  Brachial - cephalic arterivenous fistula  . Esophagogastroduodenoscopy (egd) with esophageal dilation N/A 10/12/2013    Procedure: ESOPHAGOGASTRODUODENOSCOPY (EGD) WITH  ESOPHAGEAL DILATION;  Surgeon: [place] Dragon, MD;  Location: [facility] ENDOSCOPY;  Service: Endoscopy;  Laterality: N/A;  . Savory dilation N/A 10/12/2013    Procedure: SAVORY DILATION;  Surgeon: [place] Dragon, MD;  Location: [facility] ENDOSCOPY;  Service: Endoscopy;  Laterality: N/A;    Arthur Holms, RD, LDN Pager #: [phone] After-Hours Pager #: [phone]

## 2013-11-07 NOTE — Care Management Note (Signed)
    Page 1 of 1   11/11/2013     4:07:58 PM   CARE MANAGEMENT NOTE 11/11/2013  Patient:  [name] [name], [name] [name]   Account Number:  [id number]  Date Initiated:  11/07/2013  Documentation initiated by:  Teruko Joswick  Subjective/Objective Assessment:   PT ADM ON 2/16 WITH ACUTE ON CHRONIC RENAL FAILURE.  PTA, PT RESIDED AT [facility].     Action/Plan:   CSW CONSULTED TO FACILITATE RETURN TO SNF WHEN MEDICALLY STABLE FOR DC.  WILL FOLLOW PROGRESS.   Anticipated DC Date:  11/12/2013   Anticipated DC Plan:  SKILLED NURSING FACILITY  In-house referral  Clinical Social Worker      DC Planning Services  CM consult      Choice offered to / List presented to:             Status of service:  Completed, signed off Medicare Important Message given?   (If response is "NO", the following Medicare IM given date fields will be blank) Date Medicare IM given:   Date Additional Medicare IM given:    Discharge Disposition:  [facility]  Per UR Regulation:  Reviewed for med. necessity/level of care/duration of stay  If discussed at [place] of Stay Meetings, dates discussed:    Comments:  11/11/13 Tyrease Vandeberg,RN,BSN [phone] PT [facility] TODAY, PER CSW ARRANGEMENTS.  OP HEMODIALYSIS HAS BEEN ARRANGED, PER INPT DIALYSIS STAFF.

## 2013-11-07 NOTE — Consult Note (Addendum)
Reason for Consult:AKI/CKD Referring Physician: Tana Coast, MD  [name] [name] is an 80 y.o. female.  HPI: Pt is an 80yo WF with PMH sig for longstanding DM, HTN, and progressive CKD stage 4 (baseline Scr 2.4-2.8), who was s/p kyphoplasty 1/15 and then went to SNF in [place] and was in her usual state of health until yesterday afternoon when her daughter visited and found her to have AMS and appeared be congested/wheezing.  She also stated that her mother was having nausea and dry heaves yesterday but nothing was done for her.  [name] [name] was later sent to [facility] ED with SOB at 3am and then transferred her to [facility] due to her AKI/CKD.  Her trend in Scr is seen below.  Her daughter denies any NSAIDs/COX'II I's, IV contrast.    Trend in Creatinine: Creatinine, Ser  Date/Time Value Ref Range Status  11/07/2013 10:40 AM 12.71* 0.50 - 1.10 mg/dL Final  10/14/2013  3:57 AM 2.44* 0.50 - 1.10 mg/dL Final  10/13/2013  3:20 AM 2.45* 0.50 - 1.10 mg/dL Final  10/12/2013  6:10 AM 2.64* 0.50 - 1.10 mg/dL Final  10/11/2013  6:00 PM 2.62* 0.50 - 1.10 mg/dL Final  10/11/2013  3:58 AM 2.86* 0.50 - 1.10 mg/dL Final  10/10/2013  3:50 AM 3.01* 0.50 - 1.10 mg/dL Final  10/09/2013  2:50 AM 2.87* 0.50 - 1.10 mg/dL Final  10/08/2013  5:40 AM 2.74* 0.50 - 1.10 mg/dL Final  10/07/2013  4:10 AM 2.64* 0.50 - 1.10 mg/dL Final  10/06/2013  3:56 AM 2.79* 0.50 - 1.10 mg/dL Final  10/05/2013  4:00 AM 2.90* 0.50 - 1.10 mg/dL Final  10/04/2013  1:50 PM 2.82* 0.50 - 1.10 mg/dL Final  10/04/2013  5:50 AM 2.78* 0.50 - 1.10 mg/dL Final  10/03/2013  5:20 AM 2.49* 0.50 - 1.10 mg/dL Final  10/02/2013  3:50 AM 2.84* 0.50 - 1.10 mg/dL Final  07/07/2012  9:17 AM 2.90* 0.50 - 1.10 mg/dL Final  04/13/2009  7:50 AM 1.72* 0.4 - 1.2 mg/dL Final  04/11/2009 11:00 AM 2.01* 0.4 - 1.2 mg/dL Final  04/09/2009  2:20 PM 2.29* 0.4 - 1.2 mg/dL Final  12/08/2006  1:26 PM 2.0* 0.4-1.2 mg/dL Final    PMH:   Past Medical History  Diagnosis Date  . Diabetes mellitus      mellitus? x25 years  . HTN (hypertension)     x2 years  . Hypercholesterolemia     x 2 years  . Renal insufficiency     mild to moderate  . Anemia     chronic anemia due to chronic disease  . CAD (coronary artery disease)     nonobstructive   . GERD (gastroesophageal reflux disease)     PSH:   Past Surgical History  Procedure Laterality Date  . Total knee arthroplasty      L-2006; R-2008   . Appendectomy    . Back surgery      lower back  . Transthoracic echocardiogram  05/2011  . Av fistula placement  07/07/2012    Procedure: ARTERIOVENOUS (AV) FISTULA CREATION;  Surgeon: Mal Misty, MD;  Location: [facility] OR;  Service: Vascular;  Laterality: Right;  Brachial - cephalic arterivenous fistula  . Esophagogastroduodenoscopy (egd) with esophageal dilation N/A 10/12/2013    Procedure: ESOPHAGOGASTRODUODENOSCOPY (EGD) WITH ESOPHAGEAL DILATION;  Surgeon: Lafayette Dragon, MD;  Location: [facility] ENDOSCOPY;  Service: Endoscopy;  Laterality: N/A;  . Savory dilation N/A 10/12/2013    Procedure: SAVORY DILATION;  Surgeon: Lafayette Dragon, MD;  Location: [facility] ENDOSCOPY;  Service: Endoscopy;  Laterality: N/A;    Allergies: No Known Allergies  Medications:   Prior to Admission medications   Medication Sig Start Date End Date Taking? Authorizing Provider  amitriptyline (ELAVIL) 10 MG tablet Take 10 mg by mouth at bedtime.    Yes Historical Provider, MD  amLODipine (NORVASC) 10 MG tablet Take 1 tablet (10 mg total) by mouth daily. 10/14/13  Yes Nishant Dhungel, MD  aspirin EC 81 MG tablet Take 81 mg by mouth daily.   Yes Historical Provider, MD  calcitRIOL (ROCALTROL) 0.25 MCG capsule Take 1 capsule (0.25 mcg total) by mouth daily. 10/14/13  Yes Nishant Dhungel, MD  Calcium-Magnesium-Vitamin D (CALCIUM 500 PO) Take 500 mg by mouth daily.   Yes Historical Provider, MD  carvedilol (COREG) 6.25 MG tablet Take 1 tablet (6.25 mg total) by mouth 2 (two) times daily with a meal. 10/14/13  Yes Nishant Dhungel, MD   cholecalciferol (VITAMIN D) 1000 UNITS tablet Take 1,000 Units by mouth daily.   Yes Historical Provider, MD  CINNAMON PO Take 2 tablets by mouth daily.   Yes Historical Provider, MD  cyanocobalamin (,VITAMIN B-12,) 1000 MCG/ML injection Inject 1,000 mcg into the muscle every 30 (thirty) days.   Yes Historical Provider, MD  febuxostat (ULORIC) 40 MG tablet Take 40 mg by mouth at bedtime.   Yes Historical Provider, MD  fish oil-omega-3 fatty acids 1000 MG capsule Take 1 g by mouth daily.    Yes Historical Provider, MD  furosemide (LASIX) 80 MG tablet Take 2 tablets (160 mg total) by mouth 2 (two) times daily. 10/14/13  Yes Nishant Dhungel, MD  GARLIC PO Take [id number] mg by mouth daily.    Yes Historical Provider, MD  Glucosamine-Chondroitin 250-200 MG CAPS Take 1 capsule by mouth daily.     Yes Historical Provider, MD  insulin detemir (LEVEMIR) 100 UNIT/ML injection Inject 12 Units into the skin at bedtime.   Yes Historical Provider, MD  lansoprazole (PREVACID) 30 MG capsule Take 30 mg by mouth daily at 12 noon.   Yes Historical Provider, MD  lanthanum (FOSRENOL) 500 MG chewable tablet Chew 500 mg by mouth 3 (three) times daily with meals.   Yes Historical Provider, MD  metolazone (ZAROXOLYN) 2.5 MG tablet Take 1 tablet (2.5 mg total) by mouth daily. 10/14/13  Yes Nishant Dhungel, MD  metoprolol succinate (TOPROL-XL) 50 MG 24 hr tablet Take 1 tablet (50 mg total) by mouth daily. 10/14/13  Yes Nishant Dhungel, MD  Misc Natural Products (OSTEO BI-FLEX TRIPLE STRENGTH PO) Take 1 tablet by mouth daily.   Yes Historical Provider, MD  nateglinide (STARLIX) 60 MG tablet Take 60 mg by mouth every morning. Takes prior to large meal   Yes Historical Provider, MD  polyethylene glycol (MIRALAX / GLYCOLAX) packet Take 17 g by mouth at bedtime.   Yes Historical Provider, MD  pravastatin (PRAVACHOL) 80 MG tablet Take 80 mg by mouth at bedtime.   Yes Historical Provider, MD  promethazine (PHENERGAN) 25 MG tablet Take 25  mg by mouth every 4 (four) hours as needed for nausea. Maximum of 4 doses in 24 hours   Yes Historical Provider, MD  sodium bicarbonate 650 MG tablet Take 1 tablet (650 mg total) by mouth daily. 10/14/13  Yes Nishant Dhungel, MD    Inpatient medications: . amitriptyline  10 mg Oral QHS  . antiseptic oral rinse  15 mL Mouth Rinse q12n4p  . aspirin EC  81 mg Oral Daily  .  atorvastatin  20 mg Oral Daily  . calcitRIOL  0.25 mcg Oral Daily  . chlorhexidine  15 mL Mouth Rinse BID  . enoxaparin (LOVENOX) injection  30 mg Subcutaneous Q24H  . febuxostat  40 mg Oral QHS  . feeding supplement (GLUCERNA SHAKE)  237 mL Oral BID BM  . insulin aspart  0-5 Units Subcutaneous QHS  . insulin aspart  0-9 Units Subcutaneous TID WC  . insulin detemir  8 Units Subcutaneous QHS  . lanthanum  500 mg Oral TID WC  . omega-3 acid ethyl esters  1 g Oral Daily  . pantoprazole  40 mg Oral Q1200  . sodium bicarbonate  650 mg Oral Daily  . sodium chloride  3 mL Intravenous Q12H  . vitamin B-12  1,000 mcg Oral Daily    Discontinued Meds:   Medications Discontinued During This Encounter  Medication Reason  . insulin detemir (LEVEMIR) injection 16 Units   . fish oil-omega-3 fatty acids capsule 1 g Formulary change  . insulin detemir (LEVEMIR) 100 UNIT/ML injection Discontinued by provider  . HYDROmorphone (DILAUDID) 2 MG tablet Patient has not taken in last 30 days  . diazepam (VALIUM) 5 MG tablet Discontinued by provider  . phenol (CHLORASEPTIC) 1.4 % LIQD Discontinued by provider  . pantoprazole (PROTONIX) 40 MG tablet Change in therapy  . traMADol (ULTRAM) 50 MG tablet Discontinued by provider  . vitamin B-12 (CYANOCOBALAMIN) 1000 MCG tablet Change in therapy  . feeding supplement, [place], ([place]) LIQD Patient has not taken in last 30 days  . atorvastatin (LIPITOR) 20 MG tablet Patient has not taken in last 30 days    Social History:  reports that she has never smoked. She has never used  smokeless tobacco. She reports that she does not drink alcohol or use illicit drugs.  Family History:   Family History  Problem Relation Age of Onset  . Heart attack Father     Pertinent items are noted in HPI. Weight change:  No intake or output data in the 24 hours ending 11/07/13 1533 BP 105/53  Pulse 54  Temp(Src) 97.7 F (36.5 C) (Oral)  Resp 18  Ht 5\' 1"  (1.549 m)  Wt 85.6 kg (188 lb 11.4 oz)  BMI 35.68 kg/m2  SpO2 90% Filed Vitals:   11/07/13 0700 11/07/13 1222  BP: 105/53   Pulse: 54   Temp: 97.7 F (36.5 C)   TempSrc: Oral   Resp: 18   Height:  5\' 1"  (1.549 m)  Weight: 85.6 kg (188 lb 11.4 oz)   SpO2: 90%      General appearance: fatigued, mild distress, moderately obese and slowed mentation Head: Normocephalic, without obvious abnormality, atraumatic Eyes: negative findings: lids and lashes normal, conjunctivae and sclerae normal and corneas clear Neck: no adenopathy, no carotid bruit, no JVD, supple, symmetrical, trachea midline and thyroid not enlarged, symmetric, no tenderness/mass/nodules Resp: rales bilaterally and occ exp wheezes Cardio: regular rate and rhythm, S1, S2 normal, no murmur, click, rub or gallop GI: soft, non-tender; bowel sounds normal; no masses,  no organomegaly Extremities: edema 1+ pretib and RUE AVF +T/B Neuro- +asterixis  Labs: Basic Metabolic Panel:  Recent Labs Lab 11/07/13 1040  NA 137  K 4.0  CL 84*  CO2 29  GLUCOSE 91  BUN 110*  CREATININE 12.71*  CALCIUM 8.4   Liver Function Tests: No results found for this basename: AST, ALT, ALKPHOS, BILITOT, PROT, ALBUMIN,  in the last 168 hours No results found for this basename:  LIPASE, AMYLASE,  in the last 168 hours No results found for this basename: AMMONIA,  in the last 168 hours CBC:  Recent Labs Lab 11/07/13 1040  WBC 9.4  HGB 8.4*  HCT 25.4*  MCV 95.1  PLT 214   PT/INR: @LABRCNTIP (inr:5) Cardiac Enzymes: )No results found for this basename: CKTOTAL, CKMB,  CKMBINDEX, TROPONINI,  in the last 168 hours CBG:  Recent Labs Lab 11/07/13 1125  GLUCAP 99    Iron Studies: No results found for this basename: IRON, TIBC, TRANSFERRIN, FERRITIN,  in the last 168 hours  Xrays/Other Studies: No results found.   Assessment/Plan: 1.  progressive CKD with an abrupt increase in BUN/Scr from unclear etiology.  Although she had advanced CKD at baseline but still marked decline in a short period of time.  Given her volume overload, AMS and asterixis, will plan for HD today and again tomorrow.  Will need to review outpt records for an offending agent.  Possible over diuresis given decreased po intake and ongoing use of diuretics. 2. Pulm edema- ?CHF vs HCAP. Will order CXR 3. ACDz- will follow iron stores and H/H but will need to start ESA 4. DM- per primary svc 5. AMS- as above, uremia but will need to r/o PNA 6. Vascular access- RUE AVF +T/B 7. Esophageal stricture s/p dilation- stable 8. H/o Aspiration- pt's daughter reports that her mother will not eat the pureed food and wants her to have a normal diet.  I told her I would reorder a swallowing study but didn't feel comfortable changing the diet.   Faatimah Spielberg A 11/07/2013, 3:33 PM

## 2013-11-08 DIAGNOSIS — R1319 Other dysphagia: Secondary | ICD-10-CM

## 2013-11-08 DIAGNOSIS — E669 Obesity, unspecified: Secondary | ICD-10-CM

## 2013-11-08 LAB — VITAMIN B12: Vitamin B-12: >2000 pg/mL — ABNORMAL HIGH (ref 211–911)

## 2013-11-08 LAB — BASIC METABOLIC PANEL
BUN: 60 mg/dL — ABNORMAL HIGH (ref 6–23)
CO2: 27 mEq/L (ref 19–32)
Calcium: 8.3 mg/dL — ABNORMAL LOW (ref 8.4–10.5)
Chloride: 91 mEq/L — ABNORMAL LOW (ref 96–112)
Creatinine, Ser: 8.44 mg/dL — ABNORMAL HIGH (ref 0.50–1.10)
GFR calc non Af Amer: 4 mL/min — ABNORMAL LOW (ref >90)
GFR, EST AFRICAN AMERICAN: 4 mL/min — AB (ref >90)
Glucose, Bld: 94 mg/dL (ref 70–99)
POTASSIUM: 3.5 meq/L — AB (ref 3.7–5.3)
SODIUM: 138 meq/L (ref 137–147)

## 2013-11-08 LAB — GLUCOSE, CAPILLARY
GLUCOSE-CAPILLARY: 118 mg/dL — AB (ref 70–99)
GLUCOSE-CAPILLARY: 87 mg/dL (ref 70–99)
GLUCOSE-CAPILLARY: 141 mg/dL — AB (ref 70–99)
Glucose-Capillary: 125 mg/dL — ABNORMAL HIGH (ref 70–99)
Glucose-Capillary: 125 mg/dL — ABNORMAL HIGH (ref 70–99)

## 2013-11-08 LAB — CBC
HCT: 27.7 % — ABNORMAL LOW (ref 36.0–46.0)
HEMOGLOBIN: 8.9 g/dL — AB (ref 12.0–15.0)
MCH: 31.3 pg (ref 26.0–34.0)
MCHC: 32.1 g/dL (ref 30.0–36.0)
MCV: 97.5 fL (ref 78.0–100.0)
Platelets: 221 10*3/uL (ref 150–400)
RBC: 2.84 MIL/uL — AB (ref 3.87–5.11)
RDW: 13.9 % (ref 11.5–15.5)
WBC: 8.6 10*3/uL (ref 4.0–10.5)

## 2013-11-08 LAB — HEPATITIS B SURFACE ANTIBODY,QUALITATIVE: Hep B S Ab: NEGATIVE

## 2013-11-08 LAB — HEPATITIS B CORE ANTIBODY, IGM: Hep B C IgM: NONREACTIVE

## 2013-11-08 LAB — HEMOGLOBIN A1C
HEMOGLOBIN A1C: 6.4 % — AB (ref <5.7)
Mean Plasma Glucose: 137 mg/dL — ABNORMAL HIGH (ref <117)

## 2013-11-08 LAB — HEPATITIS B SURFACE ANTIGEN: HEP B S AG: NEGATIVE

## 2013-11-08 LAB — FOLATE RBC: RBC Folate: 1271 ng/mL — ABNORMAL HIGH (ref >280)

## 2013-11-08 LAB — HEPATITIS B CORE ANTIBODY, TOTAL: Hep B Core Total Ab: NONREACTIVE

## 2013-11-08 LAB — FERRITIN: FERRITIN: 1896 ng/mL — AB (ref 10–291)

## 2013-11-08 MED ORDER — HEPARIN SODIUM (PORCINE) 1000 UNIT/ML DIALYSIS
20.0000 [IU]/kg | INTRAMUSCULAR | Status: DC | PRN
  Administered 2013-11-08: 1700 [IU] via INTRAVENOUS_CENTRAL
  Filled 2013-11-08: qty 2

## 2013-11-08 MED ORDER — CARVEDILOL 3.125 MG PO TABS
3.1250 mg | ORAL_TABLET | Freq: Two times a day (BID) | ORAL | Status: DC
  Administered 2013-11-08 – 2013-11-11 (×6): 3.125 mg via ORAL
  Filled 2013-11-08 (×9): qty 1

## 2013-11-08 NOTE — Progress Notes (Signed)
Speech Pathology  Cancelled Evaluation; Pt in HD.  Will f/u tomorrow.  Herbie Baltimore, [place] CCC-SLP [phone]

## 2013-11-08 NOTE — Progress Notes (Signed)
Pt's family member approached desk and screamed about SCD's keeping patient awake. Stated "if you don't do something about those things on her legs she is not going to get any sleep and she is going to come out of the bed." Pt's family member was very rude to nursing staff. I entered patient's room and asked patient if the SCD's were bothering her. She stated yes so I advised her that we could remove them. I educated patient and family about the reason for the SCD's and family member stated that she understood, but still wanted them removed. Pt's RN Dreama notified of the removal. She was reviewing pt's chart for sleep medication orders.

## 2013-11-08 NOTE — Progress Notes (Signed)
Patient ID: [name] [name], female   DOB: 23-Dec-1925, 80 y.o.   MRN: [id number] S:Pt feels better but was agitated last night. Better today O:BP 103/37  Pulse 65  Temp(Src) 97.4 F (36.3 C) (Axillary)  Resp 20  Ht 5\' 1"  (1.549 m)  Wt 82.9 kg (182 lb 12.2 oz)  BMI 34.55 kg/m2  SpO2 100%  Intake/Output Summary (Last 24 hours) at 11/08/13 0852 Last data filed at 11/07/13 2150  Gross per 24 hour  Intake    100 ml  Output   2217 ml  Net  -2117 ml   Intake/Output: I/O last 3 completed shifts: In: 100 [P.O.:100] Out: 2217 [Urine:550; JJOAC:1660]  Intake/Output this shift:    Weight change: -1.2 kg (-2 lb 10.3 oz) Gen:WD elderly WF in NAd CVS:no rub Resp:bibasilar crackles Abd:+bs, soft, NT Ext:1+ pretib and presacral edema, RUE AVF +T/B   Recent Labs Lab 11/07/13 1040 11/07/13 1825 11/08/13 0356  NA 137 136* 138  K 4.0 4.1 3.5*  CL 84* 82* 91*  CO2 29 30 27   GLUCOSE 91 134* 94  BUN 110* 109* 60*  CREATININE 12.71* 12.90* 8.44*  ALBUMIN  --  3.0*  --   CALCIUM 8.4 8.3* 8.3*  AST  --  16  --   ALT  --  7  --    Liver Function Tests:  Recent Labs Lab 11/07/13 1825  AST 16  ALT 7  ALKPHOS 66  BILITOT 0.4  PROT 6.7  ALBUMIN 3.0*   No results found for this basename: LIPASE, AMYLASE,  in the last 168 hours No results found for this basename: AMMONIA,  in the last 168 hours CBC:  Recent Labs Lab 11/07/13 1040 11/08/13 0356  WBC 9.4 8.6  HGB 8.4* 8.9*  HCT 25.4* 27.7*  MCV 95.1 97.5  PLT 214 221   Cardiac Enzymes: No results found for this basename: CKTOTAL, CKMB, CKMBINDEX, TROPONINI,  in the last 168 hours CBG:  Recent Labs Lab 11/07/13 1125 11/07/13 1647 11/08/13 0638  GLUCAP 99 121* 87    Iron Studies:  Recent Labs  11/07/13 1825  IRON 62  TIBC 212*  FERRITIN 1896*   Studies/Results: Dg Chest 2 View  11/07/2013   CLINICAL DATA:  Productive cough.  EXAM: CHEST  2 VIEW  COMPARISON:  DG CHEST PORTABLE dated 11/07/2013; DG CHEST 1V PORT  dated 10/11/2013; DG CHEST 1V PORT dated 10/11/2013; DG CHEST 1V PORT dated 10/07/2013; DG CHEST 2 VIEW dated 07/07/2012  FINDINGS: Cardiomegaly with mild pulmonary vascular prominence noted. Diffuse interstitial prominence noted. Bilateral pleural effusions are present. These findings are consistent with congestive heart failure. Superimposed pneumonitis cannot be excluded . No pleural effusion or pneumothorax. There are multiple mild thoracic compressions noted. This may be from severe osteopenia present.  IMPRESSION: Findings consistent with congestive heart failure with pulmonary edema. Superimposed pneumonitis cannot be excluded.   Electronically Signed   By: Marcello Moores  Register   On: 11/07/2013 18:20   . amitriptyline  10 mg Oral QHS  . antiseptic oral rinse  15 mL Mouth Rinse q12n4p  . aspirin EC  81 mg Oral Daily  . atorvastatin  20 mg Oral Daily  . calcitRIOL  0.25 mcg Oral Daily  . carvedilol  3.125 mg Oral BID WC  . chlorhexidine  15 mL Mouth Rinse BID  . enoxaparin (LOVENOX) injection  30 mg Subcutaneous Q24H  . febuxostat  40 mg Oral QHS  . feeding supplement (GLUCERNA SHAKE)  237 mL Oral BID  BM  . insulin aspart  0-5 Units Subcutaneous QHS  . insulin aspart  0-9 Units Subcutaneous TID WC  . insulin detemir  8 Units Subcutaneous QHS  . lanthanum  500 mg Oral TID WC  . omega-3 acid ethyl esters  1 g Oral Daily  . pantoprazole  40 mg Oral Q1200  . sodium bicarbonate  650 mg Oral Daily  . sodium chloride  3 mL Intravenous Q12H  . vitamin B-12  1,000 mcg Oral Daily    BMET    Component Value Date/Time   NA 138 11/08/2013 0356   K 3.5* 11/08/2013 0356   CL 91* 11/08/2013 0356   CO2 27 11/08/2013 0356   GLUCOSE 94 11/08/2013 0356   BUN 60* 11/08/2013 0356   CREATININE 8.44* 11/08/2013 0356   CALCIUM 8.3* 11/08/2013 0356   GFRNONAA 4* 11/08/2013 0356   GFRAA 4* 11/08/2013 0356   CBC    Component Value Date/Time   WBC 8.6 11/08/2013 0356   RBC 2.84* 11/08/2013 0356   HGB 8.9* 11/08/2013  0356   HCT 27.7* 11/08/2013 0356   PLT 221 11/08/2013 0356   MCV 97.5 11/08/2013 0356   MCH 31.3 11/08/2013 0356   MCHC 32.1 11/08/2013 0356   RDW 13.9 11/08/2013 0356   LYMPHSABS 1.3 10/08/2013 0540   MONOABS 0.8 10/08/2013 0540   EOSABS 0.2 10/08/2013 0540   BASOSABS 0.0 10/08/2013 0540     Assessment/Plan:  1. progressive CKD with an abrupt increase in BUN/Scr from unclear etiology. Although she had advanced CKD at baseline but still marked decline in a short period of time.  1. Plan for HD again today and start the process for outpt HD in [place]. 2. Cont to UF as tolerated 3. Will give her off tomorrow to rest the new AVF. 2. Pulm edema- improving with HD, cont with UF 3. ACDz-  iron stores fine, will start ESA 4. DM- per primary svc 5. AMS- as above, uremia improved after HD but ?sun-downing?  6. Vascular access- RUE AVF +T/B 7. Esophageal stricture s/p dilation- stable 8. H/o Aspiration- pt's daughter reports that her mother will not eat the pureed food and wants her to have a normal diet. I told her I would reorder a swallowing study but didn't feel comfortable changing the diet. 9. Dispo- back to SNF when outpt HD arranged.   [place] A

## 2013-11-08 NOTE — Clinical Social Work Note (Signed)
Per admission coordinator with [facility], family would have to be willing to transport patient to outpatient HD if patient returns to their facility.   Liz Beach, [place], [place], [phone]

## 2013-11-08 NOTE — Progress Notes (Addendum)
[facility] PROGRESS NOTE  [name] [name] [id number] DOB: Oct 06, 1925 DOA: 11/07/2013 PCP: Ann Held, MD Brief Narrative Patient is 87/F with history of CKD IV, hypertension, hyperlipidemia, diabetes mellitus, chronic back pain L2 fracture status post recent kyphoplasty 1/15, was recently discharged to SNF on 1/23.  Daughter reported that she had visited her in the SNF when she found her slightly confused having nausea and dry heaving, the next day patient was transferred to [facility] ER due to difficulty breathing and congestion. In the [facility] ER patient was noted to have BUN of 108 and creatinine of 12, baseline creatinine 4.0. BNP 28,000. Patient was hypoxic as well. Seen by Renal, Started on HD, volume status improving  Assessment/Plan: 1. ARF on CKD with pulmonary edema -Renal following -started HD yesterday, plan for HD again today  2. DM, type II, uncontrolled with complications, renal nephropathy  - continue Levemir at decreased dose, SSI  3. 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 Chronic dysphagia  - Patient had barium swallow study done during previous admission which showed esophageal dysmotility. She underwent EGD and was unremarkable and was recommended to continue PPI.  - per daughter pt declines dysphagia 1 diet  -speech re-eval requested  5. Compression fracture of L2 lumbar vertebra  - Patient underwent kyphoplasty in 1/15, continue PT, OT, pain control   6. Anemia of chronic disease -epo per Renal with HD  DVT prophylaxis: Lovenox   Code Status: DNR Family Communication: d/w daughter at bedside Disposition Plan: likely back to SNF    HPI/Subjective: Some confusion overnight, denies any complaints  Objective: Filed Vitals:   11/08/13 1545  BP: 108/46  Pulse: 55   Temp:   Resp: 14    Intake/Output Summary (Last 24 hours) at 11/08/13 1559 Last data filed at 11/07/13 2150  Gross per 24 hour  Intake    100 ml  Output   2217 ml  Net  -2117 ml   Filed Weights   11/07/13 1900 11/07/13 2150 11/08/13 1214  Weight: 84.4 kg (186 lb 1.1 oz) 82.9 kg (182 lb 12.2 oz) 83.9 kg (184 lb 15.5 oz)    Exam: General: Alert, awake, oriented x3, in no acute distress.  HEENT: normocephalic, atraumatic, anicteric sclera, pink conjunctiva, pupils equal and reactive to light and accomodation, oropharynx clear  Neck: supple, no masses or lymphadenopathy, no goiter, no bruits  Heart: Regular rate and rhythm, without murmurs, rubs or gallops.  Lungs: Decreased breath sounds through out, no rhonchi  Abdomen: Soft, nontender, nondistended, positive bowel sounds, no masses.  Extremities: No clubbing, cyanosis, 1+ edema with positive pedal pulses.  Neuro: Grossly intact, no focal neurological deficits, strength 5/5 upper and lower extremities bilaterally  Psych: alert and oriented x 3, normal mood and affect  Skin: no rashes or lesions, warm and dry   Data Reviewed: Basic Metabolic Panel:  Recent Labs Lab 11/07/13 1040 11/07/13 1825 11/08/13 0356  NA 137 136* 138  K 4.0 4.1 3.5*  CL 84* 82* 91*  CO2 29 30 27   GLUCOSE 91 134* 94  BUN 110* 109* 60*  CREATININE 12.71* 12.90* 8.44*  CALCIUM 8.4 8.3* 8.3*   Liver Function Tests:  Recent Labs Lab 11/07/13 1825  AST 16  ALT 7  ALKPHOS 66  BILITOT 0.4  PROT 6.7  ALBUMIN 3.0*   No results found for this basename: LIPASE,  AMYLASE,  in the last 168 hours No results found for this basename: AMMONIA,  in the last 168 hours CBC:  Recent Labs Lab 11/07/13 1040 11/08/13 0356  WBC 9.4 8.6  HGB 8.4* 8.9*  HCT 25.4* 27.7*  MCV 95.1 97.5  PLT 214 221   Cardiac Enzymes: No results found for this basename: CKTOTAL, CKMB, CKMBINDEX, TROPONINI,  in the last 168 hours BNP (last 3 results)  Recent Labs   10/09/13 1134 10/12/13 0611  PROBNP 22117.0* 31197.0*   CBG:  Recent Labs Lab 11/07/13 1125 11/07/13 1647 11/07/13 2222 11/08/13 0638 11/08/13 1118  GLUCAP 99 121* 118* 87 141*    Recent Results (from the past 240 hour(s))  MRSA PCR SCREENING     Status: None   Collection Time    11/07/13  8:03 AM      Result Value Ref Range Status   MRSA by PCR NEGATIVE  NEGATIVE Final   Comment:            The GeneXpert MRSA Assay (FDA     approved for NASAL specimens     only), is one component of a     comprehensive MRSA colonization     surveillance program. It is not     intended to diagnose MRSA     infection nor to guide or     monitor treatment for     MRSA infections.     Studies: Dg Chest 2 View  11/07/2013   CLINICAL DATA:  Productive cough.  EXAM: CHEST  2 VIEW  COMPARISON:  DG CHEST PORTABLE dated 11/07/2013; DG CHEST 1V PORT dated 10/11/2013; DG CHEST 1V PORT dated 10/11/2013; DG CHEST 1V PORT dated 10/07/2013; DG CHEST 2 VIEW dated 07/07/2012  FINDINGS: Cardiomegaly with mild pulmonary vascular prominence noted. Diffuse interstitial prominence noted. Bilateral pleural effusions are present. These findings are consistent with congestive heart failure. Superimposed pneumonitis cannot be excluded . No pleural effusion or pneumothorax. There are multiple mild thoracic compressions noted. This may be from severe osteopenia present.  IMPRESSION: Findings consistent with congestive heart failure with pulmonary edema. Superimposed pneumonitis cannot be excluded.   Electronically Signed   By: Marcello Moores  Register   On: 11/07/2013 18:20    Scheduled Meds: . amitriptyline  10 mg Oral QHS  . antiseptic oral rinse  15 mL Mouth Rinse q12n4p  . aspirin EC  81 mg Oral Daily  . atorvastatin  20 mg Oral Daily  . calcitRIOL  0.25 mcg Oral Daily  . carvedilol  3.125 mg Oral BID WC  . chlorhexidine  15 mL Mouth Rinse BID  . enoxaparin (LOVENOX) injection  30 mg Subcutaneous Q24H  . febuxostat  40  mg Oral QHS  . feeding supplement (GLUCERNA SHAKE)  237 mL Oral BID BM  . insulin aspart  0-5 Units Subcutaneous QHS  . insulin aspart  0-9 Units Subcutaneous TID WC  . insulin detemir  8 Units Subcutaneous QHS  . lanthanum  500 mg Oral TID WC  . omega-3 acid ethyl esters  1 g Oral Daily  . pantoprazole  40 mg Oral Q1200  . sodium bicarbonate  650 mg Oral Daily  . sodium chloride  3 mL Intravenous Q12H  . vitamin B-12  1,000 mcg Oral Daily   Continuous Infusions:   Principal Problem:   Acute on chronic renal failure Active Problems:   DM   Hypertrophic obstructive cardiomyopathy(425.11)   Other dysphagia   Compression fracture of L2 lumbar vertebra  Acute renal failure    Time spent: 49min    [place] [facility] Pager [phone]. If 7PM-7AM, please contact night-coverage at [email or web address], password [facility] 352 11/08/2013, 3:59 PM  LOS: 1 day

## 2013-11-08 NOTE — Plan of Care (Signed)
Problem: Food- and Nutrition-Related Knowledge Deficit (NB-1.1) Goal: Nutrition education Formal process to instruct or train a patient/client in a skill or to impart knowledge to help patients/clients voluntarily manage or modify food choices and eating behavior to maintain or improve health. Outcome: Completed/Met Date Met:  11/08/13  RD consulted for renal diet education.  Patient seen during HD treatment and sleepy with RD visit.  Reviewed food groups and provided written recommended serving sizes determined for patient's current nutritional status.  Provided Choose-A-Meal booklet from [place] for patient and family.  Patient lives at [facility] and will more than likely have diet with Renal restrictions.  Explained why diet restrictions are needed and provided lists of foods to limit/avoid that are high potassium, sodium, and phosphorus. Provided specific recommendations on safer alternatives of these foods.   Expect fair-good compliance.  Body mass index is 34.97 kg/(m^2). Pt meets criteria for Obesity Class I based on current BMI.  Should patient and/or family have further questions, please re-consult RD as needed.  Arthur Holms, RD, LDN Pager #: [phone] After-Hours Pager #: [phone]

## 2013-11-09 LAB — GLUCOSE, CAPILLARY
GLUCOSE-CAPILLARY: 114 mg/dL — AB (ref 70–99)
Glucose-Capillary: 171 mg/dL — ABNORMAL HIGH (ref 70–99)
Glucose-Capillary: 44 mg/dL — CL (ref 70–99)
Glucose-Capillary: 134 mg/dL — ABNORMAL HIGH (ref 70–99)
Glucose-Capillary: 133 mg/dL — ABNORMAL HIGH (ref 70–99)

## 2013-11-09 LAB — CBC
HEMATOCRIT: 27.2 % — AB (ref 36.0–46.0)
Hemoglobin: 8.6 g/dL — ABNORMAL LOW (ref 12.0–15.0)
MCH: 31.9 pg (ref 26.0–34.0)
MCHC: 31.6 g/dL (ref 30.0–36.0)
MCV: 100.7 fL — ABNORMAL HIGH (ref 78.0–100.0)
Platelets: 220 10*3/uL (ref 150–400)
RBC: 2.7 MIL/uL — ABNORMAL LOW (ref 3.87–5.11)
RDW: 14.2 % (ref 11.5–15.5)
WBC: 8.4 10*3/uL (ref 4.0–10.5)

## 2013-11-09 LAB — BASIC METABOLIC PANEL
BUN: 25 mg/dL — AB (ref 6–23)
CHLORIDE: 98 meq/L (ref 96–112)
CO2: 28 mEq/L (ref 19–32)
CREATININE: 4.83 mg/dL — AB (ref 0.50–1.10)
Calcium: 8.6 mg/dL (ref 8.4–10.5)
GFR calc non Af Amer: 7 mL/min — ABNORMAL LOW (ref >90)
GFR, EST AFRICAN AMERICAN: 9 mL/min — AB (ref >90)
Glucose, Bld: 39 mg/dL — CL (ref 70–99)
Potassium: 3.9 mEq/L (ref 3.7–5.3)
Sodium: 141 mEq/L (ref 137–147)

## 2013-11-09 MED ORDER — GLUCOSE 40 % PO GEL
ORAL | Status: AC
  Administered 2013-11-09: 37.5 g
  Filled 2013-11-09: qty 1

## 2013-11-09 MED ORDER — GLUCAGON HCL (RDNA) 1 MG IJ SOLR
INTRAMUSCULAR | Status: AC
  Filled 2013-11-09: qty 1

## 2013-11-09 MED ORDER — DEXTROSE 50 % IV SOLN
INTRAVENOUS | Status: AC
  Administered 2013-11-09: 50 mL
  Filled 2013-11-09: qty 50

## 2013-11-09 MED ORDER — DARBEPOETIN ALFA-POLYSORBATE 60 MCG/0.3ML IJ SOLN: 60.0000 ug | INTRAMUSCULAR | Status: DC

## 2013-11-09 NOTE — Evaluation (Signed)
Clinical/Bedside Swallow Evaluation Patient Details  Name: [name] [name] MRN: [id number] Date of Birth: 02-26-1926  Today's Date: 11/09/2013 Time: 1445-1500 SLP Time Calculation (min): 15 min  Past Medical History:  Past Medical History  Diagnosis Date  . Diabetes mellitus     mellitus? x25 years  . HTN (hypertension)     x2 years  . Hypercholesterolemia     x 2 years  . Renal insufficiency     mild to moderate  . Anemia     chronic anemia due to chronic disease  . CAD (coronary artery disease)     nonobstructive   . GERD (gastroesophageal reflux disease)    Past Surgical History:  Past Surgical History  Procedure Laterality Date  . Total knee arthroplasty      L-2006; R-2008   . Appendectomy    . Back surgery      lower back  . Transthoracic echocardiogram  05/2011  . Av fistula placement  07/07/2012    Procedure: ARTERIOVENOUS (AV) FISTULA CREATION;  Surgeon: Mal Misty, MD;  Location: [facility] OR;  Service: Vascular;  Laterality: Right;  Brachial - cephalic arterivenous fistula  . Esophagogastroduodenoscopy (egd) with esophageal dilation N/A 10/12/2013    Procedure: ESOPHAGOGASTRODUODENOSCOPY (EGD) WITH ESOPHAGEAL DILATION;  Surgeon: Lafayette Dragon, MD;  Location: [facility] ENDOSCOPY;  Service: Endoscopy;  Laterality: N/A;  . Savory dilation N/A 10/12/2013    Procedure: SAVORY DILATION;  Surgeon: Lafayette Dragon, MD;  Location: [facility] ENDOSCOPY;  Service: Endoscopy;  Laterality: N/A;   HPI:  80 year old female with history of chronic kidney disease, stage IV, hypertension, hyperlipidemia, diabetes mellitus, chronic back pain L2 fracture status post recent kyphoplasty 1/15, was recently discharged to SNF on 1/23. History was obtained from the daughter reported that she had visited her in the SNF yesterday when she found her slightly confused having nausea and dry heaving. She did report to the nursing staff about her symptoms. This morning at 3 AM she received a call that patient was being  transferred to [facility] ER due to difficulty breathing and congestion.  EGD 1/19: Moderate esophageal dysmotility.  No definite fixed esophageal narrowing or stricture.  Small hiatal hernia.   Had dilatation.   Dx acute pulmonary edema/acute respiratory failure with hypoxia in the setting of Acute on chronic renal failure    Assessment / Plan / Recommendation Clinical Impression  Pt known to SLP services from January 2015 admission.  At that time, she underwent a clinical swallow eval which indicated a primarily esophageal dysphagia with normal oral and pharyngeal function.  Pt underwent a dilatation during that admission.  She asserts improved swallowing since this procedure.  Today's assessment confirms decreased esophageal s/s with continued adequate oropharyngeal function.  Recommend continuing current diet as ordered.  No further SLP intervention is warranted.          Diet Recommendation Regular;Thin liquid   Medication Administration: Whole meds with liquid Supervision: Patient able to self feed    Other  Recommendations Oral Care Recommendations: Oral care BID   Follow Up Recommendations       Frequency and Duration             SLP Swallow Goals     Swallow Study Prior Functional Status       General HPI: 80 year old female with history of chronic kidney disease, stage IV, hypertension, hyperlipidemia, diabetes mellitus, chronic back pain L2 fracture status post recent kyphoplasty 1/15, was recently discharged to SNF on 1/23. History was  obtained from the daughter reported that she had visited her in the SNF yesterday when she found her slightly confused having nausea and dry heaving. She did report to the nursing staff about her symptoms. This morning at 3 AM she received a call that patient was being transferred to [facility] ER due to difficulty breathing and congestion.  EGD 1/19: Moderate esophageal dysmotility.  No definite fixed esophageal narrowing or  stricture.  Small hiatal hernia.   Had dilatation.   Dx acute pulmonary edema/acute respiratory failure with hypoxia in the setting of Acute on chronic renal failure  Type of Study: Bedside swallow evaluation Previous Swallow Assessment: bedside swallow eval during prior admission in January 2015 Diet Prior to this Study: Regular;Thin liquids Temperature Spikes Noted: No Respiratory Status: Nasal cannula History of Recent Intubation: No Behavior/Cognition: Alert;Cooperative Oral Cavity - Dentition: Adequate natural dentition Self-Feeding Abilities: Able to feed self Patient Positioning: Upright in bed Baseline Vocal Quality: Clear Volitional Cough: Strong Volitional Swallow: Able to elicit    Oral/Motor/Sensory Function Overall Oral Motor/Sensory Function: Appears within functional limits for tasks assessed   Ice Chips Ice chips: Within functional limits Presentation: Spoon   Thin Liquid Thin Liquid: Within functional limits Presentation: Straw    Nectar Thick Nectar Thick Liquid: Not tested   Honey Thick Honey Thick Liquid: Not tested   Puree Puree: Within functional limits   Solid  Norita Meigs L. [place], [place] CCC/SLP Pager [phone]     Solid: Within functional limits       Juan Quam Laurice 11/09/2013,3:00 PM

## 2013-11-09 NOTE — Progress Notes (Signed)
Patient ID: [name] [name], female   DOB: [birth year], 80 y.o.   MRN: [id number]  [facility] Progress Note    Subjective:   "couldn't sleep last night"   Objective:   BP 123/35  Pulse 107  Temp(Src) 97.6 F (36.4 C) (Oral)  Resp 18  Ht 5\' 1"  (1.549 m)  Wt 82.1 kg (181 lb)  BMI 34.22 kg/m2  SpO2 98%  Intake/Output: I/O last 3 completed shifts: In: 100 [P.O.:100] Out: 3590 [Urine:100; GYIRS:8546]   Intake/Output this shift:    Weight change: -0.5 kg (-1 lb 1.6 oz)  Physical Exam: EVO:JJKKX, elderly WF lying in bed CVS:no rub Resp:cta FGH:WEXHBZ Ext:no edema, RUE AVF +T/B, some ecchymosis at needle site  Labs: BMET  Recent Labs Lab 11/07/13 1040 11/07/13 1825 11/08/13 0356 11/09/13 0400  NA 137 136* 138 141  K 4.0 4.1 3.5* 3.9  CL 84* 82* 91* 98  CO2 29 30 27 28   GLUCOSE 91 134* 94 39*  BUN 110* 109* 60* 25*  CREATININE 12.71* 12.90* 8.44* 4.83*  ALBUMIN  --  3.0*  --   --   CALCIUM 8.4 8.3* 8.3* 8.6   CBC  Recent Labs Lab 11/07/13 1040 11/08/13 0356 11/09/13 0400  WBC 9.4 8.6 8.4  HGB 8.4* 8.9* 8.6*  HCT 25.4* 27.7* 27.2*  MCV 95.1 97.5 100.7*  PLT 214 221 220    @IMGRELPRIORS @ Medications:    . amitriptyline  10 mg Oral QHS  . antiseptic oral rinse  15 mL Mouth Rinse q12n4p  . aspirin EC  81 mg Oral Daily  . atorvastatin  20 mg Oral Daily  . calcitRIOL  0.25 mcg Oral Daily  . carvedilol  3.125 mg Oral BID WC  . chlorhexidine  15 mL Mouth Rinse BID  . enoxaparin (LOVENOX) injection  30 mg Subcutaneous Q24H  . febuxostat  40 mg Oral QHS  . feeding supplement (GLUCERNA SHAKE)  237 mL Oral BID BM  . insulin aspart  0-5 Units Subcutaneous QHS  . insulin aspart  0-9 Units Subcutaneous TID WC  . insulin detemir  8 Units Subcutaneous QHS  . lanthanum  500 mg Oral TID WC  . omega-3 acid ethyl esters  1 g Oral Daily  . pantoprazole  40 mg Oral Q1200  . sodium bicarbonate  650 mg Oral Daily  . sodium chloride  3 mL Intravenous  Q12H  . vitamin B-12  1,000 mcg Oral Daily    Assessment/Plan:  1. New ESRD:  Markedly improved after 2 sessions of HD.  1. Plan for HD tomorrow. 2. Cont to UF as tolerated 3. Referral made to [facility] to help arrange for oupt HD. 2. Pulm edema- improving with HD, cont with UF 3. ACDz- iron stores fine, have started ESA 4. DM- per primary svc 5. Mineral metabolism- will check Ca/Phos in am, currently not on binders as she had low Phos 6. AMS- as above, uremia improved after HD but ?sun-downing?  7. Vascular access- RUE AVF +T/B 8. Esophageal stricture s/p dilation- stable 9. H/o Aspiration-per primary svc 10. Deconditioning- would benefit from PT/OT 11. Dispo- back to SNF when outpt HD arranged as well as transportation to and from HD. 12.   Kingsly Kloepfer A 11/09/2013, 8:40 AM

## 2013-11-09 NOTE — Progress Notes (Signed)
ggHypoglycemic Event  CBG: 39  Treatment: glucose gel, OJ with sugar packet; and peanut butter  Symptoms: clammy and sweaty  Follow-up CBG: Time: 0550 CBG Result: 44  Possible Reasons for Event: pt new to dialysis and got 8 units of Levimier HS  Comments/MD notified:Hypoglycemeic protocol followed; received critical lab value call from lab from morning glucose  In blood draw;  Will follow protocol administer D5 IV and recheck in 15 minutes   Newt Minion  Remember to initiate Hypoglycemia Order Set & complete

## 2013-11-09 NOTE — Progress Notes (Addendum)
Clinical Social Work Department CLINICAL SOCIAL WORK PLACEMENT NOTE 11/09/2013  Patient:  [name] [name], [name] [name]  Account Number:  [id number] Admit date:  11/07/2013  Clinical Social Worker:  Megan Salon  Date/time:  11/09/2013 11:03 AM  Clinical Social Work is seeking post-discharge placement for this patient at the following level of care:   [place]   (*CSW will update this form in Epic as items are completed)   11/09/2013  Patient/family provided with [place] Department of Clinical Social Work's list of facilities offering this level of care within the geographic area requested by the patient (or if unable, by the patient's family).  11/09/2013  Patient/family informed of their freedom to choose among providers that offer the needed level of care, that participate in Medicare, Medicaid or managed care program needed by the patient, have an available bed and are willing to accept the patient.  11/09/2013  Patient/family informed of [facility]' ownership interest in [facility], as well as of the fact that they are under no obligation to receive care at this facility.  PASARR submitted to EDS on 11/07/2013 PASARR number received from EDS on   FL2 transmitted to all facilities in geographic area requested by pt/family on  11/09/2013 FL2 transmitted to all facilities within larger geographic area on   Patient informed that his/her managed care company has contracts with or will negotiate with  certain facilities, including the following:     Patient/family informed of bed offers received:  11/09/2013 Patient chooses bed at [facility] Physician recommends and patient chooses bed at    Patient to be transferred to  on  11/11/2013 Patient to be transferred to facility by EMS  The following physician request were entered in Epic:   Additional Comments:  Jeanette Caprice, MSW, [place]

## 2013-11-09 NOTE — Progress Notes (Signed)
Inpatient Diabetes Program Recommendations  AACE/ADA: New Consensus Statement on Inpatient Glycemic Control (2013)  Target Ranges:  Prepandial:   less than 140 mg/dL      Peak postprandial:   less than 180 mg/dL (1-2 hours)      Critically ill patients:  140 - 180 mg/dL   Reason for Visit: Results for AMBERIA, BAYLESS (MRN [id number]) as of 11/09/2013 12:14  Ref. Range 11/09/2013 05:50 11/09/2013 06:16 11/09/2013 11:24  Glucose-Capillary Latest Range: 70-99 mg/dL 44 (LL) 171 (H) 114 (H)   Outpatient Diabetes medications: Levemir 12 units daily Current orders for Inpatient glycemic control: Levemir 8 units daily and Novolog sensitive tid with meals.  Please consider discontinuation of Levemir or decrease Levemir to 4 units daily.  Thanks, Adah Perl, RN, BC-ADM Inpatient Diabetes Coordinator Pager [phone]

## 2013-11-09 NOTE — Progress Notes (Signed)
[facility] PROGRESS NOTE  RANA HOCHSTEIN QQV:[id number] DOB: 10-Jun-1926 DOA: 11/07/2013 PCP: Ann Held, MD Brief Narrative Patient is 87/F with history of CKD IV, hypertension, hyperlipidemia, diabetes mellitus, chronic back pain L2 fracture status post recent kyphoplasty 1/15, was recently discharged to SNF on 1/23.  Daughter reported that she had visited her in the SNF when she found her slightly confused having nausea and dry heaving, the next day patient was transferred to [facility] ER due to difficulty breathing and congestion. In the [facility] ER patient was noted to have BUN of 108 and creatinine of 12, baseline creatinine 4.0. BNP 28,000. Patient was hypoxic as well. Seen by Renal, Started on HD, volume status improving  Assessment/Plan: 1. ARF on CKD with pulmonary edema -Renal following, started this admission on HD  2. DM, type II, uncontrolled with complications, renal nephropathy  - continue Levemir at decreased dose, SSI  3. 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 Chronic dysphagia  - Patient had barium swallow study done during previous admission which showed esophageal dysmotility. She underwent EGD and was unremarkable and was recommended to continue PPI.  - per notes daughter rerported pt declines dysphagia 1 diet  -speech re-eval requested  5. Compression fracture of L2 lumbar vertebra  - Patient underwent kyphoplasty in 1/15, continue PT, OT, pain control   6. Anemia of chronic disease -epo per Renal with HD  DVT prophylaxis: Lovenox   Code Status: DNR Family Communication: d/w daughter at bedside Disposition Plan: likely back to SNF once outpatient HD set up    HPI/Subjective: Some confusion overnight, denies any complaints  Objective: Filed Vitals:   11/09/13 1500   BP: 130/34  Pulse: 63  Temp: 98.1 F (36.7 C)  Resp: 18    Intake/Output Summary (Last 24 hours) at 11/09/13 1707 Last data filed at 11/09/13 1103  Gross per 24 hour  Intake    360 ml  Output    250 ml  Net    110 ml   Filed Weights   11/07/13 2150 11/08/13 1214 11/08/13 1603  Weight: 82.9 kg (182 lb 12.2 oz) 83.9 kg (184 lb 15.5 oz) 82.1 kg (181 lb)    Exam: General: Alert, awake, oriented x3, in no acute distress. HEENT: No bruits, no goiter. Heart: Regular rate and rhythm, without murmurs, rubs, gallops. Lungs: Clear to auscultation bilaterally, no wheezes Abdomen: Soft, nontender, nondistended, positive bowel sounds. Extremities: No clubbing cyanosis  Neuro: Answers questions appropriately, moves extremities equally   Data Reviewed: Basic Metabolic Panel:  Recent Labs Lab 11/07/13 1040 11/07/13 1825 11/08/13 0356 11/09/13 0400  NA 137 136* 138 141  K 4.0 4.1 3.5* 3.9  CL 84* 82* 91* 98  CO2 29 30 27 28   GLUCOSE 91 134* 94 39*  BUN 110* 109* 60* 25*  CREATININE 12.71* 12.90* 8.44* 4.83*  CALCIUM 8.4 8.3* 8.3* 8.6   Liver Function Tests:  Recent Labs Lab 11/07/13 1825  AST 16  ALT 7  ALKPHOS 66  BILITOT 0.4  PROT 6.7  ALBUMIN 3.0*   No results found for this basename: LIPASE, AMYLASE,  in the last 168 hours No results found for this basename: AMMONIA,  in the last 168 hours CBC:  Recent Labs Lab 11/07/13 1040 11/08/13 0356 11/09/13 0400  WBC 9.4 8.6 8.4  HGB 8.4* 8.9* 8.6*  HCT 25.4* 27.7* 27.2*  MCV 95.1 97.5 100.7*  PLT 214 221 220   Cardiac Enzymes: No results found for this basename: CKTOTAL, CKMB, CKMBINDEX, TROPONINI,  in the last 168 hours BNP (last 3 results)  Recent Labs  10/09/13 1134 10/12/13 0611  PROBNP 22117.0* 31197.0*   CBG:  Recent Labs Lab 11/08/13 2116 11/09/13 0550 11/09/13 0616 11/09/13 1124 11/09/13 1635  GLUCAP 125* 44* 171* 114* 134*    Recent Results (from the past 240 hour(s))  MRSA PCR  SCREENING     Status: None   Collection Time    11/07/13  8:03 AM      Result Value Ref Range Status   MRSA by PCR NEGATIVE  NEGATIVE Final   Comment:            The GeneXpert MRSA Assay (FDA     approved for NASAL specimens     only), is one component of a     comprehensive MRSA colonization     surveillance program. It is not     intended to diagnose MRSA     infection nor to guide or     monitor treatment for     MRSA infections.     Studies: Dg Chest 2 View  11/07/2013   CLINICAL DATA:  Productive cough.  EXAM: CHEST  2 VIEW  COMPARISON:  DG CHEST PORTABLE dated 11/07/2013; DG CHEST 1V PORT dated 10/11/2013; DG CHEST 1V PORT dated 10/11/2013; DG CHEST 1V PORT dated 10/07/2013; DG CHEST 2 VIEW dated 07/07/2012  FINDINGS: Cardiomegaly with mild pulmonary vascular prominence noted. Diffuse interstitial prominence noted. Bilateral pleural effusions are present. These findings are consistent with congestive heart failure. Superimposed pneumonitis cannot be excluded . No pleural effusion or pneumothorax. There are multiple mild thoracic compressions noted. This may be from severe osteopenia present.  IMPRESSION: Findings consistent with congestive heart failure with pulmonary edema. Superimposed pneumonitis cannot be excluded.   Electronically Signed   By: Marcello Moores  Register   On: 11/07/2013 18:20    Scheduled Meds: . amitriptyline  10 mg Oral QHS  . antiseptic oral rinse  15 mL Mouth Rinse q12n4p  . aspirin EC  81 mg Oral Daily  . atorvastatin  20 mg Oral Daily  . calcitRIOL  0.25 mcg Oral Daily  . carvedilol  3.125 mg Oral BID WC  . chlorhexidine  15 mL Mouth Rinse BID  . [START ON 11/10/2013] darbepoetin (ARANESP) injection - DIALYSIS  60 mcg Intravenous Q Wed-HD  . enoxaparin (LOVENOX) injection  30 mg Subcutaneous Q24H  . febuxostat  40 mg Oral QHS  . feeding supplement (GLUCERNA SHAKE)  237 mL Oral BID BM  . insulin aspart  0-5 Units Subcutaneous QHS  . insulin aspart  0-9 Units  Subcutaneous TID WC  . insulin detemir  8 Units Subcutaneous QHS  . lanthanum  500 mg Oral TID WC  . omega-3 acid ethyl esters  1 g Oral Daily  . pantoprazole  40 mg Oral Q1200  . sodium bicarbonate  650 mg Oral Daily  . sodium chloride  3 mL Intravenous Q12H  . vitamin B-12  1,000 mcg Oral Daily   Continuous Infusions:   Principal Problem:   Acute on chronic renal failure Active Problems:   DM   Hypertrophic obstructive cardiomyopathy(425.11)   Other dysphagia   Compression fracture of L2 lumbar vertebra   Acute renal failure    Time spent: 35 min    Velvet Bathe  [facility] Pager [phone] If 7PM-7AM, please contact  night-coverage at [email or web address], password [facility] 11/09/2013, 5:07 PM  LOS: 2 days

## 2013-11-09 NOTE — Progress Notes (Signed)
Hypoglycemic Event  CBG: 44  Treatment: 59ml 50% Dextrose  Symptoms: asymptomatic   Follow-up CBG: YYTK:3546 CBG Result:170  Possible Reasons for Event:   Comments/MD notified:    Newt Minion  Remember to initiate Hypoglycemia Order Set & complete

## 2013-11-09 NOTE — Progress Notes (Addendum)
CSW left voicemail for patient's daughter to talk about dc planning. CSW awaiting phone call. Patient has confirmed bed  [name] [name], MSW, [place]

## 2013-11-10 LAB — CBC
HCT: 30.9 % — ABNORMAL LOW (ref 36.0–46.0)
Hemoglobin: 9.5 g/dL — ABNORMAL LOW (ref 12.0–15.0)
MCH: 30.8 pg (ref 26.0–34.0)
MCHC: 30.7 g/dL (ref 30.0–36.0)
MCV: 100.3 fL — ABNORMAL HIGH (ref 78.0–100.0)
PLATELETS: 252 10*3/uL (ref 150–400)
RBC: 3.08 MIL/uL — ABNORMAL LOW (ref 3.87–5.11)
RDW: 14 % (ref 11.5–15.5)
WBC: 9 10*3/uL (ref 4.0–10.5)

## 2013-11-10 LAB — GLUCOSE, CAPILLARY
Glucose-Capillary: 34 mg/dL — CL (ref 70–99)
Glucose-Capillary: 56 mg/dL — ABNORMAL LOW (ref 70–99)
Glucose-Capillary: 124 mg/dL — ABNORMAL HIGH (ref 70–99)
Glucose-Capillary: 81 mg/dL (ref 70–99)
Glucose-Capillary: 112 mg/dL — ABNORMAL HIGH (ref 70–99)
Glucose-Capillary: 154 mg/dL — ABNORMAL HIGH (ref 70–99)

## 2013-11-10 LAB — RENAL FUNCTION PANEL
ALBUMIN: 2.8 g/dL — AB (ref 3.5–5.2)
BUN: 32 mg/dL — AB (ref 6–23)
CHLORIDE: 96 meq/L (ref 96–112)
CO2: 32 mEq/L (ref 19–32)
Calcium: 9 mg/dL (ref 8.4–10.5)
Creatinine, Ser: 6.04 mg/dL — ABNORMAL HIGH (ref 0.50–1.10)
GFR calc Af Amer: 7 mL/min — ABNORMAL LOW (ref >90)
GFR, EST NON AFRICAN AMERICAN: 6 mL/min — AB (ref >90)
Glucose, Bld: 64 mg/dL — ABNORMAL LOW (ref 70–99)
PHOSPHORUS: 4.8 mg/dL — AB (ref 2.3–4.6)
POTASSIUM: 4.1 meq/L (ref 3.7–5.3)
SODIUM: 141 meq/L (ref 137–147)

## 2013-11-10 MED ORDER — HEPARIN SODIUM (PORCINE) 1000 UNIT/ML DIALYSIS
20.0000 [IU]/kg | INTRAMUSCULAR | Status: DC | PRN
  Administered 2013-11-10: 1600 [IU] via INTRAVENOUS_CENTRAL

## 2013-11-10 MED ORDER — DEXTROSE 50 % IV SOLN
INTRAVENOUS | Status: AC
  Administered 2013-11-10: 25 mL
  Filled 2013-11-10: qty 50

## 2013-11-10 NOTE — Progress Notes (Signed)
Patient ID: [name] [name], female   DOB: 1925/12/02, 80 y.o.   MRN: [id number]  [facility] Progress Note    Subjective:   Patient was seen on dialysis and the procedure was supervised. BFR 250 Via RUAVF BP is 110/45.  Patient appears to be tolerating treatment well    Objective:   BP 110/45  Pulse 62  Temp(Src) 98.2 F (36.8 C) (Oral)  Resp 18  Ht 5\' 1"  (1.549 m)  Wt 81.9 kg (180 lb 8.9 oz)  BMI 34.13 kg/m2  SpO2 100%  Intake/Output: I/O last 3 completed shifts: In: 600 [P.O.:600] Out: 450 [Urine:450]   Intake/Output this shift:    Weight change: -5.564 kg (-12 lb 4.3 oz)  Physical Exam: Gen:WD obese WF in NAd CVS:no rub Resp:cta WFU:XNATFT Ext:RUE AVF +T/B  Labs: BMET  Recent Labs Lab 11/07/13 1040 11/07/13 1825 11/08/13 0356 11/09/13 0400 11/10/13 0322  NA 137 136* 138 141 141  K 4.0 4.1 3.5* 3.9 4.1  CL 84* 82* 91* 98 96  CO2 29 30 27 28  32  GLUCOSE 91 134* 94 39* 64*  BUN 110* 109* 60* 25* 32*  CREATININE 12.71* 12.90* 8.44* 4.83* 6.04*  ALBUMIN  --  3.0*  --   --  2.8*  CALCIUM 8.4 8.3* 8.3* 8.6 9.0  PHOS  --   --   --   --  4.8*   CBC  Recent Labs Lab 11/07/13 1040 11/08/13 0356 11/09/13 0400 11/10/13 0322  WBC 9.4 8.6 8.4 9.0  HGB 8.4* 8.9* 8.6* 9.5*  HCT 25.4* 27.7* 27.2* 30.9*  MCV 95.1 97.5 100.7* 100.3*  PLT 214 221 220 252    @IMGRELPRIORS @ Medications:    . amitriptyline  10 mg Oral QHS  . antiseptic oral rinse  15 mL Mouth Rinse q12n4p  . aspirin EC  81 mg Oral Daily  . atorvastatin  20 mg Oral Daily  . calcitRIOL  0.25 mcg Oral Daily  . carvedilol  3.125 mg Oral BID WC  . chlorhexidine  15 mL Mouth Rinse BID  . darbepoetin (ARANESP) injection - DIALYSIS  60 mcg Intravenous Q Wed-HD  . enoxaparin (LOVENOX) injection  30 mg Subcutaneous Q24H  . febuxostat  40 mg Oral QHS  . feeding supplement (GLUCERNA SHAKE)  237 mL Oral BID BM  . insulin aspart  0-5 Units Subcutaneous QHS  . insulin aspart  0-9  Units Subcutaneous TID WC  . insulin detemir  8 Units Subcutaneous QHS  . lanthanum  500 mg Oral TID WC  . omega-3 acid ethyl esters  1 g Oral Daily  . pantoprazole  40 mg Oral Q1200  . sodium bicarbonate  650 mg Oral Daily  . sodium chloride  3 mL Intravenous Q12H  . vitamin B-12  1,000 mcg Oral Daily     Assessment/ Plan:    1. New ESRD: Markedly improved after 2 sessions of HD.  1. Cont with HD qTTS for now. 2. Cont to UF as tolerated 3. Referral made to [facility] to help arrange for oupt HD. 2. Pulm edema- improving with HD, cont with UF 3. ACDz- iron stores fine, have started ESA 4. DM- per primary svc 5. Mineral metabolism- will check Ca/Phos in am, currently not on binders as she had low Phos 6. AMS- as above, uremia improved after HD but ?sun-downing?  7. Vascular access- RUE AVF +T/B 8. Esophageal stricture s/p dilation- stable 9. H/o Aspiration-per primary svc 10. Deconditioning- would benefit from PT/OT 11. Dispo-  back to SNF when outpt HD arranged as well as transportation to and from HD. 12.   Keaunna Skipper A 11/10/2013, 9:55 AM

## 2013-11-10 NOTE — Progress Notes (Signed)
Attending nurse will give report about patient blood sugar level during progression to the MD. Followed protocol to bring it back up.

## 2013-11-10 NOTE — Evaluation (Signed)
Physical Therapy Evaluation Patient Details Name: [name] [name] MRN: [id number] DOB: 02/15/1926 Today's Date: 11/10/2013 Time: 6834-1962 PT Time Calculation (min): 22 min  PT Assessment / Plan / Recommendation History of Present Illness  Readmitted from SNF (there for short-term rehab following fall and kyphoplasty) with acute renal insufficiency.  Clinical Impression  Pt admitted with renal insufficiency. Pt receiving therapies at SNF PTA for ultimate return to her daughter's home. Pt currently with functional limitations due to the deficits listed below (see PT Problem List).  Pt will benefit from skilled PT to increase their independence and safety with mobility to allow discharge to the venue listed below.       PT Assessment  Patient needs continued PT services    Follow Up Recommendations  SNF (per pt, daughter was looking into a different facility)    Does the patient have the potential to tolerate intense rehabilitation      Barriers to Discharge Decreased caregiver support      Equipment Recommendations  None recommended by PT    Recommendations for Other Services     Frequency Min 2X/week    Precautions / Restrictions Precautions Precautions: Fall   Pertinent Vitals/Pain Denied back pain      Mobility  Bed Mobility Overal bed mobility: Needs Assistance Bed Mobility: Rolling;Sidelying to Sit;Sit to Supine Rolling: Supervision Sidelying to sit: Min guard;HOB elevated Sit to supine: Min guard General bed mobility comments: pt required cues for sequencing to protect her back (side to sit vs pt attmepting supine to sit); on return to bed, pt preferred sit to supine and denied pain in back. Educated on pressure this applies to her back and risk for incr pain Transfers Overall transfer level: Needs assistance Equipment used: Rolling walker (2 wheeled) Transfers: Sit to/from Stand Sit to Stand: Min assist General transfer comment: x2 from bed; vc for safe use of  RW; steady assist as she lost her balance posteriorly Ambulation/Gait Ambulation/Gait assistance: Min assist Ambulation Distance (Feet): 2 Feet Assistive device: Rolling walker (2 wheeled) General Gait Details: side step up to [facility] only due to pt fatigue after dialysis    Exercises General Exercises - Lower Extremity Ankle Circles/Pumps: AROM;Both;10 reps;Supine Hip Flexion/Marching: AAROM;Both;10 reps;Standing (assist for balance )   PT Diagnosis: Difficulty walking;Generalized weakness  PT Problem List: Decreased strength;Decreased range of motion;Decreased activity tolerance;Decreased balance;Decreased mobility;Decreased knowledge of use of DME PT Treatment Interventions: DME instruction;Gait training;Functional mobility training;Therapeutic activities;Therapeutic exercise;Balance training;Patient/family education     PT Goals(Current goals can be found in the care plan section) Acute Rehab PT Goals Patient Stated Goal: achieve modified independence with gait and basic ADLs PT Goal Formulation: With patient Time For Goal Achievement: 11/17/13 Potential to Achieve Goals: Good  Visit Information  Last PT Received On: 11/10/13 Assistance Needed: +1 History of Present Illness: Readmitted from SNF (there for short-term rehab following fall and kyphoplasty) with acute renal insufficiency.       Prior Dumas expects to be discharged to:: Skilled nursing facility Additional Comments: return to SNF for rehab with goal to return to lving in apt at her daughter's home Prior Function Level of Independence: Needs assistance Gait / Transfers Assistance Needed: walking with RW with assist ADL's / Homemaking Assistance Needed: bathing and dressing assist Communication Communication: No difficulties    Cognition  Cognition Arousal/Alertness: Awake/alert Behavior During Therapy: Flat affect Overall Cognitive Status: Within Functional Limits for tasks  assessed    Extremity/Trunk Assessment Upper Extremity Assessment Upper Extremity  Assessment: Generalized weakness Lower Extremity Assessment Lower Extremity Assessment: Generalized weakness;RLE deficits/detail;LLE deficits/detail RLE Deficits / Details: ankle DF to neutral only LLE Deficits / Details: ankle DF -10 degrees Cervical / Trunk Assessment Cervical / Trunk Assessment: Kyphotic   Balance Balance Overall balance assessment: Needs assistance Standing balance support: Bilateral upper extremity supported Standing balance-Leahy Scale: Poor  End of Session PT - End of Session Equipment Utilized During Treatment: Oxygen Activity Tolerance: Patient limited by fatigue Patient left: in bed;with call bell/phone within reach  GP     Philis Doke 11/10/2013, 3:37 PM Pager [phone]

## 2013-11-10 NOTE — Progress Notes (Signed)
PT Cancellation Note  Patient Details Name: [name] [name] MRN: [id number] DOB: Jan 26, 1926   Cancelled Treatment:    Reason Eval/Treat Not Completed: Patient at procedure or test/unavailable. In dialysis. Will see later today.   Bristol Osentoski 11/10/2013, 8:03 AM Pager [phone]

## 2013-11-10 NOTE — Progress Notes (Signed)
CRITICAL VALUE ALERT  Critical value received:  34  Date of notification:  11/10/13  Time of notification: 609  Critical value read back:yes  Nurse who received alert: Forestine Chute  MD notified (1st page):  Followed protocol  Time of first page:    MD notified (2nd page):  Time of second page:  Responding MD:   Time MD responded:

## 2013-11-10 NOTE — Progress Notes (Signed)
[facility] PROGRESS NOTE  [facility] GBT:[id number] DOB: [birth year] DOA: 11/07/2013 PCP: Ann Held, MD Brief Narrative Patient is 87/F with history of CKD IV, hypertension, hyperlipidemia, diabetes mellitus, chronic back pain L2 fracture status post recent kyphoplasty 1/15, was recently discharged to SNF on 1/23.  Daughter reported that she had visited her in the SNF when she found her slightly confused having nausea and dry heaving, the next day patient was transferred to [facility] ER due to difficulty breathing and congestion. In the [facility] ER patient was noted to have BUN of 108 and creatinine of 12, baseline creatinine 4.0. BNP 28,000. Patient was hypoxic as well. Seen by Renal, Started on HD, volume status improving  Assessment/Plan: 1. ARF on CKD with pulmonary edema - Renal following, started this admission on HD - HD schedule T/T/S - Awaiting recommendations and outpatient plan. Discussed with case manager  2. DM, type II, uncontrolled with complications, renal nephropathy  - Discontinue Levemir-hypoglycemic 11/10/13 in the AM treated per protocol - SSI  3. 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 Chronic dysphagia  - Patient had barium swallow study done during previous admission which showed esophageal dysmotility. She underwent EGD and was unremarkable and was recommended to continue PPI.  - per notes daughter rerported pt declines dysphagia 1 diet  -speech re-eval: recommended Regular, thin liquid diet and BID oral care  5. Compression fracture of L2 lumbar vertebra  - Patient underwent kyphoplasty in 1/15,  - continue PT, OT, and pain control    6. Anemia of chronic disease - epo per Renal with HD  DVT prophylaxis: Lovenox   Code Status: DNR Family Communication: No family  present. Disposition Plan: likely back to SNF once outpatient HD set up  Consultants:  Nephrology Speech  PT/OT  Procedures:  HD  Antibiotics:  None  HPI/Subjective: Denies any complaints. HD today.  Objective: Filed Vitals:   11/10/13 1240  BP: 135/39  Pulse: 70  Temp: 97.9 F (36.6 C)  Resp: 18    Intake/Output Summary (Last 24 hours) at 11/10/13 1350 Last data filed at 11/10/13 1156  Gross per 24 hour  Intake    240 ml  Output   2200 ml  Net  -1960 ml   Filed Weights   11/10/13 0526 11/10/13 0759 11/10/13 1156  Weight: 172 lb 11.2 oz (78.336 kg) 180 lb 8.9 oz (81.9 kg) 176 lb 2.4 oz (79.9 kg)    Exam: General: Alert, awake, oriented x3, in no acute distress. HEENT: No bruits, no goiter. Heart: Regular rate and rhythm, without murmurs, rubs, or gallops. Lungs: Clear to auscultation bilaterally, no wheezes Abdomen: Soft, nontender, nondistended, positive bowel sounds. Extremities: No clubbing cyanosis  Neuro: Answers questions appropriately, moves extremities equally   Data Reviewed: Basic Metabolic Panel:  Recent Labs Lab 11/07/13 1040 11/07/13 1825 11/08/13 0356 11/09/13 0400 11/10/13 0322  NA 137 136* 138 141 141  K 4.0 4.1 3.5* 3.9 4.1  CL 84* 82* 91* 98 96  CO2 29 30 27 28  32  GLUCOSE 91 134* 94 39* 64*  BUN 110* 109* 60* 25* 32*  CREATININE 12.71* 12.90* 8.44* 4.83* 6.04*  CALCIUM 8.4 8.3* 8.3* 8.6 9.0  PHOS  --   --   --   --  4.8*   Liver Function Tests:  Recent Labs Lab 11/07/13 1825 11/10/13 0322  AST  16  --   ALT 7  --   ALKPHOS 66  --   BILITOT 0.4  --   PROT 6.7  --   ALBUMIN 3.0* 2.8*   No results found for this basename: LIPASE, AMYLASE,  in the last 168 hours No results found for this basename: AMMONIA,  in the last 168 hours CBC:  Recent Labs Lab 11/07/13 1040 11/08/13 0356 11/09/13 0400 11/10/13 0322  WBC 9.4 8.6 8.4 9.0  HGB 8.4* 8.9* 8.6* 9.5*  HCT 25.4* 27.7* 27.2* 30.9*  MCV 95.1 97.5 100.7* 100.3*   PLT 214 221 220 252   Cardiac Enzymes: No results found for this basename: CKTOTAL, CKMB, CKMBINDEX, TROPONINI,  in the last 168 hours BNP (last 3 results)  Recent Labs  10/09/13 1134 10/12/13 0611  PROBNP 22117.0* 31197.0*   CBG:  Recent Labs Lab 11/09/13 2037 11/10/13 0609 11/10/13 0642 11/10/13 0732 11/10/13 1242  GLUCAP 133* 34* 56* 124* 81    Recent Results (from the past 240 hour(s))  MRSA PCR SCREENING     Status: None   Collection Time    11/07/13  8:03 AM      Result Value Ref Range Status   MRSA by PCR NEGATIVE  NEGATIVE Final   Comment:            The GeneXpert MRSA Assay (FDA     approved for NASAL specimens     only), is one component of a     comprehensive MRSA colonization     surveillance program. It is not     intended to diagnose MRSA     infection nor to guide or     monitor treatment for     MRSA infections.  URINE CULTURE     Status: None   Collection Time    11/08/13 11:50 PM      Result Value Ref Range Status   Specimen Description URINE, RANDOM   Final   Special Requests NONE   Final   Culture  Setup Time     Final   Value: 11/09/2013 03:42     Performed at [place]     Final   Value: >=100,000 COLONIES/ML     Performed at [facility]   Culture     Final   Value: [place]     Performed at [facility]   Report Status PENDING   Incomplete     Studies: No results found.  Scheduled Meds: . amitriptyline  10 mg Oral QHS  . antiseptic oral rinse  15 mL Mouth Rinse q12n4p  . aspirin EC  81 mg Oral Daily  . atorvastatin  20 mg Oral Daily  . calcitRIOL  0.25 mcg Oral Daily  . carvedilol  3.125 mg Oral BID WC  . chlorhexidine  15 mL Mouth Rinse BID  . darbepoetin (ARANESP) injection - DIALYSIS  60 mcg Intravenous Q Wed-HD  . enoxaparin (LOVENOX) injection  30 mg Subcutaneous Q24H  . febuxostat  40 mg Oral QHS  . feeding supplement (GLUCERNA SHAKE)  237 mL Oral BID BM  .  insulin aspart  0-5 Units Subcutaneous QHS  . insulin aspart  0-9 Units Subcutaneous TID WC  . lanthanum  500 mg Oral TID WC  . omega-3 acid ethyl esters  1 g Oral Daily  . pantoprazole  40 mg Oral Q1200  . sodium bicarbonate  650 mg Oral Daily  . sodium chloride  3 mL Intravenous Q12H  . vitamin B-12  1,000 mcg Oral Daily   Continuous Infusions:   Principal Problem:   Acute on chronic renal failure Active Problems:   DM   Hypertrophic obstructive cardiomyopathy(425.11)   Other dysphagia   Compression fracture of L2 lumbar vertebra   Acute renal failure    Time spent: 35 min    Larwance Sachs  [facility] Pager [phone] If 7PM-7AM, please contact night-coverage at [email or web address], password [facility] 11/10/2013, 1:50 PM  LOS: 3 days

## 2013-11-11 LAB — GLUCOSE, CAPILLARY
Glucose-Capillary: 118 mg/dL — ABNORMAL HIGH (ref 70–99)
Glucose-Capillary: 137 mg/dL — ABNORMAL HIGH (ref 70–99)

## 2013-11-11 LAB — URINE CULTURE: Colony Count: >=100000

## 2013-11-11 MED ORDER — DIAZEPAM 5 MG PO TABS: 5.0000 mg | ORAL_TABLET | Freq: Two times a day (BID) | ORAL | Status: DC | PRN

## 2013-11-11 MED ORDER — TRAMADOL HCL 50 MG PO TABS: 50.0000 mg | ORAL_TABLET | Freq: Four times a day (QID) | ORAL | Status: DC | PRN

## 2013-11-11 MED ORDER — GLUCERNA SHAKE PO LIQD: 237.0000 mL | Freq: Two times a day (BID) | ORAL | Status: AC

## 2013-11-11 MED ORDER — CARVEDILOL 3.125 MG PO TABS: 3.1250 mg | ORAL_TABLET | Freq: Two times a day (BID) | ORAL | Status: DC

## 2013-11-11 MED ORDER — ATORVASTATIN CALCIUM 20 MG PO TABS: 20.0000 mg | ORAL_TABLET | Freq: Every day | ORAL | Status: DC

## 2013-11-11 NOTE — Progress Notes (Signed)
CSW called [name] [name] in dialysis to see if they have set up outpatient and to explain that patient has been accepted at [facility]. [name] [name] checking on this and will get back with Education officer, museum. CSW left voicemail for patient's daughter.  Jeanette Caprice, MSW, [place]

## 2013-11-11 NOTE — Progress Notes (Signed)
Pt blood pressure was 129/30. MD paged. Got a call back. Continue to monitor patient as per MD order.

## 2013-11-11 NOTE — Progress Notes (Signed)
Patient ID: [name] [name], female   DOB: [birth year], 80 y.o.   MRN: [id number]  [facility] Progress Note    Subjective:   No new complaints   Objective:   BP 129/30  Pulse 73  Temp(Src) 98.2 F (36.8 C) (Oral)  Resp 18  Ht 5\' 1"  (1.549 m)  Wt 77.7 kg (171 lb 4.8 oz)  BMI 32.38 kg/m2  SpO2 100%  Intake/Output: I/O last 3 completed shifts: In: -  Out: 2000 [Other:2000]   Intake/Output this shift:  Total I/O In: 240 [P.O.:240] Out: -  Weight change: 3.564 kg (7 lb 13.7 oz)  Physical Exam: Gen:WD elderly obese WF in NAd CVS:no rub Resp:cta JKD:TOIZT, +BS, soft Ext:no edema, RUE AVF +T/B with some ecchymosis around avf  Labs: BMET  Recent Labs Lab 11/07/13 1040 11/07/13 1825 11/08/13 0356 11/09/13 0400 11/10/13 0322  NA 137 136* 138 141 141  K 4.0 4.1 3.5* 3.9 4.1  CL 84* 82* 91* 98 96  CO2 29 30 27 28  32  GLUCOSE 91 134* 94 39* 64*  BUN 110* 109* 60* 25* 32*  CREATININE 12.71* 12.90* 8.44* 4.83* 6.04*  ALBUMIN  --  3.0*  --   --  2.8*  CALCIUM 8.4 8.3* 8.3* 8.6 9.0  PHOS  --   --   --   --  4.8*   CBC  Recent Labs Lab 11/07/13 1040 11/08/13 0356 11/09/13 0400 11/10/13 0322  WBC 9.4 8.6 8.4 9.0  HGB 8.4* 8.9* 8.6* 9.5*  HCT 25.4* 27.7* 27.2* 30.9*  MCV 95.1 97.5 100.7* 100.3*  PLT 214 221 220 252    @IMGRELPRIORS @ Medications:    . amitriptyline  10 mg Oral QHS  . antiseptic oral rinse  15 mL Mouth Rinse q12n4p  . aspirin EC  81 mg Oral Daily  . atorvastatin  20 mg Oral Daily  . calcitRIOL  0.25 mcg Oral Daily  . carvedilol  3.125 mg Oral BID WC  . chlorhexidine  15 mL Mouth Rinse BID  . darbepoetin (ARANESP) injection - DIALYSIS  60 mcg Intravenous Q Wed-HD  . enoxaparin (LOVENOX) injection  30 mg Subcutaneous Q24H  . febuxostat  40 mg Oral QHS  . feeding supplement (GLUCERNA SHAKE)  237 mL Oral BID BM  . insulin aspart  0-5 Units Subcutaneous QHS  . insulin aspart  0-9 Units Subcutaneous TID WC  . lanthanum  500 mg  Oral TID WC  . omega-3 acid ethyl esters  1 g Oral Daily  . pantoprazole  40 mg Oral Q1200  . sodium bicarbonate  650 mg Oral Daily  . sodium chloride  3 mL Intravenous Q12H  . vitamin B-12  1,000 mcg Oral Daily     Assessment/ Plan:   1. New ESRD: Markedly improved after 2 sessions of HD.  1. Cont with HD qTTS for now. 2. Cont to UF as tolerated 3. Awaiting transfer to [facility] once SNF placement is arranged. 2. Pulm edema- improved with HD, cont with UF 3. ACDz- iron stores fine, have started ESA 4. DM- per primary svc 5. Mineral metabolism- will check Ca/Phos in am, currently not on binders as she had low Phos 6. AMS- as above, uremia improved after HD but ?sun-downing?  7. Vascular access- RUE AVF +T/B 8. Esophageal stricture s/p dilation- stable 9. H/o Aspiration-per primary svc 10. Deconditioning- would benefit from PT/OT 11. Dispo- back to SNF when accepted and transportation arranged to and from HD. 12.   [name] [name]  A 11/11/2013, 11:01 AM

## 2013-11-11 NOTE — Progress Notes (Cosign Needed)
[facility] PROGRESS NOTE  [name] [name] FAO:[id number] DOB: [birth year] DOA: 11/07/2013 PCP: Ann Held, MD Brief Narrative Patient is 87/F with history of CKD IV, hypertension, hyperlipidemia, diabetes mellitus, chronic back pain L2 fracture status post recent kyphoplasty 1/15, was recently discharged to SNF on 1/23.  Daughter reported that she had visited her in the SNF when she found her slightly confused having nausea and dry heaving, the next day patient was transferred to [facility] ER due to difficulty breathing and congestion. In the [facility] ER patient was noted to have BUN of 108 and creatinine of 12, baseline creatinine 4.0. BNP 28,000. Patient was hypoxic as well. Seen by Renal, Started on HD, volume status improving  Assessment/Plan: 1. ARF on CKD with pulmonary edema - Renal following, started this admission on HD - HD schedule T/T/S - Awaiting coordination of outpatient HD. Discussed with case manager  2. DM, type II, uncontrolled with complications, renal nephropathy  - Discontinue Levemir (hypoglycemic on 11/10/13)  - Controlled on SSI   3. Hypertrophic obstructive cardiomyopathy(425.11), chronic diastolic dysfunction, moderate pulmonary hypertension  - 2-D echocardiogram done which showed EF of 65- 70%  - Volume managed with HD now  4. Chronic dysphagia  - Patient had barium swallow study done during previous admission which showed esophageal dysmotility. She underwent EGD and was unremarkable and was recommended to continue PPI.  - Per notes daughter rerported pt declines dysphagia 1 diet  - Speech re-eval: recommended Regular, thin liquid diet and BID oral care  5. Compression fracture of L2 lumbar vertebra  - Patient underwent kyphoplasty in 1/15,  - continue PT, OT, and pain control    6. Anemia of chronic disease - epo per Renal with HD  DVT prophylaxis: Lovenox   Code Status: DNR Family Communication: No family  present. Disposition Plan: Plan to discharge to SNF [facility]) once outpatient HD set up  Consultants:  Nephrology Speech  PT/OT  Procedures:  HD  Antibiotics:  None  HPI/Subjective: Denies any complaints. HD scheduled for tomorrow, 11/12/13  Objective: Filed Vitals:   11/11/13 1103  BP: 125/35  Pulse: 65  Temp:   Resp:     Intake/Output Summary (Last 24 hours) at 11/11/13 1131 Last data filed at 11/11/13 0900  Gross per 24 hour  Intake    240 ml  Output   2000 ml  Net  -1760 ml   Filed Weights   11/10/13 0759 11/10/13 1156 11/11/13 0443  Weight: 81.9 kg (180 lb 8.9 oz) 79.9 kg (176 lb 2.4 oz) 77.7 kg (171 lb 4.8 oz)    Exam: General: Alert, awake, oriented x3, in no acute distress. HEENT: No bruits, no goiter. Heart: Regular rate and rhythm, no rubs or gallops. Lungs: Clear to auscultation bilaterally, no wheezes Abdomen: Soft, nontender, nondistended, positive bowel sounds. Extremities: No clubbing or cyanosis  Neuro: Answers questions appropriately, moves extremities equally   Data Reviewed: Basic Metabolic Panel:  Recent Labs Lab 11/07/13 1040 11/07/13 1825 11/08/13 0356 11/09/13 0400 11/10/13 0322  NA 137 136* 138 141 141  K 4.0 4.1 3.5* 3.9 4.1  CL 84* 82* 91* 98 96  CO2 29 30 27 28  32  GLUCOSE 91 134* 94 39* 64*  BUN 110* 109* 60* 25* 32*  CREATININE 12.71* 12.90* 8.44* 4.83* 6.04*  CALCIUM 8.4 8.3* 8.3* 8.6 9.0  PHOS  --   --   --   --  4.8*   Liver Function Tests:  Recent Labs Lab 11/07/13 1825  11/10/13 0322  AST 16  --   ALT 7  --   ALKPHOS 66  --   BILITOT 0.4  --   PROT 6.7  --   ALBUMIN 3.0* 2.8*   No results found for this basename: LIPASE, AMYLASE,  in the last 168 hours No results found for this basename: AMMONIA,  in the last 168 hours CBC:  Recent Labs Lab 11/07/13 1040 11/08/13 0356 11/09/13 0400 11/10/13 0322  WBC 9.4 8.6 8.4 9.0  HGB 8.4* 8.9* 8.6* 9.5*  HCT 25.4* 27.7* 27.2* 30.9*  MCV 95.1 97.5  100.7* 100.3*  PLT 214 221 220 252   Cardiac Enzymes: No results found for this basename: CKTOTAL, CKMB, CKMBINDEX, TROPONINI,  in the last 168 hours BNP (last 3 results)  Recent Labs  10/09/13 1134 10/12/13 0611  PROBNP 22117.0* 31197.0*   CBG:  Recent Labs Lab 11/10/13 0732 11/10/13 1242 11/10/13 1602 11/10/13 2111 11/11/13 0606  GLUCAP 124* 81 112* 154* 118*    Recent Results (from the past 240 hour(s))  MRSA PCR SCREENING     Status: None   Collection Time    11/07/13  8:03 AM      Result Value Ref Range Status   MRSA by PCR NEGATIVE  NEGATIVE Final   Comment:            The GeneXpert MRSA Assay (FDA     approved for NASAL specimens     only), is one component of a     comprehensive MRSA colonization     surveillance program. It is not     intended to diagnose MRSA     infection nor to guide or     monitor treatment for     MRSA infections.  URINE CULTURE     Status: None   Collection Time    11/08/13 11:50 PM      Result Value Ref Range Status   Specimen Description URINE, RANDOM   Final   Special Requests NONE   Final   Culture  Setup Time     Final   Value: 11/09/2013 03:42     Performed at [place]     Final   Value: >=100,000 COLONIES/ML     Performed at [facility]   Culture     Final   Value: PROTEUS VULGARIS     Performed at [facility]   Report Status 11/11/2013 FINAL   Final   Organism ID, Bacteria PROTEUS VULGARIS   Final     Studies: No results found.  Scheduled Meds: . amitriptyline  10 mg Oral QHS  . antiseptic oral rinse  15 mL Mouth Rinse q12n4p  . aspirin EC  81 mg Oral Daily  . atorvastatin  20 mg Oral Daily  . calcitRIOL  0.25 mcg Oral Daily  . carvedilol  3.125 mg Oral BID WC  . chlorhexidine  15 mL Mouth Rinse BID  . darbepoetin (ARANESP) injection - DIALYSIS  60 mcg Intravenous Q Wed-HD  . enoxaparin (LOVENOX) injection  30 mg Subcutaneous Q24H  . febuxostat  40 mg Oral QHS   . feeding supplement (GLUCERNA SHAKE)  237 mL Oral BID BM  . insulin aspart  0-5 Units Subcutaneous QHS  . insulin aspart  0-9 Units Subcutaneous TID WC  . lanthanum  500 mg Oral TID WC  . omega-3 acid ethyl esters  1 g Oral Daily  . pantoprazole  40 mg Oral Q1200  .  sodium bicarbonate  650 mg Oral Daily  . sodium chloride  3 mL Intravenous Q12H  . vitamin B-12  1,000 mcg Oral Daily   Continuous Infusions:   Principal Problem:   Acute on chronic renal failure Active Problems:   DM   Hypertrophic obstructive cardiomyopathy(425.11)   Other dysphagia   Compression fracture of L2 lumbar vertebra   Acute renal failure    Time spent: > 35 min    Larwance Sachs  [facility] Pager [phone] If 7PM-7AM, please contact night-coverage at [email or web address], password [email or web address] 11/11/2013, 11:31 AM  LOS: 4 days

## 2013-11-11 NOTE — Progress Notes (Signed)
Assessment unchanged. Discussed D/C instructions with pt and family including medications and f/u appointments. Verbalized understanding. IV and tele removed. Pt left via EMS to facility.

## 2013-11-11 NOTE — Progress Notes (Signed)
Clinical Social Worker facilitated patient discharge by contacting the patient, family and facility,[facility]. Patient agreeable to this plan and arranging transport via EMS . CSW will sign off, as social work intervention is no longer needed.  Jeanette Caprice, MSW, [place]

## 2013-11-11 NOTE — Discharge Summary (Signed)
Physician Discharge Summary  [name] [name] [id number] DOB: Dec 17, 1925 DOA: 11/07/2013  PCP: Ann Held, MD  Admit date: 11/07/2013 Discharge date: 11/11/2013  Time spent: > 35 minutes  Recommendations for Outpatient Follow-up:  1. Continue to monitor hemoglobin levels 2. Ensure patient continue to get her dialysis sessions 3. Monitor blood sugars and adjust hypoglycemic agents  Discharge Diagnoses:  Principal Problem:   Acute on chronic renal failure Active Problems:   DM   Hypertrophic obstructive cardiomyopathy(425.11)   Other dysphagia   Compression fracture of L2 lumbar vertebra   Acute renal failure   Discharge Condition: Stable  Diet recommendation: Renal diet/diabetic diet  Filed Weights   11/10/13 0759 11/10/13 1156 11/11/13 0443  Weight: 81.9 kg (180 lb 8.9 oz) 79.9 kg (176 lb 2.4 oz) 77.7 kg (171 lb 4.8 oz)    History of present illness:  Patient is 87/F with history of CKD IV, hypertension, hyperlipidemia, diabetes mellitus, chronic back pain L2 fracture status post recent kyphoplasty 1/15, was recently discharged to SNF on 1/23.  Daughter reported that she had visited her in the SNF when she found her slightly confused having nausea and dry heaving, the next day patient was transferred to [facility] ER due to difficulty breathing and congestion.  In the [facility] ER patient was noted to have BUN of 108 and creatinine of 12, baseline creatinine 4.0. BNP 28,000. Patient was hypoxic as well.  Seen by Renal, Started on HD, volume status improving  Hospital Course:  1. ARF on CKD with pulmonary edema - Renal on board while patient in house - Outpatient hemodialysis has been set up. Next dialysis session scheduled for 11/14/13  2. DM, type II, uncontrolled with complications, renal nephropathy  - Will discharge on sliding scale insulin -Recommend diabetic diet -Routine blood sugar monitoring at least twice a day fasting and postprandial  3.  Hypertrophic obstructive cardiomyopathy(425.11), chronic diastolic dysfunction, moderate pulmonary hypertension  - 2-D echocardiogram done which showed EF of 65- 70%  - Volume managed with HD now   4. Chronic dysphagia  - Patient had barium swallow study done during previous admission which showed esophageal dysmotility. She underwent EGD and was unremarkable and was recommended to continue PPI.  - Per notes daughter rerported pt declines dysphagia 1 diet  - Speech re-eval: recommended Regular, thin liquid diet and BID oral care   5. Compression fracture of L2 lumbar vertebra  - Patient underwent kyphoplasty in 1/15,  - continue PT, OT, and pain control   6. Anemia of chronic disease  - epo per Renal with HD  Procedures:  As listed above  Consultations:  Nephrology  Discharge Exam: Filed Vitals:   11/11/13 1103  BP: 125/35  Pulse: 65  Temp:   Resp:     General: Pt in NAD, Alert and awake Cardiovascular: RRR, no MRG Respiratory: CTA BL, no wheezes  Discharge Instructions  Discharge Orders   Future Appointments Provider Department Dept Phone   11/15/2013 11:10 AM Liliane Shi, PA-C [place] [facility] [phone]   Future Orders Complete By Expires   Call MD for:  difficulty breathing, headache or visual disturbances  As directed    Call MD for:  temperature >100.4  As directed    Diet Carb Modified  As directed    Comments:     Renal/Carb modified 1272ml fluid restriction:   Increase activity slowly  As directed        Medication List    STOP taking  these medications       amLODipine 10 MG tablet  Commonly known as:  NORVASC     CALCIUM 500 PO     CINNAMON PO     furosemide 80 MG tablet  Commonly known as:  LASIX     GARLIC PO     HYDROmorphone 2 MG tablet  Commonly known as:  DILAUDID     insulin detemir 100 UNIT/ML injection  Commonly known as:  LEVEMIR     metolazone 2.5 MG tablet  Commonly known as:  ZAROXOLYN      metoprolol succinate 50 MG 24 hr tablet  Commonly known as:  TOPROL-XL     nateglinide 60 MG tablet  Commonly known as:  STARLIX     pravastatin 80 MG tablet  Commonly known as:  PRAVACHOL     promethazine 25 MG tablet  Commonly known as:  PHENERGAN      TAKE these medications       amitriptyline 10 MG tablet  Commonly known as:  ELAVIL  Take 10 mg by mouth at bedtime.     aspirin EC 81 MG tablet  Take 81 mg by mouth daily.     atorvastatin 20 MG tablet  Commonly known as:  LIPITOR  Take 1 tablet (20 mg total) by mouth daily.     calcitRIOL 0.25 MCG capsule  Commonly known as:  ROCALTROL  Take 1 capsule (0.25 mcg total) by mouth daily.     carvedilol 3.125 MG tablet  Commonly known as:  COREG  Take 1 tablet (3.125 mg total) by mouth 2 (two) times daily with a meal.     cholecalciferol 1000 UNITS tablet  Commonly known as:  VITAMIN D  Take 1,000 Units by mouth daily.     cyanocobalamin 1000 MCG/ML injection  Commonly known as:  (VITAMIN B-12)  Inject 1,000 mcg into the muscle every 30 (thirty) days.     diazepam 5 MG tablet  Commonly known as:  VALIUM  Take 1 tablet (5 mg total) by mouth every 12 (twelve) hours as needed for anxiety.     febuxostat 40 MG tablet  Commonly known as:  ULORIC  Take 40 mg by mouth at bedtime.     feeding supplement (GLUCERNA SHAKE) Liqd  Take 237 mLs by mouth 2 (two) times daily between meals.     fish oil-omega-3 fatty acids 1000 MG capsule  Take 1 g by mouth daily.     Glucosamine-Chondroitin 250-200 MG Caps  Take 1 capsule by mouth daily.     insulin regular 100 units/mL injection  Commonly known as:  NOVOLIN R,HUMULIN R  Inject 0-12 Units into the skin 3 (three) times daily before meals. Per sliding scale: 0-150=0 units, 151-200 = 2 units, 201-250 = 4 units, 251-300 = 6 units, 301-350 = 8 units, 351-400 = 10 units, 401-450 = 12 units, > 450 CALL MD.     lansoprazole 30 MG capsule  Commonly known as:  PREVACID  Take 30 mg  by mouth daily at 12 noon.     lanthanum 500 MG chewable tablet  Commonly known as:  FOSRENOL  Chew 500 mg by mouth 3 (three) times daily with meals.     OSTEO BI-FLEX TRIPLE STRENGTH PO  Take 1 tablet by mouth daily.     phenol 1.4 % Liqd  Commonly known as:  CHLORASEPTIC  Use as directed 1 spray in the mouth or throat 3 (three) times daily as needed for throat irritation /  pain.     polyethylene glycol packet  Commonly known as:  MIRALAX / GLYCOLAX  Take 17 g by mouth at bedtime.     sodium bicarbonate 650 MG tablet  Take 1 tablet (650 mg total) by mouth daily.     traMADol 50 MG tablet  Commonly known as:  ULTRAM  Take 1 tablet (50 mg total) by mouth every 6 (six) hours as needed (pain).       No Known Allergies    The results of significant diagnostics from this hospitalization (including imaging, microbiology, ancillary and laboratory) are listed below for reference.    Significant Diagnostic Studies: Dg Chest 2 View  11/07/2013   CLINICAL DATA:  Productive cough.  EXAM: CHEST  2 VIEW  COMPARISON:  DG CHEST PORTABLE dated 11/07/2013; DG CHEST 1V PORT dated 10/11/2013; DG CHEST 1V PORT dated 10/11/2013; DG CHEST 1V PORT dated 10/07/2013; DG CHEST 2 VIEW dated 07/07/2012  FINDINGS: Cardiomegaly with mild pulmonary vascular prominence noted. Diffuse interstitial prominence noted. Bilateral pleural effusions are present. These findings are consistent with congestive heart failure. Superimposed pneumonitis cannot be excluded . No pleural effusion or pneumothorax. There are multiple mild thoracic compressions noted. This may be from severe osteopenia present.  IMPRESSION: Findings consistent with congestive heart failure with pulmonary edema. Superimposed pneumonitis cannot be excluded.   Electronically Signed   By: Marcello Moores  Register   On: 11/07/2013 18:20    Microbiology: Recent Results (from the past 240 hour(s))  MRSA PCR SCREENING     Status: None   Collection Time    11/07/13   8:03 AM      Result Value Ref Range Status   MRSA by PCR NEGATIVE  NEGATIVE Final   Comment:            The GeneXpert MRSA Assay (FDA     approved for NASAL specimens     only), is one component of a     comprehensive MRSA colonization     surveillance program. It is not     intended to diagnose MRSA     infection nor to guide or     monitor treatment for     MRSA infections.  URINE CULTURE     Status: None   Collection Time    11/08/13 11:50 PM      Result Value Ref Range Status   Specimen Description URINE, RANDOM   Final   Special Requests NONE   Final   Culture  Setup Time     Final   Value: 11/09/2013 03:42     Performed at [facility]     Final   Value: >=100,000 COLONIES/ML     Performed at [facility]   Culture     Final   Value: PROTEUS VULGARIS     Performed at [facility]   Report Status 11/11/2013 FINAL   Final   Organism ID, Bacteria PROTEUS VULGARIS   Final     Labs: Basic Metabolic Panel:  Recent Labs Lab 11/07/13 1040 11/07/13 1825 11/08/13 0356 11/09/13 0400 11/10/13 0322  NA 137 136* 138 141 141  K 4.0 4.1 3.5* 3.9 4.1  CL 84* 82* 91* 98 96  CO2 29 30 27 28  32  GLUCOSE 91 134* 94 39* 64*  BUN 110* 109* 60* 25* 32*  CREATININE 12.71* 12.90* 8.44* 4.83* 6.04*  CALCIUM 8.4 8.3* 8.3* 8.6 9.0  PHOS  --   --   --   --  4.8*   Liver Function Tests:  Recent Labs Lab 11/07/13 1825 11/10/13 0322  AST 16  --   ALT 7  --   ALKPHOS 66  --   BILITOT 0.4  --   PROT 6.7  --   ALBUMIN 3.0* 2.8*   No results found for this basename: LIPASE, AMYLASE,  in the last 168 hours No results found for this basename: AMMONIA,  in the last 168 hours CBC:  Recent Labs Lab 11/07/13 1040 11/08/13 0356 11/09/13 0400 11/10/13 0322  WBC 9.4 8.6 8.4 9.0  HGB 8.4* 8.9* 8.6* 9.5*  HCT 25.4* 27.7* 27.2* 30.9*  MCV 95.1 97.5 100.7* 100.3*  PLT 214 221 220 252   Cardiac Enzymes: No results found for this basename:  CKTOTAL, CKMB, CKMBINDEX, TROPONINI,  in the last 168 hours BNP: BNP (last 3 results)  Recent Labs  10/09/13 1134 10/12/13 0611  PROBNP 22117.0* 31197.0*   CBG:  Recent Labs Lab 11/10/13 1242 11/10/13 1602 11/10/13 2111 11/11/13 0606 11/11/13 1123  GLUCAP 81 112* 154* 118* 137*       Signed:  Velvet Bathe  [facility] 11/11/2013, 1:48 PM

## 2013-11-15 ENCOUNTER — Ambulatory Visit: Payer: Medicare Other | Admitting: Physician Assistant

## 2013-11-23 ENCOUNTER — Ambulatory Visit: Payer: Medicare Other | Admitting: Physician Assistant

## 2013-11-29 ENCOUNTER — Encounter: Payer: Self-pay | Admitting: Physician Assistant

## 2013-11-29 ENCOUNTER — Telehealth: Payer: Self-pay | Admitting: Hematology & Oncology

## 2013-11-29 ENCOUNTER — Ambulatory Visit (INDEPENDENT_AMBULATORY_CARE_PROVIDER_SITE_OTHER): Payer: Medicare Other | Admitting: Physician Assistant

## 2013-11-29 VITALS — BP 115/56 | HR 84 | Ht 61.0 in | Wt 177.0 lb

## 2013-11-29 DIAGNOSIS — I421 Obstructive hypertrophic cardiomyopathy: Secondary | ICD-10-CM

## 2013-11-29 DIAGNOSIS — I5032 Chronic diastolic (congestive) heart failure: Secondary | ICD-10-CM

## 2013-11-29 DIAGNOSIS — E7800 Pure hypercholesterolemia, unspecified: Secondary | ICD-10-CM

## 2013-11-29 DIAGNOSIS — I10 Essential (primary) hypertension: Secondary | ICD-10-CM

## 2013-11-29 DIAGNOSIS — N186 End stage renal disease: Secondary | ICD-10-CM

## 2013-11-29 MED ORDER — METOPROLOL SUCCINATE ER 25 MG PO TB24: 12.5000 mg | ORAL_TABLET | Freq: Every day | ORAL | Status: DC

## 2013-11-29 NOTE — Telephone Encounter (Signed)
Left message moved 3-17 time

## 2013-11-29 NOTE — Progress Notes (Signed)
[place], [place], [place]  [address] Phone: [phone] Fax:  [phone]  Date:  11/29/2013   ID:  [name] [name], DOB 23-Jul-1926, MRN [id number]  PCP:  Ann Held, MD  Cardiologist:  Dr. Minus Breeding     History of Present Illness: [name] [name] is a 80 y.o. female with a hx of abnormal EKG and mild non-obstructive CAD on cath in 2008, HOCM, HTN, HL, DM, CKD, anemia of chronic disease.  Patient was admitted 1/10-1/23. She initially presented with acute on chronic renal failure in the setting of rhabdomyolysis and severe hypoglycemia after sustaining a fall at home. She had recently suffered a compression fracture and underwent kyphoplasty.  Hospital course was complicated by aspiration pneumonitis as well as acute pulmonary edema. She was seen by cardiology.  Echocardiogram demonstrated normal LV function and dynamic outflow tract obstruction consistent with HOCM. There was also mild mitral stenosis. She had elevated troponins consistent with demand ischemia. She was diuresed. Patient was readmitted 2/16-2/20 with pulmonary edema in the setting of acute on chronic renal failure. Creatinine was 12. She was started on hemodialysis.  She is now staying at [facility] in [place].  Patient goes to dialysis on Monday, Wednesday, Friday. Some dialysis sessions have been cut short due to hypotension.  She denies significant dyspnea. She denies chest pain. She denies syncope. She denies orthopnea or edema. She does feel tired.  [place] (11/2006):  mLAD 30, oD1 25, oD2 40, pRCA 25, oPDA 30. Echo (05/2011):  EF 70%, LVOT gradient (4 m/sec), +SAM. Echocardiogram (10/06/13): Moderate LVH, vigorous LVF, EF 65-70%, dynamic LV obstruction (peak velocity 357 cm/s, peak gradient 51 mmHg), no RWMA, Gr 1 DD, moderate MAC, mild mitral stenosis (mean gradient 8 mmHg), mild LAE, PASP 56.  Recent Labs: 10/12/2013: Pro B Natriuretic peptide (BNP) 31197.0*  11/07/2013: ALT 7  11/10/2013:  Creatinine 6.04*; Hemoglobin 9.5*; Potassium 4.1   Wt Readings from Last 3 Encounters:  11/29/13 177 lb (80.287 kg)  11/11/13 171 lb 4.8 oz (77.7 kg)  10/14/13 196 lb 3.4 oz (89 kg)     Past Medical History  Diagnosis Date  . Diabetes mellitus     mellitus? x25 years  . HTN (hypertension)     x2 years  . Hypercholesterolemia     x 2 years  . Renal insufficiency     mild to moderate  . Anemia     chronic anemia due to chronic disease  . CAD (coronary artery disease)     nonobstructive   . GERD (gastroesophageal reflux disease)     Current Outpatient Prescriptions  Medication Sig Dispense Refill  . amitriptyline (ELAVIL) 10 MG tablet Take 10 mg by mouth at bedtime.       Marland Kitchen aspirin EC 81 MG tablet Take 81 mg by mouth daily.      Marland Kitchen atorvastatin (LIPITOR) 20 MG tablet Take 1 tablet (20 mg total) by mouth daily.  30 tablet  0  . calcitRIOL (ROCALTROL) 0.25 MCG capsule Take 1 capsule (0.25 mcg total) by mouth daily.  30 capsule  0  . carvedilol (COREG) 3.125 MG tablet Take 1 tablet (3.125 mg total) by mouth 2 (two) times daily with a meal.  30 tablet  0  . cholecalciferol (VITAMIN D) 1000 UNITS tablet Take 1,000 Units by mouth daily.      . cyanocobalamin (,VITAMIN B-12,) 1000 MCG/ML injection Inject 1,000 mcg into the muscle every 30 (thirty) days.      Marland Kitchen  diazepam (VALIUM) 5 MG tablet Take 1 tablet (5 mg total) by mouth every 12 (twelve) hours as needed for anxiety.  10 tablet  0  . febuxostat (ULORIC) 40 MG tablet Take 40 mg by mouth at bedtime.      . feeding supplement, GLUCERNA SHAKE, (GLUCERNA SHAKE) LIQD Take 237 mLs by mouth 2 (two) times daily between meals.  30 Can  0  . fish oil-omega-3 fatty acids 1000 MG capsule Take 1 g by mouth daily.       . Glucosamine-Chondroitin 250-200 MG CAPS Take 1 capsule by mouth daily.        . insulin regular (NOVOLIN R,HUMULIN R) 100 units/mL injection Inject 0-12 Units into the skin 3 (three) times daily before meals. Per sliding scale:  0-150=0 units, 151-200 = 2 units, 201-250 = 4 units, 251-300 = 6 units, 301-350 = 8 units, 351-400 = 10 units, 401-450 = 12 units, > 450 CALL MD.      . lansoprazole (PREVACID) 30 MG capsule Take 30 mg by mouth daily at 12 noon.      Marland Kitchen lanthanum (FOSRENOL) 500 MG chewable tablet Chew 500 mg by mouth 3 (three) times daily with meals.      . Misc Natural Products (OSTEO BI-FLEX TRIPLE STRENGTH PO) Take 1 tablet by mouth daily.      . OxyCODONE HCl, Abuse Deter, 5 MG TABA Take 5 mg by mouth every 4 (four) hours.      . phenol (CHLORASEPTIC) 1.4 % LIQD Use as directed 1 spray in the mouth or throat 3 (three) times daily as needed for throat irritation / pain.      . polyethylene glycol (MIRALAX / GLYCOLAX) packet Take 17 g by mouth at bedtime.      . sodium bicarbonate 650 MG tablet Take 1 tablet (650 mg total) by mouth daily.  30 tablet  0  . traMADol (ULTRAM) 50 MG tablet Take 1 tablet (50 mg total) by mouth every 6 (six) hours as needed (pain).  30 tablet  0   No current facility-administered medications for this visit.    Allergies:   Review of patient's allergies indicates no known allergies.   Social History:  The patient  reports that she has never smoked. She has never used smokeless tobacco. She reports that she does not drink alcohol or use illicit drugs.   Family History:  The patient's family history includes Heart attack in her father.   ROS:  Please see the history of present illness.      All other systems reviewed and negative.   PHYSICAL EXAM: VS:  BP 115/56  Pulse 85  Ht 5\' 1"  (1.549 m)  Wt 177 lb (80.287 kg)  BMI 33.46 kg/m2 Well nourished, well developed, in no acute distress HEENT: normal Neck: no JVDat 90 Cardiac:  normal S1, S2; RRR; 2/6 systolic murmuralong the LSB Lungs:  clear to auscultation bilaterally, no wheezing, rhonchi or rales Abd: soft, nontender, no hepatomegaly Ext:  Trace bilateral ankle edema Skin: warm and dry Neuro:  CNs 2-12 intact, no focal  abnormalities noted  EKG:  NSR, HR 84, normal axis, LVH with repolarization abnormality, no change from prior tracing     ASSESSMENT AND PLAN:  1. Diastolic CHF: Volume managed by hemodialysis. 2. HOCM: Continue beta blocker. Recent blood pressures have been low in the setting of dialysis. DC carvedilol. Start Toprol-XL 12.5 mg daily. 3. Hypertension: Adjust medications as noted above. 4. CAD: Minimal by cardiac catheterization in  2008. No symptoms of angina. Continue aspirin, statin. 5. Hyperlipidemia: Continue statin. 6. ESRD: She remains on Monday, Wednesday, Friday dialysis. 7. Disposition: Follow up with Dr. Percival Spanish in 2 months. 8.   Signed, Richardson Dopp, PA-C  11/29/2013 4:07 PM

## 2013-11-29 NOTE — Patient Instructions (Signed)
Your physician has recommended you make the following change in your medication:  1) STOP Coreg 2) START Metoprolol Succinate 12.5mg  (1/2 tablet daily) Take all other medication as prescribed  You have a follow up appt scheduled with Dr.Hochrein for 01/31/14 @12pm 

## 2013-12-02 ENCOUNTER — Telehealth: Payer: Self-pay | Admitting: Hematology & Oncology

## 2013-12-02 NOTE — Telephone Encounter (Signed)
We had a pt cx today. Per Dr. Marin Olp I called and left message to see if they would like to come in today.

## 2013-12-02 NOTE — Telephone Encounter (Signed)
[name] [name] called said they would come to appointment at 130pm. I asked her to come at 1 pm to check in and get lab. She is very hateful and rude over the phone. Yesterday I transferred call to [facility], she was suppose to call back and confirm time change and didn't. I told her to be here at 130 pm Tuesday. She hung up on me. She called yesterday saying that we randomly changed the time of Tuesday appointment and was very rude and condescending,saying the MD shouldn't take the day off. I transferred call to [facility]

## 2013-12-05 ENCOUNTER — Telehealth: Payer: Self-pay | Admitting: Hematology & Oncology

## 2013-12-05 NOTE — Telephone Encounter (Signed)
Tried calling NEW PATIENT today to remind them of their appointment with Dr. Marin Olp. Also, advised them to bring all meds and insurance information. However, phone listed in Epic appears to be disconnected.

## 2013-12-06 ENCOUNTER — Other Ambulatory Visit: Payer: Medicare Other | Admitting: Lab

## 2013-12-06 ENCOUNTER — Ambulatory Visit: Payer: Medicare Other

## 2013-12-06 ENCOUNTER — Ambulatory Visit (HOSPITAL_BASED_OUTPATIENT_CLINIC_OR_DEPARTMENT_OTHER): Payer: Medicare Other | Admitting: Lab

## 2013-12-06 ENCOUNTER — Ambulatory Visit: Payer: Medicare Other | Admitting: Hematology & Oncology

## 2013-12-06 ENCOUNTER — Ambulatory Visit (HOSPITAL_BASED_OUTPATIENT_CLINIC_OR_DEPARTMENT_OTHER): Payer: Medicare Other | Admitting: Hematology & Oncology

## 2013-12-06 ENCOUNTER — Ambulatory Visit (HOSPITAL_BASED_OUTPATIENT_CLINIC_OR_DEPARTMENT_OTHER)
Admission: RE | Admit: 2013-12-06 | Discharge: 2013-12-06 | Disposition: A | Discharge status: Home / Self Care | Payer: Medicare Other | Source: Ambulatory Visit | Attending: Hematology & Oncology | Admitting: Hematology & Oncology

## 2013-12-06 VITALS — BP 130/44 | HR 72 | Temp 98.4°F | Resp 20 | Ht 61.0 in | Wt 170.0 lb

## 2013-12-06 DIAGNOSIS — D649 Anemia, unspecified: Secondary | ICD-10-CM

## 2013-12-06 DIAGNOSIS — M899 Disorder of bone, unspecified: Secondary | ICD-10-CM | POA: Insufficient documentation

## 2013-12-06 DIAGNOSIS — Z992 Dependence on renal dialysis: Secondary | ICD-10-CM

## 2013-12-06 DIAGNOSIS — N189 Chronic kidney disease, unspecified: Secondary | ICD-10-CM

## 2013-12-06 DIAGNOSIS — I517 Cardiomegaly: Secondary | ICD-10-CM | POA: Insufficient documentation

## 2013-12-06 DIAGNOSIS — I7090 Unspecified atherosclerosis: Secondary | ICD-10-CM | POA: Diagnosis not present

## 2013-12-06 DIAGNOSIS — N184 Chronic kidney disease, stage 4 (severe): Secondary | ICD-10-CM

## 2013-12-06 DIAGNOSIS — D539 Nutritional anemia, unspecified: Secondary | ICD-10-CM

## 2013-12-06 DIAGNOSIS — J90 Pleural effusion, not elsewhere classified: Secondary | ICD-10-CM | POA: Insufficient documentation

## 2013-12-06 DIAGNOSIS — E119 Type 2 diabetes mellitus without complications: Secondary | ICD-10-CM

## 2013-12-06 DIAGNOSIS — D472 Monoclonal gammopathy: Secondary | ICD-10-CM

## 2013-12-06 DIAGNOSIS — M949 Disorder of cartilage, unspecified: Principal | ICD-10-CM

## 2013-12-06 LAB — CMP (CANCER CENTER ONLY)
ALT: 12 U/L (ref 10–47)
AST: 15 U/L (ref 11–38)
Albumin: 2.7 g/dL — ABNORMAL LOW (ref 3.3–5.5)
Alkaline Phosphatase: 133 U/L — ABNORMAL HIGH (ref 26–84)
BILIRUBIN TOTAL: 0.6 mg/dL (ref 0.20–1.60)
BUN, Bld: 10 mg/dL (ref 7–22)
CALCIUM: 9.8 mg/dL (ref 8.0–10.3)
CHLORIDE: 93 meq/L — AB (ref 98–108)
CO2: 35 meq/L — AB (ref 18–33)
CREATININE: 2.7 mg/dL — AB (ref 0.6–1.2)
GLUCOSE: 312 mg/dL — AB (ref 73–118)
Potassium: 3.5 mEq/L (ref 3.3–4.7)
Sodium: 136 mEq/L (ref 128–145)
Total Protein: 6.1 g/dL — ABNORMAL LOW (ref 6.4–8.1)

## 2013-12-06 LAB — IRON AND TIBC CHCC
%SAT: 22 % (ref 21–57)
IRON: 39 ug/dL — AB (ref 41–142)
TIBC: 176 ug/dL — ABNORMAL LOW (ref 236–444)
UIBC: 137 ug/dL (ref 120–384)

## 2013-12-06 LAB — CHCC SATELLITE - SMEAR

## 2013-12-06 LAB — CBC WITH DIFFERENTIAL (CANCER CENTER ONLY)
BASO#: 0.1 10*3/uL (ref 0.0–0.2)
BASO%: 1.1 % (ref 0.0–2.0)
EOS ABS: 0.6 10*3/uL — AB (ref 0.0–0.5)
EOS%: 7.2 % — ABNORMAL HIGH (ref 0.0–7.0)
HCT: 31.7 % — ABNORMAL LOW (ref 34.8–46.6)
HEMOGLOBIN: 9.6 g/dL — AB (ref 11.6–15.9)
LYMPH#: 2.3 10*3/uL (ref 0.9–3.3)
LYMPH%: 28.9 % (ref 14.0–48.0)
MCH: 31.1 pg (ref 26.0–34.0)
MCHC: 30.3 g/dL — AB (ref 32.0–36.0)
MCV: 103 fL — AB (ref 81–101)
MONO#: 0.6 10*3/uL (ref 0.1–0.9)
MONO%: 7.6 % (ref 0.0–13.0)
NEUT%: 55.2 % (ref 39.6–80.0)
NEUTROS ABS: 4.4 10*3/uL (ref 1.5–6.5)
Platelets: 282 10*3/uL (ref 145–400)
RBC: 3.09 10*6/uL — ABNORMAL LOW (ref 3.70–5.32)
RDW: 13.9 % (ref 11.1–15.7)
WBC: 7.9 10*3/uL (ref 3.9–10.0)

## 2013-12-06 LAB — FERRITIN CHCC: Ferritin: 1470 ng/ml — ABNORMAL HIGH (ref 9–269)

## 2013-12-06 NOTE — Progress Notes (Signed)
Referral MD  Reason for Referral: Monoclonal spike. History of L2 fracture. Chronic renal failure. Anemia.   Chief Complaint  Patient presents with  . Hematology appt.  : I was sent by my Dr.   HPI: [name] [name] is a very charming 80 year old white female. She has chronic renal failure. She was started on dialysis a few weeks ago.  She has had back issues. She has had back surgery previously. Back in January, she apparently misjudged a chair and fell. She sustained a fracture at L2. The MRI that was done question whether she had a "systemic process".  She saw orthopedic surgery. Studies were done. Blood studies showed a monoclonal spike of 0.18 g/dL. She had some urine studies done. I don't think it was a 24-hour urine. What was done did not show any light chain in the urine. She had elevated sed rate of 55.  She is chronically anemic. Her hemoglobin 11.8 hematocrit 36.6. Reticulocyte count was normal. She does have chronic renal insufficiency.  She only had a calcium of 9.1. Total protein 6.5 with an albumin of 3.8. On a serum immunofixation, a IgG Kappa protein was noted. However, she had normalvaluesll of her immunoglobulins.  She was, referred to [place] and to have an evaluation.  She's been hospitalized. She had pneumonia recently. She is in heart failure. A chest x-ray showed osteopenia him with some mild thoracic compressions.  A CT scan of her head done in January did not show a suspicious bony lesions.  She gets around with a walker. She currently in is that an assisted living facility          Past Medical History  Diagnosis Date  . Diabetes mellitus     mellitus? x25 years  . HTN (hypertension)     x2 years  . Hypercholesterolemia     x 2 years  . Renal insufficiency     mild to moderate  . Anemia     chronic anemia due to chronic disease  . CAD (coronary artery disease)     nonobstructive   . GERD (gastroesophageal reflux disease)   :  Past Surgical History   Procedure Laterality Date  . Total knee arthroplasty      L-2006; R-2008   . Appendectomy    . Back surgery      lower back  . Transthoracic echocardiogram  05/2011  . Av fistula placement  07/07/2012    Procedure: ARTERIOVENOUS (AV) FISTULA CREATION;  Surgeon: Mal Misty, MD;  Location: [facility] OR;  Service: Vascular;  Laterality: Right;  Brachial - cephalic arterivenous fistula  . Esophagogastroduodenoscopy (egd) with esophageal dilation N/A 10/12/2013    Procedure: ESOPHAGOGASTRODUODENOSCOPY (EGD) WITH ESOPHAGEAL DILATION;  Surgeon: Lafayette Dragon, MD;  Location: [facility] ENDOSCOPY;  Service: Endoscopy;  Laterality: N/A;  . Savory dilation N/A 10/12/2013    Procedure: SAVORY DILATION;  Surgeon: Lafayette Dragon, MD;  Location: [facility] ENDOSCOPY;  Service: Endoscopy;  Laterality: N/A;  :  Current outpatient prescriptions:amitriptyline (ELAVIL) 10 MG tablet, Take 10 mg by mouth at bedtime. , Disp: , Rfl: ;  aspirin EC 81 MG tablet, Take 81 mg by mouth daily., Disp: , Rfl: ;  atorvastatin (LIPITOR) 20 MG tablet, Take 1 tablet (20 mg total) by mouth daily., Disp: 30 tablet, Rfl: 0;  b complex-vitamin c-folic acid (NEPHRO-VITE) 0.8 MG TABS tablet, Take 1 tablet by mouth at bedtime., Disp: , Rfl:  cyanocobalamin (,VITAMIN B-12,) 1000 MCG/ML injection, Inject 1,000 mcg into the muscle every 30 (  thirty) days., Disp: , Rfl: ;  diazepam (VALIUM) 5 MG tablet, Take 1 tablet (5 mg total) by mouth every 12 (twelve) hours as needed for anxiety., Disp: 10 tablet, Rfl: 0;  febuxostat (ULORIC) 40 MG tablet, Take 40 mg by mouth at bedtime., Disp: , Rfl:  feeding supplement, GLUCERNA SHAKE, (GLUCERNA SHAKE) LIQD, Take 237 mLs by mouth 2 (two) times daily between meals., Disp: 30 Can, Rfl: 0;  fish oil-omega-3 fatty acids 1000 MG capsule, Take 1 g by mouth daily. , Disp: , Rfl: ;  Glucosamine-Chondroitin 250-200 MG CAPS, Take 1 capsule by mouth daily.  , Disp: , Rfl:  insulin regular (NOVOLIN R,HUMULIN R) 100 units/mL injection,  Inject 0-12 Units into the skin 3 (three) times daily before meals. Per sliding scale: 0-150=0 units, 151-200 = 2 units, 201-250 = 4 units, 251-300 = 6 units, 301-350 = 8 units, 351-400 = 10 units, 401-450 = 12 units, > 450 CALL MD., Disp: , Rfl: ;  lansoprazole (PREVACID) 30 MG capsule, Take 30 mg by mouth daily at 12 noon., Disp: , Rfl:  lanthanum (FOSRENOL) 500 MG chewable tablet, Chew 500 mg by mouth 3 (three) times daily with meals., Disp: , Rfl: ;  metoprolol succinate (TOPROL XL) 25 MG 24 hr tablet, Take 0.5 tablets (12.5 mg total) by mouth daily., Disp: 15 tablet, Rfl: 11;  Misc Natural Products (OSTEO BI-FLEX TRIPLE STRENGTH PO), Take 1 tablet by mouth daily., Disp: , Rfl: ;  OxyCODONE HCl, Abuse Deter, 5 MG TABA, Take 5 mg by mouth every 4 (four) hours., Disp: , Rfl:  phenol (CHLORASEPTIC) 1.4 % LIQD, Use as directed 1 spray in the mouth or throat 3 (three) times daily as needed for throat irritation / pain., Disp: , Rfl: ;  polyethylene glycol (MIRALAX / GLYCOLAX) packet, Take 17 g by mouth at bedtime., Disp: , Rfl: ;  traMADol (ULTRAM) 50 MG tablet, Take 1 tablet (50 mg total) by mouth every 6 (six) hours as needed (pain)., Disp: 30 tablet, Rfl: 0 calcitRIOL (ROCALTROL) 0.25 MCG capsule, Take 1 capsule (0.25 mcg total) by mouth daily., Disp: 30 capsule, Rfl: 0;  cholecalciferol (VITAMIN D) 1000 UNITS tablet, Take 1,000 Units by mouth daily., Disp: , Rfl: ;  sodium bicarbonate 650 MG tablet, Take 1 tablet (650 mg total) by mouth daily., Disp: 30 tablet, Rfl: 0:  :  No Known Allergies:  Family History  Problem Relation Age of Onset  . Heart attack Father   :  History   Social History  . Marital Status: Widowed    Spouse Name: N/A    Number of Children: N/A  . Years of Education: N/A   Occupational History  . Not on file.   Social History Main Topics  . Smoking status: Never Smoker   . Smokeless tobacco: Never Used     Comment: no smoking   . Alcohol Use: No  . Drug Use: No  .  Sexual Activity: Not Currently    Birth Control/ Protection: Post-menopausal   Other Topics Concern  . Not on file   Social History Narrative   Widowed. Retired and lives with her daughter.   :  Pertinent items are noted in HPI.  Exam: @IPVITALS @  elderly white female. Her vital signs are stable. Head and neck exam shows no ocular or oral lesions. No adenopathy in her neck. Lungs are clear. Cardiac exam shows a 2/6 systolic ejection murmur. Rate is regular rhythm is regular. Abdomen is soft. She is mildly obese. She  has no hepatosplenomegaly. Extremities shows no clubbing cyanosis or edema. Skin exam no rashes or ecchymoses. Neurological exam no focal deficits.    Recent Labs  12/06/13 1334  WBC 7.9  HGB 9.6*  HCT 31.7*  PLT 282    Recent Labs  12/06/13 1334  NA 136  K 3.5  CL 93*  CO2 35*  GLUCOSE 312*  BUN 10  CREATININE 2.7*  CALCIUM 9.8    Blood smear review: Her blood smear shows no rouleau formation. No nucleated red cells. No teardrop cells. Shows normal white cells. No atypical lymphocytes. Platelets are adequate in number and size.  Pathology: None     Assessment and Plan: [name] [name] is an 80 year old female. She is chronic renal failure. His long-standing diabetic. She is on dialysis.  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 I would not think that his compression fracture that she had in her lumbar spine was myeloma related. One would think that there would be much more evidence on her MRI. I am not sure what the radiologist was saying about a "systemic process". I with a long-standing renal failure with anemia would cause the marrow changes.  I will get a bone survey on her. Again, I would be surprised if she has myelomatous lesions.  I do not see a role for a bone marrow test.  We will try to get a 24-hour urine on her. I am not sure of how accurate this would be.  I just don't that we will have an issue.  We will call her daughter  with results of our studies. I just would be incredibly surprised if we see anything related to her having myeloma.  Given her overall health and her performance status (ECOG 3) I just don't think we need to get aggressive here and start making things difficult for her.  If we do have to get her back, we will.  I spent a good 45 minutes with her and her son. She is very nice. She actually is quite funny.

## 2013-12-08 LAB — ERYTHROPOIETIN: Erythropoietin: 18.8 m[IU]/mL — ABNORMAL HIGH (ref 2.6–18.5)

## 2013-12-08 LAB — PROTEIN ELECTROPHORESIS, SERUM, WITH REFLEX
Albumin ELP: 52.4 % — ABNORMAL LOW (ref 55.8–66.1)
Alpha-1-Globulin: 6.4 % — ABNORMAL HIGH (ref 2.9–4.9)
Alpha-2-Globulin: 16.2 % — ABNORMAL HIGH (ref 7.1–11.8)
BETA GLOBULIN: 4.9 % (ref 4.7–7.2)
Beta 2: 4.9 % (ref 3.2–6.5)
GAMMA GLOBULIN: 15.2 % (ref 11.1–18.8)
Total Protein, Serum Electrophoresis: 5.6 g/dL — ABNORMAL LOW (ref 6.0–8.3)

## 2013-12-08 LAB — KAPPA/LAMBDA LIGHT CHAINS
KAPPA FREE LGHT CHN: 7.68 mg/dL — AB (ref 0.33–1.94)
Kappa:Lambda Ratio: 1.2 (ref 0.26–1.65)
LAMBDA FREE LGHT CHN: 6.4 mg/dL — AB (ref 0.57–2.63)

## 2013-12-08 LAB — RETICULOCYTES (CHCC)
ABS RETIC: 97.3 10*3/uL (ref 19.0–186.0)
RBC.: 3.14 MIL/uL — ABNORMAL LOW (ref 3.87–5.11)
Retic Ct Pct: 3.1 % — ABNORMAL HIGH (ref 0.4–2.3)

## 2013-12-08 LAB — IGG, IGA, IGM
IGM, SERUM: 110 mg/dL (ref 52–322)
IgA: 238 mg/dL (ref 69–380)
IgG (Immunoglobin G), Serum: 813 mg/dL (ref 690–1700)

## 2013-12-08 LAB — LACTATE DEHYDROGENASE: LDH: 184 U/L (ref 94–250)

## 2013-12-08 LAB — IFE INTERPRETATION

## 2013-12-12 ENCOUNTER — Other Ambulatory Visit: Payer: Medicare Other | Admitting: Lab

## 2013-12-14 LAB — UIFE/LIGHT CHAINS/TP QN, 24-HR UR
ALPHA 1 UR: DETECTED — AB
Albumin, U: DETECTED
Alpha 2, Urine: DETECTED — AB
BETA UR: DETECTED — AB
Free Kappa Lt Chains,Ur: 44.6 mg/dL — ABNORMAL HIGH (ref 0.14–2.42)
Free Kappa/Lambda Ratio: 8.61 ratio (ref 2.04–10.37)
Free Lambda Excretion/Day: 20.72 mg/d
Free Lambda Lt Chains,Ur: 5.18 mg/dL — ABNORMAL HIGH (ref 0.02–0.67)
Free Lt Chn Excr Rate: 178.4 mg/d
GAMMA UR: DETECTED — AB
Time: 24 hours
Total Protein, Urine-Ur/day: 276 mg/d — ABNORMAL HIGH (ref 10–140)
Total Protein, Urine: 69.1 mg/dL
Volume, Urine: 400 mL

## 2014-01-31 ENCOUNTER — Ambulatory Visit: Payer: Medicare Other | Admitting: Cardiology

## 2014-03-02 ENCOUNTER — Ambulatory Visit: Payer: Medicare Other | Admitting: Cardiology

## 2014-04-04 ENCOUNTER — Encounter: Payer: Self-pay | Admitting: Cardiology

## 2014-04-04 ENCOUNTER — Ambulatory Visit (INDEPENDENT_AMBULATORY_CARE_PROVIDER_SITE_OTHER): Payer: Medicare Other | Admitting: Cardiology

## 2014-04-04 VITALS — BP 149/60 | HR 69 | Ht 61.0 in | Wt 152.0 lb

## 2014-04-04 DIAGNOSIS — I421 Obstructive hypertrophic cardiomyopathy: Secondary | ICD-10-CM

## 2014-04-04 NOTE — Progress Notes (Signed)
HPI The patient returns to see me after a few years.  She recently saw Richardson Dopp PAc.  She has an abnormal EKG and mild non-obstructive CAD on cath in 2008 with evidence of HOCM.  She was in the hospital in January with acute on chronic renal failure in the setting of rhabdomyolysis and severe hypoglycemia after sustaining a fall at home. She was seen by [place]. Echocardiogram demonstrated normal LV function and dynamic outflow tract obstruction consistent with HOCM. There was also mild mitral stenosis. She had elevated troponins consistent with demand ischemia. She was diuresed. She was readmitted 2/16-2/20 with pulmonary edema in the setting of acute on chronic renal failure. Creatinine was 12. She was started on hemodialysis. She is now staying at [facility] in [place].   She has had a couple of more falls with another fractured vertebrae and a pelvic fracture. These have not been syncope but had a mechanical fall. She's working with physical therapy and occupational therapy. She seems to tolerate dialysis. At the last visit her blood pressure was running a little bit low and her beta blockers were adjusted. She's not had any of her problems with hypotension since then. She denies any presyncope or syncope. She's had no chest pressure or shortness of breath. She's had no edema. Her  No Known Allergies  Current Outpatient Prescriptions  Medication Sig Dispense Refill  . amitriptyline (ELAVIL) 10 MG tablet Take 10 mg by mouth at bedtime.       Marland Kitchen aspirin EC 81 MG tablet Take 81 mg by mouth daily.      Marland Kitchen atorvastatin (LIPITOR) 20 MG tablet Take 1 tablet (20 mg total) by mouth daily.  30 tablet  0  . b complex-vitamin c-folic acid (NEPHRO-VITE) 0.8 MG TABS tablet Take 1 tablet by mouth at bedtime.      . calcitRIOL (ROCALTROL) 0.25 MCG capsule Take 1 capsule (0.25 mcg total) by mouth daily.  30 capsule  0  . cholecalciferol (VITAMIN D) 1000 UNITS tablet Take 1,000 Units by mouth  daily.      . cyanocobalamin (,VITAMIN B-12,) 1000 MCG/ML injection Inject 1,000 mcg into the muscle every 30 (thirty) days.      . diazepam (VALIUM) 5 MG tablet Take 1 tablet (5 mg total) by mouth every 12 (twelve) hours as needed for anxiety.  10 tablet  0  . febuxostat (ULORIC) 40 MG tablet Take 40 mg by mouth at bedtime.      . feeding supplement, GLUCERNA SHAKE, (GLUCERNA SHAKE) LIQD Take 237 mLs by mouth 2 (two) times daily between meals.  30 Can  0  . fish oil-omega-3 fatty acids 1000 MG capsule Take 1 g by mouth daily.       . Glucosamine-Chondroitin 250-200 MG CAPS Take 1 capsule by mouth daily.        . insulin regular (NOVOLIN R,HUMULIN R) 100 units/mL injection Inject 0-12 Units into the skin 3 (three) times daily before meals. Per sliding scale: 0-150=0 units, 151-200 = 2 units, 201-250 = 4 units, 251-300 = 6 units, 301-350 = 8 units, 351-400 = 10 units, 401-450 = 12 units, > 450 CALL MD.      . lansoprazole (PREVACID) 30 MG capsule Take 30 mg by mouth daily at 12 noon.      Marland Kitchen lanthanum (FOSRENOL) 500 MG chewable tablet Chew 500 mg by mouth 3 (three) times daily with meals.      . metoprolol succinate (TOPROL XL) 25 MG 24  hr tablet Take 0.5 tablets (12.5 mg total) by mouth daily.  15 tablet  11  . Misc Natural Products (OSTEO BI-FLEX TRIPLE STRENGTH PO) Take 1 tablet by mouth daily.      . OxyCODONE HCl, Abuse Deter, 5 MG TABA Take 5 mg by mouth every 4 (four) hours.      . phenol (CHLORASEPTIC) 1.4 % LIQD Use as directed 1 spray in the mouth or throat 3 (three) times daily as needed for throat irritation / pain.      . polyethylene glycol (MIRALAX / GLYCOLAX) packet Take 17 g by mouth at bedtime.      . sodium bicarbonate 650 MG tablet Take 1 tablet (650 mg total) by mouth daily.  30 tablet  0  . traMADol (ULTRAM) 50 MG tablet Take 1 tablet (50 mg total) by mouth every 6 (six) hours as needed (pain).  30 tablet  0   No current facility-administered medications for this visit.     Past Medical History  Diagnosis Date  . Diabetes mellitus     mellitus? x25 years  . HTN (hypertension)     x2 years  . Hypercholesterolemia     x 2 years  . Renal insufficiency     mild to moderate  . Anemia     chronic anemia due to chronic disease  . CAD (coronary artery disease)     nonobstructive   . GERD (gastroesophageal reflux disease)     Past Surgical History  Procedure Laterality Date  . Total knee arthroplasty      L-2006; R-2008   . Appendectomy    . Back surgery      lower back  . Transthoracic echocardiogram  05/2011  . Av fistula placement  07/07/2012    Procedure: ARTERIOVENOUS (AV) FISTULA CREATION;  Surgeon: Mal Misty, MD;  Location: [facility] OR;  Service: Vascular;  Laterality: Right;  Brachial - cephalic arterivenous fistula  . Esophagogastroduodenoscopy (egd) with esophageal dilation N/A 10/12/2013    Procedure: ESOPHAGOGASTRODUODENOSCOPY (EGD) WITH ESOPHAGEAL DILATION;  Surgeon: Lafayette Dragon, MD;  Location: [facility] ENDOSCOPY;  Service: Endoscopy;  Laterality: N/A;  . Savory dilation N/A 10/12/2013    Procedure: SAVORY DILATION;  Surgeon: Lafayette Dragon, MD;  Location: [facility] ENDOSCOPY;  Service: Endoscopy;  Laterality: N/A;    ROS:  Bony pain.  Otherwise as stated in the HPI and negative for all other systems.  PHYSICAL EXAM BP 149/60  Pulse 69  Ht 5\' 1"  (1.549 m)  Wt 152 lb (68.947 kg)  BMI 28.74 kg/m2 GEN:  No distress, frail NECK:  No jugular venous distention at 90 degrees, waveform within normal limits, carotid upstroke brisk and symmetric, no bruits, no thyromegaly LYMPHATICS:  No cervical adenopathy LUNGS:  Clear to auscultation bilaterally BACK:  No CVA tenderness CHEST:  Unremarkable HEART:  S1 and S2 within normal limits, no S3, no S4, no clicks, no rubs, apical systolic murmur increasing slightly with a strain phase of Valsalva, no diastolic murmurs ABD:  Positive bowel sounds normal in frequency in pitch, no bruits, no rebound, no guarding,  unable to assess midline mass or bruit with the patient seated. EXT:  2 plus pulses throughout, moderate edema, no cyanosis no clubbing, right upper arm fistula SKIN:  No rashes no nodules, bruising  EKG:  Sinus rhythm, rate 69, first degree AV block, interventricular conduction delay, possible old anteroseptal infarct, lateral T-wave inversions consistent with ischemia but unchanged from previous and likely represents repolarization.  04/04/2014  ASSESSMENT AND PLAN  Diastolic CHF: Volume managed by hemodialysis.   HOCM:    She seems to having done well with change of beta blocker without any further hypotension. No change in therapy is indicated.  CAD: Minimal by cardiac catheterization in 2008. No symptoms of angina. No further imaging is indicated.  ESRD: She remains on Monday, Wednesday, Friday dialysis. I will defer followup of her labs to pathology.

## 2014-04-04 NOTE — Patient Instructions (Signed)
Your physician recommends that you schedule a follow-up appointment in: one year with Dr. Hochrein  

## 2014-08-27 IMAGING — RF DG ESOPHAGUS
12 of 16 series · 15 of 24 positions shown · non-contrast
Comparison: None.

FLUOROSCOPY TIME:  49 seconds

CLINICAL DATA: Dysphagia

EXAM:
ESOPHOGRAM/BARIUM SWALLOW
TECHNIQUE: Single contrast examination was performed using  thin barium.

[Series 1: run · 1 of 5 slices shown (1 of 12)]
[im 1/5]
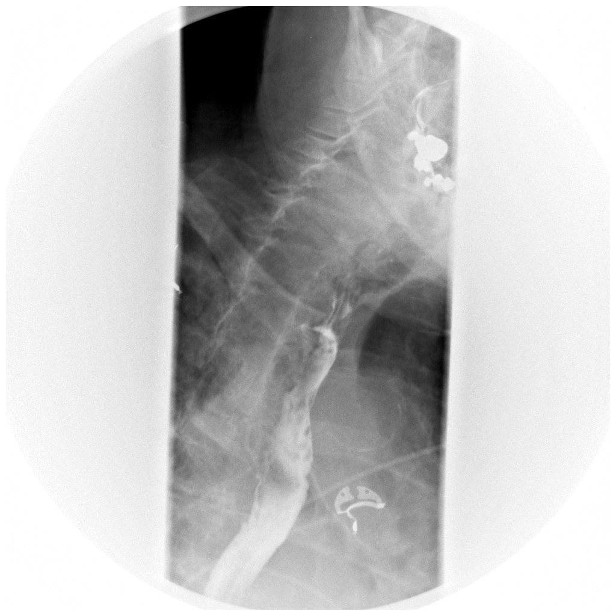

[Series 1: run · 1 of 5 slices shown (2 of 12)]
[im 1/5]
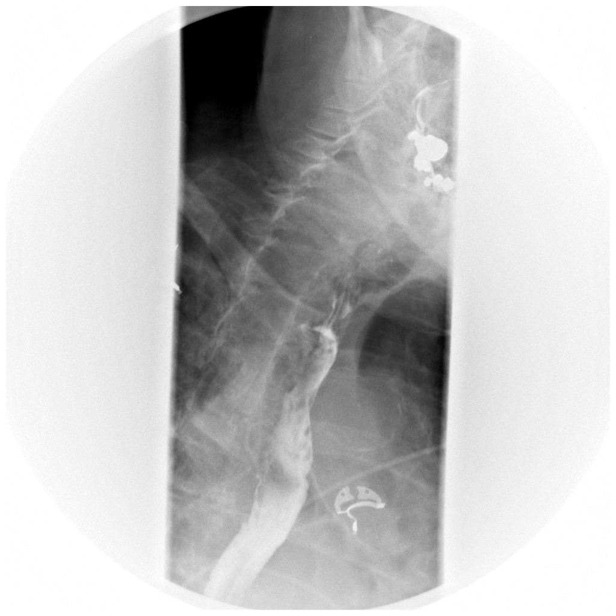

[Series 2: run · 3 of 8 slices shown (3 of 12)]
[im 1/8]
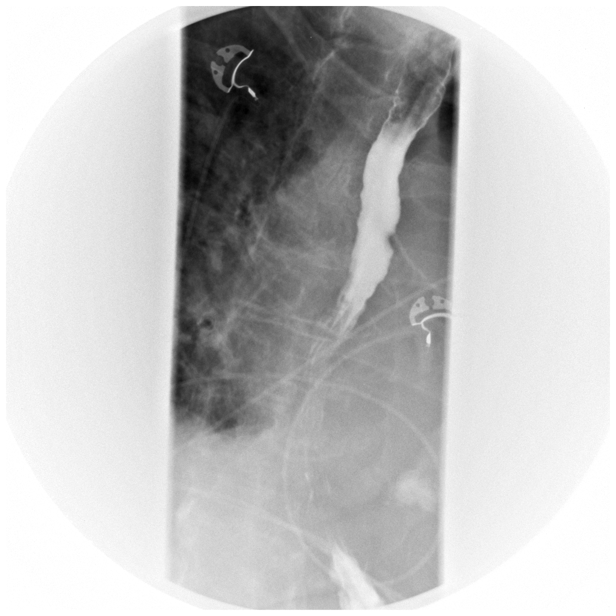
[im 3/8]
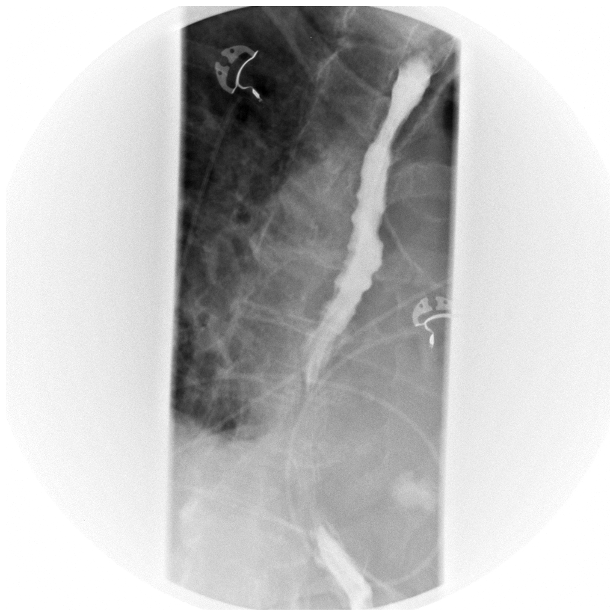
[im 8/8]
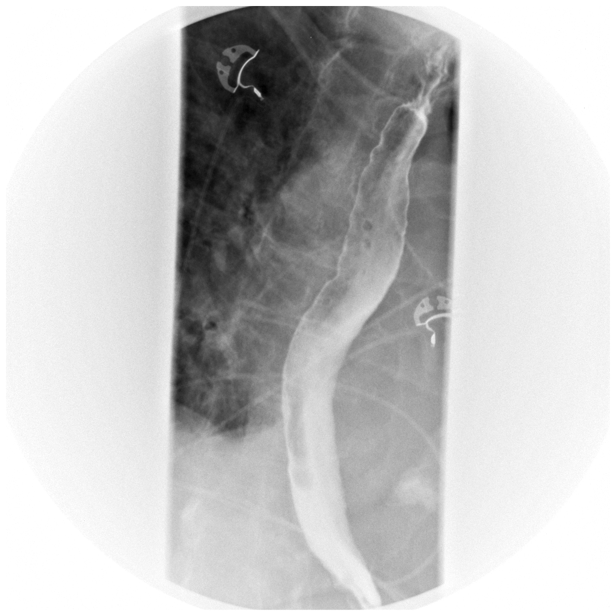

[Series 2: run · 2 of 8 slices shown (4 of 12)]
[im 1/8]
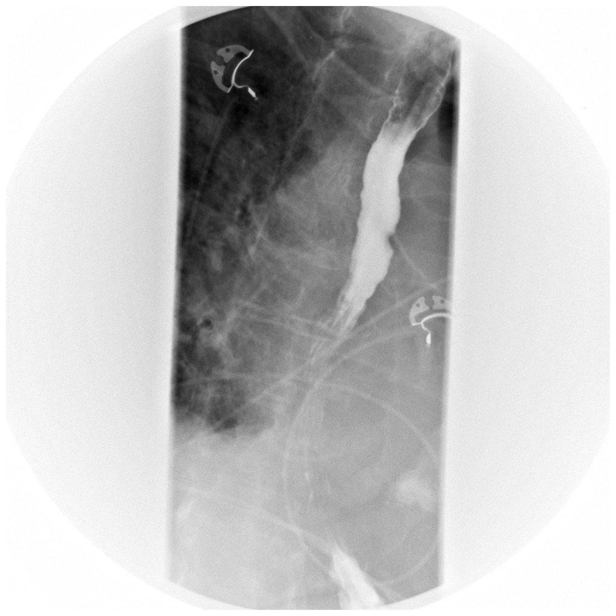
[im 5/8]
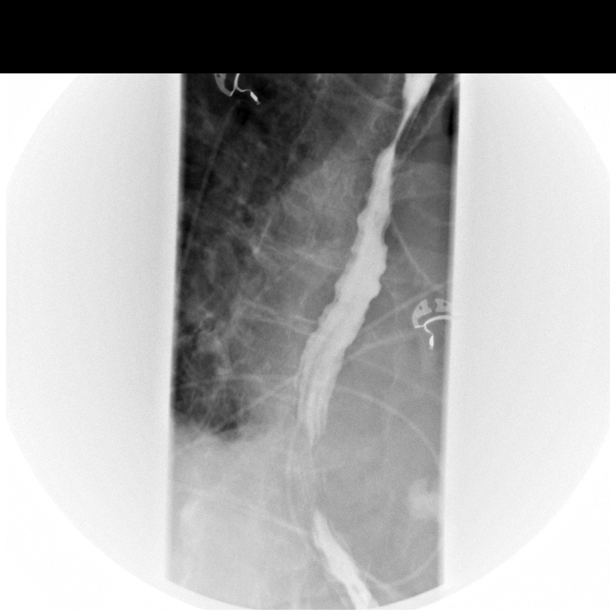

[Series 3: run · 1 of 2 slices shown (5 of 12)]
[im 1/2]
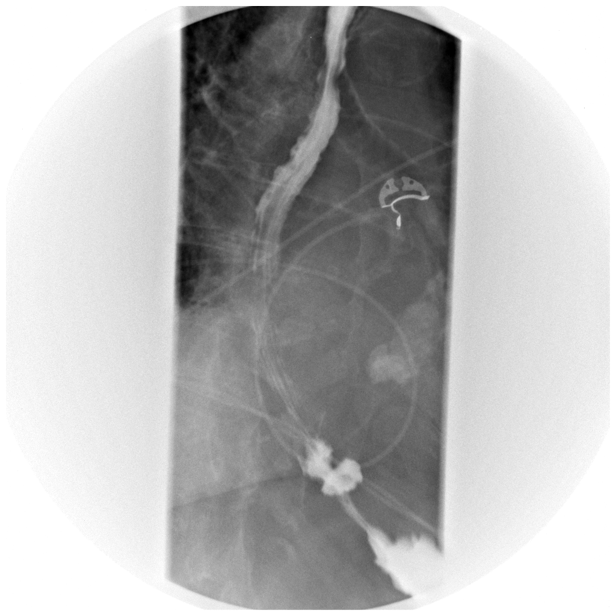

[Series 3: run · 1 of 2 slices shown (6 of 12)]
[im 1/2]
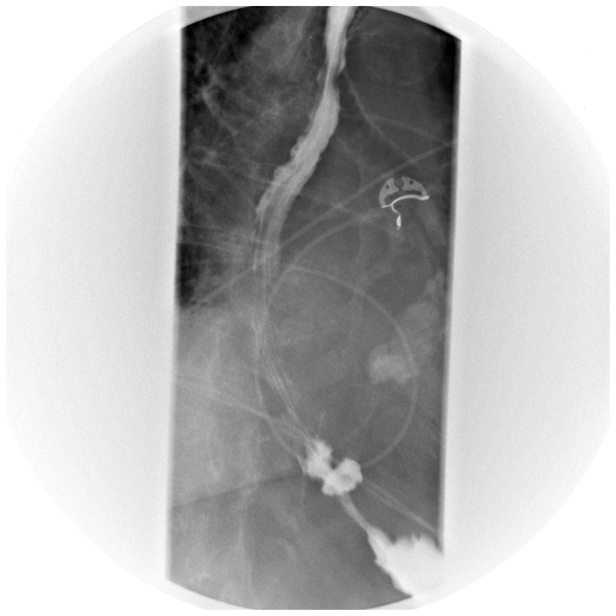

[Series 4: run · 1 of 1 slices shown (7 of 12)]
[im 1/1]
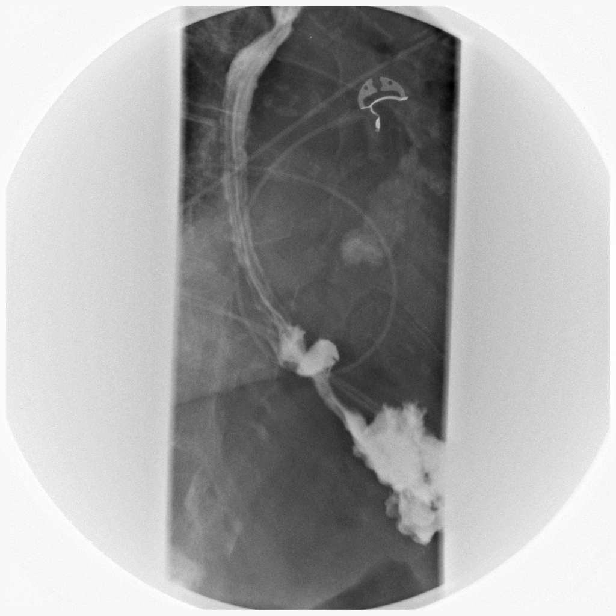

[Series 5: run · 1 of 1 slices shown (8 of 12)]
[im 1/1]
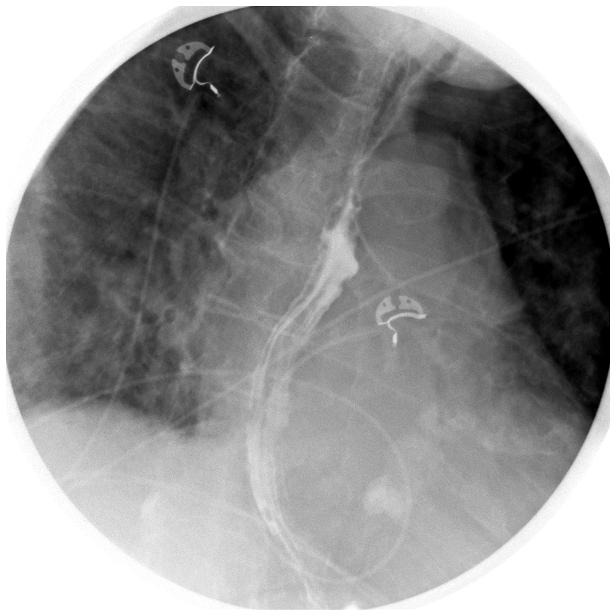

[Series 6: run · 1 of 1 slices shown (9 of 12)]
[im 1/1]
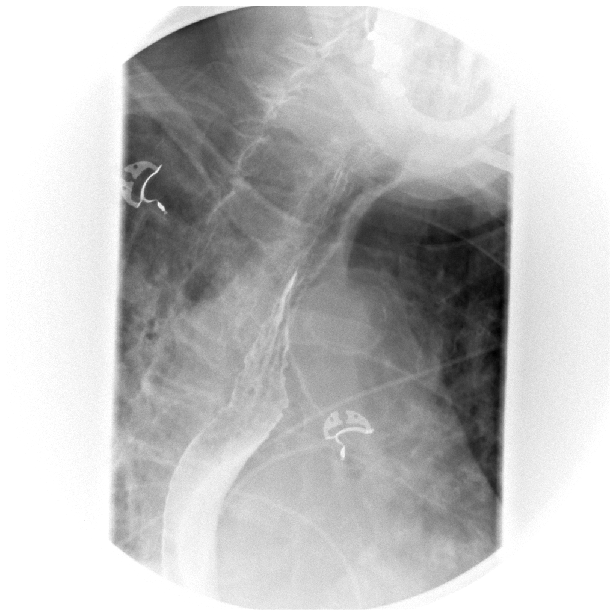

[Series 7: run · 1 of 1 slices shown (10 of 12)]
[im 1/1]
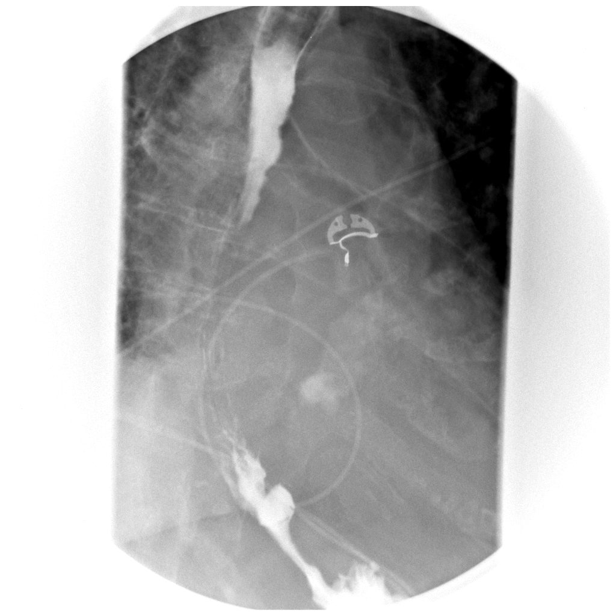

[Series 7: run · 1 of 1 slices shown (11 of 12)]
[im 1/1]
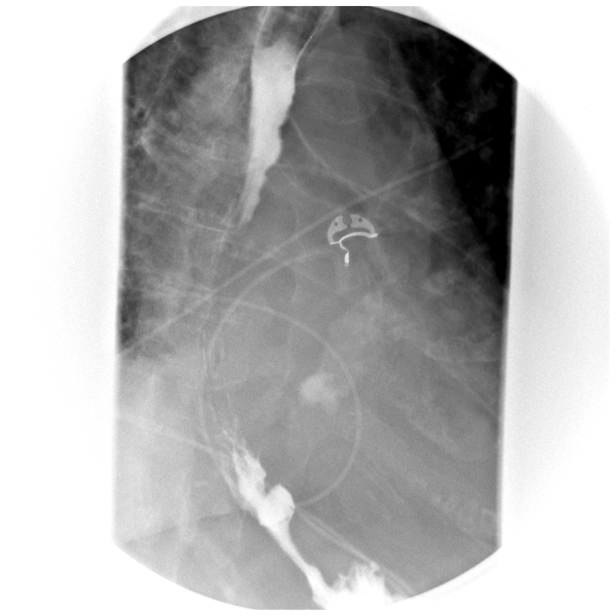

[Series 8: run · 1 of 1 slices shown (12 of 12)]
[im 1/1]
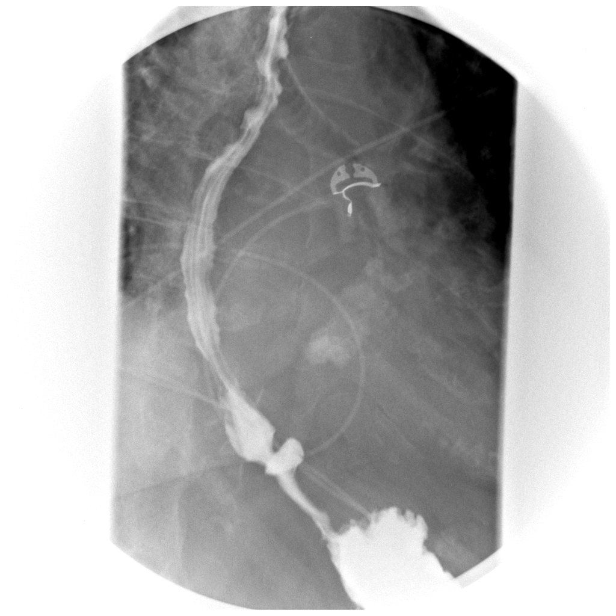

[15 of 24 positions shown; findings below may reference images not displayed]

FINDINGS: Moderate esophageal dysmotility with numerous
non-peristaltic/tertiary contractions.

No definite fixed esophageal narrowing or stricture. However, a
barium tablet was not administered since the patient could not stand
upright.

Small hiatal hernia.

Gastroesophageal reflux could not be assessed due to the patient's
inability to clear her esophagus.
IMPRESSION: Moderate esophageal dysmotility.

No definite fixed esophageal narrowing or stricture.

Small hiatal hernia.

## 2014-08-28 IMAGING — DX DG CHEST 1V PORT
1 series · 1 of 1 positions shown · non-contrast
Comparison: 10/08/2013 and 10/01/2013

CLINICAL DATA: Short of breath.  Difficulty swelling.

EXAM:
PORTABLE CHEST - 1 VIEW

[portable]
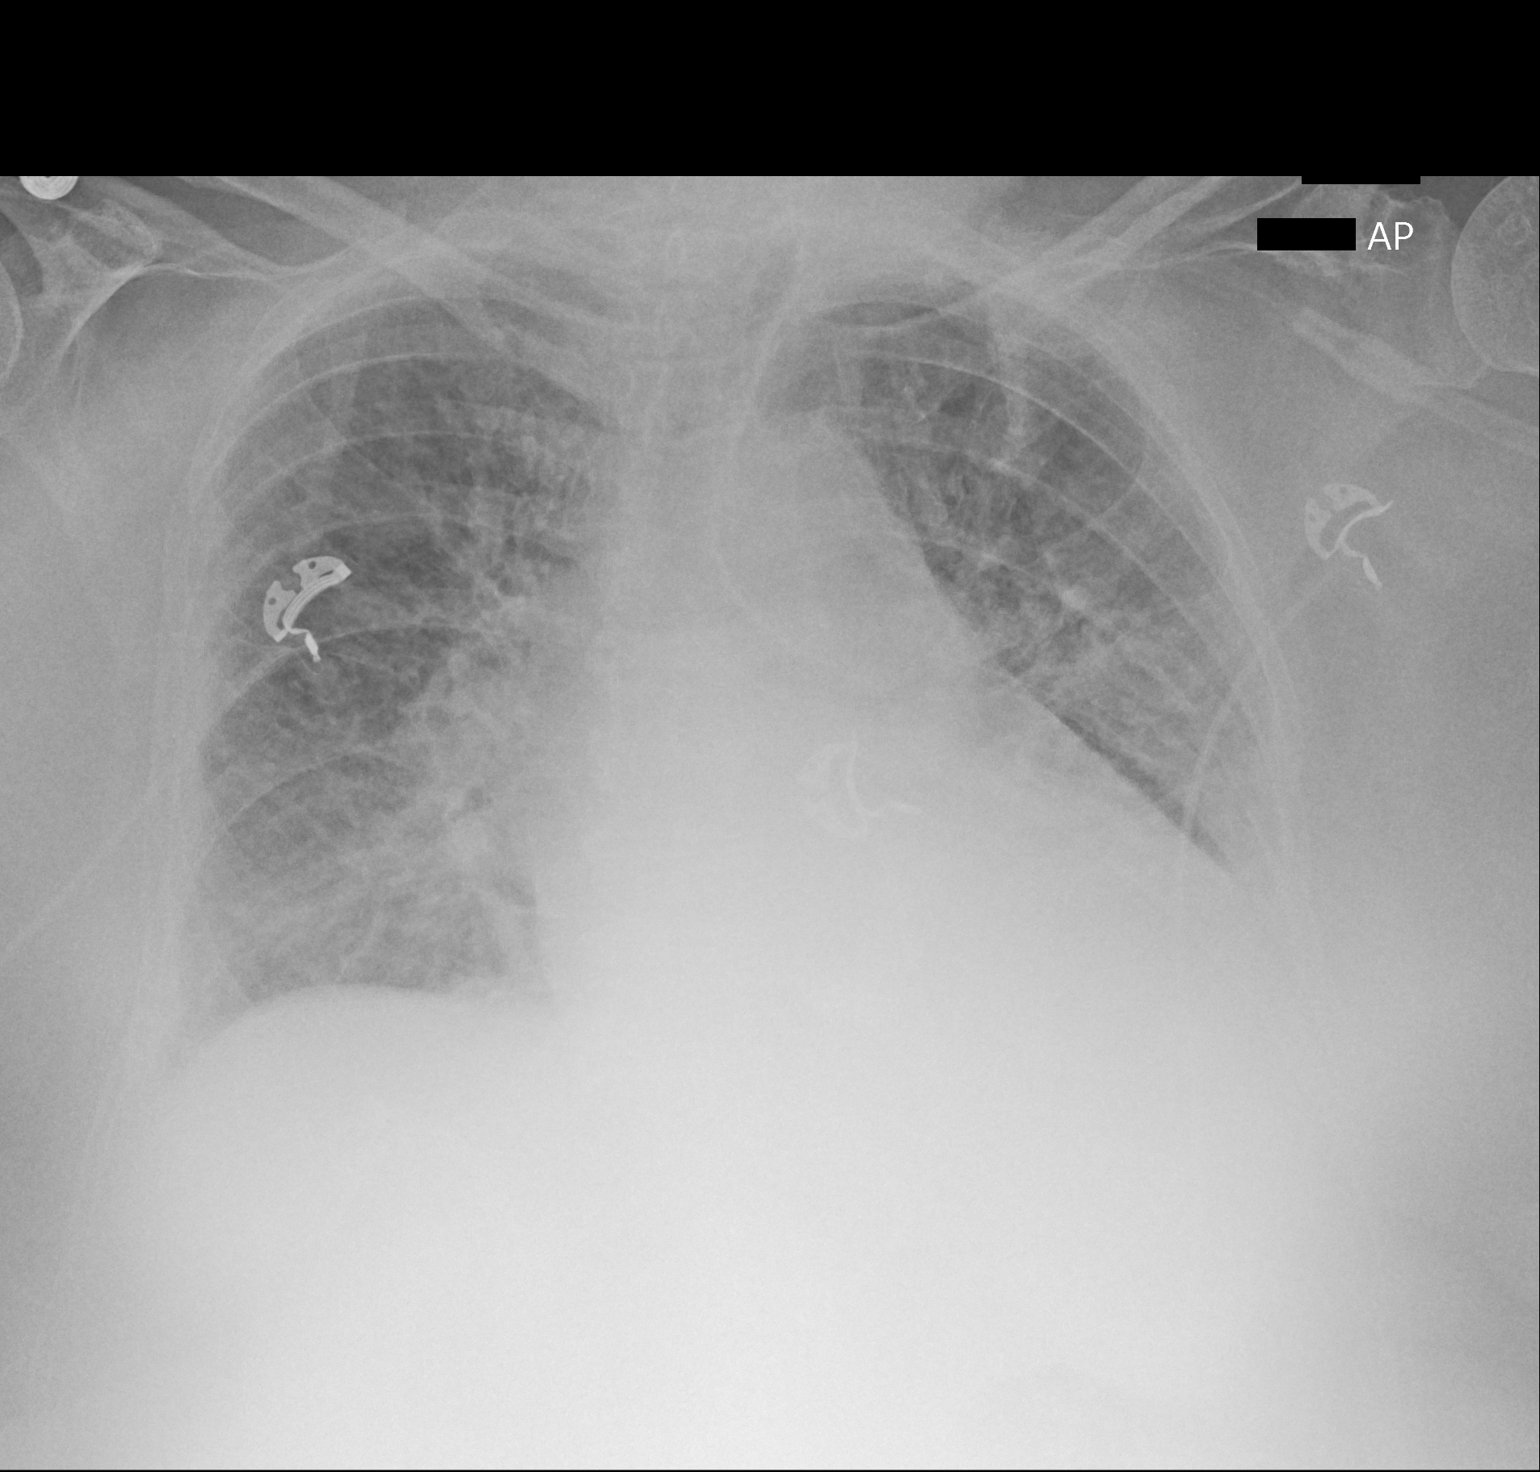

[1 of 1 positions shown; findings below may reference images not displayed]

FINDINGS: There are irregularly thickened interstitial markings bilaterally
suggesting interstitial edema superimposed on chronic interstitial
thickening. There are no areas of focal lung consolidation. No
pleural effusion or pneumothorax is seen.

Cardiac silhouette is top-normal in size. No convincing mediastinal
or hilar masses.
IMPRESSION: Irregular interstitial thickening increased from the prior exam
suggesting interstitial edema superimposed on chronic interstitial
thickening.

## 2014-09-24 IMAGING — CR DG CHEST 2V
2 series · 2 of 2 positions shown · non-contrast
Comparison: DG CHEST PORTABLE dated 11/07/2013;

CLINICAL DATA: Productive cough.

EXAM:
CHEST  2 VIEW

[w chest lat]
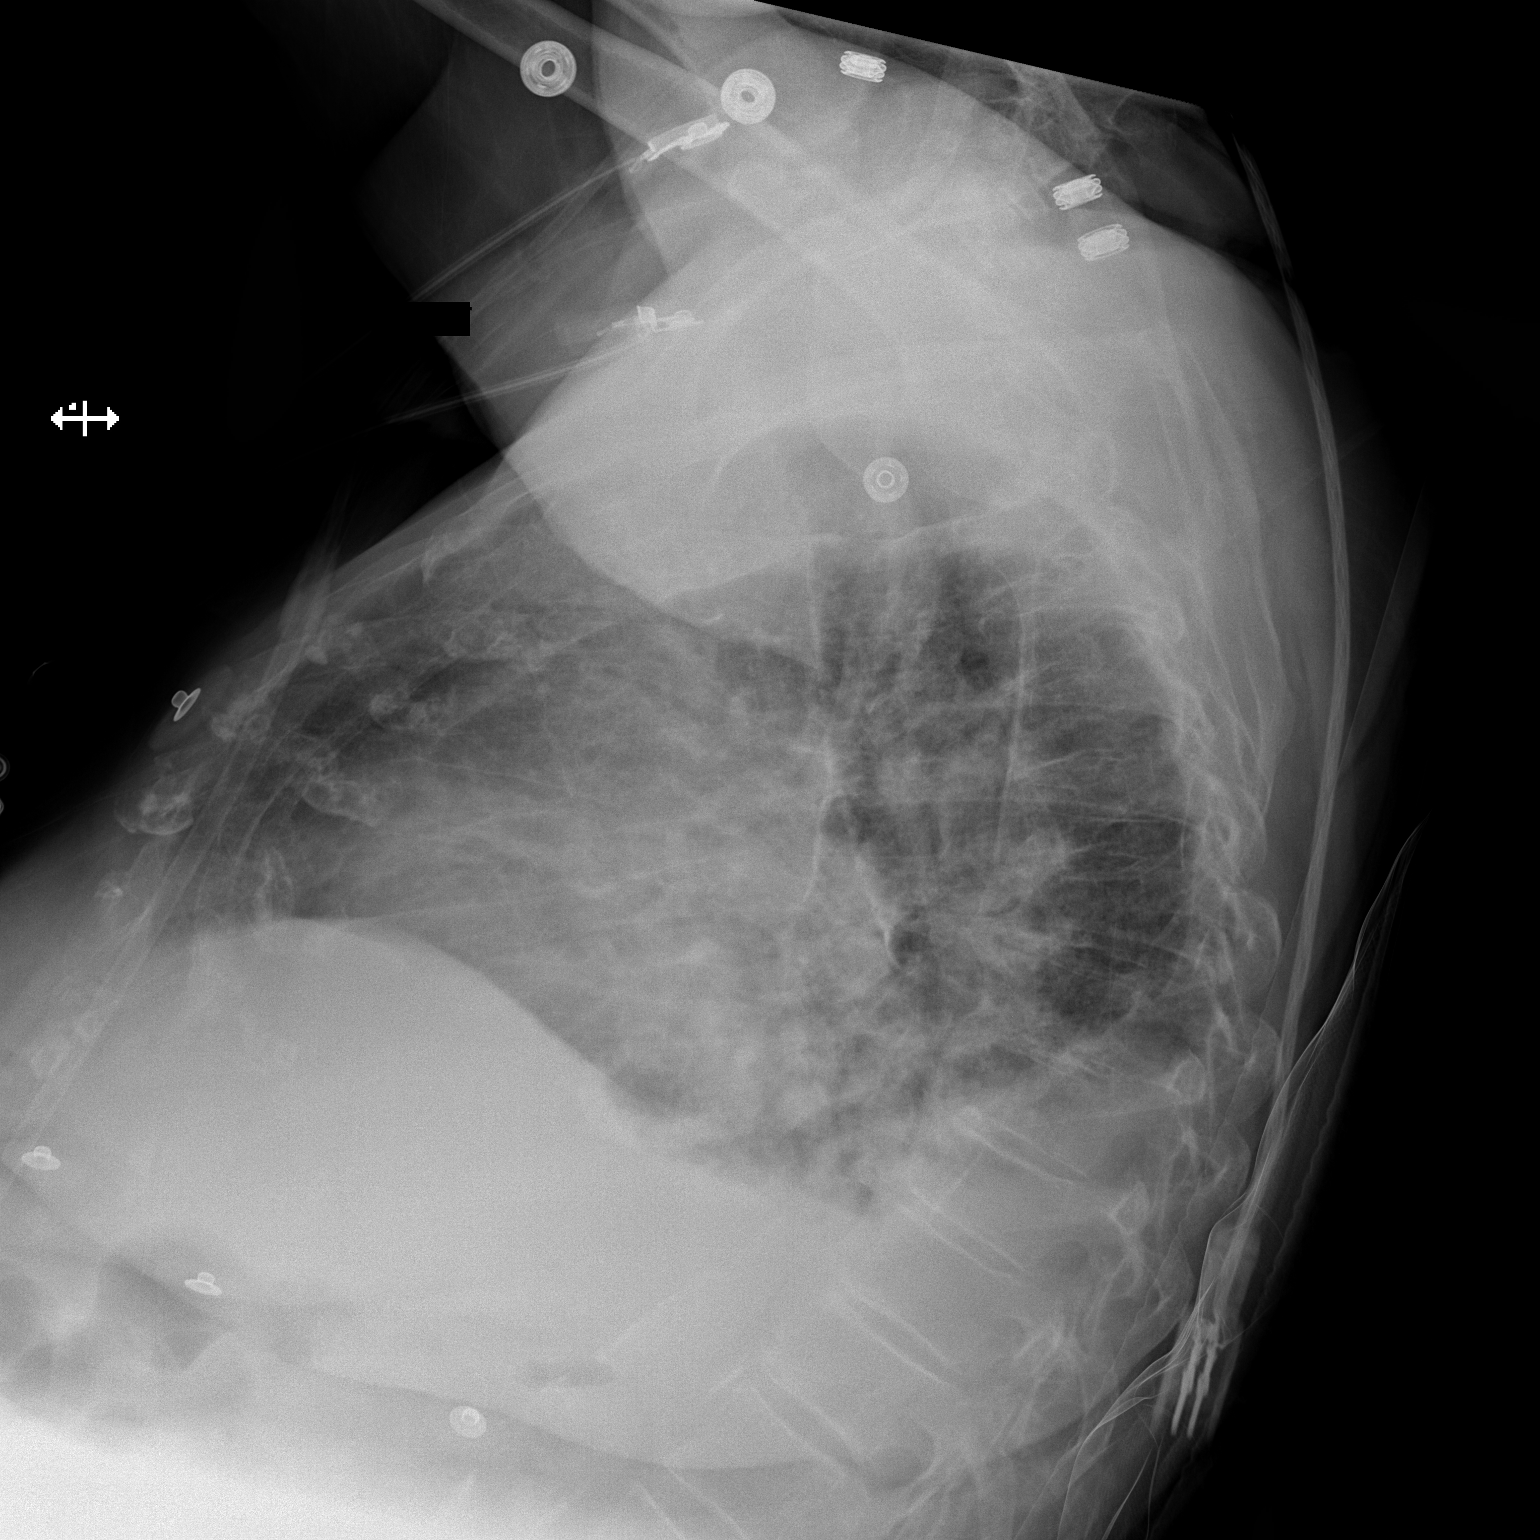

[x chest ap]
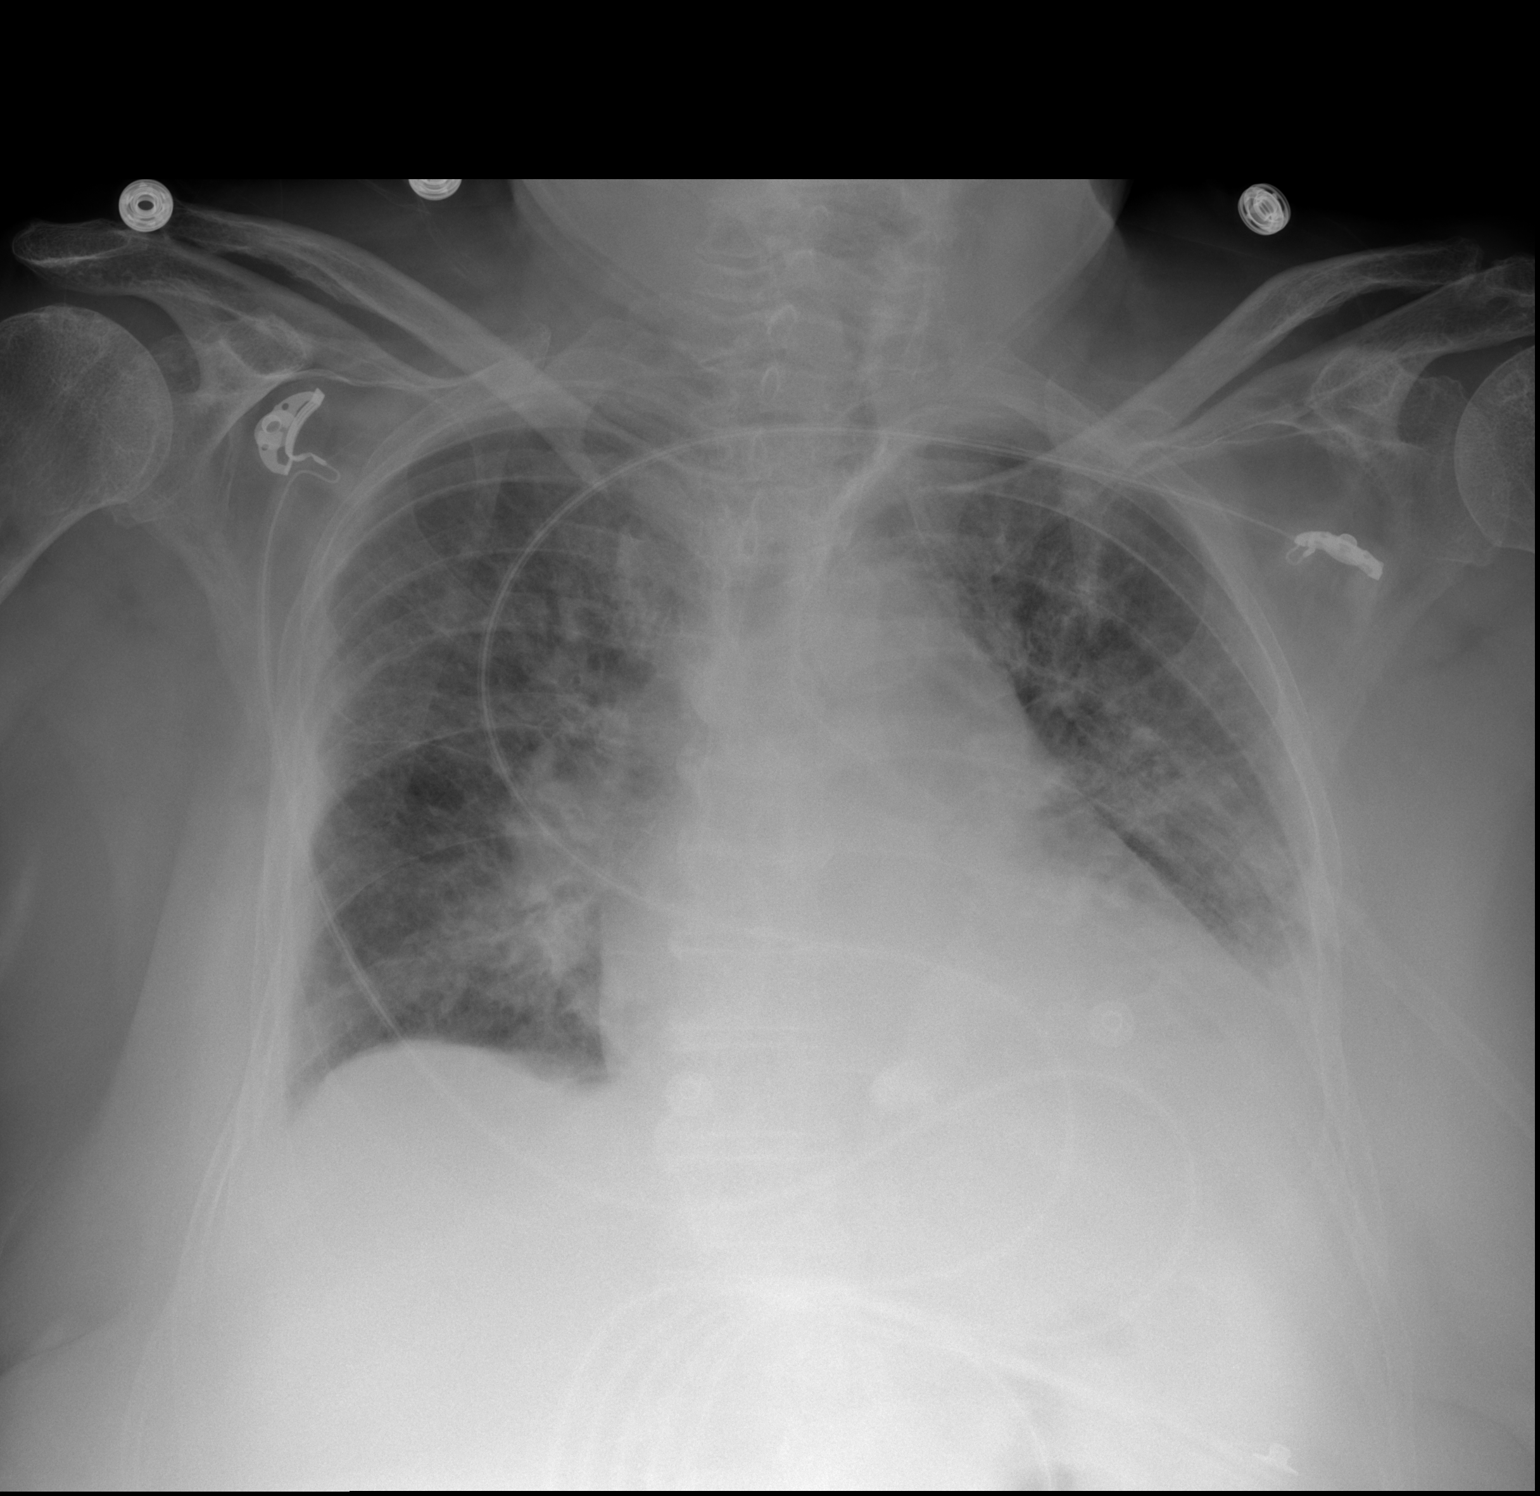

[2 of 2 positions shown; findings below may reference images not displayed]

DG CHEST 1V PORT
dated 10/11/2013; DG CHEST 1V PORT dated 10/11/2013; DG CHEST 1V PORT
dated 10/07/2013; DG CHEST 2 VIEW dated 07/07/2012
FINDINGS: Cardiomegaly with mild pulmonary vascular prominence noted. Diffuse
interstitial prominence noted. Bilateral pleural effusions are
present. These findings are consistent with congestive heart
failure. Superimposed pneumonitis cannot be excluded . No pleural
effusion or pneumothorax. There are multiple mild thoracic
compressions noted. This may be from severe osteopenia present.
IMPRESSION: Findings consistent with congestive heart failure with pulmonary
edema. Superimposed pneumonitis cannot be excluded.

## 2014-12-02 ENCOUNTER — Inpatient Hospital Stay (HOSPITAL_COMMUNITY)
Admission: EM | Admit: 2014-12-02 | Discharge: 2014-12-15 | DRG: 515 | Disposition: A | Discharge status: Skilled Nursing Facility | Payer: Medicare Other | Attending: Family Medicine | Admitting: Family Medicine

## 2014-12-02 ENCOUNTER — Emergency Department (HOSPITAL_COMMUNITY): Payer: Medicare Other

## 2014-12-02 ENCOUNTER — Encounter (HOSPITAL_COMMUNITY): Payer: Self-pay | Admitting: Emergency Medicine

## 2014-12-02 DIAGNOSIS — Z794 Long term (current) use of insulin: Secondary | ICD-10-CM | POA: Diagnosis not present

## 2014-12-02 DIAGNOSIS — E1129 Type 2 diabetes mellitus with other diabetic kidney complication: Secondary | ICD-10-CM | POA: Diagnosis not present

## 2014-12-02 DIAGNOSIS — Z993 Dependence on wheelchair: Secondary | ICD-10-CM | POA: Diagnosis not present

## 2014-12-02 DIAGNOSIS — E780 Pure hypercholesterolemia: Secondary | ICD-10-CM | POA: Diagnosis not present

## 2014-12-02 DIAGNOSIS — I5033 Acute on chronic diastolic (congestive) heart failure: Secondary | ICD-10-CM | POA: Diagnosis not present

## 2014-12-02 DIAGNOSIS — Z79899 Other long term (current) drug therapy: Secondary | ICD-10-CM | POA: Diagnosis not present

## 2014-12-02 DIAGNOSIS — S32050S Wedge compression fracture of fifth lumbar vertebra, sequela: Secondary | ICD-10-CM | POA: Diagnosis not present

## 2014-12-02 DIAGNOSIS — W010XXA Fall on same level from slipping, tripping and stumbling without subsequent striking against object, initial encounter: Secondary | ICD-10-CM | POA: Diagnosis present

## 2014-12-02 DIAGNOSIS — T148 Other injury of unspecified body region: Secondary | ICD-10-CM | POA: Diagnosis not present

## 2014-12-02 DIAGNOSIS — N189 Chronic kidney disease, unspecified: Secondary | ICD-10-CM | POA: Insufficient documentation

## 2014-12-02 DIAGNOSIS — D472 Monoclonal gammopathy: Secondary | ICD-10-CM | POA: Diagnosis not present

## 2014-12-02 DIAGNOSIS — T148XXA Other injury of unspecified body region, initial encounter: Secondary | ICD-10-CM

## 2014-12-02 DIAGNOSIS — I248 Other forms of acute ischemic heart disease: Secondary | ICD-10-CM | POA: Diagnosis present

## 2014-12-02 DIAGNOSIS — M810 Age-related osteoporosis without current pathological fracture: Secondary | ICD-10-CM | POA: Diagnosis present

## 2014-12-02 DIAGNOSIS — I120 Hypertensive chronic kidney disease with stage 5 chronic kidney disease or end stage renal disease: Secondary | ICD-10-CM | POA: Diagnosis not present

## 2014-12-02 DIAGNOSIS — D638 Anemia in other chronic diseases classified elsewhere: Secondary | ICD-10-CM | POA: Diagnosis not present

## 2014-12-02 DIAGNOSIS — Z992 Dependence on renal dialysis: Secondary | ICD-10-CM | POA: Diagnosis not present

## 2014-12-02 DIAGNOSIS — Z7982 Long term (current) use of aspirin: Secondary | ICD-10-CM | POA: Diagnosis not present

## 2014-12-02 DIAGNOSIS — R197 Diarrhea, unspecified: Secondary | ICD-10-CM | POA: Diagnosis not present

## 2014-12-02 DIAGNOSIS — Z96653 Presence of artificial knee joint, bilateral: Secondary | ICD-10-CM | POA: Diagnosis present

## 2014-12-02 DIAGNOSIS — E440 Moderate protein-calorie malnutrition: Secondary | ICD-10-CM | POA: Diagnosis present

## 2014-12-02 DIAGNOSIS — I2510 Atherosclerotic heart disease of native coronary artery without angina pectoris: Secondary | ICD-10-CM | POA: Diagnosis present

## 2014-12-02 DIAGNOSIS — K219 Gastro-esophageal reflux disease without esophagitis: Secondary | ICD-10-CM | POA: Diagnosis not present

## 2014-12-02 DIAGNOSIS — K5900 Constipation, unspecified: Secondary | ICD-10-CM | POA: Insufficient documentation

## 2014-12-02 DIAGNOSIS — K224 Dyskinesia of esophagus: Secondary | ICD-10-CM | POA: Diagnosis present

## 2014-12-02 DIAGNOSIS — W19XXXA Unspecified fall, initial encounter: Secondary | ICD-10-CM | POA: Insufficient documentation

## 2014-12-02 DIAGNOSIS — S32050A Wedge compression fracture of fifth lumbar vertebra, initial encounter for closed fracture: Secondary | ICD-10-CM | POA: Diagnosis present

## 2014-12-02 DIAGNOSIS — I10 Essential (primary) hypertension: Secondary | ICD-10-CM | POA: Diagnosis not present

## 2014-12-02 DIAGNOSIS — R54 Age-related physical debility: Secondary | ICD-10-CM | POA: Diagnosis present

## 2014-12-02 DIAGNOSIS — N186 End stage renal disease: Secondary | ICD-10-CM | POA: Diagnosis present

## 2014-12-02 DIAGNOSIS — Z66 Do not resuscitate: Secondary | ICD-10-CM | POA: Diagnosis not present

## 2014-12-02 DIAGNOSIS — I501 Left ventricular failure: Secondary | ICD-10-CM | POA: Diagnosis present

## 2014-12-02 DIAGNOSIS — N2581 Secondary hyperparathyroidism of renal origin: Secondary | ICD-10-CM | POA: Diagnosis not present

## 2014-12-02 DIAGNOSIS — M4856XA Collapsed vertebra, not elsewhere classified, lumbar region, initial encounter for fracture: Secondary | ICD-10-CM | POA: Diagnosis present

## 2014-12-02 DIAGNOSIS — M5489 Other dorsalgia: Secondary | ICD-10-CM | POA: Diagnosis not present

## 2014-12-02 DIAGNOSIS — N184 Chronic kidney disease, stage 4 (severe): Secondary | ICD-10-CM | POA: Diagnosis not present

## 2014-12-02 DIAGNOSIS — E785 Hyperlipidemia, unspecified: Secondary | ICD-10-CM | POA: Diagnosis present

## 2014-12-02 DIAGNOSIS — R778 Other specified abnormalities of plasma proteins: Secondary | ICD-10-CM | POA: Diagnosis present

## 2014-12-02 DIAGNOSIS — I421 Obstructive hypertrophic cardiomyopathy: Secondary | ICD-10-CM | POA: Diagnosis not present

## 2014-12-02 DIAGNOSIS — E1121 Type 2 diabetes mellitus with diabetic nephropathy: Secondary | ICD-10-CM | POA: Diagnosis present

## 2014-12-02 DIAGNOSIS — R001 Bradycardia, unspecified: Secondary | ICD-10-CM | POA: Diagnosis not present

## 2014-12-02 DIAGNOSIS — IMO0002 Reserved for concepts with insufficient information to code with codable children: Secondary | ICD-10-CM | POA: Diagnosis present

## 2014-12-02 DIAGNOSIS — M549 Dorsalgia, unspecified: Secondary | ICD-10-CM | POA: Insufficient documentation

## 2014-12-02 DIAGNOSIS — E1165 Type 2 diabetes mellitus with hyperglycemia: Secondary | ICD-10-CM

## 2014-12-02 DIAGNOSIS — R0902 Hypoxemia: Secondary | ICD-10-CM | POA: Diagnosis not present

## 2014-12-02 DIAGNOSIS — S32009A Unspecified fracture of unspecified lumbar vertebra, initial encounter for closed fracture: Secondary | ICD-10-CM

## 2014-12-02 DIAGNOSIS — R7989 Other specified abnormal findings of blood chemistry: Secondary | ICD-10-CM | POA: Diagnosis present

## 2014-12-02 HISTORY — DX: Obstructive hypertrophic cardiomyopathy: I42.1

## 2014-12-02 HISTORY — DX: Anemia in other chronic diseases classified elsewhere: D63.8

## 2014-12-02 HISTORY — DX: Type 2 diabetes mellitus without complications: E11.9

## 2014-12-02 HISTORY — DX: Essential (primary) hypertension: I10

## 2014-12-02 HISTORY — DX: End stage renal disease: N18.6

## 2014-12-02 LAB — BASIC METABOLIC PANEL
Anion gap: 11 (ref 5–15)
BUN: 31 mg/dL — ABNORMAL HIGH (ref 6–23)
CALCIUM: 9.3 mg/dL (ref 8.4–10.5)
CO2: 26 mmol/L (ref 19–32)
Chloride: 101 mmol/L (ref 96–112)
Creatinine, Ser: 4.48 mg/dL — ABNORMAL HIGH (ref 0.50–1.10)
GFR calc Af Amer: 9 mL/min — ABNORMAL LOW (ref >90)
GFR calc non Af Amer: 8 mL/min — ABNORMAL LOW (ref >90)
Glucose, Bld: 102 mg/dL — ABNORMAL HIGH (ref 70–99)
Potassium: 4.3 mmol/L (ref 3.5–5.1)
SODIUM: 138 mmol/L (ref 135–145)

## 2014-12-02 LAB — CBC WITH DIFFERENTIAL/PLATELET
Basophils Absolute: 0 10*3/uL (ref 0.0–0.1)
Basophils Relative: 0 % (ref 0–1)
EOS ABS: 0.1 10*3/uL (ref 0.0–0.7)
Eosinophils Relative: 1 % (ref 0–5)
HCT: 34.4 % — ABNORMAL LOW (ref 36.0–46.0)
HEMOGLOBIN: 11.3 g/dL — AB (ref 12.0–15.0)
LYMPHS PCT: 10 % — AB (ref 12–46)
Lymphs Abs: 1.2 10*3/uL (ref 0.7–4.0)
MCH: 30.8 pg (ref 26.0–34.0)
MCHC: 32.8 g/dL (ref 30.0–36.0)
MCV: 93.7 fL (ref 78.0–100.0)
Monocytes Absolute: 0.8 10*3/uL (ref 0.1–1.0)
Monocytes Relative: 7 % (ref 3–12)
NEUTROS ABS: 9.9 10*3/uL — AB (ref 1.7–7.7)
Neutrophils Relative %: 82 % — ABNORMAL HIGH (ref 43–77)
PLATELETS: 248 10*3/uL (ref 150–400)
RBC: 3.67 MIL/uL — AB (ref 3.87–5.11)
RDW: 14.4 % (ref 11.5–15.5)
WBC: 11.9 10*3/uL — AB (ref 4.0–10.5)

## 2014-12-02 LAB — URINE MICROSCOPIC-ADD ON

## 2014-12-02 LAB — URINALYSIS, ROUTINE W REFLEX MICROSCOPIC
Bilirubin Urine: NEGATIVE
GLUCOSE, UA: 100 mg/dL — AB
Ketones, ur: NEGATIVE mg/dL
NITRITE: NEGATIVE
Protein, ur: 100 mg/dL — AB
Specific Gravity, Urine: 1.015 (ref 1.005–1.030)
Urobilinogen, UA: 0.2 mg/dL (ref 0.0–1.0)
pH: 7.5 (ref 5.0–8.0)

## 2014-12-02 LAB — TROPONIN I
TROPONIN I: 0.56 ng/mL — AB (ref <0.031)
Troponin I: 0.69 ng/mL (ref <0.031)

## 2014-12-02 LAB — MRSA PCR SCREENING: MRSA BY PCR: NEGATIVE

## 2014-12-02 LAB — GLUCOSE, CAPILLARY: GLUCOSE-CAPILLARY: 96 mg/dL (ref 70–99)

## 2014-12-02 MED ORDER — FENTANYL CITRATE 0.05 MG/ML IJ SOLN
100.0000 ug | Freq: Once | INTRAMUSCULAR | Status: AC
  Administered 2014-12-02: 100 ug via NASAL
  Filled 2014-12-02: qty 2

## 2014-12-02 MED ORDER — HEPARIN SODIUM (PORCINE) 5000 UNIT/ML IJ SOLN
5000.0000 [IU] | Freq: Three times a day (TID) | INTRAMUSCULAR | Status: DC
  Administered 2014-12-02 – 2014-12-05 (×8): 5000 [IU] via SUBCUTANEOUS
  Filled 2014-12-02 (×12): qty 1

## 2014-12-02 MED ORDER — MORPHINE SULFATE 4 MG/ML IJ SOLN
4.0000 mg | Freq: Once | INTRAMUSCULAR | Status: AC
  Administered 2014-12-02: 4 mg via INTRAVENOUS
  Filled 2014-12-02: qty 1

## 2014-12-02 MED ORDER — GLUCOSAMINE-CHONDROITIN 250-200 MG PO CAPS: 1.0000 | ORAL_CAPSULE | Freq: Every day | ORAL | Status: DC

## 2014-12-02 MED ORDER — DIAZEPAM 5 MG PO TABS
5.0000 mg | ORAL_TABLET | Freq: Two times a day (BID) | ORAL | Status: DC | PRN
  Administered 2014-12-02 – 2014-12-11 (×7): 5 mg via ORAL
  Filled 2014-12-02 (×7): qty 1

## 2014-12-02 MED ORDER — FUROSEMIDE 10 MG/ML IJ SOLN
40.0000 mg | Freq: Once | INTRAMUSCULAR | Status: AC
  Administered 2014-12-02: 40 mg via INTRAVENOUS
  Filled 2014-12-02: qty 4

## 2014-12-02 MED ORDER — METOPROLOL SUCCINATE 12.5 MG HALF TABLET
12.5000 mg | ORAL_TABLET | Freq: Every day | ORAL | Status: DC
  Filled 2014-12-02: qty 1

## 2014-12-02 MED ORDER — AMITRIPTYLINE HCL 10 MG PO TABS
10.0000 mg | ORAL_TABLET | Freq: Every day | ORAL | Status: DC
  Administered 2014-12-02 – 2014-12-14 (×13): 10 mg via ORAL
  Filled 2014-12-02 (×14): qty 1

## 2014-12-02 MED ORDER — ACETAMINOPHEN 500 MG PO TABS
1000.0000 mg | ORAL_TABLET | Freq: Four times a day (QID) | ORAL | Status: DC | PRN
  Administered 2014-12-08 – 2014-12-12 (×2): 1000 mg via ORAL
  Filled 2014-12-02 (×3): qty 2

## 2014-12-02 MED ORDER — SODIUM CHLORIDE 0.9 % IJ SOLN
3.0000 mL | Freq: Two times a day (BID) | INTRAMUSCULAR | Status: DC
  Administered 2014-12-02 – 2014-12-15 (×26): 3 mL via INTRAVENOUS

## 2014-12-02 MED ORDER — ASPIRIN EC 81 MG PO TBEC
81.0000 mg | DELAYED_RELEASE_TABLET | Freq: Every day | ORAL | Status: DC
  Filled 2014-12-02: qty 1

## 2014-12-02 MED ORDER — POLYETHYLENE GLYCOL 3350 17 G PO PACK
17.0000 g | PACK | Freq: Every day | ORAL | Status: DC
  Administered 2014-12-02: 17 g via ORAL
  Filled 2014-12-02 (×2): qty 1

## 2014-12-02 MED ORDER — PANTOPRAZOLE SODIUM 40 MG PO TBEC
40.0000 mg | DELAYED_RELEASE_TABLET | Freq: Every day | ORAL | Status: DC
  Administered 2014-12-02: 40 mg via ORAL
  Filled 2014-12-02 (×2): qty 1

## 2014-12-02 MED ORDER — FENTANYL CITRATE 0.05 MG/ML IJ SOLN
25.0000 ug | INTRAMUSCULAR | Status: DC | PRN
Start: 2014-12-02 — End: 2014-12-03
  Administered 2014-12-03 (×4): 25 ug via INTRAVENOUS
  Filled 2014-12-02 (×3): qty 2

## 2014-12-02 MED ORDER — POTASSIUM CHLORIDE CRYS ER 20 MEQ PO TBCR
20.0000 meq | EXTENDED_RELEASE_TABLET | Freq: Every day | ORAL | Status: DC
  Administered 2014-12-03 – 2014-12-04 (×2): 20 meq via ORAL
  Filled 2014-12-02 (×3): qty 1

## 2014-12-02 MED ORDER — INSULIN GLARGINE 100 UNIT/ML ~~LOC~~ SOLN
5.0000 [IU] | Freq: Every day | SUBCUTANEOUS | Status: DC
  Administered 2014-12-02 – 2014-12-07 (×5): 5 [IU] via SUBCUTANEOUS
  Filled 2014-12-02 (×6): qty 0.05

## 2014-12-02 MED ORDER — SODIUM CHLORIDE 0.9 % IJ SOLN: 3.0000 mL | INTRAMUSCULAR | Status: DC | PRN

## 2014-12-02 MED ORDER — FEBUXOSTAT 40 MG PO TABS
40.0000 mg | ORAL_TABLET | Freq: Every day | ORAL | Status: DC
  Administered 2014-12-02 – 2014-12-14 (×13): 40 mg via ORAL
  Filled 2014-12-02 (×14): qty 1

## 2014-12-02 MED ORDER — INSULIN ASPART 100 UNIT/ML ~~LOC~~ SOLN
0.0000 [IU] | Freq: Three times a day (TID) | SUBCUTANEOUS | Status: DC
  Administered 2014-12-03 (×2): 2 [IU] via SUBCUTANEOUS
  Administered 2014-12-03: 3 [IU] via SUBCUTANEOUS
  Administered 2014-12-04 (×2): 5 [IU] via SUBCUTANEOUS
  Administered 2014-12-05: 3 [IU] via SUBCUTANEOUS
  Administered 2014-12-05: 2 [IU] via SUBCUTANEOUS
  Administered 2014-12-05: 9 [IU] via SUBCUTANEOUS
  Administered 2014-12-06: 2 [IU] via SUBCUTANEOUS

## 2014-12-02 MED ORDER — SODIUM CHLORIDE 0.9 % IV SOLN: 250.0000 mL | INTRAVENOUS | Status: DC | PRN

## 2014-12-02 MED ORDER — LORATADINE 10 MG PO TABS
10.0000 mg | ORAL_TABLET | Freq: Every day | ORAL | Status: DC
  Administered 2014-12-04 – 2014-12-15 (×12): 10 mg via ORAL
  Filled 2014-12-02 (×14): qty 1

## 2014-12-02 MED ORDER — ATORVASTATIN CALCIUM 20 MG PO TABS
20.0000 mg | ORAL_TABLET | Freq: Every day | ORAL | Status: DC
  Administered 2014-12-02 – 2014-12-14 (×13): 20 mg via ORAL
  Filled 2014-12-02 (×14): qty 1

## 2014-12-02 MED ORDER — TRAMADOL HCL 50 MG PO TABS
50.0000 mg | ORAL_TABLET | Freq: Four times a day (QID) | ORAL | Status: DC | PRN
  Administered 2014-12-02 – 2014-12-15 (×24): 50 mg via ORAL
  Filled 2014-12-02 (×23): qty 1

## 2014-12-02 MED ORDER — LANTHANUM CARBONATE 500 MG PO CHEW
1000.0000 mg | CHEWABLE_TABLET | Freq: Three times a day (TID) | ORAL | Status: DC
  Administered 2014-12-03 – 2014-12-15 (×26): 1000 mg via ORAL
  Filled 2014-12-02 (×40): qty 2

## 2014-12-02 NOTE — H&P (Signed)
[facility] Admission History and Physical Service Pager: [phone]  Patient name: [name] [name] Medical record number: [id number] Date of birth: 1926-06-10 Age: 81 y.o. Gender: female  Primary Care Provider: Ann Held, MD Consultants: Manson Passey, Renal  Code Status: DNR  Chief Complaint: Fall   Assessment and Plan: [name] [name] is a 81 y.o. female presenting with fall. PMH is significant for Hx L2 fracture w/ previous kypophlasty, ESRD on HD since 2015, mild non-obstructive CAD on cath in 2008 with evidence of HOCM, DM2, HTN, osteoporosis, GERD and dCHF.    #Fall/L5 compression fx: Hx of compression fx of L2 and kyphoplasty in Jan 2015. No report of Syncope or LOC. She was walking and tripped on her way to the bathroom. Landed on her buttocks. No sign of bruising or trauma. No weakness on exam with sensation, strength and ankle reflexes intact.  Is followed by [facility] orthopedics.   - admit to telemetry, Dr. Mingo Amber attending  - fentanyl 25 mcg PO PRN for severe pain - tramadol for moderate pain  - tylenol for mild pain  - Ortho consulted   - recommend CT or MRI but radiodense material in the colon   #Elevated Troponin: hx of having troponins elevated. Cardiology has chalked it up to demand ischemia previously. No reports of SOB or chest pain.  - troponins x 3  - EKG Am  #Anemia of Chronic disease/MGUS: Has been seen by Dr. Marin Olp. Baseline hemoglobin 11.8 hematocrit 36.6. Blood studies showed a monoclonal spike of 0.18 g/dL. On a serum immunofixation, a IgG Kappa protein was noted. However, she had normal values of her immunoglobulins. Unsure of how aggressive treatment is.   #dCHF: appears to be fluid overloaded on CXR but breathing fine with minimal basilar crackles. No edema on exam and on room air. No extra effort of breathing.  - lasix x 1 given  - ECHO   #HOCM,HTN, HLD, CAD: minimal by Cardiac cath 2008  - metoprolol 12.5 mg daily   -  continue lipitor   #DM2: controlled. Hgb A1c 6.4 (11/08/13)  -  Lantus 5 U  - Sen SSi  - CBG's   #ESRD on HD: secondary to DM2.  Started HD in 2015.  Missed HD appt yesterday. Imaging suggestive of fluid on. Lasix given in the ED. Dr. Posey Pronto is her nephrologist  - renal called   #Chronic dysphagia: patient with history of and a barium swallow study performed last year which showed esophageal dysmotility. EGD unremarkable  - protonix   FEN/GI: regular diet, saline lock  Prophylaxis: subQ hep   Disposition: admitted to family medicine for fall   History of Present Illness: [name] [name] is a 81 y.o. female presenting with fall. Patient lives at assisted living. She got up early this morning to go to the bathroom. She fell on her buttock while in the bathroom. She started having pain in her lumbar back after the fall. The pain was rated as 9/10 and sharp and no radiation. The pain was worse with moving.  She denies any weakness or loss of sensation. She was not feeling well yesterday and didn't go to her HD appt. She was having elevated temps in the 99 range with nausea and dry heaves. She denied any syncope or LOC. No cough, congestion, ab pain, SOB or chest pain.   Review Of Systems: Per HPI with the following additions: See HPI  Otherwise 12 point review of systems was performed and was unremarkable.  Patient  Active Problem List   Diagnosis Date Noted  . Compression fracture of L5 lumbar vertebra 12/02/2014  . Acute on chronic renal failure 11/07/2013  . Acute renal failure 11/07/2013  . Aspiration pneumonia 10/14/2013  . Compression fracture of L2 lumbar vertebra 10/14/2013  . Acute diastolic CHF (congestive heart failure) 10/13/2013  . Elevated troponin 10/13/2013  . Other dysphagia 10/11/2013  . Dysphagia, unspecified(787.20) 10/10/2013  . Malnutrition of moderate degree 10/10/2013  . Acute respiratory failure 10/08/2013  . Fluid overload 10/08/2013  . Hypertrophic  obstructive cardiomyopathy(425.11) 10/05/2013  . UTI (urinary tract infection) 10/04/2013  . Hyperkalemia 10/04/2013  . Hypoglycemia 10/02/2013  . CKD (chronic kidney disease) stage 4, GFR 15-29 ml/min 10/01/2013  . Rhabdomyolysis 10/01/2013  . End stage renal disease 07/06/2012  . Murmur 05/27/2011  . OBESITY, UNSPECIFIED 03/20/2009  . ABNORMAL ELECTROCARDIOGRAM 03/20/2009  . DM 03/19/2009  . HYPERCHOLESTEROLEMIA 03/19/2009  . ANEMIA, CHRONIC 03/19/2009  . HYPERTENSION 03/19/2009  . RENAL INSUFFICIENCY 03/19/2009   Past Medical History: Past Medical History  Diagnosis Date  . Diabetes mellitus     mellitus? x25 years  . HTN (hypertension)     x2 years  . Hypercholesterolemia     x 2 years  . Renal insufficiency     mild to moderate  . Anemia     chronic anemia due to chronic disease  . CAD (coronary artery disease)     nonobstructive   . GERD (gastroesophageal reflux disease)    Past Surgical History: Past Surgical History  Procedure Laterality Date  . Total knee arthroplasty      L-2006; R-2008   . Appendectomy    . Back surgery      lower back  . Transthoracic echocardiogram  05/2011  . Av fistula placement  07/07/2012    Procedure: ARTERIOVENOUS (AV) FISTULA CREATION;  Surgeon: Mal Misty, MD;  Location: [facility] OR;  Service: Vascular;  Laterality: Right;  Brachial - cephalic arterivenous fistula  . Esophagogastroduodenoscopy (egd) with esophageal dilation N/A 10/12/2013    Procedure: ESOPHAGOGASTRODUODENOSCOPY (EGD) WITH ESOPHAGEAL DILATION;  Surgeon: Lafayette Dragon, MD;  Location: [facility] ENDOSCOPY;  Service: Endoscopy;  Laterality: N/A;  . Savory dilation N/A 10/12/2013    Procedure: SAVORY DILATION;  Surgeon: Lafayette Dragon, MD;  Location: [facility] ENDOSCOPY;  Service: Endoscopy;  Laterality: N/A;   Social History: History  Substance Use Topics  . Smoking status: Never Smoker   . Smokeless tobacco: Never Used     Comment: no smoking   . Alcohol Use: No   Additional  social history: no tobacco, EtOH or illicit drug use   Please also refer to relevant sections of EMR.  Family History: Family History  Problem Relation Age of Onset  . Heart attack Father    Allergies and Medications: No Known Allergies No current facility-administered medications on file prior to encounter.   Current Outpatient Prescriptions on File Prior to Encounter  Medication Sig Dispense Refill  . amitriptyline (ELAVIL) 10 MG tablet Take 10 mg by mouth at bedtime.     Marland Kitchen aspirin EC 81 MG tablet Take 81 mg by mouth daily.    Marland Kitchen atorvastatin (LIPITOR) 20 MG tablet Take 1 tablet (20 mg total) by mouth daily. 30 tablet 0  . b complex-vitamin c-folic acid (NEPHRO-VITE) 0.8 MG TABS tablet Take 1 tablet by mouth at bedtime.    . cyanocobalamin (,VITAMIN B-12,) 1000 MCG/ML injection Inject 1,000 mcg into the muscle every 30 (thirty) days.    . febuxostat (  ULORIC) 40 MG tablet Take 40 mg by mouth at bedtime.    . fish oil-omega-3 fatty acids 1000 MG capsule Take 1 g by mouth daily.     . Glucosamine-Chondroitin 250-200 MG CAPS Take 1 capsule by mouth daily.      . insulin detemir (LEVEMIR) 100 UNIT/ML injection Inject 10 Units into the skin daily.    . insulin regular (NOVOLIN R,HUMULIN R) 100 units/mL injection Inject 0-12 Units into the skin 3 (three) times daily before meals. Per sliding scale: 0-150=0 units, 151-200 = 2 units, 201-250 = 4 units, 251-300 = 6 units, 301-350 = 8 units, 351-400 = 10 units, 401-450 = 12 units, > 450 CALL MD.    . lansoprazole (PREVACID) 30 MG capsule Take 30 mg by mouth daily at 12 noon.    Marland Kitchen lanthanum (FOSRENOL) 500 MG chewable tablet Chew 1,000 mg by mouth 3 (three) times daily with meals.     . metoprolol succinate (TOPROL XL) 25 MG 24 hr tablet Take 0.5 tablets (12.5 mg total) by mouth daily. 15 tablet 11  . OxyCODONE HCl, Abuse Deter, 5 MG TABA Take 5 mg by mouth every 4 (four) hours as needed (pain).     . potassium chloride SA (K-DUR,KLOR-CON) 20 MEQ  tablet Take 20 mEq by mouth daily.    . calcitRIOL (ROCALTROL) 0.25 MCG capsule Take 1 capsule (0.25 mcg total) by mouth daily. (Patient not taking: Reported on 12/02/2014) 30 capsule 0  . diazepam (VALIUM) 5 MG tablet Take 1 tablet (5 mg total) by mouth every 12 (twelve) hours as needed for anxiety. 10 tablet 0  . feeding supplement, GLUCERNA SHAKE, (GLUCERNA SHAKE) LIQD Take 237 mLs by mouth 2 (two) times daily between meals. (Patient not taking: Reported on 12/02/2014) 30 Can 0  . phenol (CHLORASEPTIC) 1.4 % LIQD Use as directed 1 spray in the mouth or throat 3 (three) times daily as needed for throat irritation / pain.    . polyethylene glycol (MIRALAX / GLYCOLAX) packet Take 17 g by mouth at bedtime.    . sodium bicarbonate 650 MG tablet Take 1 tablet (650 mg total) by mouth daily. (Patient not taking: Reported on 12/02/2014) 30 tablet 0  . traMADol (ULTRAM) 50 MG tablet Take 1 tablet (50 mg total) by mouth every 6 (six) hours as needed (pain). 30 tablet 0    Objective: BP 166/51 mmHg  Pulse 79  Temp(Src) 99.2 F (37.3 C) (Oral)  Resp 24  SpO2 95% Exam: General: Elderly female, alert, NAD  HEENT: EOMI, PERRL, MMM,  Cardiovascular: S1S2, RRR, murmur heard  Respiratory: bibasilar crackles, no extra effort,  Abdomen: soft, NTND, +BS  Extremities: moves all freely, distal pulses intact, 5/5/ strength in LE b/l, sensation in b/l LE b/l  Skin: no ecchymosis  Neuro: no gross deficits  MSK: movement of her LE induces pain. Painful to palpation in lumbar right side.   Labs and Imaging: CBC BMET   Recent Labs Lab 12/02/14 1159  WBC 11.9*  HGB 11.3*  HCT 34.4*  PLT 248    Recent Labs Lab 12/02/14 1159  NA 138  K 4.3  CL 101  CO2 26  BUN 31*  CREATININE 4.48*  GLUCOSE 102*  CALCIUM 9.3       Rosemarie Ax, MD 12/02/2014, 4:22 PM PGY-2, [place] [facility] pager: [phone], text pages welcome

## 2014-12-02 NOTE — ED Provider Notes (Signed)
CSN: [id number]     Arrival date & time 12/02/14  1123 History   First MD Initiated Contact with Patient 12/02/14 1126     Chief Complaint  Patient presents with  . Fall     (Consider location/radiation/quality/duration/timing/severity/associated sxs/prior Treatment) HPI Comments: Patient is an 81 yo F PMHx significant for DM, HTN, HLD, CAD, anemia, renal insufficiency on dialysis MWF presenting to the ED for evaluation of buttock pain after a fall earlier this morning. Patient states she was going to the bathroom went to reach for something to hold onto she transferred her walker to the toilet when she slipped and fell landing on her bottom. She denies hitting her head or loss of consciousness. She is complaining of buttock pain without radiation or numbness or weakness in his lower extremities. She denies any precipitating chest pain, shortness of breath, headache, lightheadedness or dizziness. She states yesterday she was nauseous with a low-grade fever (99.42F) and myalgias no symptoms right now. Patient did not attend dialysis yesterday. Denies missing any medications.   Patient is a 81 y.o. female presenting with fall.  Fall    Past Medical History  Diagnosis Date  . Diabetes mellitus     mellitus? x25 years  . HTN (hypertension)     x2 years  . Hypercholesterolemia     x 2 years  . Renal insufficiency     mild to moderate  . Anemia     chronic anemia due to chronic disease  . CAD (coronary artery disease)     nonobstructive   . GERD (gastroesophageal reflux disease)    Past Surgical History  Procedure Laterality Date  . Total knee arthroplasty      L-2006; R-2008   . Appendectomy    . Back surgery      lower back  . Transthoracic echocardiogram  05/2011  . Av fistula placement  07/07/2012    Procedure: ARTERIOVENOUS (AV) FISTULA CREATION;  Surgeon: Mal Misty, MD;  Location: [facility] OR;  Service: Vascular;  Laterality: Right;  Brachial - cephalic arterivenous fistula  .  Esophagogastroduodenoscopy (egd) with esophageal dilation N/A 10/12/2013    Procedure: ESOPHAGOGASTRODUODENOSCOPY (EGD) WITH ESOPHAGEAL DILATION;  Surgeon: Lafayette Dragon, MD;  Location: [facility] ENDOSCOPY;  Service: Endoscopy;  Laterality: N/A;  . Savory dilation N/A 10/12/2013    Procedure: SAVORY DILATION;  Surgeon: Lafayette Dragon, MD;  Location: [facility] ENDOSCOPY;  Service: Endoscopy;  Laterality: N/A;   Family History  Problem Relation Age of Onset  . Heart attack Father    History  Substance Use Topics  . Smoking status: Never Smoker   . Smokeless tobacco: Never Used     Comment: no smoking   . Alcohol Use: No   OB History    No data available     Review of Systems  Musculoskeletal: Positive for back pain.  All other systems reviewed and are negative.     Allergies  Review of patient's allergies indicates no known allergies.  Home Medications   Prior to Admission medications   Medication Sig Start Date End Date Taking? Authorizing Provider  amitriptyline (ELAVIL) 10 MG tablet Take 10 mg by mouth at bedtime.     Historical Provider, MD  aspirin EC 81 MG tablet Take 81 mg by mouth daily.    Historical Provider, MD  atorvastatin (LIPITOR) 20 MG tablet Take 1 tablet (20 mg total) by mouth daily. 11/11/13   Velvet Bathe, MD  b complex-vitamin c-folic acid (NEPHRO-VITE) 0.8 MG TABS  tablet Take 1 tablet by mouth at bedtime.    Historical Provider, MD  calcitRIOL (ROCALTROL) 0.25 MCG capsule Take 1 capsule (0.25 mcg total) by mouth daily. 10/14/13   Nishant Dhungel, MD  cholecalciferol (VITAMIN D) 1000 UNITS tablet Take 1,000 Units by mouth daily.    Historical Provider, MD  cyanocobalamin (,VITAMIN B-12,) 1000 MCG/ML injection Inject 1,000 mcg into the muscle every 30 (thirty) days.    Historical Provider, MD  diazepam (VALIUM) 5 MG tablet Take 1 tablet (5 mg total) by mouth every 12 (twelve) hours as needed for anxiety. 11/11/13   Velvet Bathe, MD  febuxostat (ULORIC) 40 MG tablet Take 40 mg  by mouth at bedtime.    Historical Provider, MD  feeding supplement, GLUCERNA SHAKE, (GLUCERNA SHAKE) LIQD Take 237 mLs by mouth 2 (two) times daily between meals. 11/11/13   Velvet Bathe, MD  fish oil-omega-3 fatty acids 1000 MG capsule Take 1 g by mouth daily.     Historical Provider, MD  Glucosamine-Chondroitin 250-200 MG CAPS Take 1 capsule by mouth daily.      Historical Provider, MD  insulin detemir (LEVEMIR) 100 UNIT/ML injection Inject 10 Units into the skin daily.    Historical Provider, MD  insulin regular (NOVOLIN R,HUMULIN R) 100 units/mL injection Inject 0-12 Units into the skin 3 (three) times daily before meals. Per sliding scale: 0-150=0 units, 151-200 = 2 units, 201-250 = 4 units, 251-300 = 6 units, 301-350 = 8 units, 351-400 = 10 units, 401-450 = 12 units, > 450 CALL MD.    Historical Provider, MD  lansoprazole (PREVACID) 30 MG capsule Take 30 mg by mouth daily at 12 noon.    Historical Provider, MD  lanthanum (FOSRENOL) 500 MG chewable tablet Chew 500 mg by mouth 3 (three) times daily with meals.    Historical Provider, MD  metoprolol succinate (TOPROL XL) 25 MG 24 hr tablet Take 0.5 tablets (12.5 mg total) by mouth daily. 11/29/13   Liliane Shi, PA-C  Misc Natural Products (OSTEO BI-FLEX TRIPLE STRENGTH PO) Take 1 tablet by mouth daily.    Historical Provider, MD  OxyCODONE HCl, Abuse Deter, 5 MG TABA Take 5 mg by mouth every 4 (four) hours.    Historical Provider, MD  phenol (CHLORASEPTIC) 1.4 % LIQD Use as directed 1 spray in the mouth or throat 3 (three) times daily as needed for throat irritation / pain.    Historical Provider, MD  polyethylene glycol (MIRALAX / GLYCOLAX) packet Take 17 g by mouth at bedtime.    Historical Provider, MD  potassium chloride SA (K-DUR,KLOR-CON) 20 MEQ tablet Take 20 mEq by mouth daily.    Historical Provider, MD  sodium bicarbonate 650 MG tablet Take 1 tablet (650 mg total) by mouth daily. 10/14/13   Nishant Dhungel, MD  traMADol (ULTRAM) 50 MG  tablet Take 1 tablet (50 mg total) by mouth every 6 (six) hours as needed (pain). 11/11/13   Velvet Bathe, MD   BP 181/61 mmHg  Pulse 79  Temp(Src) 99.2 F (37.3 C) (Oral)  Resp 20  SpO2 95% Physical Exam  Constitutional: She is oriented to person, place, and time. She appears well-developed and well-nourished. No distress.  HENT:  Head: Normocephalic and atraumatic.  Right Ear: External ear normal.  Left Ear: External ear normal.  Nose: Nose normal.  Mouth/Throat: Oropharynx is clear and moist. No oropharyngeal exudate.  Eyes: Conjunctivae are normal.  Neck: Normal range of motion. Neck supple.  Cardiovascular: Normal rate, regular rhythm and  intact distal pulses.   Murmur heard.  Systolic murmur is present  Pulmonary/Chest: Effort normal. No accessory muscle usage. No respiratory distress. She has rhonchi.  Abdominal: Soft. There is no tenderness.  Musculoskeletal: Normal range of motion.       Cervical back: Normal.       Thoracic back: Normal.       Lumbar back: She exhibits normal range of motion, no tenderness and no bony tenderness.       Back:  No bruising noted.   Neurological: She is alert and oriented to person, place, and time. She has normal strength. No cranial nerve deficit or sensory deficit. GCS eye subscore is 4. GCS verbal subscore is 5. GCS motor subscore is 6.  Moves all extremities w/o ataxia.   Skin: Skin is warm and dry. She is not diaphoretic.  Psychiatric: She has a normal mood and affect.  Nursing note and vitals reviewed.   ED Course  Procedures (including critical care time) Medications  fentaNYL (SUBLIMAZE) injection 100 mcg (100 mcg Nasal Given 12/02/14 1308)    Labs Review Labs Reviewed  CBC WITH DIFFERENTIAL/PLATELET - Abnormal; Notable for the following:    WBC 11.9 (*)    RBC 3.67 (*)    Hemoglobin 11.3 (*)    HCT 34.4 (*)    Neutrophils Relative % 82 (*)    Neutro Abs 9.9 (*)    Lymphocytes Relative 10 (*)    All other components  within normal limits  URINE CULTURE  BASIC METABOLIC PANEL  TROPONIN I  URINALYSIS, ROUTINE W REFLEX MICROSCOPIC    Imaging Review Dg Chest 1 View  12/02/2014   CLINICAL DATA:  Fall. Patient on dialysis and did not receive regular dialysis is yesterday due to fever.  EXAM: CHEST  1 VIEW  COMPARISON:  11/14/2013  FINDINGS: Marked cardiac enlargement noted. There is a left plural effusion present. Mild to moderate interstitial edema noted. There is multifocal airspace opacities within the left midlung and left base.  IMPRESSION: 1. Moderate CHF. 2. Multifocal airspace opacities in left lung may represent edema or infection.   Electronically Signed   By: Kerby Moors M.D.   On: 12/02/2014 13:10   Dg Sacrum/coccyx  12/02/2014   CLINICAL DATA:  fall at her nursing facility ([facility]), pt reports pain in her sacrum. Pt is a dialysis pt and did not receive her regular dialysis yesterday due to fever. AP view is obscured. Pt denies any recent contrast studies. Pt unwilling to lay flat due to pain level. Best obtainable images  EXAM: SACRUM AND COCCYX - 2+ VIEW  COMPARISON:  01/01/2014  FINDINGS: New compression fracture deformity of L5 with approximately 50% loss of height. Previous kyphoplasty/ vertebroplasty of L2 stable. Diffuse osteopenia. Old fracture deformities of the right superior and inferior pubic rami. Bilateral pelvic phleboliths. Extensive radiodense material in the colon as if before.  IMPRESSION: 1. L5 compression fracture deformity, new since prior study. If patient is under consideration for kyphoplasty/vertebroplasty, recommend CT or MR lumbar spine to exclude burst fracture or posterior element involvement.   Electronically Signed   By: Lucrezia Europe M.D.   On: 12/02/2014 13:13     EKG Interpretation   Date/Time:  Saturday December 02 2014 14:43:26 EST Ventricular Rate:  80 PR Interval:  215 QRS Duration: 107 QT Interval:  369 QTC Calculation: 426 R Axis:   -48 Text  Interpretation:  Sinus rhythm Borderline prolonged PR interval LAD,  consider left anterior fascicular block  Anterior infarct, old Borderline  repolarization abnormality Confirmed by Alvino Chapel  MD, Ovid Curd [phone]) on  12/02/2014 2:51:47 PM      3:39 PM Discussed patient with Dr. Gladstone Lighter of [place], [place] will round on in the morning.   4:17 PM Cardiology will see the patient in consultation.   MDM   Final diagnoses:  Fall  Pulmonary edema cardiac cause  Compression fracture of L5 lumbar vertebra, closed, initial encounter  Chronic kidney disease, stage 4 (severe)    Filed Vitals:   12/02/14 1315  BP: 181/61  Pulse: 79  Temp:   Resp: 20     Afebrile, NAD, non-toxic appearing, AAOx4.  I have reviewed nursing notes, vital signs, and all appropriate lab and imaging results for this patient. No neurofocal deficits on examination. Rhonchi hear throughout lung fields. No obvious deformity or injury noted on physical examination. No lower extremity edema. EKG unchanged from previous. CXR with interstitial edema and left pleural effusion. IV Lasix ordered until patient can go to dialysis. Troponin elevated, previously elevated attributed to demand ischemia from CAD with HOCM as noted by Dr. Percival Spanish at last admission. Will admit patient to [facility] for further evaluation and management.   Patient d/w with Dr. Alvino Chapel, agrees with plan.    Baron Sane, PA-C 12/02/14 [place], MD 12/02/14 [phone]

## 2014-12-02 NOTE — Consult Note (Signed)
Primary Cardiologist: Dr. Minus Breeding  Reason for Consult:   Elevated Troponin  Requesting Physician: FPTS  HPI: This is a 81 y.o. female with a past medical history significant for a history of an abnormal EKG and mild non-obstructive CAD on cath in 2008 with evidence of HOCM. She was in the hospital in January 2015 with acute on chronic renal failure in the setting of rhabdomyolysis and severe hypoglycemia after sustaining a fall at home. She was seen by [place]. Echocardiogram then demonstrated normal LV function and dynamic outflow tract obstruction consistent with HOCM. Peak gradient 51 mmHg. There was also mild mitral stenosis. She had elevated troponins consistent with demand ischemia. She was diuresed. She was readmitted 2/16-2/20 with pulmonary edema in the setting of acute on chronic renal failure. Creatinine was 12. She was started on hemodialysis. She goes MWF- [place]. Dr Posey Pronto is her nephrologist. She says she didn't go Friday "didn't feel well". She was supposed to go today but did not.           The pt is in the ED now after falling at home. She has a new compression fracture at L5. A Troponin was drawn and is mildly elevated 0.58. She has CHF on CXR. She denies chest pain. Reports compliance with her medications. No palpitations or syncope. Complains of back pain.   PMHx:  Past Medical History  Diagnosis Date  . Type 2 diabetes mellitus     mellitus? x25 years  . Essential hypertension   . Hypercholesterolemia   . ESRD (end stage renal disease)   . Anemia of chronic disease   . CAD (coronary artery disease)     Nonobstructive   . GERD (gastroesophageal reflux disease)   . HOCM (hypertrophic obstructive cardiomyopathy)     Past Surgical History  Procedure Laterality Date  . Total knee arthroplasty      L-2006; R-2008   . Appendectomy    . Back surgery    . Transthoracic echocardiogram  05/2011  . Av fistula placement  07/07/2012    Procedure: ARTERIOVENOUS (AV)  FISTULA CREATION;  Surgeon: Mal Misty, MD;  Location: [facility] OR;  Service: Vascular;  Laterality: Right;  Brachial - cephalic arterivenous fistula  . Esophagogastroduodenoscopy (egd) with esophageal dilation N/A 10/12/2013    Procedure: ESOPHAGOGASTRODUODENOSCOPY (EGD) WITH ESOPHAGEAL DILATION;  Surgeon: Lafayette Dragon, MD;  Location: [facility] ENDOSCOPY;  Service: Endoscopy;  Laterality: N/A;  . Savory dilation N/A 10/12/2013    Procedure: SAVORY DILATION;  Surgeon: Lafayette Dragon, MD;  Location: [facility] ENDOSCOPY;  Service: Endoscopy;  Laterality: N/A;    SOCHx:  reports that she has never smoked. She has never used smokeless tobacco. She reports that she does not drink alcohol or use illicit drugs.  FAMHx: Family History  Problem Relation Age of Onset  . Heart attack Father     ALLERGIES: No Known Allergies  ROS: Review of systems negative except as outlined in history of present illness.  HOME MEDICATIONS: Prior to Admission medications   Medication Sig Start Date End Date Taking? Authorizing Provider  acetaminophen (TYLENOL) 500 MG tablet Take 1,000 mg by mouth every 6 (six) hours as needed (pain).   Yes Historical Provider, MD  amitriptyline (ELAVIL) 10 MG tablet Take 10 mg by mouth at bedtime.    Yes Historical Provider, MD  aspirin EC 81 MG tablet Take 81 mg by mouth daily.   Yes Historical Provider, MD  atorvastatin (LIPITOR) 20 MG tablet Take 1 tablet (20  mg total) by mouth daily. 11/11/13  Yes Velvet Bathe, MD  b complex-vitamin c-folic acid (NEPHRO-VITE) 0.8 MG TABS tablet Take 1 tablet by mouth at bedtime.   Yes Historical Provider, MD  cyanocobalamin (,VITAMIN B-12,) 1000 MCG/ML injection Inject 1,000 mcg into the muscle every 30 (thirty) days.   Yes Historical Provider, MD  febuxostat (ULORIC) 40 MG tablet Take 40 mg by mouth at bedtime.   Yes Historical Provider, MD  fexofenadine (ALLEGRA) 180 MG tablet Take 180 mg by mouth daily as needed for allergies or rhinitis.   Yes Historical  Provider, MD  fish oil-omega-3 fatty acids 1000 MG capsule Take 1 g by mouth daily.    Yes Historical Provider, MD  Glucosamine-Chondroitin 250-200 MG CAPS Take 1 capsule by mouth daily.     Yes Historical Provider, MD  insulin detemir (LEVEMIR) 100 UNIT/ML injection Inject 10 Units into the skin daily.   Yes Historical Provider, MD  insulin regular (NOVOLIN R,HUMULIN R) 100 units/mL injection Inject 0-12 Units into the skin 3 (three) times daily before meals. Per sliding scale: 0-150=0 units, 151-200 = 2 units, 201-250 = 4 units, 251-300 = 6 units, 301-350 = 8 units, 351-400 = 10 units, 401-450 = 12 units, > 450 CALL MD.   Yes Historical Provider, MD  lansoprazole (PREVACID) 30 MG capsule Take 30 mg by mouth daily at 12 noon.   Yes Historical Provider, MD  lanthanum (FOSRENOL) 500 MG chewable tablet Chew 1,000 mg by mouth 3 (three) times daily with meals.    Yes Historical Provider, MD  metoprolol succinate (TOPROL XL) 25 MG 24 hr tablet Take 0.5 tablets (12.5 mg total) by mouth daily. 11/29/13  Yes Scott T Kathlen Mody, PA-C  nystatin (MYCOSTATIN/NYSTOP) 100000 UNIT/GM POWD Apply 1 g topically as needed (affected area).   Yes Historical Provider, MD  OxyCODONE HCl, Abuse Deter, 5 MG TABA Take 5 mg by mouth every 4 (four) hours as needed (pain).    Yes Historical Provider, MD  potassium chloride SA (K-DUR,KLOR-CON) 20 MEQ tablet Take 20 mEq by mouth daily.   Yes Historical Provider, MD  calcitRIOL (ROCALTROL) 0.25 MCG capsule Take 1 capsule (0.25 mcg total) by mouth daily. Patient not taking: Reported on 12/02/2014 10/14/13   Nishant Dhungel, MD  diazepam (VALIUM) 5 MG tablet Take 1 tablet (5 mg total) by mouth every 12 (twelve) hours as needed for anxiety. 11/11/13   Velvet Bathe, MD  feeding supplement, GLUCERNA SHAKE, (GLUCERNA SHAKE) LIQD Take 237 mLs by mouth 2 (two) times daily between meals. Patient not taking: Reported on 12/02/2014 11/11/13   Velvet Bathe, MD  phenol (CHLORASEPTIC) 1.4 % LIQD Use as  directed 1 spray in the mouth or throat 3 (three) times daily as needed for throat irritation / pain.    Historical Provider, MD  polyethylene glycol (MIRALAX / GLYCOLAX) packet Take 17 g by mouth at bedtime.    Historical Provider, MD  sodium bicarbonate 650 MG tablet Take 1 tablet (650 mg total) by mouth daily. Patient not taking: Reported on 12/02/2014 10/14/13   Nishant Dhungel, MD  traMADol (ULTRAM) 50 MG tablet Take 1 tablet (50 mg total) by mouth every 6 (six) hours as needed (pain). 11/11/13   Velvet Bathe, MD    HOSPITAL MEDICATIONS: I have reviewed the patient's current medications.  VITALS: Blood pressure 152/45, pulse 77, temperature 99 F (37.2 C), temperature source Oral, resp. rate 23, SpO2 92 %.  PHYSICAL EXAM: General appearance: alert, cooperative, no distress and chronically ill appearing  Lungs: decreased at bases Heart: regular rate and rhythm and soft systolic murmur Abdomen: soft, non-tender; bowel sounds normal; no masses,  no organomegaly Extremities: no edema Pulses: diminnished Skin: pale, cool, dry Neurologic: Grossly normal  LABS: Results for orders placed or performed during the hospital encounter of 12/02/14 (from the past 24 hour(s))  Basic metabolic panel     Status: Abnormal   Collection Time: 12/02/14 11:59 AM  Result Value Ref Range   Sodium 138 135 - 145 mmol/L   Potassium 4.3 3.5 - 5.1 mmol/L   Chloride 101 96 - 112 mmol/L   CO2 26 19 - 32 mmol/L   Glucose, Bld 102 (H) 70 - 99 mg/dL   BUN 31 (H) 6 - 23 mg/dL   Creatinine, Ser 4.48 (H) 0.50 - 1.10 mg/dL   Calcium 9.3 8.4 - 10.5 mg/dL   GFR calc non Af Amer 8 (L) >90 mL/min   GFR calc Af Amer 9 (L) >90 mL/min   Anion gap 11 5 - 15  CBC with Differential     Status: Abnormal   Collection Time: 12/02/14 11:59 AM  Result Value Ref Range   WBC 11.9 (H) 4.0 - 10.5 K/uL   RBC 3.67 (L) 3.87 - 5.11 MIL/uL   Hemoglobin 11.3 (L) 12.0 - 15.0 g/dL   HCT 34.4 (L) 36.0 - 46.0 %   MCV 93.7 78.0 - 100.0  fL   MCH 30.8 26.0 - 34.0 pg   MCHC 32.8 30.0 - 36.0 g/dL   RDW 14.4 11.5 - 15.5 %   Platelets 248 150 - 400 K/uL   Neutrophils Relative % 82 (H) 43 - 77 %   Neutro Abs 9.9 (H) 1.7 - 7.7 K/uL   Lymphocytes Relative 10 (L) 12 - 46 %   Lymphs Abs 1.2 0.7 - 4.0 K/uL   Monocytes Relative 7 3 - 12 %   Monocytes Absolute 0.8 0.1 - 1.0 K/uL   Eosinophils Relative 1 0 - 5 %   Eosinophils Absolute 0.1 0.0 - 0.7 K/uL   Basophils Relative 0 0 - 1 %   Basophils Absolute 0.0 0.0 - 0.1 K/uL  Troponin I     Status: Abnormal   Collection Time: 12/02/14 11:59 AM  Result Value Ref Range   Troponin I 0.56 (HH) <0.031 ng/mL    IMAGING: Dg Chest 1 View  12/02/2014   CLINICAL DATA:  Fall. Patient on dialysis and did not receive regular dialysis is yesterday due to fever.  EXAM: CHEST  1 VIEW  COMPARISON:  11/14/2013  FINDINGS: Marked cardiac enlargement noted. There is a left plural effusion present. Mild to moderate interstitial edema noted. There is multifocal airspace opacities within the left midlung and left base.  IMPRESSION: 1. Moderate CHF. 2. Multifocal airspace opacities in left lung may represent edema or infection.   Electronically Signed   By: Kerby Moors M.D.   On: 12/02/2014 13:10   Dg Sacrum/coccyx  12/02/2014   CLINICAL DATA:  fall at her nursing facility ([facility]), pt reports pain in her sacrum. Pt is a dialysis pt and did not receive her regular dialysis yesterday due to fever. AP view is obscured. Pt denies any recent contrast studies. Pt unwilling to lay flat due to pain level. Best obtainable images  EXAM: SACRUM AND COCCYX - 2+ VIEW  COMPARISON:  01/01/2014  FINDINGS: New compression fracture deformity of L5 with approximately 50% loss of height. Previous kyphoplasty/ vertebroplasty of L2 stable. Diffuse osteopenia. Old fracture deformities  of the right superior and inferior pubic rami. Bilateral pelvic phleboliths. Extensive radiodense material in the colon as if before.   IMPRESSION: 1. L5 compression fracture deformity, new since prior study. If patient is under consideration for kyphoplasty/vertebroplasty, recommend CT or MR lumbar spine to exclude burst fracture or posterior element involvement.   Electronically Signed   By: Lucrezia Europe M.D.   On: 12/02/2014 13:13    IMPRESSION: Active Problems:   Type 2 diabetes, uncontrolled, with renal manifestation   Essential hypertension   End-stage renal disease (ESRD)   Hypertrophic obstructive cardiomyopathy-peak gradient 51 mmHg Jan 2015   Elevated troponin   Compression fracture of L5 lumbar vertebra   RECOMMENDATION: MD to see. Similar presentation to Jan 2015 when elevated Troponin was felt to be secondary to CHF, renal disease, and demand ischemia.  Doubt ischemic work up indicated.   Time Spent Directly with Patient:  40 minutes  Kerin Ransom, [place] [phone] beeper 12/02/2014, 5:20 PM    Attending note:  Patient seen and examined. Discussed case with Mr. Reino Bellis. Ms. Chimenti presents after a fall at home with resulting L5 compression fracture and back pain. She did not have frank syncope. Her cardiac history includes prior documentation of nonobstructive CAD and HOCM with a gradient of approximately 50 mm in the LVOT based on evaluation last year. She also has end-stage renal disease, and missed her most recent hemodialysis sessions as she was "not feeling well." She has not had any chest pain. As part of her workup, cardiac markers were obtained and her initial troponin I level is 0.56. ECG shows sinus rhythm with increased voltage, left anterior fascicular block, and poor R-wave progression which is more prominent compared to previous tracings. She is being admitted to the family practice teaching service for further evaluation. From a cardiac perspective, would recommend cycling cardiac markers for trend, at this point suspect demand ischemia rather than true ACS. Would follow-up echocardiogram (last study  January 2015) to reassess LVEF and HOCM gradient.  Satira Sark, M.D., F.A.C.C.

## 2014-12-02 NOTE — ED Notes (Signed)
Per EMS, pt had a fall at her nursing facility [facility] facility), pt reports pt in her sacrum. Pt is a dialysis pt and did not receive her regular dialysis yesterday due to fever. Pt received oxycodone 5mg  at 530 and 930 with minimal relief. NAD at this time. Pt alert x4.

## 2014-12-02 NOTE — Consult Note (Signed)
Renal Service Consult Note [facility]  [name] [name] 12/02/2014 Roney Jaffe D Requesting Physician:  Dr Mingo Amber  Reason for Consult:  ESRD patient with fall, sacral pain and pulm edema on CXR HPI: The patient is a 81 y.o. year-old with hx of DM2, HTN and ESRD on HD since 2/15.  She has hx of L2 comp fx treated 1 yr ago w kyphoplasty. She started HD shortly thereafter in FEb 2016.  Patient fell at her SNF today and came to ED. Missed HD due to fall/ pain/ ED visit. Admitted for pain control.  CXR showed pulm edema. Asked to see for ESRD.   Patient mild SOB. SaO2 drops when lying flat but pt cant' sit up for the back pain.  No CP, abd pain, no n/v/d.  Using AVF for HD.  No probs with HD, started in Feb 2015.  No HA, focal weakness, rash, hair loss or confusion.    Chart review: 8/06 - L TKR, pain control, IDDM, HTN 3/08 - chest pain, heart cath neg for sig CAD. HTN, HL, DJD. 5/08 - severe pain,/ reduced ROM R knee > admit for R TKR. Also mod CKD, anemia and HTN 8/10 - spinal stenosis > admit for decompressive lumbar laminectomy; HTN, DM 1/10 - 10/14/13 > fall / on floor for 10 hrs > admitted with a/c renal failure/ rhabdo/ hypoglycemia L2 comp fracture.  Rx w IV fluid adm improved. IR did kyphoplasty. Pt has aspiration event. Also dx with severe HCOM and had acute pulm edema episode. Also dysphagia, Ba swallow showed esoph dysmotility.  DM2. Ecoli pyelo. DC"d to SNF 2/15 - acute on CRF, pulm edema, uncont DM2, HCOM > creat 12. Started on HD.    Past Medical History  Past Medical History  Diagnosis Date  . Type 2 diabetes mellitus     mellitus? x25 years  . Essential hypertension   . Hypercholesterolemia   . ESRD (end stage renal disease)   . Anemia of chronic disease   . CAD (coronary artery disease)     Nonobstructive   . GERD (gastroesophageal reflux disease)   . HOCM (hypertrophic obstructive cardiomyopathy)    Past Surgical History  Past Surgical History   Procedure Laterality Date  . Total knee arthroplasty      L-2006; R-2008   . Appendectomy    . Transthoracic echocardiogram  05/2011  . Av fistula placement  07/07/2012    Procedure: ARTERIOVENOUS (AV) FISTULA CREATION;  Surgeon: Mal Misty, MD;  Location: [facility] OR;  Service: Vascular;  Laterality: Right;  Brachial - cephalic arterivenous fistula  . Esophagogastroduodenoscopy (egd) with esophageal dilation N/A 10/12/2013    Procedure: ESOPHAGOGASTRODUODENOSCOPY (EGD) WITH ESOPHAGEAL DILATION;  Surgeon: Lafayette Dragon, MD;  Location: [facility] ENDOSCOPY;  Service: Endoscopy;  Laterality: N/A;  . Savory dilation N/A 10/12/2013    Procedure: SAVORY DILATION;  Surgeon: Lafayette Dragon, MD;  Location: [facility] ENDOSCOPY;  Service: Endoscopy;  Laterality: N/A;  . Back surgery    . Fracture surgery     Family History  Family History  Problem Relation Age of Onset  . Heart attack Father   . Stroke Mother    Social History  reports that she has never smoked. She has never used smokeless tobacco. She reports that she does not drink alcohol or use illicit drugs. Allergies No Known Allergies Home medications Prior to Admission medications   Medication Sig Start Date End Date Taking? Authorizing Provider  acetaminophen (TYLENOL) 500 MG tablet Take 1,000  mg by mouth every 6 (six) hours as needed (pain).   Yes Historical Provider, MD  amitriptyline (ELAVIL) 10 MG tablet Take 10 mg by mouth at bedtime.    Yes Historical Provider, MD  aspirin EC 81 MG tablet Take 81 mg by mouth daily.   Yes Historical Provider, MD  atorvastatin (LIPITOR) 20 MG tablet Take 1 tablet (20 mg total) by mouth daily. 11/11/13  Yes Velvet Bathe, MD  b complex-vitamin c-folic acid (NEPHRO-VITE) 0.8 MG TABS tablet Take 1 tablet by mouth at bedtime.   Yes Historical Provider, MD  cyanocobalamin (,VITAMIN B-12,) 1000 MCG/ML injection Inject 1,000 mcg into the muscle every 30 (thirty) days.   Yes Historical Provider, MD  febuxostat (ULORIC) 40 MG  tablet Take 40 mg by mouth at bedtime.   Yes Historical Provider, MD  fexofenadine (ALLEGRA) 180 MG tablet Take 180 mg by mouth daily as needed for allergies or rhinitis.   Yes Historical Provider, MD  fish oil-omega-3 fatty acids 1000 MG capsule Take 1 g by mouth daily.    Yes Historical Provider, MD  Glucosamine-Chondroitin 250-200 MG CAPS Take 1 capsule by mouth daily.     Yes Historical Provider, MD  insulin detemir (LEVEMIR) 100 UNIT/ML injection Inject 10 Units into the skin daily.   Yes Historical Provider, MD  insulin regular (NOVOLIN R,HUMULIN R) 100 units/mL injection Inject 0-12 Units into the skin 3 (three) times daily before meals. Per sliding scale: 0-150=0 units, 151-200 = 2 units, 201-250 = 4 units, 251-300 = 6 units, 301-350 = 8 units, 351-400 = 10 units, 401-450 = 12 units, > 450 CALL MD.   Yes Historical Provider, MD  lansoprazole (PREVACID) 30 MG capsule Take 30 mg by mouth daily at 12 noon.   Yes Historical Provider, MD  lanthanum (FOSRENOL) 500 MG chewable tablet Chew 1,000 mg by mouth 3 (three) times daily with meals.    Yes Historical Provider, MD  metoprolol succinate (TOPROL XL) 25 MG 24 hr tablet Take 0.5 tablets (12.5 mg total) by mouth daily. 11/29/13  Yes Scott T Kathlen Mody, PA-C  nystatin (MYCOSTATIN/NYSTOP) 100000 UNIT/GM POWD Apply 1 g topically as needed (affected area).   Yes Historical Provider, MD  OxyCODONE HCl, Abuse Deter, 5 MG TABA Take 5 mg by mouth every 4 (four) hours as needed (pain).    Yes Historical Provider, MD  potassium chloride SA (K-DUR,KLOR-CON) 20 MEQ tablet Take 20 mEq by mouth daily.   Yes Historical Provider, MD  calcitRIOL (ROCALTROL) 0.25 MCG capsule Take 1 capsule (0.25 mcg total) by mouth daily. Patient not taking: Reported on 12/02/2014 10/14/13   Nishant Dhungel, MD  diazepam (VALIUM) 5 MG tablet Take 1 tablet (5 mg total) by mouth every 12 (twelve) hours as needed for anxiety. 11/11/13   Velvet Bathe, MD  feeding supplement, GLUCERNA SHAKE,  (GLUCERNA SHAKE) LIQD Take 237 mLs by mouth 2 (two) times daily between meals. Patient not taking: Reported on 12/02/2014 11/11/13   Velvet Bathe, MD  phenol (CHLORASEPTIC) 1.4 % LIQD Use as directed 1 spray in the mouth or throat 3 (three) times daily as needed for throat irritation / pain.    Historical Provider, MD  polyethylene glycol (MIRALAX / GLYCOLAX) packet Take 17 g by mouth at bedtime.    Historical Provider, MD  sodium bicarbonate 650 MG tablet Take 1 tablet (650 mg total) by mouth daily. Patient not taking: Reported on 12/02/2014 10/14/13   Nishant Dhungel, MD  traMADol (ULTRAM) 50 MG tablet Take 1 tablet (  50 mg total) by mouth every 6 (six) hours as needed (pain). 11/11/13   Velvet Bathe, MD   Liver Function Tests No results for input(s): AST, ALT, ALKPHOS, BILITOT, PROT, ALBUMIN in the last 168 hours. No results for input(s): LIPASE, AMYLASE in the last 168 hours. CBC  Recent Labs Lab 12/02/14 1159  WBC 11.9*  NEUTROABS 9.9*  HGB 11.3*  HCT 34.4*  MCV 93.7  PLT 536   Basic Metabolic Panel  Recent Labs Lab 12/02/14 1159  NA 138  K 4.3  CL 101  CO2 26  GLUCOSE 102*  BUN 31*  CREATININE 4.48*  CALCIUM 9.3    Filed Vitals:   12/02/14 1630 12/02/14 1704 12/02/14 2118 12/02/14 2148  BP: 161/49 152/45 143/47   Pulse: 81 77 81   Temp:  99 F (37.2 C) 99.9 F (37.7 C)   TempSrc:   Oral   Resp: 23  17   Weight:   67.223 kg (148 lb 3.2 oz)   SpO2: 98% 92% 90% 92%   Exam Elderly frail female, no distress. Very weak but responds appopriately No rash, cyanosis or gangrene Sclera anicteric, throat clear No jvd Chest fine basilar rales R > L Irregular rhythm , no RG Abd obese, soft, NTND, no mass or HSM 1+ LE edema  RUA AVF patent Neuro is minimally mobile in the bed due to pain in back/sacrum. Nonfocal, Ox 3  [facility] 3[facility] 17min   F160   400/A1.5   2/2.0 Bath  Heparin 2000   RUA AVF Venofer 50/wk, no other meds    Assessment: 1. Fall / sacral pain /  acute L5 compression fracture 2. Pulm edema / hypoxemia 3. Vol overload - ? Dry wt 4. Hx L2 comp fracture Jan 2015 rx with kyphoplasty 5. ESRD on HD MWF 6. Anemia cont IV Fe 7. MBD cont meds 8. Debility - WC dependent at home per pt 9. HTN / hx HOCM - on low-dose metoprolol XL 12.5 mg daily 10. DM2 on insulin, per primary   Plan- HD tonight, pain control. , max UF with HD  Kelly Splinter MD (pgr) [phone]    ([phone] 12/02/2014, 11:22 PM

## 2014-12-02 NOTE — ED Notes (Signed)
Attempted report x1. 

## 2014-12-02 NOTE — ED Notes (Signed)
Nurse will give pain meds so we can reposition her for an in and out cath

## 2014-12-03 ENCOUNTER — Inpatient Hospital Stay (HOSPITAL_COMMUNITY): Payer: Medicare Other

## 2014-12-03 DIAGNOSIS — W19XXXA Unspecified fall, initial encounter: Secondary | ICD-10-CM

## 2014-12-03 DIAGNOSIS — R7989 Other specified abnormal findings of blood chemistry: Secondary | ICD-10-CM

## 2014-12-03 DIAGNOSIS — I2510 Atherosclerotic heart disease of native coronary artery without angina pectoris: Secondary | ICD-10-CM

## 2014-12-03 DIAGNOSIS — E1165 Type 2 diabetes mellitus with hyperglycemia: Secondary | ICD-10-CM

## 2014-12-03 DIAGNOSIS — I10 Essential (primary) hypertension: Secondary | ICD-10-CM

## 2014-12-03 DIAGNOSIS — N186 End stage renal disease: Secondary | ICD-10-CM

## 2014-12-03 DIAGNOSIS — E1129 Type 2 diabetes mellitus with other diabetic kidney complication: Secondary | ICD-10-CM

## 2014-12-03 DIAGNOSIS — S32050A Wedge compression fracture of fifth lumbar vertebra, initial encounter for closed fracture: Secondary | ICD-10-CM

## 2014-12-03 LAB — GLUCOSE, CAPILLARY
GLUCOSE-CAPILLARY: 247 mg/dL — AB (ref 70–99)
Glucose-Capillary: 99 mg/dL (ref 70–99)
Glucose-Capillary: 178 mg/dL — ABNORMAL HIGH (ref 70–99)
Glucose-Capillary: 189 mg/dL — ABNORMAL HIGH (ref 70–99)
Glucose-Capillary: 228 mg/dL — ABNORMAL HIGH (ref 70–99)

## 2014-12-03 LAB — RENAL FUNCTION PANEL
Albumin: 2.6 g/dL — ABNORMAL LOW (ref 3.5–5.2)
Anion gap: 14 (ref 5–15)
BUN: 15 mg/dL (ref 6–23)
CALCIUM: 8.9 mg/dL (ref 8.4–10.5)
CHLORIDE: 100 mmol/L (ref 96–112)
CO2: 25 mmol/L (ref 19–32)
Creatinine, Ser: 2.79 mg/dL — ABNORMAL HIGH (ref 0.50–1.10)
GFR calc Af Amer: 17 mL/min — ABNORMAL LOW (ref >90)
GFR, EST NON AFRICAN AMERICAN: 14 mL/min — AB (ref >90)
GLUCOSE: 175 mg/dL — AB (ref 70–99)
Phosphorus: 4 mg/dL (ref 2.3–4.6)
Potassium: 4.1 mmol/L (ref 3.5–5.1)
SODIUM: 139 mmol/L (ref 135–145)

## 2014-12-03 LAB — TROPONIN I
TROPONIN I: 0.56 ng/mL — AB (ref <0.031)
Troponin I: 0.83 ng/mL (ref <0.031)

## 2014-12-03 LAB — CBC
HCT: 39.3 % (ref 36.0–46.0)
HEMOGLOBIN: 13.1 g/dL (ref 12.0–15.0)
MCH: 31.3 pg (ref 26.0–34.0)
MCHC: 33.3 g/dL (ref 30.0–36.0)
MCV: 93.8 fL (ref 78.0–100.0)
Platelets: 307 10*3/uL (ref 150–400)
RBC: 4.19 MIL/uL (ref 3.87–5.11)
RDW: 14.7 % (ref 11.5–15.5)
WBC: 13.2 10*3/uL — ABNORMAL HIGH (ref 4.0–10.5)

## 2014-12-03 MED ORDER — PANTOPRAZOLE SODIUM 40 MG PO PACK
40.0000 mg | PACK | Freq: Every day | ORAL | Status: DC
  Administered 2014-12-03 – 2014-12-14 (×11): 40 mg via ORAL
  Filled 2014-12-03 (×14): qty 20

## 2014-12-03 MED ORDER — FENTANYL CITRATE 0.05 MG/ML IJ SOLN
INTRAMUSCULAR | Status: AC
  Filled 2014-12-03: qty 2

## 2014-12-03 MED ORDER — FENTANYL CITRATE 0.05 MG/ML IJ SOLN
12.5000 ug | INTRAMUSCULAR | Status: DC | PRN
  Administered 2014-12-04 – 2014-12-11 (×9): 12.5 ug via INTRAVENOUS
  Filled 2014-12-03 (×10): qty 2

## 2014-12-03 MED ORDER — ASPIRIN 81 MG PO CHEW
81.0000 mg | CHEWABLE_TABLET | Freq: Every day | ORAL | Status: DC
  Administered 2014-12-03 – 2014-12-15 (×13): 81 mg via ORAL
  Filled 2014-12-03 (×12): qty 1

## 2014-12-03 MED ORDER — POLYETHYLENE GLYCOL 3350 17 G PO PACK
17.0000 g | PACK | Freq: Every day | ORAL | Status: DC
  Administered 2014-12-03 – 2014-12-07 (×5): 17 g via ORAL
  Filled 2014-12-03 (×7): qty 1

## 2014-12-03 MED ORDER — METOPROLOL TARTRATE 25 MG/10 ML ORAL SUSPENSION
6.2500 mg | Freq: Two times a day (BID) | ORAL | Status: DC
  Administered 2014-12-03 – 2014-12-13 (×19): 6.25 mg via ORAL
  Filled 2014-12-03 (×24): qty 2.5

## 2014-12-03 NOTE — Progress Notes (Signed)
Family Medicine Teaching Service Daily Progress Note Intern Pager: [phone]  Patient name: [name] [name] Medical record number: [id number] Date of birth: 1926-02-14 Age: 81 y.o. Gender: female  Primary Care Provider: Ann Held, MD Consultants: ortho, cards  Code Status: DNR   Pt Overview and Major Events to Date:  3/12: admitted for fall, found to have an L5 compression fx  3/13: lumbar films ordered   Assessment and Plan: [name] [name] is a 81 y.o. female presenting with fall. PMH is significant for Hx L2 fracture w/ previous kypophlasty, ESRD on HD since 2015, mild non-obstructive CAD on cath in 2008 with evidence of HOCM, DM2, HTN, osteoporosis, GERD and dCHF.   #Fall/L5 compression fx: still in pain.   - fentanyl 25 mcg PO PRN for severe pain - tramadol for moderate pain  - tylenol for mild pain  - Ortho consulted  - lumbar films ordered   #Elevated Troponin/Demand ischemia: hx of having troponins elevated. No reports of SOB or chest pain.  - troponins trending down   #Anemia of Chronic disease/MGUS: Has been seen by Dr. Marin Olp. Baseline hemoglobin 11.8 hematocrit 36.6. Blood studies showed a monoclonal spike of 0.18 g/dL. On a serum immunofixation, a IgG Kappa protein was noted. However, she had normal values of her immunoglobulins. Unsure of how aggressive treatment is.   #acute on chronic dCHF: Received HD last night when she started need some O2  - ECHO   #HOCM,HTN, HLD, CAD: minimal by Cardiac cath 2008  - metoprolol 12.5 mg daily  - continue lipitor   #DM2: controlled. Hgb A1c 6.4 (11/08/13)  - Lantus 5 U  - Sen SSi  - CBG's   #ESRD on HD: secondary to DM2.  Dr. Posey Pronto is her nephrologist  - renal consulted  - HD overnight   #Chronic dysphagia: patient with history of and a barium swallow study performed last year which showed esophageal dysmotility. EGD unremarkable  - protonix   FEN/GI: regular diet/ SLIV   PPx: heparin subQ    Disposition: pending improvement   Subjective:  In pain this morning. 8/10.   Objective: Temp:  [98.2 F (36.8 C)-99.9 F (37.7 C)] 98.2 F (36.8 C) (03/13 0428) Pulse Rate:  [68-81] 69 (03/13 0428) Resp:  [12-24] 18 (03/13 0428) BP: (77-181)/(39-63) 111/40 mmHg (03/13 0428) SpO2:  [90 %-100 %] 100 % (03/13 0428) Weight:  [139 lb 1.8 oz (63.1 kg)-148 lb 3.2 oz (67.223 kg)] 139 lb 1.8 oz (63.1 kg) (03/13 0356) Physical Exam: General: Elderly female, alert, NAD  HEENT: EOMI, PERRL, MMM,  Cardiovascular: S1S2, RRR, murmur heard  Respiratory: bibasilar crackles, no extra effort,  Extremities: moves all freely, distal pulses intact,  Neuro: no gross deficits  MSK: movement of her LE induces pain. Painful to palpation in lumbar right side.   Laboratory:  Recent Labs Lab 12/02/14 1159 12/03/14 0030  WBC 11.9* 13.2*  HGB 11.3* 13.1  HCT 34.4* 39.3  PLT 248 307    Recent Labs Lab 12/02/14 1159 12/03/14 0704  NA 138 139  K 4.3 4.1  CL 101 100  CO2 26 25  BUN 31* 15  CREATININE 4.48* 2.79*  CALCIUM 9.3 8.9  GLUCOSE 102* 175*   Imaging/Diagnostic Tests: Sacrum/coccyx  1. L5 compression fracture deformity, new since prior study. If patient is under consideration for kyphoplasty/vertebroplasty, recommend CT or MR lumbar spine to exclude burst fracture or posterior element involvement.  CXR IMPRESSION: 1. Moderate CHF. 2. Multifocal airspace opacities in left lung may represent  edema or Infection.    Rosemarie Ax, MD 12/03/2014, 9:22 AM PGY-2, [place] Intern pager: [phone], text pages welcome

## 2014-12-03 NOTE — Procedures (Signed)
I was present at this dialysis session, have reviewed the session itself and made  appropriate changes  Kelly Splinter MD (pgr) [phone]    [phone] 12/02/2014, 11:34 PM

## 2014-12-03 NOTE — Consult Note (Addendum)
Reason for Consult:Fell and has Low back Pain. Referring Physician: Dr.Walden  [name] [name] is an 81 y.o. female.  HPI: [place] at home and injured her low back.Her past history was reviewed.She has a history of a Vertebroplasty of L-2.  Past Medical History  Diagnosis Date  . Type 2 diabetes mellitus     mellitus? x25 years  . Essential hypertension   . Hypercholesterolemia   . ESRD (end stage renal disease)   . Anemia of chronic disease   . CAD (coronary artery disease)     Nonobstructive   . GERD (gastroesophageal reflux disease)   . HOCM (hypertrophic obstructive cardiomyopathy)     Past Surgical History  Procedure Laterality Date  . Total knee arthroplasty      L-2006; R-2008   . Appendectomy    . Transthoracic echocardiogram  05/2011  . Av fistula placement  07/07/2012    Procedure: ARTERIOVENOUS (AV) FISTULA CREATION;  Surgeon: Mal Misty, MD;  Location: [facility] OR;  Service: Vascular;  Laterality: Right;  Brachial - cephalic arterivenous fistula  . Esophagogastroduodenoscopy (egd) with esophageal dilation N/A 10/12/2013    Procedure: ESOPHAGOGASTRODUODENOSCOPY (EGD) WITH ESOPHAGEAL DILATION;  Surgeon: Lafayette Dragon, MD;  Location: [facility] ENDOSCOPY;  Service: Endoscopy;  Laterality: N/A;  . Savory dilation N/A 10/12/2013    Procedure: SAVORY DILATION;  Surgeon: Lafayette Dragon, MD;  Location: [facility] ENDOSCOPY;  Service: Endoscopy;  Laterality: N/A;  . Back surgery    . Fracture surgery      Family History  Problem Relation Age of Onset  . Heart attack Father   . Stroke Mother     Social History:  reports that she has never smoked. She has never used smokeless tobacco. She reports that she does not drink alcohol or use illicit drugs.  Allergies: No Known Allergies  Medications: I have reviewed the patient's current medications.  Results for orders placed or performed during the hospital encounter of 12/02/14 (from the past 48 hour(s))  Basic metabolic panel     Status:  Abnormal   Collection Time: 12/02/14 11:59 AM  Result Value Ref Range   Sodium 138 135 - 145 mmol/L   Potassium 4.3 3.5 - 5.1 mmol/L   Chloride 101 96 - 112 mmol/L   CO2 26 19 - 32 mmol/L   Glucose, Bld 102 (H) 70 - 99 mg/dL   BUN 31 (H) 6 - 23 mg/dL   Creatinine, Ser 4.48 (H) 0.50 - 1.10 mg/dL   Calcium 9.3 8.4 - 10.5 mg/dL   GFR calc non Af Amer 8 (L) >90 mL/min   GFR calc Af Amer 9 (L) >90 mL/min    Comment: (NOTE) The eGFR has been calculated using the CKD EPI equation. This calculation has not been validated in all clinical situations. eGFR's persistently <90 mL/min signify possible Chronic Kidney Disease.    Anion gap 11 5 - 15  CBC with Differential     Status: Abnormal   Collection Time: 12/02/14 11:59 AM  Result Value Ref Range   WBC 11.9 (H) 4.0 - 10.5 K/uL   RBC 3.67 (L) 3.87 - 5.11 MIL/uL   Hemoglobin 11.3 (L) 12.0 - 15.0 g/dL   HCT 34.4 (L) 36.0 - 46.0 %   MCV 93.7 78.0 - 100.0 fL   MCH 30.8 26.0 - 34.0 pg   MCHC 32.8 30.0 - 36.0 g/dL   RDW 14.4 11.5 - 15.5 %   Platelets 248 150 - 400 K/uL   Neutrophils Relative %  82 (H) 43 - 77 %   Neutro Abs 9.9 (H) 1.7 - 7.7 K/uL   Lymphocytes Relative 10 (L) 12 - 46 %   Lymphs Abs 1.2 0.7 - 4.0 K/uL   Monocytes Relative 7 3 - 12 %   Monocytes Absolute 0.8 0.1 - 1.0 K/uL   Eosinophils Relative 1 0 - 5 %   Eosinophils Absolute 0.1 0.0 - 0.7 K/uL   Basophils Relative 0 0 - 1 %   Basophils Absolute 0.0 0.0 - 0.1 K/uL  Troponin I     Status: Abnormal   Collection Time: 12/02/14 11:59 AM  Result Value Ref Range   Troponin I 0.56 (HH) <0.031 ng/mL    Comment:        POSSIBLE MYOCARDIAL ISCHEMIA. SERIAL TESTING RECOMMENDED. REPEATED TO VERIFY CRITICAL RESULT CALLED TO, READ BACK BY AND VERIFIED WITH: GYFVCBS [id number] MCCAULEG   Urinalysis, Routine w reflex microscopic     Status: Abnormal   Collection Time: 12/02/14  3:28 PM  Result Value Ref Range   Color, Urine YELLOW YELLOW   APPearance CLEAR CLEAR   Specific  Gravity, Urine 1.015 1.005 - 1.030   pH 7.5 5.0 - 8.0   Glucose, UA 100 (A) NEGATIVE mg/dL   Hgb urine dipstick SMALL (A) NEGATIVE   Bilirubin Urine NEGATIVE NEGATIVE   Ketones, ur NEGATIVE NEGATIVE mg/dL   Protein, ur 100 (A) NEGATIVE mg/dL   Urobilinogen, UA 0.2 0.0 - 1.0 mg/dL   Nitrite NEGATIVE NEGATIVE   Leukocytes, UA SMALL (A) NEGATIVE  Urine microscopic-add on     Status: Abnormal   Collection Time: 12/02/14  3:28 PM  Result Value Ref Range   Squamous Epithelial / LPF FEW (A) RARE   WBC, UA 7-10 <3 WBC/hpf   RBC / HPF 0-2 <3 RBC/hpf   Bacteria, UA RARE RARE  MRSA PCR Screening     Status: None   Collection Time: 12/02/14  5:08 PM  Result Value Ref Range   MRSA by PCR NEGATIVE NEGATIVE    Comment:        The GeneXpert MRSA Assay (FDA approved for NASAL specimens only), is one component of a comprehensive MRSA colonization surveillance program. It is not intended to diagnose MRSA infection nor to guide or monitor treatment for MRSA infections.   Troponin I (q 6hr x 3)     Status: Abnormal   Collection Time: 12/02/14  8:00 PM  Result Value Ref Range   Troponin I 0.69 (HH) <0.031 ng/mL    Comment:        POSSIBLE MYOCARDIAL ISCHEMIA. SERIAL TESTING RECOMMENDED. REPEATED TO VERIFY CRITICAL VALUE NOTED.  VALUE IS CONSISTENT WITH PREVIOUSLY REPORTED AND CALLED VALUE.   Glucose, capillary     Status: None   Collection Time: 12/02/14  9:16 PM  Result Value Ref Range   Glucose-Capillary 96 70 - 99 mg/dL  CBC     Status: Abnormal   Collection Time: 12/03/14 12:30 AM  Result Value Ref Range   WBC 13.2 (H) 4.0 - 10.5 K/uL   RBC 4.19 3.87 - 5.11 MIL/uL   Hemoglobin 13.1 12.0 - 15.0 g/dL   HCT 39.3 36.0 - 46.0 %   MCV 93.8 78.0 - 100.0 fL   MCH 31.3 26.0 - 34.0 pg   MCHC 33.3 30.0 - 36.0 g/dL   RDW 14.7 11.5 - 15.5 %   Platelets 307 150 - 400 K/uL  Troponin I (q 6hr x 3)     Status: Abnormal  Collection Time: 12/03/14 12:30 AM  Result Value Ref Range   Troponin  I 0.83 (HH) <0.031 ng/mL    Comment:        POSSIBLE MYOCARDIAL ISCHEMIA. SERIAL TESTING RECOMMENDED. REPEATED TO VERIFY CRITICAL VALUE NOTED.  VALUE IS CONSISTENT WITH PREVIOUSLY REPORTED AND CALLED VALUE.   Glucose, capillary     Status: None   Collection Time: 12/03/14  4:27 AM  Result Value Ref Range   Glucose-Capillary 99 70 - 99 mg/dL  Troponin I (q 6hr x 3)     Status: Abnormal   Collection Time: 12/03/14  7:04 AM  Result Value Ref Range   Troponin I 0.56 (HH) <0.031 ng/mL    Comment:        POSSIBLE MYOCARDIAL ISCHEMIA. SERIAL TESTING RECOMMENDED. REPEATED TO VERIFY CRITICAL VALUE NOTED.  VALUE IS CONSISTENT WITH PREVIOUSLY REPORTED AND CALLED VALUE.   Renal function panel     Status: Abnormal   Collection Time: 12/03/14  7:04 AM  Result Value Ref Range   Sodium 139 135 - 145 mmol/L   Potassium 4.1 3.5 - 5.1 mmol/L   Chloride 100 96 - 112 mmol/L   CO2 25 19 - 32 mmol/L   Glucose, Bld 175 (H) 70 - 99 mg/dL   BUN 15 6 - 23 mg/dL    Comment: DELTA CHECK NOTED   Creatinine, Ser 2.79 (H) 0.50 - 1.10 mg/dL    Comment: DELTA CHECK NOTED   Calcium 8.9 8.4 - 10.5 mg/dL   Phosphorus 4.0 2.3 - 4.6 mg/dL   Albumin 2.6 (L) 3.5 - 5.2 g/dL   GFR calc non Af Amer 14 (L) >90 mL/min   GFR calc Af Amer 17 (L) >90 mL/min    Comment: (NOTE) The eGFR has been calculated using the CKD EPI equation. This calculation has not been validated in all clinical situations. eGFR's persistently <90 mL/min signify possible Chronic Kidney Disease.    Anion gap 14 5 - 15  Glucose, capillary     Status: Abnormal   Collection Time: 12/03/14  8:22 AM  Result Value Ref Range   Glucose-Capillary 178 (H) 70 - 99 mg/dL    Dg Chest 1 View  12/02/2014   CLINICAL DATA:  Fall. Patient on dialysis and did not receive regular dialysis is yesterday due to fever.  EXAM: CHEST  1 VIEW  COMPARISON:  11/14/2013  FINDINGS: Marked cardiac enlargement noted. There is a left plural effusion present. Mild to  moderate interstitial edema noted. There is multifocal airspace opacities within the left midlung and left base.  IMPRESSION: 1. Moderate CHF. 2. Multifocal airspace opacities in left lung may represent edema or infection.   Electronically Signed   By: Kerby Moors M.D.   On: 12/02/2014 13:10   Dg Sacrum/coccyx  12/02/2014   CLINICAL DATA:  fall at her nursing facility ([facility]), pt reports pain in her sacrum. Pt is a dialysis pt and did not receive her regular dialysis yesterday due to fever. AP view is obscured. Pt denies any recent contrast studies. Pt unwilling to lay flat due to pain level. Best obtainable images  EXAM: SACRUM AND COCCYX - 2+ VIEW  COMPARISON:  01/01/2014  FINDINGS: New compression fracture deformity of L5 with approximately 50% loss of height. Previous kyphoplasty/ vertebroplasty of L2 stable. Diffuse osteopenia. Old fracture deformities of the right superior and inferior pubic rami. Bilateral pelvic phleboliths. Extensive radiodense material in the colon as if before.  IMPRESSION: 1. L5 compression fracture deformity, new since  prior study. If patient is under consideration for kyphoplasty/vertebroplasty, recommend CT or MR lumbar spine to exclude burst fracture or posterior element involvement.   Electronically Signed   By: Lucrezia Europe M.D.   On: 12/02/2014 13:13    Review of Systems  Constitutional: Positive for weight loss and malaise/fatigue.  HENT: Negative.   Eyes: Negative.   Respiratory: Positive for shortness of breath.   Cardiovascular: Negative.   Gastrointestinal: Negative.   Genitourinary: Negative.   Musculoskeletal: Positive for back pain and falls.  Skin: Negative.   Neurological: Negative.   Endo/Heme/Allergies: Negative.   Psychiatric/Behavioral: Positive for memory loss.   Blood pressure 111/40, pulse 69, temperature 98.2 F (36.8 C), temperature source Oral, resp. rate 18, height $RemoveBe'5\' 1"'DEdEztyDd$  (1.549 m), weight 63.1 kg (139 lb 1.8 oz), SpO2 100  %. Physical Exam  Constitutional: She appears distressed.  HENT:  Head: Normocephalic.  Eyes: Pupils are equal, round, and reactive to light.  Neck: Normal range of motion.  Cardiovascular:  Systolic Murmur and heart Tones are distant.  Respiratory:  Breath Sounds are distant  Musculoskeletal:  Diffuse Back Pain in Lower back.  Neurological: Coordination abnormal.  Skin: Skin is warm.  Psychiatric: She has a normal mood and affect.    Assessment/Plan: Bed to chair with help. She does not ambulate on her own.Lumbar spine series ordered.  Sakai Heinle A 12/03/2014, 9:56 AM

## 2014-12-03 NOTE — Progress Notes (Signed)
UR Completed.  [phone]  

## 2014-12-03 NOTE — Progress Notes (Signed)
Subjective:  Complains of low back pain while sitting in bed to eat, no other complaints, no dyspnea  Objective: Vital signs in last 24 hours: Temp:  [98.2 F (36.8 C)-99.9 F (37.7 C)] 98.2 F (36.8 C) (03/13 0428) Pulse Rate:  [68-81] 69 (03/13 0428) Resp:  [12-24] 18 (03/13 0428) BP: (77-181)/(39-63) 111/40 mmHg (03/13 0428) SpO2:  [90 %-100 %] 100 % (03/13 0428) Weight:  [63.1 kg (139 lb 1.8 oz)-67.223 kg (148 lb 3.2 oz)] 63.1 kg (139 lb 1.8 oz) (03/13 0356) Weight change:   Intake/Output from previous day: 03/12 0701 - 03/13 0700 In: 240 [P.O.:240] Out: 3117 [Urine:350] Intake/Output this shift:   Lab Results:  Recent Labs  12/02/14 1159 12/03/14 0030  WBC 11.9* 13.2*  HGB 11.3* 13.1  HCT 34.4* 39.3  PLT 248 307   BMET:  Recent Labs  12/02/14 1159 12/03/14 0704  NA 138 139  K 4.3 4.1  CL 101 100  CO2 26 25  GLUCOSE 102* 175*  BUN 31* 15  CREATININE 4.48* 2.79*  CALCIUM 9.3 8.9  ALBUMIN  --  2.6*   No results for input(s): PTH in the last 72 hours. Iron Studies: No results for input(s): IRON, TIBC, TRANSFERRIN, FERRITIN in the last 72 hours.  Studies/Results: Dg Chest 1 View  12/02/2014   CLINICAL DATA:  Fall. Patient on dialysis and did not receive regular dialysis is yesterday due to fever.  EXAM: CHEST  1 VIEW  COMPARISON:  11/14/2013  FINDINGS: Marked cardiac enlargement noted. There is a left plural effusion present. Mild to moderate interstitial edema noted. There is multifocal airspace opacities within the left midlung and left base.  IMPRESSION: 1. Moderate CHF. 2. Multifocal airspace opacities in left lung may represent edema or infection.   Electronically Signed   By: Kerby Moors M.D.   On: 12/02/2014 13:10   Dg Sacrum/coccyx  12/02/2014   CLINICAL DATA:  fall at her nursing facility ([facility]), pt reports pain in her sacrum. Pt is a dialysis pt and did not receive her regular dialysis yesterday due to fever. AP view is obscured. Pt  denies any recent contrast studies. Pt unwilling to lay flat due to pain level. Best obtainable images  EXAM: SACRUM AND COCCYX - 2+ VIEW  COMPARISON:  01/01/2014  FINDINGS: New compression fracture deformity of L5 with approximately 50% loss of height. Previous kyphoplasty/ vertebroplasty of L2 stable. Diffuse osteopenia. Old fracture deformities of the right superior and inferior pubic rami. Bilateral pelvic phleboliths. Extensive radiodense material in the colon as if before.  IMPRESSION: 1. L5 compression fracture deformity, new since prior study. If patient is under consideration for kyphoplasty/vertebroplasty, recommend CT or MR lumbar spine to exclude burst fracture or posterior element involvement.   Electronically Signed   By: Lucrezia Europe M.D.   On: 12/02/2014 13:13   EXAM: General appearance: Frail, alert, in no apparent distress Resp:  Mild basilar rales (R > L) Cardio: Irregular rhythm, no murmur or rub GI: + BS, soft and nontender Extremities:  Trace edema Access:  AVF @ RUA with + bruit  HD: MWF [facility] 3h 49min  65 kg   F160 400/A1.5 2/2.0 [place] Heparin 2000 RUA AVF Venofer 50/wk, no other meds  Assessment/Plan: 1. Acute L5 compression fracture - sec to fall 3/12. 2. Pulmonary edema / hypoxemia - moderate CHF per CXR 3/12, HD last night. 3. ESRD - HD on MWF @ [facility], K 4.1.  Next HD tomorrow. 4. HTN/Volume - BP 111/40 on  Metoprolol 12.5 mg qd; wt 63.1 kg s/p net UF 2.8 L last night.  Needs lower EDW. 5. Anemia - Hgb 11.3, Venofer qwk. 6. Sec HPT - Ca 8.9 (10 corrected), P 4; Fosrenol 1 g with meals. 7. Nutrition - Alb 2.6. 8. Hx L2 compression fx - s/p kyphoplasty 09/2013. 9. DM 2 - insulin per primary.   LOS: 1 day   LYLES,CHARLES 12/03/2014,9:50 AM  Pt seen, examined and agree w A/P as above.  Kelly Splinter MD pager [phone]    cell [phone] 12/03/2014, 7:57 PM

## 2014-12-03 NOTE — Progress Notes (Signed)
PT Cancellation Note  Patient Details Name: [name] [name] MRN: [id number] DOB: Aug 06, 1926   Cancelled Treatment:    Reason Eval/Treat Not Completed: Patient not medically ready. Pt with elevated troponins trending up at this time. Repeat ECG planned for this morning for further evaluation. Will continue to follow and complete PT eval when appropriate.    Rolinda Roan 12/03/2014, 8:10 AM   Rolinda Roan, PT, DPT Acute Rehabilitation Services Pager: [phone]

## 2014-12-03 NOTE — Progress Notes (Signed)
    Primary cardiologist: Dr. Minus Breeding  Seen for followup: Abnormal troponin I  Subjective:    Resting this AM. Wakes up to voice but sleepy - no chest pain.  Objective:   Temp:  [98.2 F (36.8 C)-99.9 F (37.7 C)] 98.2 F (36.8 C) (03/13 0428) Pulse Rate:  [68-81] 69 (03/13 0428) Resp:  [12-24] 18 (03/13 0428) BP: (77-181)/(39-63) 111/40 mmHg (03/13 0428) SpO2:  [90 %-100 %] 100 % (03/13 0428) Weight:  [139 lb 1.8 oz (63.1 kg)-148 lb 3.2 oz (67.223 kg)] 139 lb 1.8 oz (63.1 kg) (03/13 0356) Last BM Date: 12/02/14  Filed Weights   12/02/14 2118 12/02/14 2326 12/03/14 0356  Weight: 148 lb 3.2 oz (67.223 kg) 144 lb 13.5 oz (65.7 kg) 139 lb 1.8 oz (63.1 kg)    Intake/Output Summary (Last 24 hours) at 12/03/14 0657 Last data filed at 12/03/14 0356  Gross per 24 hour  Intake    240 ml  Output   3117 ml  Net  -2877 ml    Telemetry: Sinus rhythm with PACs and PVCs.  Exam:  General: Frail elderly woman in no distress.  Lungs: Crackles at bases.  Cardiac: RRR with 2/6 systolic murmur, no S3.  Extremities: No pitting.   Lab Results:  Basic Metabolic Panel:  Recent Labs Lab 12/02/14 1159  NA 138  K 4.3  CL 101  CO2 26  GLUCOSE 102*  BUN 31*  CREATININE 4.48*  CALCIUM 9.3    CBC:  Recent Labs Lab 12/02/14 1159 12/03/14 0030  WBC 11.9* 13.2*  HGB 11.3* 13.1  HCT 34.4* 39.3  MCV 93.7 93.8  PLT 248 307    Cardiac Enzymes:  Recent Labs Lab 12/02/14 1159 12/02/14 2000 12/03/14 0030  TROPONINI 0.56* 0.69* 0.83*     Medications:   Scheduled Medications: . amitriptyline  10 mg Oral QHS  . aspirin EC  81 mg Oral Daily  . atorvastatin  20 mg Oral q1800  . febuxostat  40 mg Oral QHS  . heparin  5,000 Units Subcutaneous 3 times per day  . insulin aspart  0-9 Units Subcutaneous TID WC  . insulin glargine  5 Units Subcutaneous QHS  . lanthanum  1,000 mg Oral TID WC  . loratadine  10 mg Oral Daily  . metoprolol succinate  12.5 mg Oral  Daily  . pantoprazole  40 mg Oral Daily  . polyethylene glycol  17 g Oral QHS  . potassium chloride SA  20 mEq Oral Daily  . sodium chloride  3 mL Intravenous Q12H      PRN Medications:  sodium chloride, acetaminophen, diazepam, fentaNYL, sodium chloride, traMADol   Assessment:   1. Demand ischemia, peak troponin I 0.83 so far. No chest pain and ECG nonspecific.  2. Status post fall without syncope.  3. L5 compression fracture secondary to fall.  4. Apparent acute on chronic diastolic heart failure, prior LVEF 65-70% January 2015.  5. HOCM with 50 mmHg LVOT gradient January 2015.  6. Known CAD, nonobstructive 2008.  7. ESRD on HD. Had session yesterday.   Plan/Discussion:    Followup echocardiogram to reassess LVEF and LVOT gradient. Unless there has been significant deterioration, would not pursue ischemic workup aggressively. On ASA, Lipitor, and Toprol XL. Volume management per HD - not on diuretics.   Satira Sark, M.D., F.A.C.C.

## 2014-12-04 DIAGNOSIS — S32050S Wedge compression fracture of fifth lumbar vertebra, sequela: Secondary | ICD-10-CM

## 2014-12-04 LAB — CBC
HCT: 33.1 % — ABNORMAL LOW (ref 36.0–46.0)
Hemoglobin: 10.8 g/dL — ABNORMAL LOW (ref 12.0–15.0)
MCH: 30.9 pg (ref 26.0–34.0)
MCHC: 32.6 g/dL (ref 30.0–36.0)
MCV: 94.6 fL (ref 78.0–100.0)
PLATELETS: 274 10*3/uL (ref 150–400)
RBC: 3.5 MIL/uL — ABNORMAL LOW (ref 3.87–5.11)
RDW: 15 % (ref 11.5–15.5)
WBC: 8.9 10*3/uL (ref 4.0–10.5)

## 2014-12-04 LAB — RENAL FUNCTION PANEL
ANION GAP: 14 (ref 5–15)
Albumin: 2.4 g/dL — ABNORMAL LOW (ref 3.5–5.2)
BUN: 33 mg/dL — AB (ref 6–23)
CHLORIDE: 99 mmol/L (ref 96–112)
CO2: 25 mmol/L (ref 19–32)
Calcium: 9.3 mg/dL (ref 8.4–10.5)
Creatinine, Ser: 4.23 mg/dL — ABNORMAL HIGH (ref 0.50–1.10)
GFR calc Af Amer: 10 mL/min — ABNORMAL LOW (ref >90)
GFR calc non Af Amer: 9 mL/min — ABNORMAL LOW (ref >90)
GLUCOSE: 206 mg/dL — AB (ref 70–99)
Phosphorus: 4.4 mg/dL (ref 2.3–4.6)
Potassium: 5.1 mmol/L (ref 3.5–5.1)
Sodium: 138 mmol/L (ref 135–145)

## 2014-12-04 LAB — GLUCOSE, CAPILLARY
GLUCOSE-CAPILLARY: 283 mg/dL — AB (ref 70–99)
Glucose-Capillary: 264 mg/dL — ABNORMAL HIGH (ref 70–99)
Glucose-Capillary: 347 mg/dL — ABNORMAL HIGH (ref 70–99)

## 2014-12-04 LAB — HEPATITIS B SURFACE ANTIGEN: Hepatitis B Surface Ag: NEGATIVE

## 2014-12-04 MED ORDER — HEPARIN SODIUM (PORCINE) 1000 UNIT/ML DIALYSIS: 1000.0000 [IU] | INTRAMUSCULAR | Status: DC | PRN

## 2014-12-04 MED ORDER — SODIUM CHLORIDE 0.9 % IV SOLN: 100.0000 mL | INTRAVENOUS | Status: DC | PRN

## 2014-12-04 MED ORDER — LIDOCAINE-PRILOCAINE 2.5-2.5 % EX CREA: 1.0000 "application " | TOPICAL_CREAM | CUTANEOUS | Status: DC | PRN

## 2014-12-04 MED ORDER — LIDOCAINE HCL (PF) 1 % IJ SOLN: 5.0000 mL | INTRAMUSCULAR | Status: DC | PRN

## 2014-12-04 MED ORDER — NEPRO/CARBSTEADY PO LIQD: 237.0000 mL | ORAL | Status: DC | PRN

## 2014-12-04 MED ORDER — CALCITONIN (SALMON) 200 UNIT/ACT NA SOLN
1.0000 | Freq: Every day | NASAL | Status: DC
  Administered 2014-12-04 – 2014-12-15 (×12): 1 via NASAL
  Filled 2014-12-04 (×3): qty 3.7

## 2014-12-04 MED ORDER — HEPARIN SODIUM (PORCINE) 1000 UNIT/ML DIALYSIS: 2000.0000 [IU] | INTRAMUSCULAR | Status: DC | PRN

## 2014-12-04 MED ORDER — FENTANYL CITRATE 0.05 MG/ML IJ SOLN
INTRAMUSCULAR | Status: AC
  Filled 2014-12-04: qty 2

## 2014-12-04 MED ORDER — NEPRO/CARBSTEADY PO LIQD
237.0000 mL | ORAL | Status: DC | PRN
  Administered 2014-12-05: 237 mL via ORAL

## 2014-12-04 MED ORDER — ALTEPLASE 2 MG IJ SOLR
2.0000 mg | Freq: Once | INTRAMUSCULAR | Status: AC | PRN
  Filled 2014-12-04: qty 2

## 2014-12-04 MED ORDER — PENTAFLUOROPROP-TETRAFLUOROETH EX AERO: 1.0000 "application " | INHALATION_SPRAY | CUTANEOUS | Status: DC | PRN

## 2014-12-04 MED ORDER — ALTEPLASE 2 MG IJ SOLR: 2.0000 mg | Freq: Once | INTRAMUSCULAR | Status: DC | PRN

## 2014-12-04 NOTE — Progress Notes (Signed)
Telemetry alarming as apnea.  When assessing the patient, she is breathing shallow at approximately 16-20 breaths per minute.  Her O2 level was 90% on Room Air.  She is not complaining of shortness of breath.  She is not complaining of pain as long as she is not being moved.  She received 12.5 IV Fentanyl at 0120.  Placed patient on 2 liters per minute and oxygen level came up to 98%.  MD made aware.  Will continue to monitor patient.  [employer] RN-BC, WTA.

## 2014-12-04 NOTE — Care Management Note (Signed)
CARE MANAGEMENT NOTE 12/04/2014  Patient:  [name] [name], [name] [name]   Account Number:  [id number]  Date Initiated:  12/04/2014  Documentation initiated by:  Dasiah Hooley  Subjective/Objective Assessment:   CM following for progression and d/c planning.     Action/Plan:   Pt is ESRD receiving outpatient HD, following closely for PT eval and ability of pt to travel to outpt HD.   Anticipated DC Date:  12/06/2014   Anticipated DC Plan:  ASSISTED LIVING / REST HOME         Choice offered to / List presented to:             Status of service:   Medicare Important Message given?  YES (If response is "NO", the following Medicare IM given date fields will be blank) Date Medicare IM given:  12/04/2014 Medicare IM given by:  Shaya Altamura Date Additional Medicare IM given:   Additional Medicare IM given by:    Discharge Disposition:    Per UR Regulation:    If discussed at Long Length of Stay Meetings, dates discussed:    Comments:

## 2014-12-04 NOTE — Clinical Social Work Note (Signed)
CSW received consult as patient from [name] [name]. CSW will talk with patient regarding d/c back to facility once medically stable or a skilled facility, based on recommendation from PT/OT.  Brayam Boeke Givens, MSW, LCSW Licensed Clinical Social Worker [place] [phone]

## 2014-12-04 NOTE — Progress Notes (Signed)
Family Medicine Teaching Service Daily Progress Note Intern Pager: [phone]  Patient name: [name] [name] Medical record number: [id number] Date of birth: 1926/09/19 Age: 81 y.o. Gender: female  Primary Care Provider: Ann Held, MD Consultants: ortho, cards  Code Status: DNR   Pt Overview and Major Events to Date:  3/12: admitted for fall, found to have an L5 compression fx  3/13: lumbar films ordered   Assessment and Plan: [name] [name] is a 81 y.o. female presenting with fall. PMH is significant for Hx L2 fracture w/ previous kypophlasty, ESRD on HD since 2015, mild non-obstructive CAD on cath in 2008 with evidence of HOCM, DM2, HTN, osteoporosis, GERD and dCHF.   #Fall/L5 compression fx: still in pain.   - fentanyl 25 mcg PO PRN for severe pain - tramadol for moderate pain  - tylenol for mild pain  - Ortho consulted  - lumbar films ordered  - Starting Calcitonin (nasal spray) - c/s SW  #Elevated Troponin/Demand ischemia: hx of having troponins elevated. No reports of SOB or chest pain.  - troponins trending down   #Anemia of Chronic disease/MGUS: Has been seen by [name] [name]. Baseline hemoglobin 11.8 hematocrit 36.6. Blood studies showed a monoclonal spike of 0.18 g/dL. On a serum immunofixation, a IgG Kappa protein was noted. However, she had normal values of her immunoglobulins. Unsure of how aggressive treatment is.   #acute on chronic dCHF: Received HD last night when she started need some O2  - ECHO   #HOCM,HTN, HLD, CAD: minimal by Cardiac cath 2008  - metoprolol 12.5 mg daily  - continue lipitor   #DM2: controlled. Hgb A1c 6.4 (11/08/13)  - Lantus 5 U  - Sen SSi  - CBG's   #Diarrhea - ongoing for 2 days - C Dif pcr ordered.  #ESRD on HD: secondary to DM2.  [name] [name] is her nephrologist  - renal consulted  - HD overnight   #Chronic dysphagia: patient with history of and a barium swallow study performed last year which showed  esophageal dysmotility. EGD unremarkable  - protonix   FEN/GI: regular diet/ SLIV   PPx: heparin subQ   Disposition: pending improvement   Subjective:  Pain improving today. Patient currently in HD during visit. C/o diarrhea x2 days. Ordered C Dif pcr.  Objective: Temp:  [97.8 F (36.6 C)-98.3 F (36.8 C)] 98 F (36.7 C) (03/14 1053) Pulse Rate:  [56-80] 80 (03/14 1053) Resp:  [16-21] 19 (03/14 1053) BP: (100-131)/(37-56) 131/52 mmHg (03/14 1053) SpO2:  [90 %-100 %] 100 % (03/14 1053) Weight:  [138 lb 0.1 oz (62.6 kg)-140 lb 3.4 oz (63.6 kg)] 138 lb 0.1 oz (62.6 kg) (03/14 1019) Physical Exam: General: Elderly female, alert, NAD  HEENT: EOMI, PERRL, MMM,  Cardiovascular: S1S2, RRR, murmur heard  Respiratory: bibasilar crackles, no extra effort,  Extremities: moves all freely, distal pulses intact,  Neuro: no gross deficits  MSK: movement of her LE induces pain. Painful to palpation in lumbar right side.   Laboratory:  Recent Labs Lab 12/02/14 1159 12/03/14 0030 12/04/14 0716  WBC 11.9* 13.2* 8.9  HGB 11.3* 13.1 10.8*  HCT 34.4* 39.3 33.1*  PLT 248 307 274    Recent Labs Lab 12/02/14 1159 12/03/14 0704 12/04/14 0714  NA 138 139 138  K 4.3 4.1 5.1  CL 101 100 99  CO2 26 25 25   BUN 31* 15 33*  CREATININE 4.48* 2.79* 4.23*  CALCIUM 9.3 8.9 9.3  GLUCOSE 102* 175* 206*   Imaging/Diagnostic Tests:  XR Lumbar Spine 3/13 IMPRESSION: Old compression fracture L2 post spinal augmentation procedure. Old compression fracture L5 suspect slightly increased since 2015 MRI. Diffuse osseous demineralization.  Sacrum/coccyx  1. L5 compression fracture deformity, new since prior study. If patient is under consideration for kyphoplasty/vertebroplasty, recommend CT or MR lumbar spine to exclude burst fracture or posterior element involvement.  CXR IMPRESSION: 1. Moderate CHF. 2. Multifocal airspace opacities in left lung may represent edema or Infection.     [name] Leatherwood, MD 12/04/2014, 12:07 PM PGY-1, [place] Intern pager: [phone], text pages welcome

## 2014-12-04 NOTE — Progress Notes (Signed)
[facility] Progress Note    Assessment/ Plan:   1. Acute L5 compression fracture - sec to fall 3/12. 2. Pulmonary edema / hypoxemia - moderate CHF per CXR 3/12. 3. ESRD - HD on MWF @ [facility], K 4.1. Will re-evaluate tomorrow and maybe switch to TTS temporarily if hosptial census is high. 4. HTN/Volume - BP 111/40on Metoprolol 12.5 mg qd; wt 63.1 kg (lower EDW?). Very emaciated and malnourished. 5. Anemia - Hgb 11.3, Venofer qwk. 6. Sec HPT - Ca 8.9 (10 corrected), P 4; Fosrenol 1 g with meals. 7. Nutrition - Alb 2.6. 8. Hx L2 compression fx - s/p kyphoplasty 09/2013. 9. DM 2 - insulin per primary.  Subjective:   Seen on HD through [facility]. Qb/qd 400/800 U1.5L AP/VP -170/150.  No complaints of cramps but bp 100/60. She only complains of lower back pain, no radiation to legs. No fever, chills, CP.   Objective:   BP 101/42 mmHg  Pulse 77  Temp(Src) 97.8 F (36.6 C) (Oral)  Resp 21  Ht 5\' 1"  (1.549 m)  Wt 63.6 kg (140 lb 3.4 oz)  BMI 26.51 kg/m2  SpO2 100%  LMP  (LMP Unknown)  Intake/Output Summary (Last 24 hours) at 12/04/14 1013 Last data filed at 12/04/14 0600  Gross per 24 hour  Intake    846 ml  Output    575 ml  Net    271 ml   Weight change: -3.623 kg (-7 lb 15.8 oz)  Physical Exam: General appearance: alert and pleasant, in no apparent distress but extremely frail appearing Resp: Mild basilar rales (R > L) Cardio: Irregular rhythm, no murmur or rub GI: + BS, soft and nontender Extremities: Trace edema Access: AVF @ [facility] with + bruit  HD: MWF [facility] 3h 20min 65 kg F160 400/A1.5 2/2.0 [place] Heparin 2000 [facility] Venofer 50/wk, no other meds  Imaging: Dg Chest 1 View  12/02/2014   CLINICAL DATA:  Fall. Patient on dialysis and did not receive regular dialysis is yesterday due to fever.  EXAM: CHEST  1 VIEW  COMPARISON:  11/14/2013  FINDINGS: Marked cardiac enlargement noted. There is a left plural effusion present. Mild to  moderate interstitial edema noted. There is multifocal airspace opacities within the left midlung and left base.  IMPRESSION: 1. Moderate CHF. 2. Multifocal airspace opacities in left lung may represent edema or infection.   Electronically Signed   By: Kerby Moors M.D.   On: 12/02/2014 13:10   Dg Lumbar Spine 2-3 Views  12/03/2014   CLINICAL DATA:  Low back pain since falling 2 days ago, history diabetes, hypertension, end-stage renal disease, coronary artery disease, prior back surgery with unspecified tight  EXAM: LUMBAR SPINE - 2-3 VIEW  COMPARISON:  MRI lumbar spine 01/17/2014, lumbar spine radiographs of 01/01/2014  FINDINGS: Five non-rib-bearing lumbar vertebrae.  Diffuse osseous demineralization.  Old superior endplate compression fracture of L5 vertebral body is identified identified; while this is new since prior radiographs of 01/01/2014, this was identified on the preceding MR though has probably slightly increased in degree of superior endplate compression since the prior MR.  Old superior endplate compression fracture of L2 vertebral body with again identified spinal augmentation procedure.  Remaining vertebra normal in height and alignment.  No definite acute fracture, subluxation or bone destruction.  Assessment of the lower lumbar spine on AP view is limited by superimposed retained contrast material within the colon.  LEFT SI joint preserved, RIGHT obscured.  Atherosclerotic calcifications aorta.  IMPRESSION: Old compression fracture L2 post spinal augmentation procedure.  Old compression fracture L5 suspect slightly increased since 2015 MRI.  Diffuse osseous demineralization.   Electronically Signed   By: Lavonia Dana M.D.   On: 12/03/2014 14:46   Dg Sacrum/coccyx  12/02/2014   CLINICAL DATA:  fall at her nursing facility ([facility]), pt reports pain in her sacrum. Pt is a dialysis pt and did not receive her regular dialysis yesterday due to fever. AP view is obscured. Pt denies  any recent contrast studies. Pt unwilling to lay flat due to pain level. Best obtainable images  EXAM: SACRUM AND COCCYX - 2+ VIEW  COMPARISON:  01/01/2014  FINDINGS: New compression fracture deformity of L5 with approximately 50% loss of height. Previous kyphoplasty/ vertebroplasty of L2 stable. Diffuse osteopenia. Old fracture deformities of the right superior and inferior pubic rami. Bilateral pelvic phleboliths. Extensive radiodense material in the colon as if before.  IMPRESSION: 1. L5 compression fracture deformity, new since prior study. If patient is under consideration for kyphoplasty/vertebroplasty, recommend CT or MR lumbar spine to exclude burst fracture or posterior element involvement.   Electronically Signed   By: Lucrezia Europe M.D.   On: 12/02/2014 13:13    Labs: BMET  Recent Labs Lab 12/02/14 1159 12/03/14 0704 12/04/14 0714  NA 138 139 138  K 4.3 4.1 5.1  CL 101 100 99  CO2 26 25 25   GLUCOSE 102* 175* 206*  BUN 31* 15 33*  CREATININE 4.48* 2.79* 4.23*  CALCIUM 9.3 8.9 9.3  PHOS  --  4.0 4.4   CBC  Recent Labs Lab 12/02/14 1159 12/03/14 0030 12/04/14 0716  WBC 11.9* 13.2* 8.9  NEUTROABS 9.9*  --   --   HGB 11.3* 13.1 10.8*  HCT 34.4* 39.3 33.1*  MCV 93.7 93.8 94.6  PLT 248 307 274    Medications:    . amitriptyline  10 mg Oral QHS  . aspirin  81 mg Oral Daily  . atorvastatin  20 mg Oral q1800  . calcitonin (salmon)  1 spray Alternating Nares Daily  . febuxostat  40 mg Oral QHS  . heparin  5,000 Units Subcutaneous 3 times per day  . insulin aspart  0-9 Units Subcutaneous TID WC  . insulin glargine  5 Units Subcutaneous QHS  . lanthanum  1,000 mg Oral TID WC  . loratadine  10 mg Oral Daily  . metoprolol tartrate  6.25 mg Oral BID  . pantoprazole sodium  40 mg Oral QHS  . polyethylene glycol  17 g Oral Daily  . potassium chloride SA  20 mEq Oral Daily  . sodium chloride  3 mL Intravenous Q12H      Otelia Santee, MD 12/04/2014, 10:13 AM

## 2014-12-04 NOTE — Progress Notes (Addendum)
PT Cancellation Note  Patient Details Name: [name] [name] MRN: [id number] DOB: [birth year]   Cancelled Treatment:    Reason Eval/Treat Not Completed: Patient at procedure or test/unavailable. Pt in HD this morning. Will continue to check back as schedule allows to complete PT eval.   Addendum: Checked back with pt at 1345 to attempt PT eval and pt refusing, stating she is too fatigued from HD. Will check on pt again tomorrow.   Rolinda Roan 12/04/2014, 10:57 AM   Rolinda Roan, PT, DPT Acute Rehabilitation Services Pager: [phone]

## 2014-12-04 NOTE — Clinical Documentation Improvement (Signed)
Current documentation reflects "Very emaciated and malnourished."  Please provide the severity (mild, moderate, severe) and type of the malnutrition present, if known, in your progress notes and carry over to the discharge summary.   Thank you, Mateo Flow, RN [phone] Clinical Documentation Specialist

## 2014-12-04 NOTE — Progress Notes (Signed)
Inpatient Diabetes Program Recommendations  AACE/ADA: New Consensus Statement on Inpatient Glycemic Control (2013)  Target Ranges:  Prepandial:   less than 140 mg/dL      Peak postprandial:   less than 180 mg/dL (1-2 hours)      Critically ill patients:  140 - 180 mg/dL    Results for [name] [name], [name] [name] (MRN [id number]) as of 12/04/2014 12:44  Ref. Range 12/03/2014 08:22 12/03/2014 11:42 12/03/2014 16:17 12/03/2014 20:45  Glucose-Capillary Latest Range: 70-99 mg/dL 178 (H) 189 (H) 228 (H) 247 (H)    Results for [name] [name], [name] [name] (MRN [id number]) as of 12/04/2014 12:44  Ref. Range 12/04/2014 07:14  Glucose Latest Range: 70-99 mg/dL 206 (H)     Home DM Meds: Levemir 10 units daily       Regular 0-12 units tid per SSI  Current Orders: Lantus 5 units QHS      Novolog Sensitive SSI    **Note fasting glucose elevated today.    MD- Please consider changing patient's basal insulin to Levemir 10 units daily (home dose)     Will follow [name] Quaker RN, MSN, CDE Diabetes Coordinator Inpatient Diabetes Program Team Pager: [phone] (8a-5p)

## 2014-12-04 NOTE — Progress Notes (Signed)
Subjective: Feels poorly.  Objective: Vital signs in last 24 hours: Temp:  [97.8 F (36.6 C)-98.3 F (36.8 C)] 98 F (36.7 C) (03/14 1053) Pulse Rate:  [56-80] 80 (03/14 1053) Resp:  [16-21] 19 (03/14 1053) BP: (100-131)/(37-56) 131/52 mmHg (03/14 1053) SpO2:  [90 %-100 %] 100 % (03/14 1053) Weight:  [138 lb 0.1 oz (62.6 kg)-140 lb 3.4 oz (63.6 kg)] 138 lb 0.1 oz (62.6 kg) (03/14 1019) Last BM Date: 12/01/14  Intake/Output from previous day: 03/13 0701 - 03/14 0700 In: 966 [P.O.:960; I.V.:6] Out: 575 [Urine:575] Intake/Output this shift: Total I/O In: -  Out: 1000 [Other:1000]  Medications Current Facility-Administered Medications  Medication Dose Route Frequency Provider Last Rate Last Dose  . 0.9 %  sodium chloride infusion  250 mL Intravenous PRN Rosemarie Ax, MD      . 0.9 %  sodium chloride infusion  100 mL Intravenous PRN Roney Jaffe, MD      . 0.9 %  sodium chloride infusion  100 mL Intravenous PRN Roney Jaffe, MD      . 0.9 %  sodium chloride infusion  100 mL Intravenous PRN Ramiro Harvest, PA-C      . 0.9 %  sodium chloride infusion  100 mL Intravenous PRN Ramiro Harvest, PA-C      . acetaminophen (TYLENOL) tablet 1,000 mg  1,000 mg Oral Q6H PRN Rosemarie Ax, MD      . alteplase (CATHFLO ACTIVASE) injection 2 mg  2 mg Intracatheter Once PRN Roney Jaffe, MD      . alteplase (CATHFLO ACTIVASE) injection 2 mg  2 mg Intracatheter Once PRN Ramiro Harvest, PA-C      . amitriptyline (ELAVIL) tablet 10 mg  10 mg Oral QHS Rosemarie Ax, MD   10 mg at 12/03/14 2249  . aspirin chewable tablet 81 mg  81 mg Oral Daily Alveda Reasons, MD   81 mg at 12/04/14 1103  . atorvastatin (LIPITOR) tablet 20 mg  20 mg Oral q1800 Rosemarie Ax, MD   20 mg at 12/03/14 1639  . calcitonin (salmon) (MIACALCIN/FORTICAL) nasal spray 1 spray  1 spray Alternating Nares Daily Elberta Leatherwood, MD      . diazepam (VALIUM) tablet 5 mg  5 mg Oral Q12H PRN Rosemarie Ax, MD   5  mg at 12/03/14 1158  . febuxostat (ULORIC) tablet 40 mg  40 mg Oral QHS Rosemarie Ax, MD   40 mg at 12/03/14 2249  . feeding supplement (NEPRO CARB STEADY) liquid 237 mL  237 mL Oral PRN Roney Jaffe, MD      . feeding supplement (NEPRO CARB STEADY) liquid 237 mL  237 mL Oral PRN Ramiro Harvest, PA-C      . fentaNYL (SUBLIMAZE) injection 12.5 mcg  12.5 mcg Intravenous Q4H PRN Elberta Leatherwood, MD   12.5 mcg at 12/04/14 2376  . heparin injection 1,000 Units  1,000 Units Dialysis PRN Roney Jaffe, MD      . heparin injection 1,000 Units  1,000 Units Dialysis PRN Ramiro Harvest, PA-C      . heparin injection 2,000 Units  2,000 Units Dialysis PRN Roney Jaffe, MD      . heparin injection 5,000 Units  5,000 Units Subcutaneous 3 times per day Rosemarie Ax, MD   5,000 Units at 12/03/14 2248  . insulin aspart (novoLOG) injection 0-9 Units  0-9 Units Subcutaneous TID WC Rosemarie Ax, MD   3 Units at 12/03/14 1640  .  insulin glargine (LANTUS) injection 5 Units  5 Units Subcutaneous QHS Rosemarie Ax, MD   5 Units at 12/03/14 2249  . lanthanum (FOSRENOL) chewable tablet 1,000 mg  1,000 mg Oral TID WC Rosemarie Ax, MD   1,000 mg at 12/03/14 1639  . lidocaine (PF) (XYLOCAINE) 1 % injection 5 mL  5 mL Intradermal PRN Roney Jaffe, MD      . lidocaine (PF) (XYLOCAINE) 1 % injection 5 mL  5 mL Intradermal PRN Ramiro Harvest, PA-C      . lidocaine-prilocaine (EMLA) cream 1 application  1 application Topical PRN Roney Jaffe, MD      . lidocaine-prilocaine (EMLA) cream 1 application  1 application Topical PRN Ramiro Harvest, PA-C      . loratadine (CLARITIN) tablet 10 mg  10 mg Oral Daily Rosemarie Ax, MD   10 mg at 12/04/14 1103  . metoprolol tartrate (LOPRESSOR) 25 mg/10 mL oral suspension 6.25 mg  6.25 mg Oral BID Alveda Reasons, MD   6.25 mg at 12/04/14 1103  . pantoprazole sodium (PROTONIX) 40 mg/20 mL oral suspension 40 mg  40 mg Oral QHS Alveda Reasons, MD   40 mg at 12/03/14 2248   . pentafluoroprop-tetrafluoroeth (GEBAUERS) aerosol 1 application  1 application Topical PRN Roney Jaffe, MD      . pentafluoroprop-tetrafluoroeth (GEBAUERS) aerosol 1 application  1 application Topical PRN Ramiro Harvest, PA-C      . polyethylene glycol (MIRALAX / GLYCOLAX) packet 17 g  17 g Oral Daily Elberta Leatherwood, MD   17 g at 12/04/14 1103  . potassium chloride SA (K-DUR,KLOR-CON) CR tablet 20 mEq  20 mEq Oral Daily Rosemarie Ax, MD   20 mEq at 12/04/14 1103  . sodium chloride 0.9 % injection 3 mL  3 mL Intravenous Q12H Rosemarie Ax, MD   3 mL at 12/04/14 1103  . sodium chloride 0.9 % injection 3 mL  3 mL Intravenous PRN Rosemarie Ax, MD      . traMADol Veatrice Bourbon) tablet 50 mg  50 mg Oral Q6H PRN Rosemarie Ax, MD   50 mg at 12/04/14 1104    PE: General appearance: alert, cooperative and no distress.  Laying flat. Lungs: Poor effort.  Decreased BS.  No wheezing.  Heart: regular rate and rhythm and 2/6 sys MM in the axillae Abdomen: +BS, Nontender Extremities: No LEE Pulses: 2+ and symmetric Skin: Warm and dry Neurologic: Grossly normal  Lab Results:   Recent Labs  12/02/14 1159 12/03/14 0030 12/04/14 0716  WBC 11.9* 13.2* 8.9  HGB 11.3* 13.1 10.8*  HCT 34.4* 39.3 33.1*  PLT 248 307 274   BMET  Recent Labs  12/02/14 1159 12/03/14 0704 12/04/14 0714  NA 138 139 138  K 4.3 4.1 5.1  CL 101 100 99  CO2 26 25 25   GLUCOSE 102* 175* 206*  BUN 31* 15 33*  CREATININE 4.48* 2.79* 4.23*  CALCIUM 9.3 8.9 9.3      Assessment/Plan 1. Demand ischemia, peak troponin I 0.83 so far. No chest pain and ECG nonspecific. Waiting on echo.  Per Dr. Domenic Polite: Unless there has been significant deterioration, would not pursue ischemic workup aggressively.  11 beat NSVT on tele.  i would not increase her BB. BP low. 2. Status post fall without syncope.  3. L5 compression fracture secondary to fall.  4. Apparent acute on chronic diastolic heart failure, prior LVEF  65-70% January 2015. Net fluids:  +0.4L/-2.5L.  1.0L  off in HD today.  Appears euvolemic.   5. HOCM with 50 mmHg LVOT gradient January 2015.  On lopressor 6.25mg  oral suspension BID.   6. Known CAD, nonobstructive 2008.  7. ESRD on HD. Had session yesterday.    LOS: 2 days    HAGER, BRYAN PA-C 12/04/2014 11:39 AM Patient was hospitalized after a mechanical fall at home. Denies syncope. Denies chest pain. Her echo is pending.  BP running low so will not increase her BB.  Rhythm is NSR. Agree with assessment as noted above.

## 2014-12-04 NOTE — Progress Notes (Signed)
OT Cancellation Note  Patient Details Name: [name] [name] MRN: [id number] DOB: 07-Jul-1926   Cancelled Treatment:    Reason Eval/Treat Not Completed: Other (comment). PT just let me know that she had spoken with pt and pt reports she is just too fatigued from hemo this AM for therapy. Will try for evaluation again tomorrow.  Almon Register [id number] 12/04/2014, 2:25 PM

## 2014-12-05 ENCOUNTER — Inpatient Hospital Stay (HOSPITAL_COMMUNITY): Payer: Medicare Other

## 2014-12-05 LAB — GLUCOSE, CAPILLARY
Glucose-Capillary: 212 mg/dL — ABNORMAL HIGH (ref 70–99)
Glucose-Capillary: 185 mg/dL — ABNORMAL HIGH (ref 70–99)
Glucose-Capillary: 357 mg/dL — ABNORMAL HIGH (ref 70–99)
Glucose-Capillary: 247 mg/dL — ABNORMAL HIGH (ref 70–99)

## 2014-12-05 LAB — URINE CULTURE

## 2014-12-05 MED ORDER — SORBITOL 70 % SOLN
30.0000 mL | Freq: Four times a day (QID) | Status: DC | PRN
  Administered 2014-12-08: 30 mL via ORAL
  Filled 2014-12-05 (×4): qty 30

## 2014-12-05 MED ORDER — DOCUSATE SODIUM 100 MG PO CAPS
100.0000 mg | ORAL_CAPSULE | Freq: Every day | ORAL | Status: DC
  Filled 2014-12-05: qty 1

## 2014-12-05 MED ORDER — RENA-VITE PO TABS
1.0000 | ORAL_TABLET | Freq: Every day | ORAL | Status: DC
  Administered 2014-12-05 – 2014-12-14 (×10): 1 via ORAL
  Filled 2014-12-05 (×12): qty 1

## 2014-12-05 MED ORDER — BOOST / RESOURCE BREEZE PO LIQD
1.0000 | Freq: Three times a day (TID) | ORAL | Status: DC
  Administered 2014-12-05 – 2014-12-14 (×17): 1 via ORAL

## 2014-12-05 NOTE — Progress Notes (Signed)
Family Medicine Teaching Service Daily Progress Note Intern Pager: [phone]  Patient name: [name] [name] Medical record number: [id number] Date of birth: January 13, 1926 Age: 81 y.o. Gender: female  Primary Care Provider: Ann Held, MD Consultants: ortho, cards  Code Status: DNR   Pt Overview and Major Events to Date:  3/12: admitted for fall, found to have an L5 compression fx  3/13: lumbar films ordered  3/15: MRI lumbar spine to eval for vertebroplasty; per ortho  Assessment and Plan: [name] [name] is a 81 y.o. female presenting with fall. PMH is significant for Hx L2 fracture w/ previous kypophlasty, ESRD on HD since 2015, mild non-obstructive CAD on cath in 2008 with evidence of HOCM, DM2, HTN, osteoporosis, GERD and dCHF.   #Fall/L5 compression fx: still in pain.   - fentanyl 25 mcg PO PRN for severe pain - tramadol for moderate pain  - tylenol for mild pain  - Ortho consulted >> Lumbar MRI obtained. - lumbar films ordered  - Starting Calcitonin (nasal spray) - c/s SW for possible SNF placement - f/u PT/OT  #Elevated Troponin/Demand ischemia: hx of having troponins elevated. No reports of SOB or chest pain.  - troponins trending down   #Anemia of Chronic disease/MGUS: Has been seen by Dr. Marin Olp. Baseline hemoglobin 11.8 hematocrit 36.6. Blood studies showed a monoclonal spike of 0.18 g/dL. On a serum immunofixation, a IgG Kappa protein was noted. However, she had normal values of her immunoglobulins. Unsure of how aggressive treatment is.   #acute on chronic dCHF: Received HD last night when she started need some O2  - ECHO   #HOCM,HTN, HLD, CAD: minimal by Cardiac cath 2008  - metoprolol 12.5 mg daily  - continue lipitor   #DM2: controlled. Hgb A1c 6.4 (11/08/13)  - Lantus 5 U  - Sen SSi  - CBG's   #Diarrhea - ongoing for 2 days - C Dif pcr ordered.  #ESRD on HD: secondary to DM2.  Dr. Posey Pronto is her nephrologist  - renal consulted  - HD  overnight   #Chronic dysphagia: patient with history of and a barium swallow study performed last year which showed esophageal dysmotility. EGD unremarkable  - protonix   FEN/GI: regular diet/ SLIV   PPx: heparin subQ   Disposition: pending improvement   Subjective:  Pain improving today. No signifiacnt changes today.  Objective: Temp:  [97.5 F (36.4 C)-98.3 F (36.8 C)] 97.8 F (36.6 C) (03/15 1106) Pulse Rate:  [60-65] 60 (03/15 1106) Resp:  [16-18] 17 (03/15 1106) BP: (123-142)/(29-61) 142/61 mmHg (03/15 1106) SpO2:  [99 %-100 %] 99 % (03/15 1106) Weight:  [139 lb 5.3 oz (63.2 kg)] 139 lb 5.3 oz (63.2 kg) (03/14 2144) Physical Exam: General: Elderly female, alert, NAD  HEENT: EOMI, PERRL, MMM,  Cardiovascular: S1S2, RRR, murmur heard  Respiratory: bibasilar crackles, no extra effort,  Extremities: moves all freely, distal pulses intact,  Neuro: no gross deficits  MSK: movement of her LE induces pain (improved). Painful to palpation in lumbar right side.   Laboratory:  Recent Labs Lab 12/02/14 1159 12/03/14 0030 12/04/14 0716  WBC 11.9* 13.2* 8.9  HGB 11.3* 13.1 10.8*  HCT 34.4* 39.3 33.1*  PLT 248 307 274    Recent Labs Lab 12/02/14 1159 12/03/14 0704 12/04/14 0714  NA 138 139 138  K 4.3 4.1 5.1  CL 101 100 99  CO2 26 25 25   BUN 31* 15 33*  CREATININE 4.48* 2.79* 4.23*  CALCIUM 9.3 8.9 9.3  GLUCOSE 102*  175* 206*   Imaging/Diagnostic Tests: XR Lumbar Spine 3/13 IMPRESSION: Old compression fracture L2 post spinal augmentation procedure. Old compression fracture L5 suspect slightly increased since 2015 MRI. Diffuse osseous demineralization.  Sacrum/coccyx  1. L5 compression fracture deformity, new since prior study. If patient is under consideration for kyphoplasty/vertebroplasty, recommend CT or MR lumbar spine to exclude burst fracture or posterior element involvement.  CXR IMPRESSION: 1. Moderate CHF. 2. Multifocal airspace  opacities in left lung may represent edema or Infection.    Elberta Leatherwood, MD 12/05/2014, 1:40 PM PGY-1, [facility] Intern pager: [phone], text pages welcome

## 2014-12-05 NOTE — Progress Notes (Signed)
Echocardiogram 2D Echocardiogram has been performed.  [name] [name] 12/05/2014, 11:45 AM

## 2014-12-05 NOTE — Progress Notes (Signed)
Patient Name: [name] [name] Date of Encounter: 12/05/2014     Active Problems:   Type 2 diabetes, uncontrolled, with renal manifestation   Essential hypertension   End-stage renal disease (ESRD)   Hypertrophic obstructive cardiomyopathy-peak gradient 51 mmHg Jan 2015   Malnutrition of moderate degree   Elevated troponin   Compression fracture of L5 lumbar vertebra   Compression fracture   Fall    SUBJECTIVE  Denies any CP or SOB. Back from MRI. Just got echocardiogram. Laying on her L side, stating she has significant back pain  CURRENT MEDS . amitriptyline  10 mg Oral QHS  . aspirin  81 mg Oral Daily  . atorvastatin  20 mg Oral q1800  . calcitonin (salmon)  1 spray Alternating Nares Daily  . docusate sodium  100 mg Oral Daily  . febuxostat  40 mg Oral QHS  . feeding supplement (RESOURCE BREEZE)  1 Container Oral TID BM  . heparin  5,000 Units Subcutaneous 3 times per day  . insulin aspart  0-9 Units Subcutaneous TID WC  . insulin glargine  5 Units Subcutaneous QHS  . lanthanum  1,000 mg Oral TID WC  . loratadine  10 mg Oral Daily  . metoprolol tartrate  6.25 mg Oral BID  . multivitamin  1 tablet Oral QHS  . pantoprazole sodium  40 mg Oral QHS  . polyethylene glycol  17 g Oral Daily  . sodium chloride  3 mL Intravenous Q12H    OBJECTIVE  Filed Vitals:   12/04/14 1019 12/04/14 1053 12/04/14 2144 12/05/14 0513  BP: 130/50 131/52 126/48 123/29  Pulse: 80 80 65 62  Temp: 98 F (36.7 C) 98 F (36.7 C) 98.3 F (36.8 C) 97.5 F (36.4 C)  TempSrc: Oral Oral Oral Oral  Resp: 20 19 18 16   Height:      Weight: 138 lb 0.1 oz (62.6 kg)  139 lb 5.3 oz (63.2 kg)   SpO2: 100% 100% 100% 100%    Intake/Output Summary (Last 24 hours) at 12/05/14 0924 Last data filed at 12/05/14 0515  Gross per 24 hour  Intake    660 ml  Output   1075 ml  Net   -415 ml   Filed Weights   12/04/14 0634 12/04/14 1019 12/04/14 2144  Weight: 140 lb 3.4 oz (63.6 kg) 138 lb 0.1 oz (62.6  kg) 139 lb 5.3 oz (63.2 kg)    PHYSICAL EXAM  General: Pleasant, NAD. Neuro: Alert and oriented X 3. Moves all extremities spontaneously. Psych: Normal affect. HEENT:  Normal  Neck: Supple without bruits or JVD. Lungs:  Resp regular and unlabored, CTA. Heart: RRR no s3, s4, or murmurs. Abdomen: Soft, non-tender, non-distended, BS + x 4.  Extremities: No clubbing, cyanosis or edema. DP/PT/Radials 2+ and equal bilaterally.  Accessory Clinical Findings  CBC  Recent Labs  12/02/14 1159 12/03/14 0030 12/04/14 0716  WBC 11.9* 13.2* 8.9  NEUTROABS 9.9*  --   --   HGB 11.3* 13.1 10.8*  HCT 34.4* 39.3 33.1*  MCV 93.7 93.8 94.6  PLT 248 307 096   Basic Metabolic Panel  Recent Labs  12/03/14 0704 12/04/14 0714  NA 139 138  K 4.1 5.1  CL 100 99  CO2 25 25  GLUCOSE 175* 206*  BUN 15 33*  CREATININE 2.79* 4.23*  CALCIUM 8.9 9.3  PHOS 4.0 4.4   Liver Function Tests  Recent Labs  12/03/14 0704 12/04/14 0714  ALBUMIN 2.6* 2.4*   Cardiac Enzymes  Recent Labs  12/02/14 2000 12/03/14 0030 12/03/14 0704  TROPONINI 0.69* 0.83* 0.56*    ECG  No new EKG  Echocardiogram  pending    Radiology/Studies  Dg Chest 1 View  12/02/2014   CLINICAL DATA:  Fall. Patient on dialysis and did not receive regular dialysis is yesterday due to fever.  EXAM: CHEST  1 VIEW  COMPARISON:  11/14/2013  FINDINGS: Marked cardiac enlargement noted. There is a left plural effusion present. Mild to moderate interstitial edema noted. There is multifocal airspace opacities within the left midlung and left base.  IMPRESSION: 1. Moderate CHF. 2. Multifocal airspace opacities in left lung may represent edema or infection.   Electronically Signed   By: Kerby Moors M.D.   On: 12/02/2014 13:10   Dg Lumbar Spine 2-3 Views  12/03/2014   CLINICAL DATA:  Low back pain since falling 2 days ago, history diabetes, hypertension, end-stage renal disease, coronary artery disease, prior back surgery with  unspecified tight  EXAM: LUMBAR SPINE - 2-3 VIEW  COMPARISON:  MRI lumbar spine 01/17/2014, lumbar spine radiographs of 01/01/2014  FINDINGS: Five non-rib-bearing lumbar vertebrae.  Diffuse osseous demineralization.  Old superior endplate compression fracture of L5 vertebral body is identified identified; while this is new since prior radiographs of 01/01/2014, this was identified on the preceding MR though has probably slightly increased in degree of superior endplate compression since the prior MR.  Old superior endplate compression fracture of L2 vertebral body with again identified spinal augmentation procedure.  Remaining vertebra normal in height and alignment.  No definite acute fracture, subluxation or bone destruction.  Assessment of the lower lumbar spine on AP view is limited by superimposed retained contrast material within the colon.  LEFT SI joint preserved, RIGHT obscured.  Atherosclerotic calcifications aorta.  IMPRESSION: Old compression fracture L2 post spinal augmentation procedure.  Old compression fracture L5 suspect slightly increased since 2015 MRI.  Diffuse osseous demineralization.   Electronically Signed   By: Lavonia Dana M.D.   On: 12/03/2014 14:46   Dg Sacrum/coccyx  12/02/2014   CLINICAL DATA:  fall at her nursing facility ([facility]), pt reports pain in her sacrum. Pt is a dialysis pt and did not receive her regular dialysis yesterday due to fever. AP view is obscured. Pt denies any recent contrast studies. Pt unwilling to lay flat due to pain level. Best obtainable images  EXAM: SACRUM AND COCCYX - 2+ VIEW  COMPARISON:  01/01/2014  FINDINGS: New compression fracture deformity of L5 with approximately 50% loss of height. Previous kyphoplasty/ vertebroplasty of L2 stable. Diffuse osteopenia. Old fracture deformities of the right superior and inferior pubic rami. Bilateral pelvic phleboliths. Extensive radiodense material in the colon as if before.  IMPRESSION: 1. L5  compression fracture deformity, new since prior study. If patient is under consideration for kyphoplasty/vertebroplasty, recommend CT or MR lumbar spine to exclude burst fracture or posterior element involvement.   Electronically Signed   By: Lucrezia Europe M.D.   On: 12/02/2014 13:13    ASSESSMENT AND PLAN  1. Demand ischemia, peak troponin I 0.83 so far. No chest pain and ECG nonspecific. - Waiting on echo. Per Dr. Domenic Polite: Unless there has been significant deterioration, would not pursue ischemic workup aggressively. - unclear why echo was not done since it was ordered on Sat, I have called the echo lab, they will make sure it is done today  2. Status post fall without syncope.  3. L5 compression fracture secondary to fall.  -  pending MRI and considering for candidacy for vertebroplasty today  4. Apparent acute on chronic diastolic heart failure, prior LVEF 65-70% January 2015. Net fluids: +0.4L/-2.5L. 1.0L off in HD today. Appears euvolemic.   5. HOCM with 50 mmHg LVOT gradient January 2015. On lopressor 6.25mg  oral suspension BID.   6. Known CAD, nonobstructive 2008.  7. ESRD on HD  Signed, Woodward Ku Pager: [phone] The echocardiogram has now been done.  The results are pending.  The patient is not having any chest discomfort.  Her only discomfort is from her back pain secondary to her L5 compression fracture secondary to mechanical fall.  Physical examination reveals a grade 2/6 systolic ejection murmur heard at the base.  No gallop or rub.  The lungs are clear. Agree with assessment and plans as above.

## 2014-12-05 NOTE — Progress Notes (Signed)
Inpatient Diabetes Program Recommendations  AACE/ADA: New Consensus Statement on Inpatient Glycemic Control (2013)  Target Ranges:  Prepandial:   less than 140 mg/dL      Peak postprandial:   less than 180 mg/dL (1-2 hours)      Critically ill patients:  140 - 180 mg/dL    Results for MELANI, BRISBANE (MRN [id number]) as of 12/05/2014 11:48  Ref. Range 12/04/2014 12:10 12/04/2014 17:18 12/04/2014 21:40  Glucose-Capillary Latest Range: 70-99 mg/dL 264 (H) 283 (H) 347 (H)    Results for DEJIA, EBRON (MRN [id number]) as of 12/05/2014 11:48  Ref. Range 12/05/2014 07:52 12/05/2014 11:43  Glucose-Capillary Latest Range: 70-99 mg/dL 212 (H) 185 (H)    Home DM Meds: Levemir 10 units daily  Regular 0-12 units tid per SSI  Current Orders: Lantus 5 units QHS  Novolog Sensitive SSI    **Note fasting glucose elevated today.    MD- Please consider changing patient's basal insulin to Levemir 10 units daily (home dose)  Please also consider increasing SSI to Moderate scale   Will follow Wyn Quaker RN, MSN, CDE Diabetes Coordinator Inpatient Diabetes Program Team Pager: [phone] (8a-5p)

## 2014-12-05 NOTE — Progress Notes (Signed)
Patient went to MRI premedicated with Fentanyl 12.5, planning to hold on valium prn which she can get at 1047 am today per Dr.

## 2014-12-05 NOTE — Progress Notes (Signed)
OT Cancellation Note  Patient Details Name: [name] [name] MRN: [id number] DOB: [birth year]   Cancelled Treatment:    Reason Eval/Treat Not Completed: Patient at procedure or test/ unavailable. Pt in MRI.  Almon Register [phone] 12/05/2014, 10:04 AM

## 2014-12-05 NOTE — Progress Notes (Signed)
Patient up with PT with the lumbar brace in chair for over an hour, tolerated well with no c/o.

## 2014-12-05 NOTE — Progress Notes (Signed)
Subjective:     Patient reports pain as 5 on 0-10 scale.Patient states that she is in too much pain to get out of bed.She is able to move her legs and it is difficult to get her to move very much because of pain in her back. She has significant pain on palpation directly over her Sacrum and L-5 level. I spoke with her daughter yesterday and explained her situation. When able,we will try to obtain an MRI and ask radiology if she is a candidate for a Vertebroplasty. An MRI must be done first. Will ask [facility] to get a Lumbosacral corset for her as well to wear IF she can get up.    Objective: Vital signs in last 24 hours: Temp:  [97.5 F (36.4 C)-98.3 F (36.8 C)] 97.5 F (36.4 C) (03/15 0513) Pulse Rate:  [62-80] 62 (03/15 0513) Resp:  [16-21] 16 (03/15 0513) BP: (100-131)/(29-53) 123/29 mmHg (03/15 0513) SpO2:  [100 %] 100 % (03/15 0513) Weight:  [62.6 kg (138 lb 0.1 oz)-63.2 kg (139 lb 5.3 oz)] 63.2 kg (139 lb 5.3 oz) (03/14 2144)  Intake/Output from previous day: 03/14 0701 - 03/15 0700 In: 660 [P.O.:660] Out: 1075 [Urine:75] Intake/Output this shift:     Recent Labs  12/02/14 1159 12/03/14 0030 12/04/14 0716  HGB 11.3* 13.1 10.8*    Recent Labs  12/03/14 0030 12/04/14 0716  WBC 13.2* 8.9  RBC 4.19 3.50*  HCT 39.3 33.1*  PLT 307 274    Recent Labs  12/03/14 0704 12/04/14 0714  NA 139 138  K 4.1 5.1  CL 100 99  CO2 25 25  BUN 15 33*  CREATININE 2.79* 4.23*  GLUCOSE 175* 206*  CALCIUM 8.9 9.3   No results for input(s): LABPT, INR in the last 72 hours.  Severe pain over Sacrum.  Assessment/Plan:     Up with therapy when able. MRI of Lumbar Spine to evaluate for possible Vertebroplasty.  Ladarious Kresse A 12/05/2014, 7:09 AM

## 2014-12-05 NOTE — Evaluation (Signed)
Physical Therapy Evaluation Patient Details Name: [name] [name] MRN: [id number] DOB: 12-02-1925 Today's Date: 12/05/2014   History of Present Illness  81 yo F with PMH noted for L2 fx and kyphoplasty, ESRD on HD, CAD, DM2, HTN who presents with low back pain s/p fall yesterday. Found to have L5 compression fracture on initial films. Lumbar corset provided. MRI on 12/05/14 to evaluate whether pt is a candidate for vertebroplasty.   Clinical Impression  Pt admitted with above diagnosis. Pt currently with functional limitations due to the deficits listed below (see PT Problem List). At the time of PT eval pt was able to perform transfers with +2 max-total assist. Per activity orders, pt is bed to chair only at this time. Pt will benefit from skilled PT to increase their independence and safety with mobility to allow discharge to the venue listed below.  Feel that pt will require SNF at d/c for further management of pain and mobility deficits.      Follow Up Recommendations SNF;Supervision/Assistance - 24 hour    Equipment Recommendations  None recommended by PT    Recommendations for Other Services       Precautions / Restrictions Precautions Precautions: Fall Precaution Comments: Bed to chair only. NO AMBULATION.  Required Braces or Orthoses: Spinal Brace Spinal Brace: Lumbar corset Restrictions Weight Bearing Restrictions: No      Mobility  Bed Mobility Overal bed mobility: Needs Assistance;+2 for physical assistance Bed Mobility: Rolling;Sidelying to Sit Rolling: Max assist Sidelying to sit: Max assist;+2 for physical assistance       General bed mobility comments: Assist for all aspects of bed mobility. Bed pad used to assist with scooting and rolling.   Transfers Overall transfer level: Needs assistance Equipment used: 2 person hand held assist Transfers: Sit to/from [facility] Sit to Stand: Total assist;+2 physical assistance Stand pivot transfers:  Total assist;+2 physical assistance       General transfer comment: +2 assist to power-up to full standing and take pivotal steps around to the chair. Bed pad used for extra support under hips.   Ambulation/Gait             General Gait Details: Unable at this time.    Stairs            Wheelchair Mobility    Modified Rankin (Stroke Patients Only)       Balance Overall balance assessment: Needs assistance Sitting-balance support: Feet supported;No upper extremity supported Sitting balance-Leahy Scale: Poor   Postural control: Posterior lean;Right lateral lean Standing balance support: Bilateral upper extremity supported;During functional activity Standing balance-Leahy Scale: Zero                               Pertinent Vitals/Pain Pain Assessment: 0-10 Pain Score: 8  Pain Location: Low back Pain Descriptors / Indicators: Grimacing;Guarding Pain Intervention(s): Limited activity within patient's tolerance;Monitored during session;RN gave pain meds during session;Repositioned    Home Living Family/patient expects to be discharged to:: Skilled nursing facility                 Additional Comments: Pt currently residing in an ALF.     Prior Function Level of Independence: Needs assistance   Gait / Transfers Assistance Needed: RW or wheelchair for mobility  ADL's / Homemaking Assistance Needed: Assist from staff for bathing and dressing.         Hand Dominance  Extremity/Trunk Assessment   Upper Extremity Assessment: Defer to OT evaluation           Lower Extremity Assessment: Generalized weakness      Cervical / Trunk Assessment: Kyphotic;Other exceptions  Communication      Cognition Arousal/Alertness: Awake/alert Behavior During Therapy: Flat affect Overall Cognitive Status: No family/caregiver present to determine baseline cognitive functioning                      General Comments      Exercises         Assessment/Plan    PT Assessment Patient needs continued PT services  PT Diagnosis Difficulty walking;Generalized weakness;Acute pain   PT Problem List Decreased strength;Decreased range of motion;Decreased activity tolerance;Decreased balance;Decreased mobility;Decreased knowledge of use of DME;Decreased safety awareness;Decreased knowledge of precautions;Pain  PT Treatment Interventions DME instruction;Gait training;Stair training;Functional mobility training;Therapeutic activities;Therapeutic exercise;Neuromuscular re-education;Patient/family education   PT Goals (Current goals can be found in the Care Plan section) Acute Rehab PT Goals Patient Stated Goal: Decrease pain PT Goal Formulation: With patient Time For Goal Achievement: 12/19/14 Potential to Achieve Goals: Fair    Frequency Min 3X/week   Barriers to discharge        Co-evaluation               End of Session Equipment Utilized During Treatment: Back brace Activity Tolerance: Patient limited by pain;Patient limited by fatigue Patient left: in chair;with call bell/phone within reach Nurse Communication: Mobility status         Time: 6962-9528 PT Time Calculation (min) (ACUTE ONLY): 21 min   Charges:   PT Evaluation $Initial PT Evaluation Tier I: 1 Procedure     PT G Codes:        Rolinda Roan 10-Dec-2014, 2:44 PM  Rolinda Roan, PT, DPT Acute Rehabilitation Services Pager: [phone]

## 2014-12-05 NOTE — Progress Notes (Signed)
Orthopedic Tech Progress Note Patient Details:  [name] [name] [birth year] [id number]  Patient ID: [name], female   DOB: [birth year], 81 y.o.   MRN: [id number] Called in bio-tech brace order; spoke with Ramonita Lab, [place] 12/05/2014, 9:46 AM

## 2014-12-05 NOTE — Progress Notes (Signed)
Subjective:   back pain / tolerated HD yest / some constipation . For  Mri today noted  Objective Vital signs in last 24 hours: Filed Vitals:   12/04/14 1019 12/04/14 1053 12/04/14 2144 12/05/14 0513  BP: 130/50 131/52 126/48 123/29  Pulse: 80 80 65 62  Temp: 98 F (36.7 C) 98 F (36.7 C) 98.3 F (36.8 C) 97.5 F (36.4 C)  TempSrc: Oral Oral Oral Oral  Resp: 20 19 18 16   Height:      Weight: 62.6 kg (138 lb 0.1 oz)  63.2 kg (139 lb 5.3 oz)   SpO2: 100% 100% 100% 100%   Weight change: -1 kg (-2 lb 3.3 oz)  Physical Exam: General appearance:  NAD  But uncomfortable co back pain Resp:CTA  Cardio: Irregular rhythm, no murmur or rub GI: + BS, soft and nontender Extremities: No pedal edema  Access: AVF @ RUA with + bruit  HD: MWF [facility] 3h 32min 65 kg F160 400/A1.5 2/2.0 [place] Heparin 2000 RUA AVF Venofer 50/wk, no other meds  Problem/Plan 1. Acute L5 compression fracture - sec to fall 3/12./ Ortho rx 2. Pulmonary edema / hypoxemia - moderate CHF per CXR 3/12,  lower edw hd in am  3. ESRD - HD on MWF @ [facility]. Next HD tomorrow. 4. HTN/Volume - BP 123/29on Metoprolol 6.25 bid ; wt 63.2 kg below edw/  Needs lower EDW at dc 5. Anemia - Hgb 10.8 Venofer qwk. No esa fu hgbs  6. Sec HPT - Ca 9.3 (10.5 corrected), P 4.4 Fosrenol 1 g with meals./ no vit d  7. Nutrition - Alb 2.4./ renal carb diet/ renal vit/ add breeze supplement 8. Hx L2 compression fx - s/p kyphoplasty 09/2013. 9. DM 2 - insulin per primary 10. Constipation = colace / miralax / prn sorbitol   [name] Haber, PA-C [place] [phone] 12/05/2014,8:47 AM  LOS: 3 days   I have seen and examined this patient and agree with the plan of care. If ortho does not think she is a candidate for surgery then we will need to discuss palliative care. Currently she is in a lot of discomfort and dialysis is not improving her quality of life.  [name] Melena, MD 12/05/2014, 3:26  PM  Labs: Basic Metabolic Panel:  Recent Labs Lab 12/02/14 1159 12/03/14 0704 12/04/14 0714  NA 138 139 138  K 4.3 4.1 5.1  CL 101 100 99  CO2 26 25 25   GLUCOSE 102* 175* 206*  BUN 31* 15 33*  CREATININE 4.48* 2.79* 4.23*  CALCIUM 9.3 8.9 9.3  PHOS  --  4.0 4.4   Liver Function Tests:  Recent Labs Lab 12/03/14 0704 12/04/14 0714  ALBUMIN 2.6* 2.4*  CBC:  Recent Labs Lab 12/02/14 1159 12/03/14 0030 12/04/14 0716  WBC 11.9* 13.2* 8.9  NEUTROABS 9.9*  --   --   HGB 11.3* 13.1 10.8*  HCT 34.4* 39.3 33.1*  MCV 93.7 93.8 94.6  PLT 248 307 274   Cardiac Enzymes:  Recent Labs Lab 12/02/14 1159 12/02/14 2000 12/03/14 0030 12/03/14 0704  TROPONINI 0.56* 0.69* 0.83* 0.56*   CBG:  Recent Labs Lab 12/03/14 2045 12/04/14 1210 12/04/14 1718 12/04/14 2140 12/05/14 0752  GLUCAP 247* 264* 283* 347* 212*     Medications:   . amitriptyline  10 mg Oral QHS  . aspirin  81 mg Oral Daily  . atorvastatin  20 mg Oral q1800  . calcitonin (salmon)  1 spray Alternating Nares Daily  .  febuxostat  40 mg Oral QHS  . heparin  5,000 Units Subcutaneous 3 times per day  . insulin aspart  0-9 Units Subcutaneous TID WC  . insulin glargine  5 Units Subcutaneous QHS  . lanthanum  1,000 mg Oral TID WC  . loratadine  10 mg Oral Daily  . metoprolol tartrate  6.25 mg Oral BID  . pantoprazole sodium  40 mg Oral QHS  . polyethylene glycol  17 g Oral Daily  . sodium chloride  3 mL Intravenous Q12H

## 2014-12-06 ENCOUNTER — Inpatient Hospital Stay (HOSPITAL_COMMUNITY): Payer: Medicare Other

## 2014-12-06 LAB — RENAL FUNCTION PANEL
Albumin: 2.6 g/dL — ABNORMAL LOW (ref 3.5–5.2)
Anion gap: 12 (ref 5–15)
BUN: 49 mg/dL — ABNORMAL HIGH (ref 6–23)
CALCIUM: 9.7 mg/dL (ref 8.4–10.5)
CO2: 31 mmol/L (ref 19–32)
Chloride: 93 mmol/L — ABNORMAL LOW (ref 96–112)
Creatinine, Ser: 4.98 mg/dL — ABNORMAL HIGH (ref 0.50–1.10)
GFR calc Af Amer: 8 mL/min — ABNORMAL LOW (ref >90)
GFR, EST NON AFRICAN AMERICAN: 7 mL/min — AB (ref >90)
Glucose, Bld: 219 mg/dL — ABNORMAL HIGH (ref 70–99)
Phosphorus: 5.2 mg/dL — ABNORMAL HIGH (ref 2.3–4.6)
Potassium: 5.3 mmol/L — ABNORMAL HIGH (ref 3.5–5.1)
SODIUM: 136 mmol/L (ref 135–145)

## 2014-12-06 LAB — CBC
HCT: 34.8 % — ABNORMAL LOW (ref 36.0–46.0)
Hemoglobin: 10.9 g/dL — ABNORMAL LOW (ref 12.0–15.0)
MCH: 30.4 pg (ref 26.0–34.0)
MCHC: 31.3 g/dL (ref 30.0–36.0)
MCV: 96.9 fL (ref 78.0–100.0)
PLATELETS: 303 10*3/uL (ref 150–400)
RBC: 3.59 MIL/uL — AB (ref 3.87–5.11)
RDW: 14.6 % (ref 11.5–15.5)
WBC: 8.7 10*3/uL (ref 4.0–10.5)

## 2014-12-06 LAB — GLUCOSE, CAPILLARY
GLUCOSE-CAPILLARY: 189 mg/dL — AB (ref 70–99)
GLUCOSE-CAPILLARY: 291 mg/dL — AB (ref 70–99)
Glucose-Capillary: 218 mg/dL — ABNORMAL HIGH (ref 70–99)

## 2014-12-06 LAB — APTT: aPTT: 26 seconds (ref 24–37)

## 2014-12-06 LAB — PROTIME-INR
INR: 0.97 (ref 0.00–1.49)
PROTHROMBIN TIME: 13 s (ref 11.6–15.2)

## 2014-12-06 MED ORDER — HEPARIN SODIUM (PORCINE) 5000 UNIT/ML IJ SOLN
5000.0000 [IU] | Freq: Three times a day (TID) | INTRAMUSCULAR | Status: DC
  Administered 2014-12-07 – 2014-12-15 (×21): 5000 [IU] via SUBCUTANEOUS
  Filled 2014-12-06 (×29): qty 1

## 2014-12-06 MED ORDER — HEPARIN SODIUM (PORCINE) 5000 UNIT/ML IJ SOLN: 5000.0000 [IU] | Freq: Three times a day (TID) | INTRAMUSCULAR | Status: DC

## 2014-12-06 MED ORDER — INSULIN ASPART 100 UNIT/ML ~~LOC~~ SOLN
0.0000 [IU] | Freq: Three times a day (TID) | SUBCUTANEOUS | Status: DC
Start: 2014-12-06 — End: 2014-12-15
  Administered 2014-12-06 – 2014-12-07 (×3): 8 [IU] via SUBCUTANEOUS
  Administered 2014-12-07: 3 [IU] via SUBCUTANEOUS
  Administered 2014-12-08: 2 [IU] via SUBCUTANEOUS
  Administered 2014-12-08: 8 [IU] via SUBCUTANEOUS
  Administered 2014-12-09: 5 [IU] via SUBCUTANEOUS
  Administered 2014-12-09 – 2014-12-10 (×4): 3 [IU] via SUBCUTANEOUS
  Administered 2014-12-12: 2 [IU] via SUBCUTANEOUS
  Administered 2014-12-13: 3 [IU] via SUBCUTANEOUS
  Administered 2014-12-13: 1 [IU] via SUBCUTANEOUS
  Administered 2014-12-14: 10 [IU] via SUBCUTANEOUS
  Administered 2014-12-14: 11 [IU] via SUBCUTANEOUS
  Administered 2014-12-15: 3 [IU] via SUBCUTANEOUS
  Administered 2014-12-15: 15 [IU] via SUBCUTANEOUS

## 2014-12-06 MED ORDER — TRAMADOL HCL 50 MG PO TABS
ORAL_TABLET | ORAL | Status: AC
  Filled 2014-12-06: qty 1

## 2014-12-06 MED ORDER — DOCUSATE SODIUM 50 MG/5ML PO LIQD
100.0000 mg | Freq: Every day | ORAL | Status: DC
  Administered 2014-12-06: 100 mg via ORAL
  Filled 2014-12-06 (×2): qty 10

## 2014-12-06 NOTE — Progress Notes (Addendum)
Patient ID: [name] [name], female   DOB: 1925-12-31, 81 y.o.   MRN: [id number]    Pt tentatively scheduled for L5 VP/KP today Unfortunately, Ins pre certification still pending And KUB shows good amount of residual barium in bowel  Will need bowel evacuated prior to procedure Still awaiting Ins pre certification Scheduler claims Insurance can take up to 3-5 days  Pre certification is in process  Pt aware  I discussed with pts MD; will order SS enema tonight and recheck KUB in am

## 2014-12-06 NOTE — Evaluation (Signed)
Occupational Therapy Evaluation Patient Details Name: [name] [name] MRN: [id number] DOB: 1926/01/28 Today's Date: 12/06/2014    History of Present Illness 81 yo F with PMH noted for L2 fx and kyphoplasty, ESRD on HD, CAD, DM2, HTN who presents with low back pain s/p fall yesterday. Found to have L5 compression fracture on initial films. Lumbar corset provided. MRI on 12/05/14 to evaluate whether pt is a candidate for vertebroplasty.    Clinical Impression   Pt admitted with above. She demonstrates the below listed deficits and will benefit from continued OT to maximize safety and independence with BADLs.  OT eval limited this pm due to increased pain and lethargy.  Currently, she requires mod - total A for BADLs.  She will need SNF level rehab at discharge.  Will follow.       Follow Up Recommendations  SNF;Supervision/Assistance - 24 hour    Equipment Recommendations  None recommended by OT    Recommendations for Other Services       Precautions / Restrictions Precautions Precautions: Fall Precaution Comments: Bed to chair only. NO AMBULATION.  Required Braces or Orthoses: Spinal Brace Spinal Brace: Lumbar corset Restrictions Weight Bearing Restrictions: No      Mobility Bed Mobility Overal bed mobility: Needs Assistance;+2 for physical assistance Bed Mobility: Rolling Rolling: Max assist         General bed mobility comments: Pt only able to tolerate rolling due to increased pain.  She resists further movement and fell asleep often when attempting to mobilize her   Transfers                 General transfer comment: unable this date    Balance                                            ADL Overall ADL's : Needs assistance/impaired Eating/Feeding: Moderate assistance Eating/Feeding Details (indicate cue type and reason): due to positioning  Grooming: Wash/dry hands;Wash/dry face;Brushing hair;Minimal assistance;Bed level   Upper  Body Bathing: Bed level;Minimal assitance   Lower Body Bathing: Total assistance;Bed level   Upper Body Dressing : Total assistance;Bed level   Lower Body Dressing: Total assistance;Bed level   Toilet Transfer: Total assistance Toilet Transfer Details (indicate cue type and reason): unable due to pain  Toileting- Clothing Manipulation and Hygiene: Total assistance;Bed level       Functional mobility during ADLs: Maximal assistance (rolling in bed only ) General ADL Comments: Pt limitecd by lethargy and pain      Vision     Perception     Praxis      Pertinent Vitals/Pain Pain Assessment: Faces Faces Pain Scale: Hurts whole lot Pain Location: low back  Pain Descriptors / Indicators: Guarding;Grimacing Pain Intervention(s): Limited activity within patient's tolerance     Hand Dominance Left   Extremity/Trunk Assessment Upper Extremity Assessment Upper Extremity Assessment: Generalized weakness   Lower Extremity Assessment Lower Extremity Assessment: Defer to PT evaluation   Cervical / Trunk Assessment Cervical / Trunk Assessment: Kyphotic;Other exceptions Cervical / Trunk Exceptions: Hx of L1 fracture and vertebroplasty   Communication Communication Communication: No difficulties   Cognition Arousal/Alertness: Lethargic Behavior During Therapy: Flat affect Overall Cognitive Status: No family/caregiver present to determine baseline cognitive functioning                     General Comments  Exercises Exercises: Other exercises Other Exercises Other Exercises: Pt performed 10 reps shoulder flexion requiring mod cues to perform in supine as pt fell asleep x 2   Shoulder Instructions      Home Living Family/patient expects to be discharged to:: Skilled nursing facility                                 Additional Comments: Pt currently residing in an ALF.       Prior Functioning/Environment Level of Independence: Needs  assistance  Gait / Transfers Assistance Needed: RW or wheelchair for mobility ADL's / Homemaking Assistance Needed: Assist from staff for bathing and dressing.    Comments: Pt unable to provide accurate info - provided contradictory statements to questions askes     OT Diagnosis: Generalized weakness;Cognitive deficits;Acute pain   OT Problem List: Decreased strength;Decreased activity tolerance;Impaired balance (sitting and/or standing);Decreased cognition;Decreased safety awareness;Decreased knowledge of use of DME or AE;Decreased knowledge of precautions;Pain   OT Treatment/Interventions: Self-care/ADL training;Therapeutic exercise;DME and/or AE instruction;Therapeutic activities;Cognitive remediation/compensation;Patient/family education;Balance training    OT Goals(Current goals can be found in the care plan section) Acute Rehab OT Goals OT Goal Formulation: Patient unable to participate in goal setting Time For Goal Achievement: 12/20/14 Potential to Achieve Goals: Fair ADL Goals Pt Will Perform Upper Body Bathing: with supervision;sitting Pt Will Perform Lower Body Bathing: with mod assist;with adaptive equipment;sit to/from stand Pt Will Perform Upper Body Dressing: with adaptive equipment;sitting;with min assist Pt Will Transfer to Toilet: with mod assist;ambulating;regular height toilet;bedside commode;stand pivot transfer Additional ADL Goal #1: Pt will require min verbal cues for back precautions   OT Frequency: Min 2X/week   Barriers to D/C: Decreased caregiver support          Co-evaluation              End of Session Equipment Utilized During Treatment: Oxygen  Activity Tolerance: Patient limited by pain;Patient limited by lethargy Patient left: in bed;with call bell/phone within reach;with bed alarm set   Time: 5320-2334 OT Time Calculation (min): 11 min Charges:  OT General Charges $OT Visit: 1 Procedure OT Evaluation $Initial OT Evaluation Tier I: 1  Procedure G-Codes:    Jaleiah Asay, Ellard Artis M 12-28-14, 3:05 PM

## 2014-12-06 NOTE — Progress Notes (Signed)
Subjective:     Patient reports pain as 5 on 0-10 scale.No change in her status. She returned from Dialysis. Difficult to determine a good Motor exam on her in her lowers. Back is less painful on exam.Vertebroplast scheduled for tomorrow. She was up with her brace yesterday,in a chair only. She is too weak to ambulate.    Objective: Vital signs in last 24 hours: Temp:  [97 F (36.1 C)-98.6 F (37 C)] 97 F (36.1 C) (03/16 1212) Pulse Rate:  [58-80] 80 (03/16 1212) Resp:  [16-18] 16 (03/16 1212) BP: (87-157)/(37-97) 128/53 mmHg (03/16 1212) SpO2:  [95 %-100 %] 100 % (03/16 1212) Weight:  [61.6 kg (135 lb 12.9 oz)-63.549 kg (140 lb 1.6 oz)] 61.6 kg (135 lb 12.9 oz) (03/16 1212)  Intake/Output from previous day: 03/15 0701 - 03/16 0700 In: 0  Out: 100 [Urine:100] Intake/Output this shift: Total I/O In: 0  Out: 1820 [Other:1820]   Recent Labs  12/04/14 0716 12/06/14 1150  HGB 10.8* 10.9*    Recent Labs  12/04/14 0716 12/06/14 1150  WBC 8.9 8.7  RBC 3.50* 3.59*  HCT 33.1* 34.8*  PLT 274 303    Recent Labs  12/04/14 0714 12/06/14 1150  NA 138 136  K 5.1 5.3*  CL 99 93*  CO2 25 31  BUN 33* 49*  CREATININE 4.23* 4.98*  GLUCOSE 206* 219*  CALCIUM 9.3 9.7    Recent Labs  12/06/14 1219  INR 0.97    Sensation intact distally Compartment soft  Assessment/Plan:     Up with therapy  Huriel Matt A 12/06/2014, 4:01 PM

## 2014-12-06 NOTE — Progress Notes (Signed)
Patient was seen on dialysis and the procedure was supervised.   BFR 400 Via RUA AVF  BP is 105/45.  AP -200  VP 190 UF 3 liters Bath 2/2.5  Patient appears to be tolerating treatment well. Possible L5 kyphoplasty pending approval; she's still NPO for now.  Otelia Santee, MD 12/06/2014, 9:54 AM

## 2014-12-06 NOTE — Consult Note (Signed)
Chief Complaint: Chief Complaint  Patient presents with  . Fall  Low back pain- severe  Referring Physician(s): Dr Mingo Amber Teaching Service  History of Present Illness: NILZA EAKER is a 81 y.o. female  Pt admitted with severe low back pain Pt states her pain has developed since fall on Fr 3/11 MRI reveals acute Lumbar 5 fracture She has had previous L2 KP 09/2013---good relief Request now for L5 vertebroplasty/kyphoplasty Dr Barbie Banner has reviewed imaging and approves procedure I have seen and examined pt Pt is tender in this area  Past Medical History  Diagnosis Date  . Type 2 diabetes mellitus     mellitus? x25 years  . Essential hypertension   . Hypercholesterolemia   . ESRD (end stage renal disease)   . Anemia of chronic disease   . CAD (coronary artery disease)     Nonobstructive   . GERD (gastroesophageal reflux disease)   . HOCM (hypertrophic obstructive cardiomyopathy)     Past Surgical History  Procedure Laterality Date  . Total knee arthroplasty      L-2006; R-2008   . Appendectomy    . Transthoracic echocardiogram  05/2011  . Av fistula placement  07/07/2012    Procedure: ARTERIOVENOUS (AV) FISTULA CREATION;  Surgeon: Mal Misty, MD;  Location: [facility] OR;  Service: Vascular;  Laterality: Right;  Brachial - cephalic arterivenous fistula  . Esophagogastroduodenoscopy (egd) with esophageal dilation N/A 10/12/2013    Procedure: ESOPHAGOGASTRODUODENOSCOPY (EGD) WITH ESOPHAGEAL DILATION;  Surgeon: Lafayette Dragon, MD;  Location: [facility] ENDOSCOPY;  Service: Endoscopy;  Laterality: N/A;  . Savory dilation N/A 10/12/2013    Procedure: SAVORY DILATION;  Surgeon: Lafayette Dragon, MD;  Location: [facility] ENDOSCOPY;  Service: Endoscopy;  Laterality: N/A;  . Back surgery    . Fracture surgery      Allergies: Review of patient's allergies indicates no known allergies.  Medications: Prior to Admission medications   Medication Sig Start Date End Date Taking? Authorizing Provider   acetaminophen (TYLENOL) 500 MG tablet Take 1,000 mg by mouth every 6 (six) hours as needed (pain).   Yes Historical Provider, MD  amitriptyline (ELAVIL) 10 MG tablet Take 10 mg by mouth at bedtime.    Yes Historical Provider, MD  aspirin EC 81 MG tablet Take 81 mg by mouth daily.   Yes Historical Provider, MD  atorvastatin (LIPITOR) 20 MG tablet Take 1 tablet (20 mg total) by mouth daily. 11/11/13  Yes Velvet Bathe, MD  b complex-vitamin c-folic acid (NEPHRO-VITE) 0.8 MG TABS tablet Take 1 tablet by mouth at bedtime.   Yes Historical Provider, MD  cyanocobalamin (,VITAMIN B-12,) 1000 MCG/ML injection Inject 1,000 mcg into the muscle every 30 (thirty) days.   Yes Historical Provider, MD  febuxostat (ULORIC) 40 MG tablet Take 40 mg by mouth at bedtime.   Yes Historical Provider, MD  fexofenadine (ALLEGRA) 180 MG tablet Take 180 mg by mouth daily as needed for allergies or rhinitis.   Yes Historical Provider, MD  fish oil-omega-3 fatty acids 1000 MG capsule Take 1 g by mouth daily.    Yes Historical Provider, MD  Glucosamine-Chondroitin 250-200 MG CAPS Take 1 capsule by mouth daily.     Yes Historical Provider, MD  insulin detemir (LEVEMIR) 100 UNIT/ML injection Inject 10 Units into the skin daily.   Yes Historical Provider, MD  insulin regular (NOVOLIN R,HUMULIN R) 100 units/mL injection Inject 0-12 Units into the skin 3 (three) times daily before meals. Per sliding scale: 0-150=0 units, 151-200 =  2 units, 201-250 = 4 units, 251-300 = 6 units, 301-350 = 8 units, 351-400 = 10 units, 401-450 = 12 units, > 450 CALL MD.   Yes Historical Provider, MD  lansoprazole (PREVACID) 30 MG capsule Take 30 mg by mouth daily at 12 noon.   Yes Historical Provider, MD  lanthanum (FOSRENOL) 500 MG chewable tablet Chew 1,000 mg by mouth 3 (three) times daily with meals.    Yes Historical Provider, MD  metoprolol succinate (TOPROL XL) 25 MG 24 hr tablet Take 0.5 tablets (12.5 mg total) by mouth daily. 11/29/13  Yes Scott T  Kathlen Mody, PA-C  nystatin (MYCOSTATIN/NYSTOP) 100000 UNIT/GM POWD Apply 1 g topically as needed (affected area).   Yes Historical Provider, MD  OxyCODONE HCl, Abuse Deter, 5 MG TABA Take 5 mg by mouth every 4 (four) hours as needed (pain).    Yes Historical Provider, MD  potassium chloride SA (K-DUR,KLOR-CON) 20 MEQ tablet Take 20 mEq by mouth daily.   Yes Historical Provider, MD  calcitRIOL (ROCALTROL) 0.25 MCG capsule Take 1 capsule (0.25 mcg total) by mouth daily. Patient not taking: Reported on 12/02/2014 10/14/13   Nishant Dhungel, MD  diazepam (VALIUM) 5 MG tablet Take 1 tablet (5 mg total) by mouth every 12 (twelve) hours as needed for anxiety. 11/11/13   Velvet Bathe, MD  feeding supplement, GLUCERNA SHAKE, (GLUCERNA SHAKE) LIQD Take 237 mLs by mouth 2 (two) times daily between meals. Patient not taking: Reported on 12/02/2014 11/11/13   Velvet Bathe, MD  phenol (CHLORASEPTIC) 1.4 % LIQD Use as directed 1 spray in the mouth or throat 3 (three) times daily as needed for throat irritation / pain.    Historical Provider, MD  polyethylene glycol (MIRALAX / GLYCOLAX) packet Take 17 g by mouth at bedtime.    Historical Provider, MD  sodium bicarbonate 650 MG tablet Take 1 tablet (650 mg total) by mouth daily. Patient not taking: Reported on 12/02/2014 10/14/13   Nishant Dhungel, MD  traMADol (ULTRAM) 50 MG tablet Take 1 tablet (50 mg total) by mouth every 6 (six) hours as needed (pain). 11/11/13   Velvet Bathe, MD     Family History  Problem Relation Age of Onset  . Heart attack Father   . Stroke Mother     History   Social History  . Marital Status: Widowed    Spouse Name: N/A  . Number of Children: N/A  . Years of Education: N/A   Social History Main Topics  . Smoking status: Never Smoker   . Smokeless tobacco: Never Used     Comment: no smoking   . Alcohol Use: No  . Drug Use: No  . Sexual Activity: Not Currently    Birth Control/ Protection: Post-menopausal   Other Topics Concern   . None   Social History Narrative   Widowed. Retired and lives at assisted living       Review of Systems: A 12 point ROS discussed and pertinent positives are indicated in the HPI above.  All other systems are negative.  Review of Systems  Constitutional: Positive for activity change and appetite change.  Cardiovascular: Negative for chest pain.  Gastrointestinal: Negative for abdominal pain.  Musculoskeletal: Positive for back pain.  Neurological: Positive for weakness.  Psychiatric/Behavioral: Negative for behavioral problems and confusion.    Vital Signs: BP 120/50 mmHg  Pulse 69  Temp(Src) 97.8 F (36.6 C) (Oral)  Resp 17  Ht 5\' 1"  (1.549 m)  Wt 63 kg (138 lb 14.2 oz)  BMI 26.26 kg/m2  SpO2 100%  LMP  (LMP Unknown)  Physical Exam  Constitutional: She is oriented to person, place, and time.  Cardiovascular: Normal rate and regular rhythm.   No murmur heard. Pulmonary/Chest: Effort normal and breath sounds normal. She has no wheezes.  Abdominal: Soft. Bowel sounds are normal. There is no tenderness.  Musculoskeletal: Normal range of motion.  Low back tender to palpate Not radiation of pain  Neurological: She is alert and oriented to person, place, and time.  Skin: Skin is warm and dry.  Psychiatric: She has a normal mood and affect. Her behavior is normal. Judgment and thought content normal.  Nursing note and vitals reviewed.   Mallampati Score:  MD Evaluation Airway: WNL Heart: WNL Abdomen: WNL Chest/ Lungs: WNL ASA  Classification: 3 Mallampati/Airway Score: Two  Imaging: Dg Chest 1 View  12/02/2014   CLINICAL DATA:  Fall. Patient on dialysis and did not receive regular dialysis is yesterday due to fever.  EXAM: CHEST  1 VIEW  COMPARISON:  11/14/2013  FINDINGS: Marked cardiac enlargement noted. There is a left plural effusion present. Mild to moderate interstitial edema noted. There is multifocal airspace opacities within the left midlung and left  base.  IMPRESSION: 1. Moderate CHF. 2. Multifocal airspace opacities in left lung may represent edema or infection.   Electronically Signed   By: Kerby Moors M.D.   On: 12/02/2014 13:10   Dg Lumbar Spine 2-3 Views  12/03/2014   CLINICAL DATA:  Low back pain since falling 2 days ago, history diabetes, hypertension, end-stage renal disease, coronary artery disease, prior back surgery with unspecified tight  EXAM: LUMBAR SPINE - 2-3 VIEW  COMPARISON:  MRI lumbar spine 01/17/2014, lumbar spine radiographs of 01/01/2014  FINDINGS: Five non-rib-bearing lumbar vertebrae.  Diffuse osseous demineralization.  Old superior endplate compression fracture of L5 vertebral body is identified identified; while this is new since prior radiographs of 01/01/2014, this was identified on the preceding MR though has probably slightly increased in degree of superior endplate compression since the prior MR.  Old superior endplate compression fracture of L2 vertebral body with again identified spinal augmentation procedure.  Remaining vertebra normal in height and alignment.  No definite acute fracture, subluxation or bone destruction.  Assessment of the lower lumbar spine on AP view is limited by superimposed retained contrast material within the colon.  LEFT SI joint preserved, RIGHT obscured.  Atherosclerotic calcifications aorta.  IMPRESSION: Old compression fracture L2 post spinal augmentation procedure.  Old compression fracture L5 suspect slightly increased since 2015 MRI.  Diffuse osseous demineralization.   Electronically Signed   By: Lavonia Dana M.D.   On: 12/03/2014 14:46   Dg Sacrum/coccyx  12/02/2014   CLINICAL DATA:  fall at her nursing facility ([facility]), pt reports pain in her sacrum. Pt is a dialysis pt and did not receive her regular dialysis yesterday due to fever. AP view is obscured. Pt denies any recent contrast studies. Pt unwilling to lay flat due to pain level. Best obtainable images  EXAM:  SACRUM AND COCCYX - 2+ VIEW  COMPARISON:  01/01/2014  FINDINGS: New compression fracture deformity of L5 with approximately 50% loss of height. Previous kyphoplasty/ vertebroplasty of L2 stable. Diffuse osteopenia. Old fracture deformities of the right superior and inferior pubic rami. Bilateral pelvic phleboliths. Extensive radiodense material in the colon as if before.  IMPRESSION: 1. L5 compression fracture deformity, new since prior study. If patient is under consideration for kyphoplasty/vertebroplasty, recommend CT or MR  lumbar spine to exclude burst fracture or posterior element involvement.   Electronically Signed   By: Lucrezia Europe M.D.   On: 12/02/2014 13:13   Mr Lumbar Spine Wo Contrast  12/05/2014   CLINICAL DATA:  Low back pain after falling yesterday. History of compression fractures of the spine.  EXAM: MRI LUMBAR SPINE WITHOUT CONTRAST  TECHNIQUE: Multiplanar, multisequence MR imaging of the lumbar spine was performed. No intravenous contrast was administered.  COMPARISON:  Radiographs dated 12/03/2014 and 01/01/2014 and MRIs dated 01/17/2014 and 09/24/2013  FINDINGS: Normal conus tip at L1-2. No significant abnormality of the paraspinal soft tissues.  T10-11 and T11-12:  Normal.  T12-L1: There is a new subtle probably acute or subacute benign appearing compression fracture of the superior endplate of L1. No protrusion of bone or disc material into spinal canal. The fracture appears to involve the base of both pedicles.  L1-2: Old compression fracture of L2 treated with vertebroplasty. Protrusion of the posterior superior aspect of the L2 vertebral body into the spinal canal with moderate compression of the thecal sac, more prominent than on the prior exam. No focal neural impingement.  L2-3:  No significant abnormality.  L3-4: No significant abnormality. No neural impingement. Posterior decompression.  L4-5: Old severe compression fracture of the L5 vertebra, progressed since the prior study. There  could be a tiny acute or subacute component of the fracture of the anterior inferior aspect of the L5 vertebral body.  Posterior decompression.  No neural impingement.  L5-S1: Chronic compression deformity of L5, increased since the prior study. There is a small focal area of edema in the anterior inferior aspect of the L5 vertebra. No neural impingement. The facet joints appear fused at L5-S1.  IMPRESSION: 1. Interval at new subtle compression fracture of the superior aspect of L1. 2. Old compression fractures of L2 and L5. 3. Possible acute or subacute component of the L5 fracture anteriorly and inferiorly but this is not definitive. 4. Slight increased encroachment on the spinal canal by the posterior superior aspect of L2 but there is no focal neural impingement.   Electronically Signed   By: Lorriane Shire M.D.   On: 12/05/2014 10:34    Labs:  CBC:  Recent Labs  12/06/13 1334 12/02/14 1159 12/03/14 0030 12/04/14 0716  WBC 7.9 11.9* 13.2* 8.9  HGB 9.6* 11.3* 13.1 10.8*  HCT 31.7* 34.4* 39.3 33.1*  PLT 282 248 307 274    COAGS: No results for input(s): INR, APTT in the last 8760 hours.  BMP:  Recent Labs  12/06/13 1334 12/02/14 1159 12/03/14 0704 12/04/14 0714  NA 136 138 139 138  K 3.5 4.3 4.1 5.1  CL 93* 101 100 99  CO2 35* 26 25 25   GLUCOSE 312* 102* 175* 206*  BUN 10 31* 15 33*  CALCIUM 9.8 9.3 8.9 9.3  CREATININE 2.7* 4.48* 2.79* 4.23*  GFRNONAA  --  8* 14* 9*  GFRAA  --  9* 17* 10*    LIVER FUNCTION TESTS:  Recent Labs  12/06/13 1334 12/03/14 0704 12/04/14 0714  BILITOT 0.60  --   --   AST 15  --   --   ALT 12  --   --   ALKPHOS 133*  --   --   PROT 6.1*  --   --   ALBUMIN  --  2.6* 2.4*    TUMOR MARKERS: No results for input(s): AFPTM, CEA, CA199, CHROMGRNA in the last 8760 hours.  Assessment and Plan:  Severe back pain Acute L5 fracture post fall at home MRI confirmation Scheduled now for L5 KP/VP per Dr Barbie Banner approval Ins approval  pending She is aware of Insurance requirement of precertification Pt is in dialysis now  Risks and Benefits discussed with the patient including, but not limited to education regarding the natural healing process of compression fractures without intervention, bleeding, infection, cement migration which may cause spinal cord damage, paralysis, pulmonary embolism or even death. All of the patient's questions were answered, patient is agreeable to proceed. Consent signed and in chart.  Thank you for this interesting consult.  I greatly enjoyed meeting LANYLA COSTELLO and look forward to participating in their care.  Signed: TURPIN,PAMELA A 12/06/2014, 10:05 AM   I spent a total of 40 Minutes  in face to face in clinical consultation, greater than 50% of which was counseling/coordinating care for L5 KP/VP

## 2014-12-06 NOTE — Progress Notes (Signed)
Family Medicine Teaching Service Daily Progress Note Intern Pager: [phone]  Patient name: [name] [name] Medical record number: [id number] Date of birth: November 19, 1925 Age: 81 y.o. Gender: female  Primary Care Provider: Ann Held, MD Consultants: ortho, cards Code Status: DNR  Pt Overview and Major Events to Date:  3/12: admitted for fall, found to have an L5 compression fx  3/13: lumbar films ordered  3/15: MRI lumbar spine to eval for vertebroplasty; per ortho 3/16: Patient in dialysis in AM, NPO for vertebroplasty  Assessment and Plan: [name] [name] is a 81 y.o. female presenting with fall. PMH is significant for Hx L2 fracture w/ previous kypophlasty, ESRD on HD since 2015, mild non-obstructive CAD on cath in 2008 with evidence of HOCM, DM2, HTN, osteoporosis, GERD and dCHF.   # Fall/L5 compression fx: still in pain. Will have IR vertebroplasty 3/16   - fentanyl 12.5 mcg IV q4 PRN for severe pain - tramadol for moderate pain - tylenol for mild pain - Ortho consulted, Lumbar MRI obtained and now NPO for IR vertebroplasty 3/16 - Continue Calcitonin (nasal spray) - c/s SW for possible SNF placement - PT recommended SNF / 24 hour supervision and assistance  # Elevated Troponin/Demand ischemia: hx of having troponins elevated. No reports of SOB or chest pain.  - troponins trending down   # Anemia of Chronic disease/MGUS: Has been seen by Dr. Marin Olp. Baseline hemoglobin 11.8 hematocrit 36.6. Blood studies showed a monoclonal spike of 0.18 g/dL. On a serum immunofixation, a IgG Kappa protein was noted. However, she had normal values of her immunoglobulins. Unsure of how aggressive treatment is.   # Acute on chronic dCHF: Received HD last night when she started need some O2  - ECHO   # HOCM,HTN, HLD, CAD: minimal by Cardiac cath 2008  - metoprolol 12.5 mg daily  - continue lipitor   # DM2: controlled. Hgb A1c 6.4 (11/08/13)  -Currently on Lantus 5 U with- Sen  SSi   - Per diabetes coordinator,consider changing patient's basal insulin to Levemir 10 units daily (home dose) and increasing SSI to Moderate scale - CBG's   # Diarrhea - ongoing for 2 days  # ESRD on HD: secondary to DM2. Dr. Posey Pronto is her nephrologist  - renal consulted  - HD this am  # Chronic dysphagia: patient with history of and a barium swallow study performed last year which showed esophageal dysmotility. EGD unremarkable  - protonix   FEN/GI: NPO for procedure, SLIV  PPx: heparin subQ   Disposition: SNF, pending improvement   Subjective:  Patient was able to work with PT in lumbar brace this AM and did not request pain medication, though she states that it was painful. Currently endorses back pain. Currently in dialysis, understands that she will have a procedure for her back today.  Objective: Temp:  [97.8 F (36.6 C)-98.6 F (37 C)] 97.8 F (36.6 C) (03/16 0830) Pulse Rate:  [58-60] 60 (03/16 0830) Resp:  [16-18] 17 (03/16 0830) BP: (114-146)/(37-97) 146/97 mmHg (03/16 0830) SpO2:  [95 %-100 %] 100 % (03/16 0830) Weight:  [63.549 kg (140 lb 1.6 oz)] 63.549 kg (140 lb 1.6 oz) (03/15 2140)  Physical Exam: General: Elderly female, speaking softly in dialysis HEENT: EOMI, PERRL, MMM,  Cardiovascular: W9N9, RRR, 3/6 systolic murmur heard  Respiratory: clear to auscultation, no increased WOB  Extremities: moves all freely, distal pulses intact Neuro: no gross deficits  MSK: movement of her LE induces pain.  Laboratory:  Recent Labs Lab  12/02/14 1159 12/03/14 0030 12/04/14 0716  WBC 11.9* 13.2* 8.9  HGB 11.3* 13.1 10.8*  HCT 34.4* 39.3 33.1*  PLT 248 307 274    Recent Labs Lab 12/02/14 1159 12/03/14 0704 12/04/14 0714  NA 138 139 138  K 4.3 4.1 5.1  CL 101 100 99  CO2 26 25 25   BUN 31* 15 33*  CREATININE 4.48* 2.79* 4.23*  CALCIUM 9.3 8.9 9.3  GLUCOSE 102* 175* 206*    Imaging/Diagnostic Tests: MR Lumbar Spine 3/15: 1.  Interval at new subtle compression fracture of the superior aspect of L1. 2. Old compression fractures of L2 and L5. 3. Possible acute or subacute component of the L5 fracture anteriorly and inferiorly but this is not definitive. 4. Slight increased encroachment on the spinal canal by the posterior superior aspect of L2 but there is no focal neural impingement.  Grandville Silos, Med Student 12/06/2014, 8:39 AM [place] Intern pager: [phone], text pages welcome

## 2014-12-06 NOTE — Progress Notes (Signed)
Occupational Therapy Cancellation Note:  Pt currently is HD.  Will continue attempts at OT eval.    Lucille Passy, OTR/L [place], OTR/L [phone]

## 2014-12-06 NOTE — Progress Notes (Signed)
Patient Name: [name] [name] Date of Encounter: 12/06/2014     Active Problems:   Type 2 diabetes, uncontrolled, with renal manifestation   Essential hypertension   End-stage renal disease (ESRD)   Hypertrophic obstructive cardiomyopathy-peak gradient 51 mmHg Jan 2015   Malnutrition of moderate degree   Elevated troponin   Compression fracture of L5 lumbar vertebra   Compression fracture   Fall    SUBJECTIVE  No chest pain.  Recently returned from HD. Complains of back pain.  CURRENT MEDS . amitriptyline  10 mg Oral QHS  . aspirin  81 mg Oral Daily  . atorvastatin  20 mg Oral q1800  . calcitonin (salmon)  1 spray Alternating Nares Daily  . docusate  100 mg Oral Daily  . febuxostat  40 mg Oral QHS  . feeding supplement (RESOURCE BREEZE)  1 Container Oral TID BM  . [START ON 12/07/2014] heparin  5,000 Units Subcutaneous 3 times per day  . insulin aspart  0-9 Units Subcutaneous TID WC  . insulin glargine  5 Units Subcutaneous QHS  . lanthanum  1,000 mg Oral TID WC  . loratadine  10 mg Oral Daily  . metoprolol tartrate  6.25 mg Oral BID  . multivitamin  1 tablet Oral QHS  . pantoprazole sodium  40 mg Oral QHS  . polyethylene glycol  17 g Oral Daily  . sodium chloride  3 mL Intravenous Q12H    OBJECTIVE  Filed Vitals:   12/06/14 1100 12/06/14 1130 12/06/14 1200 12/06/14 1212  BP: 113/42 103/45 87/47 128/53  Pulse: 78 78 79 80  Temp:    97 F (36.1 C)  TempSrc:    Oral  Resp:    16  Height:      Weight:    135 lb 12.9 oz (61.6 kg)  SpO2:    100%    Intake/Output Summary (Last 24 hours) at 12/06/14 1325 Last data filed at 12/06/14 1212  Gross per 24 hour  Intake      0 ml  Output   1920 ml  Net  -1920 ml   Filed Weights   12/05/14 2140 12/06/14 0820 12/06/14 1212  Weight: 140 lb 1.6 oz (63.549 kg) 138 lb 14.2 oz (63 kg) 135 lb 12.9 oz (61.6 kg)    PHYSICAL EXAM  General: Pleasant, NAD. Neuro: Alert and oriented X 3. Moves all extremities  spontaneously. Psych: Normal affect. HEENT:  Normal  Neck: Supple without bruits or JVD. Lungs:  Resp regular and unlabored, CTA. Heart: RRR no s3, s4, Grade 2/6 systolic murmur at base. Abdomen: Soft, non-tender, non-distended, BS + x 4.  Extremities: No clubbing, cyanosis or edema. DP/PT/Radials 2+ and equal bilaterally.  Accessory Clinical Findings  CBC  Recent Labs  12/04/14 0716 12/06/14 1150  WBC 8.9 8.7  HGB 10.8* 10.9*  HCT 33.1* 34.8*  MCV 94.6 96.9  PLT 274 812   Basic Metabolic Panel  Recent Labs  12/04/14 0714 12/06/14 1150  NA 138 136  K 5.1 5.3*  CL 99 93*  CO2 25 31  GLUCOSE 206* 219*  BUN 33* 49*  CREATININE 4.23* 4.98*  CALCIUM 9.3 9.7  PHOS 4.4 5.2*   Liver Function Tests  Recent Labs  12/04/14 0714 12/06/14 1150  ALBUMIN 2.4* 2.6*   No results for input(s): LIPASE, AMYLASE in the last 72 hours. Cardiac Enzymes No results for input(s): CKTOTAL, CKMB, CKMBINDEX, TROPONINI in the last 72 hours. BNP Invalid input(s): POCBNP D-Dimer No results for input(s):  DDIMER in the last 72 hours. Hemoglobin A1C No results for input(s): HGBA1C in the last 72 hours. Fasting Lipid Panel No results for input(s): CHOL, HDL, LDLCALC, TRIG, CHOLHDL, LDLDIRECT in the last 72 hours. Thyroid Function Tests No results for input(s): TSH, T4TOTAL, T3FREE, THYROIDAB in the last 72 hours.  Invalid input(s): FREET3  TELE    2D echo:  Study Conclusions  - Left ventricle: Slight intracavitary gradient. No LVOT gradeint. The cavity size was normal. Wall thickness was increased in a pattern of moderate LVH. The estimated ejection fraction was 60%. Wall motion was normal; there were no regional wall motion abnormalities. Findings consistent with left ventricular diastolic dysfunction. Doppler parameters are consistent with high ventricular filling pressure. - Mitral valve: Moderate annular calcification, but no MS. Mild MR. - Left atrium: The  atrium was moderately to severely dilated. - Right atrium: The atrium was mildly dilated. - Pulmonary arteries: PA peak pressure: 39 mm Hg (S).  Radiology/Studies  Dg Chest 1 View  12/02/2014   CLINICAL DATA:  Fall. Patient on dialysis and did not receive regular dialysis is yesterday due to fever.  EXAM: CHEST  1 VIEW  COMPARISON:  11/14/2013  FINDINGS: Marked cardiac enlargement noted. There is a left plural effusion present. Mild to moderate interstitial edema noted. There is multifocal airspace opacities within the left midlung and left base.  IMPRESSION: 1. Moderate CHF. 2. Multifocal airspace opacities in left lung may represent edema or infection.   Electronically Signed   By: Kerby Moors M.D.   On: 12/02/2014 13:10   Dg Lumbar Spine 2-3 Views  12/03/2014   CLINICAL DATA:  Low back pain since falling 2 days ago, history diabetes, hypertension, end-stage renal disease, coronary artery disease, prior back surgery with unspecified tight  EXAM: LUMBAR SPINE - 2-3 VIEW  COMPARISON:  MRI lumbar spine 01/17/2014, lumbar spine radiographs of 01/01/2014  FINDINGS: Five non-rib-bearing lumbar vertebrae.  Diffuse osseous demineralization.  Old superior endplate compression fracture of L5 vertebral body is identified identified; while this is new since prior radiographs of 01/01/2014, this was identified on the preceding MR though has probably slightly increased in degree of superior endplate compression since the prior MR.  Old superior endplate compression fracture of L2 vertebral body with again identified spinal augmentation procedure.  Remaining vertebra normal in height and alignment.  No definite acute fracture, subluxation or bone destruction.  Assessment of the lower lumbar spine on AP view is limited by superimposed retained contrast material within the colon.  LEFT SI joint preserved, RIGHT obscured.  Atherosclerotic calcifications aorta.  IMPRESSION: Old compression fracture L2 post spinal  augmentation procedure.  Old compression fracture L5 suspect slightly increased since 2015 MRI.  Diffuse osseous demineralization.   Electronically Signed   By: Lavonia Dana M.D.   On: 12/03/2014 14:46   Dg Sacrum/coccyx  12/02/2014   CLINICAL DATA:  fall at her nursing facility ([facility]), pt reports pain in her sacrum. Pt is a dialysis pt and did not receive her regular dialysis yesterday due to fever. AP view is obscured. Pt denies any recent contrast studies. Pt unwilling to lay flat due to pain level. Best obtainable images  EXAM: SACRUM AND COCCYX - 2+ VIEW  COMPARISON:  01/01/2014  FINDINGS: New compression fracture deformity of L5 with approximately 50% loss of height. Previous kyphoplasty/ vertebroplasty of L2 stable. Diffuse osteopenia. Old fracture deformities of the right superior and inferior pubic rami. Bilateral pelvic phleboliths. Extensive radiodense material in the colon as  if before.  IMPRESSION: 1. L5 compression fracture deformity, new since prior study. If patient is under consideration for kyphoplasty/vertebroplasty, recommend CT or MR lumbar spine to exclude burst fracture or posterior element involvement.   Electronically Signed   By: Lucrezia Europe M.D.   On: 12/02/2014 13:13   Mr Lumbar Spine Wo Contrast  12/05/2014   CLINICAL DATA:  Low back pain after falling yesterday. History of compression fractures of the spine.  EXAM: MRI LUMBAR SPINE WITHOUT CONTRAST  TECHNIQUE: Multiplanar, multisequence MR imaging of the lumbar spine was performed. No intravenous contrast was administered.  COMPARISON:  Radiographs dated 12/03/2014 and 01/01/2014 and MRIs dated 01/17/2014 and 09/24/2013  FINDINGS: Normal conus tip at L1-2. No significant abnormality of the paraspinal soft tissues.  T10-11 and T11-12:  Normal.  T12-L1: There is a new subtle probably acute or subacute benign appearing compression fracture of the superior endplate of L1. No protrusion of bone or disc material into spinal  canal. The fracture appears to involve the base of both pedicles.  L1-2: Old compression fracture of L2 treated with vertebroplasty. Protrusion of the posterior superior aspect of the L2 vertebral body into the spinal canal with moderate compression of the thecal sac, more prominent than on the prior exam. No focal neural impingement.  L2-3:  No significant abnormality.  L3-4: No significant abnormality. No neural impingement. Posterior decompression.  L4-5: Old severe compression fracture of the L5 vertebra, progressed since the prior study. There could be a tiny acute or subacute component of the fracture of the anterior inferior aspect of the L5 vertebral body.  Posterior decompression.  No neural impingement.  L5-S1: Chronic compression deformity of L5, increased since the prior study. There is a small focal area of edema in the anterior inferior aspect of the L5 vertebra. No neural impingement. The facet joints appear fused at L5-S1.  IMPRESSION: 1. Interval at new subtle compression fracture of the superior aspect of L1. 2. Old compression fractures of L2 and L5. 3. Possible acute or subacute component of the L5 fracture anteriorly and inferiorly but this is not definitive. 4. Slight increased encroachment on the spinal canal by the posterior superior aspect of L2 but there is no focal neural impingement.   Electronically Signed   By: Lorriane Shire M.D.   On: 12/05/2014 10:34    ASSESSMENT AND PLAN 1. Demand ischemia, peak troponin I 0.83 so far. No chest pain and ECG nonspecific. Echo shows good LV systolic function with EF 60% and no LVOT outflow gradient.  2. Status post fall without syncope.  3. L5 compression fracture secondary to fall. - pending MRI and considering for candidacy for vertebroplasty today  4. Apparent acute on chronic diastolic heart failure, prior LVEF 65-70% January 2015.    5. HOCM with 50 mmHg LVOT gradient January 2015. On lopressor 6.25mg   oral suspension BID. No gradient now.  Continue BB.  6. Known CAD, nonobstructive 2008.  7. ESRD on HD  No further cardiac tests planned at this time. Signed, Darlin Coco MD

## 2014-12-06 NOTE — Progress Notes (Signed)
Patient given soap sub enema, so far no BM, except for some transparent slimy mucous.

## 2014-12-07 ENCOUNTER — Inpatient Hospital Stay (HOSPITAL_COMMUNITY): Payer: Medicare Other

## 2014-12-07 LAB — GLUCOSE, CAPILLARY
GLUCOSE-CAPILLARY: 289 mg/dL — AB (ref 70–99)
GLUCOSE-CAPILLARY: 288 mg/dL — AB (ref 70–99)
Glucose-Capillary: 261 mg/dL — ABNORMAL HIGH (ref 70–99)
Glucose-Capillary: 199 mg/dL — ABNORMAL HIGH (ref 70–99)
Glucose-Capillary: 266 mg/dL — ABNORMAL HIGH (ref 70–99)

## 2014-12-07 MED ORDER — SENNOSIDES-DOCUSATE SODIUM 8.6-50 MG PO TABS
1.0000 | ORAL_TABLET | Freq: Two times a day (BID) | ORAL | Status: DC
  Administered 2014-12-07 – 2014-12-15 (×16): 1 via ORAL
  Filled 2014-12-07 (×19): qty 1

## 2014-12-07 MED ORDER — INSULIN GLARGINE 100 UNIT/ML ~~LOC~~ SOLN
10.0000 [IU] | Freq: Every day | SUBCUTANEOUS | Status: DC
  Filled 2014-12-07: qty 0.1

## 2014-12-07 NOTE — Progress Notes (Signed)
Family Medicine Teaching Service Daily Progress Note Intern Pager: [phone]  Patient name: [name] [name] Medical record number: [id number] Date of birth: Sep 06, 1926 Age: 81 y.o. Gender: female  Primary Care Provider: Ann Held, MD Consultants: ortho, cards Code Status: FULL   Pt Overview and Major Events to Date:  3/12: admitted for fall, found to have an L5 compression fx  3/13: lumbar films ordered  3/15: MRI lumbar spine to eval for vertebroplasty; per ortho 3/16: Patient had dialysis in AM, barium in colon. SS enema ineffective 3/17: Patient started on Senna S BID  Assessment and Plan: [name] [name] is a 81 y.o. female presenting with fall. PMH is significant for Hx L2 fracture w/ previous kypophlasty, ESRD on HD since 2015, mild non-obstructive CAD on cath in 2008 with evidence of HOCM, DM2, HTN, osteoporosis, GERD and dCHF.   # Fall/L5 compression fx: still in pain. Will have IR vertebroplasty 3/17 pending insurance approval and improvement of barium in bowel. Pt noted to have a large amount of barium in her bowel, given SS enema to evacuate which was ineffective. Start Senna S BID.  - fentanyl 12.5 mcg IV q4 PRN for severe pain - tramadol for moderate pain - tylenol for mild pain - Ortho consulted, Lumbar MRI obtained. Will have for IR vertebroplasty when approved and barium cleared - Continue Calcitonin (nasal spray) - SW made aware, consulted for possible SNF placement - PT recommended SNF / 24 hour supervision and assistance  # Elevated Troponin/Demand ischemia: hx of having troponins elevated. No reports of SOB or chest pain.  - troponins trending down   # Anemia of Chronic disease/MGUS: Has been seen by Dr. Marin Olp. Baseline hemoglobin 11.8 hematocrit 36.6. Blood studies showed a monoclonal spike of 0.18 g/dL. On a serum immunofixation, a IgG Kappa protein was noted. However, she had normal values of her immunoglobulins. Unsure of how aggressive treatment is.    # Acute on chronic dCHF: Received HD last night when she started need some O2  - ECHO 60% EF, grade 1 diastolic dysfunction  # HOCM,HTN, HLD, CAD: BP tenuous at 103/31 with home dose metoprolol 12.5 mg BID. Will decrease. - metoprolol 6.25 mg daily  - continue lipitor   # DM2: controlled. Hgb A1c 6.4 (11/08/13)  -Currently on Lantus 5 U with- Sen SSi. - Per diabetes coordinator,consider changing patient's basal insulin to Levemir 10 units daily (home dose) and increasing SSI to Moderate scale. - As pt NPO for surgery, changed to Moderate SSI and left 5U Lantus as is. - CBG's   # Diarrhea - none overnight  # ESRD on HD: secondary to DM2. Dr. Posey Pronto is her nephrologist  - renal consulted  - Last HD 3/16  # Chronic dysphagia: patient with history of and a barium swallow study performed last year which showed esophageal dysmotility. EGD unremarkable  - protonix   FEN/GI: NPO for procedure, SLIV  PPx: heparin subQ   Disposition: SNF pending surgery   Subjective:  States she slept well. Currently endorses 8/10 back pain but medicine was on its way. Understands that her back procedure is on hold pending barium and insurance. Asked about barium. Denies nausea, chest pain, SOB.   Objective: Temp:  [97 F (36.1 C)-98.8 F (37.1 C)] 97.8 F (36.6 C) (03/17 0534) Pulse Rate:  [54-80] 54 (03/17 0534) Resp:  [16-18] 16 (03/17 0534) BP: (87-157)/(36-97) 114/41 mmHg (03/17 0534) SpO2:  [96 %-100 %] 100 % (03/17 0534) Weight:  [61.6 kg (135 lb 12.9  oz)-63.776 kg (140 lb 9.6 oz)] 63.776 kg (140 lb 9.6 oz) (03/17 0534) Physical Exam: General: Elderly female resting comfortably in bed HEENT: EOMI, PERRL, MMM,  Cardiovascular: RR, 3/6 systolic murmur heard  Respiratory: clear to auscultation, no increased WOB  Extremities: moves all freely, distal pulses intact Neuro: no gross deficits   Laboratory:  Recent Labs Lab 12/03/14 0030 12/04/14 0716 12/06/14 1150  WBC  13.2* 8.9 8.7  HGB 13.1 10.8* 10.9*  HCT 39.3 33.1* 34.8*  PLT 307 274 303    Recent Labs Lab 12/03/14 0704 12/04/14 0714 12/06/14 1150  NA 139 138 136  K 4.1 5.1 5.3*  CL 100 99 93*  CO2 25 25 31   BUN 15 33* 49*  CREATININE 2.79* 4.23* 4.98*  CALCIUM 8.9 9.3 9.7  GLUCOSE 175* 206* 219*     Imaging/Diagnostic Tests: XR Abdomen 1 View COMPARISON: Portable abdominal film of December 06, 2014  FINDINGS: There remains considerable barium in the ascending and transverse portions of the colon. Barium has migrated distally since the previous study. There is a small amount of gas in the rectosigmoid and a moderate amount of gas in the splenic flexure  IMPRESSION: There is considerable residual barium within the colon with evidence of some distal transit since yesterday's study.  Echocardiogram 3/15 Study Conclusions  - Left ventricle: Slight intracavitary gradient. No LVOT gradeint. The cavity size was normal. Wall thickness was increased in a pattern of moderate LVH. The estimated ejection fraction was 60%. Wall motion was normal; there were no regional wall motion abnormalities. Findings consistent with left ventricular diastolic dysfunction. Doppler parameters are consistent with high ventricular filling pressure. - Mitral valve: Moderate annular calcification, but no MS. Mild MR. - Left atrium: The atrium was moderately to severely dilated. - Right atrium: The atrium was mildly dilated. - Pulmonary arteries: PA peak pressure: 39 mm Hg (S). Grandville Silos, Med Student 12/07/2014, 8:10 AM [place] Intern pager: [phone], text pages welcome  Upper Level Addendum:  I have seen and evaluated this patient along with Mr. Grandville Silos and reviewed the above note, making necessary revisions in [facility].   Clearance Coots, MD Family Medicine PGY-2

## 2014-12-07 NOTE — Progress Notes (Signed)
Physical Therapy Treatment Patient Details Name: [name] [name] MRN: [id number] DOB: 1925-11-13 Today's Date: 12/07/2014    History of Present Illness 81 yo F with PMH noted for L2 fx and kyphoplasty, ESRD on HD, CAD, DM2, HTN who presents with low back pain s/p fall yesterday. Found to have L5 compression fracture on initial films. Lumbar corset provided. MRI on 12/05/14 to evaluate whether pt is a candidate for vertebroplasty.     PT Comments    Pt progressing slowly towards physical therapy goals. Was willing to work with therapy despite her pain level and inability to get pain meds for another 30 minutes. +2 assist continues to be required for all mobility and transfers. Will continue to follow and progress as able per POC.   Follow Up Recommendations  SNF;Supervision/Assistance - 24 hour     Equipment Recommendations  None recommended by PT    Recommendations for Other Services       Precautions / Restrictions Precautions Precautions: Fall Precaution Comments: Bed to chair only. NO AMBULATION.  Required Braces or Orthoses: Spinal Brace Spinal Brace: Lumbar corset Restrictions Weight Bearing Restrictions: No    Mobility  Bed Mobility Overal bed mobility: Needs Assistance;+2 for physical assistance Bed Mobility: Rolling;Sidelying to Sit Rolling: Max assist Sidelying to sit: Max assist;+2 for physical assistance       General bed mobility comments: Assist for all aspects of bed mobility. Bed pad used to assist with scooting and rolling.   Transfers Overall transfer level: Needs assistance Equipment used: 2 person hand held assist Transfers: Stand Pivot Transfers;Sit to/from Stand Sit to Stand: Total assist;+2 physical assistance Stand pivot transfers: Total assist;+2 physical assistance       General transfer comment: +2 assist to power-up to full standing and take pivotal steps to [facility] and then around to the chair.  Ambulation/Gait             General  Gait Details: Unable at this time.     Stairs            Wheelchair Mobility    Modified Rankin (Stroke Patients Only)       Balance Overall balance assessment: Needs assistance Sitting-balance support: Feet supported;No upper extremity supported Sitting balance-Leahy Scale: Poor   Postural control: Posterior lean;Right lateral lean Standing balance support: Bilateral upper extremity supported;During functional activity Standing balance-Leahy Scale: Zero                      Cognition Arousal/Alertness: Awake/alert Behavior During Therapy: Flat affect Overall Cognitive Status: No family/caregiver present to determine baseline cognitive functioning                      Exercises      General Comments        Pertinent Vitals/Pain Pain Assessment: 0-10 Pain Score: 8  Pain Location: low back Pain Descriptors / Indicators: Aching;Grimacing;Guarding Pain Intervention(s): Limited activity within patient's tolerance;Monitored during session;Repositioned;Patient requesting pain meds-RN notified    Home Living                      Prior Function            PT Goals (current goals can now be found in the care plan section) Acute Rehab PT Goals Patient Stated Goal: Decrease pain PT Goal Formulation: With patient Time For Goal Achievement: 12/19/14 Potential to Achieve Goals: Fair Progress towards PT goals: Progressing toward goals    Frequency  Min 3X/week    PT Plan Current plan remains appropriate    Co-evaluation             End of Session Equipment Utilized During Treatment: Back brace Activity Tolerance: Patient limited by pain;Patient limited by fatigue Patient left: in chair;with call bell/phone within reach     Time: 1412-1437 PT Time Calculation (min) (ACUTE ONLY): 25 min  Charges:  $Therapeutic Activity: 23-37 mins                    G Codes:      Rolinda Roan 18-Dec-2014, 3:05 PM   Rolinda Roan, PT,  DPT Acute Rehabilitation Services Pager: [phone]

## 2014-12-07 NOTE — Progress Notes (Signed)
Inpatient Diabetes Program Recommendations  AACE/ADA: New Consensus Statement on Inpatient Glycemic Control (2013)  Target Ranges:  Prepandial:   less than 140 mg/dL      Peak postprandial:   less than 180 mg/dL (1-2 hours)      Critically ill patients:  140 - 180 mg/dL    Results for [name] [name], [name] [name] (MRN [id number]) as of 12/07/2014 14:12  Ref. Range 12/06/2014 07:37 12/06/2014 12:36 12/06/2014 16:30 12/06/2014 21:20  Glucose-Capillary Latest Range: 70-99 mg/dL 218 (H) 189 (H) 291 (H) 289 (H)    Results for [name] [name], [name] [name] (MRN [id number]) as of 12/07/2014 14:12  Ref. Range 12/07/2014 07:47 12/07/2014 12:17  Glucose-Capillary Latest Range: 70-99 mg/dL 261 (H) 199 (H)     Home DM Meds: Levemir 10 units daily  Regular 0-12 units tid per SSI  Current Orders: Lantus 5 units QHS  Novolog Moderate SSI    **Note fasting glucose elevated the last three days in a row.  **Patient takes Levemir 10 units daily at home.  **Note patient may be NPO at some point for back surgery, however, she would likely benefit from an increase to her home dose of basal insulin even if she is made NPO.    MD- Please consider changing patient's basal insulin to Levemir 10 units daily (home dose)     Will follow Wyn Quaker RN, MSN, CDE Diabetes Coordinator Inpatient Diabetes Program Team Pager: [phone] (8a-5p)

## 2014-12-07 NOTE — Progress Notes (Signed)
Patient ID: [name] [name], female   DOB: 09/09/1926, 81 y.o.   MRN: [id number]   Pt tentatively scheduled for L5 KP when timing is appropriate. Unfortunately, Insurance still pending---can take 3-5 days KUB shows continued residual barium; soap suds enema - no relief  Have discussed with Dr Earleen Newport Rec: Admitting MD can treat constipation any way feels necessary          We will continue to try to pre certify procedure with Ins          ( peer to peer review today hopefully)          Consider procedure as OP if MD feels this is more appropriate  I have spoken to dtr and have answered all questions to satisfaction  Please contact IR PA Jannifer Franklin if needed [phone]

## 2014-12-07 NOTE — Progress Notes (Signed)
, Subjective:  Back only when she moves , for Vertebroplast today /hd yest. On schedule tolerated  Objective Vital signs in last 24 hours: Filed Vitals:   12/06/14 1212 12/06/14 1705 12/06/14 2355 12/07/14 0534  BP: 128/53 99/42 132/36 114/41  Pulse: 80 60 60 54  Temp: 97 F (36.1 C) 98.8 F (37.1 C) 98.2 F (36.8 C) 97.8 F (36.6 C)  TempSrc: Oral Oral Oral Axillary  Resp: 16 16 16 16   Height:      Weight: 61.6 kg (135 lb 12.9 oz)  63.776 kg (140 lb 9.6 oz) 63.776 kg (140 lb 9.6 oz)  SpO2: 100% 100% 100% 100%  Physical Exam: General appearance: NAD But uncomfortable co back pain/  Resp:CTA  Cardio: Irregular rhythm, VR stable, no murmur or rub GI: + BS, soft and nontender Extremities: No pedal edema  Access: AVF @ RUA with + bruit  HD: MWF [facility] 3h 54min 65 kg F160 400/A1.5 2/2.0 Bath Heparin 2000 RUA AVF Venofer 50/wk, no other meds  Problem/Plan 1. Acute L5 compression fracture - sec to fall 3/12./ Vertebroplast today by IR, Ortho following 2. Pulmonary edema / hypoxemia - moderate CHF per CXR 3/12,now100 % o2 sat with [place] O2/ hd uf with now  lower edw 63.7 kg BEDWT   3. ESRD - HD on MWF @ [facility]. Next HD tomorrow. 4. HTN/Volume - BP 114/41on Metoprolol 6.25 bid ;  Below prior  edw/  lower EDW at dc 5. Anemia - Hgb 10.9 Venofer qwk. No esa fu hgbs  6. Sec HPT - Ca 9.7 (10.8 corrected), P 5.2 Fosrenol 1 g with meals./ no vit d / use low ca hd bath 7. Nutrition - Alb 2.4.>2.6/ renal carb diet/ renal vit/ add breeze supplement 8. Hx L2 compression fx - s/p kyphoplasty 09/2013. 9. DM 2 - insulin per primary 10. Constipation =  miralax / prn sorbitol Ortho rx  [name], PA-C [place] [phone] 12/07/2014,8:08 AM  LOS: 5 days  I have seen and examined this patient and agree with the plan of care. Waiting for kyphoplasty. She's actually out of bed in a chair today and finally got a smile. She looks much more lively in a  chair. HD in the AM.  [name], MD 12/07/2014, 3:03 PM  Labs: Basic Metabolic Panel:  Recent Labs Lab 12/03/14 0704 12/04/14 0714 12/06/14 1150  NA 139 138 136  K 4.1 5.1 5.3*  CL 100 99 93*  CO2 25 25 31   GLUCOSE 175* 206* 219*  BUN 15 33* 49*  CREATININE 2.79* 4.23* 4.98*  CALCIUM 8.9 9.3 9.7  PHOS 4.0 4.4 5.2*   Liver Function Tests:  Recent Labs Lab 12/03/14 0704 12/04/14 0714 12/06/14 1150  ALBUMIN 2.6* 2.4* 2.6*    CBC:  Recent Labs Lab 12/02/14 1159 12/03/14 0030 12/04/14 0716 12/06/14 1150  WBC 11.9* 13.2* 8.9 8.7  NEUTROABS 9.9*  --   --   --   HGB 11.3* 13.1 10.8* 10.9*  HCT 34.4* 39.3 33.1* 34.8*  MCV 93.7 93.8 94.6 96.9  PLT 248 307 274 303   Cardiac Enzymes:  Recent Labs Lab 12/02/14 1159 12/02/14 2000 12/03/14 0030 12/03/14 0704  TROPONINI 0.56* 0.69* 0.83* 0.56*   CBG:  Recent Labs Lab 12/06/14 0737 12/06/14 1236 12/06/14 1630 12/06/14 2120 12/07/14 0747  GLUCAP 218* 189* 291* 289* 261*    Studies/Results:  Medications:   . amitriptyline  10 mg Oral QHS  . aspirin  81 mg Oral  Daily  . atorvastatin  20 mg Oral q1800  . calcitonin (salmon)  1 spray Alternating Nares Daily  . docusate  100 mg Oral Daily  . febuxostat  40 mg Oral QHS  . feeding supplement (RESOURCE BREEZE)  1 Container Oral TID BM  . heparin  5,000 Units Subcutaneous 3 times per day  . insulin aspart  0-15 Units Subcutaneous TID WC  . insulin glargine  5 Units Subcutaneous QHS  . lanthanum  1,000 mg Oral TID WC  . loratadine  10 mg Oral Daily  . metoprolol tartrate  6.25 mg Oral BID  . multivitamin  1 tablet Oral QHS  . pantoprazole sodium  40 mg Oral QHS  . polyethylene glycol  17 g Oral Daily  . sodium chloride  3 mL Intravenous Q12H

## 2014-12-08 LAB — GLUCOSE, CAPILLARY
GLUCOSE-CAPILLARY: 252 mg/dL — AB (ref 70–99)
GLUCOSE-CAPILLARY: 253 mg/dL — AB (ref 70–99)
GLUCOSE-CAPILLARY: 132 mg/dL — AB (ref 70–99)
Glucose-Capillary: 232 mg/dL — ABNORMAL HIGH (ref 70–99)

## 2014-12-08 LAB — CBC
HCT: 34.7 % — ABNORMAL LOW (ref 36.0–46.0)
HEMATOCRIT: 35.9 % — AB (ref 36.0–46.0)
Hemoglobin: 11.8 g/dL — ABNORMAL LOW (ref 12.0–15.0)
Hemoglobin: 11.1 g/dL — ABNORMAL LOW (ref 12.0–15.0)
MCH: 31.4 pg (ref 26.0–34.0)
MCH: 30.7 pg (ref 26.0–34.0)
MCHC: 32.9 g/dL (ref 30.0–36.0)
MCHC: 32 g/dL (ref 30.0–36.0)
MCV: 95.5 fL (ref 78.0–100.0)
MCV: 95.9 fL (ref 78.0–100.0)
Platelets: 262 10*3/uL (ref 150–400)
Platelets: 273 10*3/uL (ref 150–400)
RBC: 3.76 MIL/uL — ABNORMAL LOW (ref 3.87–5.11)
RBC: 3.62 MIL/uL — ABNORMAL LOW (ref 3.87–5.11)
RDW: 14.3 % (ref 11.5–15.5)
RDW: 14.2 % (ref 11.5–15.5)
WBC: 9.9 10*3/uL (ref 4.0–10.5)
WBC: 9.9 10*3/uL (ref 4.0–10.5)

## 2014-12-08 LAB — BASIC METABOLIC PANEL
Anion gap: 11 (ref 5–15)
BUN: 62 mg/dL — ABNORMAL HIGH (ref 6–23)
CALCIUM: 9.6 mg/dL (ref 8.4–10.5)
CO2: 30 mmol/L (ref 19–32)
CREATININE: 5.45 mg/dL — AB (ref 0.50–1.10)
Chloride: 92 mmol/L — ABNORMAL LOW (ref 96–112)
GFR calc Af Amer: 7 mL/min — ABNORMAL LOW (ref >90)
GFR calc non Af Amer: 6 mL/min — ABNORMAL LOW (ref >90)
Glucose, Bld: 264 mg/dL — ABNORMAL HIGH (ref 70–99)
Potassium: 5 mmol/L (ref 3.5–5.1)
Sodium: 133 mmol/L — ABNORMAL LOW (ref 135–145)

## 2014-12-08 LAB — RENAL FUNCTION PANEL
ANION GAP: 12 (ref 5–15)
Albumin: 2.6 g/dL — ABNORMAL LOW (ref 3.5–5.2)
BUN: 62 mg/dL — ABNORMAL HIGH (ref 6–23)
CHLORIDE: 91 mmol/L — AB (ref 96–112)
CO2: 31 mmol/L (ref 19–32)
CREATININE: 5.58 mg/dL — AB (ref 0.50–1.10)
Calcium: 9.3 mg/dL (ref 8.4–10.5)
GFR calc Af Amer: 7 mL/min — ABNORMAL LOW (ref >90)
GFR calc non Af Amer: 6 mL/min — ABNORMAL LOW (ref >90)
GLUCOSE: 222 mg/dL — AB (ref 70–99)
Phosphorus: 4.5 mg/dL (ref 2.3–4.6)
Potassium: 4.5 mmol/L (ref 3.5–5.1)
Sodium: 134 mmol/L — ABNORMAL LOW (ref 135–145)

## 2014-12-08 MED ORDER — MILK AND MOLASSES ENEMA
1.0000 | Freq: Once | RECTAL | Status: AC
  Administered 2014-12-08: 250 mL via RECTAL
  Filled 2014-12-08: qty 250

## 2014-12-08 MED ORDER — INSULIN DETEMIR 100 UNIT/ML ~~LOC~~ SOLN
10.0000 [IU] | Freq: Every day | SUBCUTANEOUS | Status: DC
  Administered 2014-12-08 – 2014-12-13 (×6): 10 [IU] via SUBCUTANEOUS
  Filled 2014-12-08 (×8): qty 0.1

## 2014-12-08 NOTE — Clinical Social Work Placement (Addendum)
Clinical Social Work Department CLINICAL SOCIAL WORK PLACEMENT NOTE 12/08/2014  Patient:  [name] [name], [name] [name]  Account Number:  [id number] Admit date:  12/02/2014  Clinical Social Worker:  Joleigh Mineau Givens, LCSW  Date/time:  12/08/2014 12:00 M  Clinical Social Work is seeking post-discharge placement for this patient at the following level of care:   SKILLED NURSING   (*CSW will update this form in Epic as items are completed)     Patient/family provided with [place] Department of Clinical Social Work's list of facilities offering this level of care within the geographic area requested by the patient (or if unable, by the patient's family).  12/08/2014  Patient/family informed of their freedom to choose among providers that offer the needed level of care, that participate in Medicare, Medicaid or managed care program needed by the patient, have an available bed and are willing to accept the patient.    Patient/family informed of [facility]' ownership interest in [facility], as well as of the fact that they are under no obligation to receive care at this facility.  PASARR submitted to EDS in 2008  PASARR number received in 2008   FL2 transmitted to all facilities in geographic area requested by pt/family on 12/13/14  FL2 transmitted to all facilities within larger geographic area on   Patient informed that his/her managed care company has contracts with or will negotiate with  certain facilities, including the following:   Patient/family informed of bed offers received: 12/13/14  Patient chooses bed at [facility] Physician recommends and patient chooses bed at    Patient to be transferred to [facility] on 12/15/14 Patient to be transferred to facility by ambulance Patient and family notified of transfer on 12/15/14 Name of family member notified: Katheran Awe, daughter   The following physician request were entered in Epic:  Additional Comments: 12/08/14 -  Patient will have back surgery next week.     Aniyia Rane Givens, MSW, LCSW Licensed Clinical Social Worker [place] [phone]

## 2014-12-08 NOTE — Clinical Social Work Psychosocial (Signed)
Clinical Social Work Department BRIEF PSYCHOSOCIAL ASSESSMENT 12/08/2014  Patient:  [name] [name], [name] [name]     Account Number:  [id number]     Admit date:  12/02/2014  Clinical Social Worker:  Frederico Hamman  Date/Time:  12/08/2014 01:08 PM  Referred by:  Physician  Date Referred:  12/03/2014 Referred for  SNF Placement   Other Referral:   Interview type:  Patient Other interview type:   CSW initally talked with patient, then spoke with daughter by phone.    PSYCHOSOCIAL DATA Living Status:  FACILITY Admitted from facility:  Other Level of care:  Assisted Living Primary support name:  Katheran Awe Primary support relationship to patient:  CHILD, ADULT Degree of support available:   Daughter supportive and very involved with patient's care. Patient requested that [place] talk with her daughter Katheran Awe; (c) [phone] and (h) [phone].    CURRENT CONCERNS Current Concerns  Post-Acute Placement   Other Concerns:    SOCIAL WORK ASSESSMENT / PLAN CSW initially visited with patient who when asked how she is doing pleasantly remarked, "I'm in pain and I can't poop". The nurse came in shortly afterwards to assist patient and she gave CSW permission to contact her daughter.    Talked with daughter, Katheran Awe by phone regarding discharge planning and recommendation of ST rehab. Daughter aware that patient is having surgery next week and commented on this, however CSW explained the benefits of beginning d/c planning early and daughter expressed understanding. Ms. Orest Dikes preference would be for patient to return to [facility] if they can take care of her, but if not her facility preferences are: #1-[facility] and #2-[facility]. CSW informed that Ms. White's 79 year old mother-in-law and 79 year old sister in law also live at [facility].   Assessment/plan status:  Psychosocial Support/Ongoing Assessment of Needs Other assessment/ plan:   Information/referral to  community resources:   None needed or requested at this time.    PATIENT'S/FAMILY'S RESPONSE TO PLAN OF CARE: Patient very pleasant and agreeable to talking with CSW, however due to her physical/medical needs, she allowed CSW to contact daughter. Patient's daughter agreeable to talking with CSW and was open and receptive to SNF placement if going back to ALF with rehab is not appropriate.       Debria Broecker Givens, MSW, LCSW Licensed Clinical Social Worker [place] [phone]

## 2014-12-08 NOTE — Progress Notes (Signed)
Patient was seen on dialysis and the procedure was supervised.   BFR 400  BP is 118/55.  AP -140  VP 190 UF 2.5 liters Bath 2K  Patient appears to be tolerating treatment well.  No changes to the prescription.  No complaints.  Otelia Santee, MD 12/08/2014, 2:52 PM

## 2014-12-08 NOTE — Progress Notes (Signed)
Subjective:   Having a lot of pain, medication in providing relief. Was able to sit in chair 1.5 hours yesterday.   Objective Filed Vitals:   12/07/14 1644 12/07/14 2047 12/08/14 0521 12/08/14 0758  BP: 152/50 131/38 113/36   Pulse: 61 66 62 60  Temp: 97.7 F (36.5 C) 97.6 F (36.4 C) 97.8 F (36.6 C)   TempSrc: Oral Oral Oral   Resp: 16 18 16    Height:      Weight:  62.687 kg (138 lb 3.2 oz)    SpO2: 100% 100% 100% 95%   Physical Exam General: alert and oriented. No acute distress.  Heart: RRR 2/6 systolic murmur  Lungs: CTA, unlabored  Abdomen: soft, nontender +BS  Extremities: no edema  Dialysis Access:  R AVf +b.t  HD: MWF [facility] 3h 11min 65 kg F160 400/A1.5 2/2.0 Bath Heparin 2000 RUA AVF Venofer 50/wk, no other meds  Problem/Plan 1. Acute L5 compression fracture - sec to fall 3/12.Ortho following for IR verterbroplasty when insurance approves 2. Pulmonary edema / hypoxemia - moderate CHF per CXR 3/12,now100 % o2 sat with [place] O2/ hd uf with now-lower wts per bedwt. Needs lower edw at DC 3. ESRD - HD on MWF @ [facility]. Next HD today 4. HTN/Volume - BP 113/36on Metoprolol 6.25 bid ; Below prior edw/  lower EDW at dc 5. Anemia - Hgb 10.9 Venofer qwk. No esa- watch CBC 6. Sec HPT - Ca 9.7 (10.8 corrected), P 5.2 Fosrenol 1 g with meals./ no vit d / use low ca hd bath 7. Nutrition - Alb 2.4.>2.6/ renal carb diet/ renal vit/ add breeze supplement 8. Hx L2 compression fx - s/p kyphoplasty 09/2013. 9. DM 2 - insulin per primary 10. Constipation = miralax / prn sorbitol Ortho rx 11. dispo- PT recommends SNF. She is going to have to sit in recliner 5 hrs to be able tolerate outpt HD  Shelle Iron, NP [place] [phone] 12/08/2014,9:37 AM  LOS: 6 days    Additional Objective Labs: Basic Metabolic Panel:  Recent Labs Lab 12/03/14 0704 12/04/14 0714 12/06/14 1150  NA 139 138 136  K 4.1 5.1 5.3*  CL 100 99 93*  CO2 25 25 31    GLUCOSE 175* 206* 219*  BUN 15 33* 49*  CREATININE 2.79* 4.23* 4.98*  CALCIUM 8.9 9.3 9.7  PHOS 4.0 4.4 5.2*   Liver Function Tests:  Recent Labs Lab 12/03/14 0704 12/04/14 0714 12/06/14 1150  ALBUMIN 2.6* 2.4* 2.6*   No results for input(s): LIPASE, AMYLASE in the last 168 hours. CBC:  Recent Labs Lab 12/02/14 1159 12/03/14 0030 12/04/14 0716 12/06/14 1150  WBC 11.9* 13.2* 8.9 8.7  NEUTROABS 9.9*  --   --   --   HGB 11.3* 13.1 10.8* 10.9*  HCT 34.4* 39.3 33.1* 34.8*  MCV 93.7 93.8 94.6 96.9  PLT 248 307 274 303   Blood Culture    Component Value Date/Time   SDES URINE, CATHETERIZED 12/02/2014 1528   SPECREQUEST NONE 12/02/2014 1528   CULT  12/02/2014 1528    LACTOBACILLUS SPECIES Note: Standardized susceptibility testing for this organism is not available. Performed at [facility] 12/05/2014 FINAL 12/02/2014 1528    Cardiac Enzymes:  Recent Labs Lab 12/02/14 1159 12/02/14 2000 12/03/14 0030 12/03/14 0704  TROPONINI 0.56* 0.69* 0.83* 0.56*   CBG:  Recent Labs Lab 12/07/14 0747 12/07/14 1217 12/07/14 1640 12/07/14 2041 12/08/14 0757  GLUCAP 261* 199* 266* 288* 252*  Iron Studies: No results for input(s): IRON, TIBC, TRANSFERRIN, FERRITIN in the last 72 hours. @lablastinr3 @ Studies/Results: Dg Abd Portable 1v  12/07/2014   CLINICAL DATA:  Assess for residual barium  EXAM: PORTABLE ABDOMEN - 1 VIEW  COMPARISON:  Portable abdominal film of December 06, 2014  FINDINGS: There remains considerable barium in the ascending and transverse portions of the colon. Barium has migrated distally since the previous study. There is a small amount of gas in the rectosigmoid and a moderate amount of gas in the splenic flexure  IMPRESSION: There is considerable residual barium within the colon with evidence of some distal transit since yesterday's study.   Electronically Signed   By: David  Martinique   On: 12/07/2014 07:21   Dg Abd Portable  1v  12/06/2014   CLINICAL DATA:  Back pain  EXAM: PORTABLE ABDOMEN - 1 VIEW  COMPARISON:  None.  FINDINGS: There is a moderate amount of barium within the colon and to a lesser degree small bowel. There is no bowel dilatation to suggest obstruction. There is no evidence of pneumoperitoneum, portal venous gas or pneumatosis. There are no pathologic calcifications along the expected course of the ureters.The osseous structures are unremarkable.  IMPRESSION: No bowel obstruction.   Electronically Signed   By: Kathreen Devoid   On: 12/06/2014 13:45   Medications:   . amitriptyline  10 mg Oral QHS  . aspirin  81 mg Oral Daily  . atorvastatin  20 mg Oral q1800  . calcitonin (salmon)  1 spray Alternating Nares Daily  . febuxostat  40 mg Oral QHS  . feeding supplement (RESOURCE BREEZE)  1 Container Oral TID BM  . heparin  5,000 Units Subcutaneous 3 times per day  . insulin aspart  0-15 Units Subcutaneous TID WC  . insulin glargine  10 Units Subcutaneous QHS  . lanthanum  1,000 mg Oral TID WC  . loratadine  10 mg Oral Daily  . metoprolol tartrate  6.25 mg Oral BID  . multivitamin  1 tablet Oral QHS  . pantoprazole sodium  40 mg Oral QHS  . polyethylene glycol  17 g Oral Daily  . senna-docusate  1 tablet Oral BID  . sodium chloride  3 mL Intravenous Q12H

## 2014-12-08 NOTE — Progress Notes (Signed)
Subjective:     Patient reports pain as 4 on 0-10 scale.  I came by Dialysis to check on Mrs. Spikes.She is still waiting on Insurance approval for her Angioplasty. She is very weak and is able to be up in a chair for a short time.I do not feel it is safe at this time to ambulate her until she gains more strength.  Objective: Vital signs in last 24 hours: Temp:  [97.6 F (36.4 C)-97.8 F (36.6 C)] 97.6 F (36.4 C) (03/18 1311) Pulse Rate:  [59-67] 65 (03/18 1635) Resp:  [15-18] 15 (03/18 1311) BP: (74-131)/(33-55) 106/45 mmHg (03/18 1635) SpO2:  [95 %-100 %] 96 % (03/18 1311) Weight:  [61.553 kg (135 lb 11.2 oz)-62.687 kg (138 lb 3.2 oz)] 61.553 kg (135 lb 11.2 oz) (03/18 1311)  Intake/Output from previous day: 03/17 0701 - 03/18 0700 In: 860 [P.O.:860] Out: 50 [Urine:50] Intake/Output this shift:     Recent Labs  12/06/14 1150 12/08/14 0935 12/08/14 1330  HGB 10.9* 11.8* 11.1*    Recent Labs  12/08/14 0935 12/08/14 1330  WBC 9.9 9.9  RBC 3.76* 3.62*  HCT 35.9* 34.7*  PLT 262 273    Recent Labs  12/08/14 0935 12/08/14 1330  NA 133* 134*  K 5.0 4.5  CL 92* 91*  CO2 30 31  BUN 62* 62*  CREATININE 5.45* 5.58*  GLUCOSE 264* 222*  CALCIUM 9.6 9.3    Recent Labs  12/06/14 1219  INR 0.97    still too weak to ambulate.  Assessment/Plan:     Up with therapy  Balthazar Dooly A 12/08/2014, 5:11 PM

## 2014-12-08 NOTE — Progress Notes (Signed)
Family Medicine Teaching Service Daily Progress Note Intern Pager: [phone]  Patient name: [name] [name] Medical record number: [id number] Date of birth: October 05, 1925 Age: 81 y.o. Gender: female  Primary Care Provider: Ann Held, MD Consultants: ortho, cards Code Status: FULL   Pt Overview and Major Events to Date:  3/12: admitted for fall, found to have an L5 compression fx  3/13: lumbar films ordered  3/15: MRI lumbar spine to eval for vertebroplasty; per ortho 3/16: Patient had dialysis in AM, barium in colon. SS enema ineffective 3/17: Patient started on Senna S BID 3/18: Milk and molasses enema   Assessment and Plan: [name] [name] is a 81 y.o. female presenting with fall. PMH is significant for Hx L2 fracture w/ previous kypophlasty, ESRD on HD since 2015, mild non-obstructive CAD on cath in 2008 with evidence of HOCM, DM2, HTN, osteoporosis, GERD and dCHF.   # Fall/L5 compression fx: still in pain. Will have IR vertebroplasty 3/17 pending insurance approval and improvement of barium in bowel. Pt noted to have a large amount of barium in her bowel, given SS enema to evacuate which was ineffective. Start Senna S BID.  - fentanyl 12.5 mcg IV q4 PRN for severe pain - tramadol for moderate pain - tylenol for mild pain - Ortho consulted, Lumbar MRI obtained. Will have for IR vertebroplasty when approved and barium cleared - Continue Calcitonin (nasal spray) - Milk and molasses enema ordered on 3/18 to induce BM - SW made aware, consulted for possible SNF placement - PT recommended SNF / 24 hour supervision and assistance  # Elevated Troponin/Demand ischemia: hx of having troponins elevated. No reports of SOB or chest pain.  - troponins trending down   # Anemia of Chronic disease/MGUS: Has been seen by Dr. Marin Olp. Baseline hemoglobin 11.8 hematocrit 36.6. Blood studies showed a monoclonal spike of 0.18 g/dL. On a serum immunofixation, a IgG Kappa protein was noted.  However, she had normal values of her immunoglobulins. Unsure of how aggressive treatment is.   # Acute on chronic dCHF: Received HD last night when she started need some O2  - ECHO 60% EF, grade 1 diastolic dysfunction  # HOCM,HTN, HLD, CAD: BP tenuous at 103/31 with home dose metoprolol 12.5 mg BID. Will decrease. - metoprolol 6.25 mg daily  - continue lipitor   # DM2: controlled. Hgb A1c 6.4 (11/08/13)  -Currently on Lantus 5 U with- Sen SSi. - Per diabetes coordinator,consider changing patient's basal insulin to Levemir 10 units daily (home dose) and increasing SSI to Moderate scale. - As pt NPO for surgery, changed to Moderate SSI and back on home dose Levemir (10u QD) - CBG's   # Diarrhea - none overnight  # ESRD on HD: secondary to DM2. Dr. Posey Pronto is her nephrologist  - renal consulted  - Last HD 3/16 >> next HD 3/18 (today)  # Chronic dysphagia: patient with history of and a barium swallow study performed last year which showed esophageal dysmotility. EGD unremarkable  - protonix   FEN/GI: NPO for procedure, SLIV  PPx: heparin subQ   Disposition: SNF pending surgery   Subjective:  States she slept well. Currently endorses 8/10 back pain. Says she understands the necessity to have BM -- last BM was last Friday (3/11)  Objective: Temp:  [97.6 F (36.4 C)-97.8 F (36.6 C)] 97.8 F (36.6 C) (03/18 0521) Pulse Rate:  [60-66] 60 (03/18 0758) Resp:  [16-18] 16 (03/18 0521) BP: (113-152)/(36-50) 113/36 mmHg (03/18 0521) SpO2:  [95 %-100 %]  95 % (03/18 0758) Weight:  [138 lb 3.2 oz (62.687 kg)] 138 lb 3.2 oz (62.687 kg) (03/17 2047) Physical Exam: General: Elderly female resting in bed HEENT: EOMI, PERRL, MMM Cardiovascular: RR, 3/6 systolic murmur heard  Respiratory: clear to auscultation, no increased WOB  Extremities: moves all freely, distal pulses intact Neuro: no gross deficits   Laboratory:  Recent Labs Lab 12/04/14 0716 12/06/14 1150  12/08/14 0935  WBC 8.9 8.7 9.9  HGB 10.8* 10.9* 11.8*  HCT 33.1* 34.8* 35.9*  PLT 274 303 262    Recent Labs Lab 12/04/14 0714 12/06/14 1150 12/08/14 0935  NA 138 136 133*  K 5.1 5.3* 5.0  CL 99 93* 92*  CO2 25 31 30   BUN 33* 49* 62*  CREATININE 4.23* 4.98* 5.45*  CALCIUM 9.3 9.7 9.6  GLUCOSE 206* 219* 264*     Imaging/Diagnostic Tests: XR Abdomen 1 View COMPARISON: Portable abdominal film of December 06, 2014  IMPRESSION: There is considerable residual barium within the colon with evidence of some distal transit since yesterday's study.  Echocardiogram 3/15 Study Conclusions  - Left ventricle: Slight intracavitary gradient. No LVOT gradeint. The cavity size was normal. Wall thickness was increased in a pattern of moderate LVH. The estimated ejection fraction was 60%. Wall motion was normal; there were no regional wall motion abnormalities. Findings consistent with left ventricular diastolic dysfunction. Doppler parameters are consistent with high ventricular filling pressure. - Mitral valve: Moderate annular calcification, but no MS. Mild MR. - Left atrium: The atrium was moderately to severely dilated. - Right atrium: The atrium was mildly dilated. - Pulmonary arteries: PA peak pressure: 39 mm Hg (S).   Elberta Leatherwood, MD 12/08/2014, 12:05 PM [place] Intern pager: [phone], text pages welcome

## 2014-12-08 NOTE — Progress Notes (Signed)
Patient ID: [name] [name], female   DOB: [birth year], 81 y.o.   MRN: [id number]    Insurance still in pre certification authorization  Will keep pt on IR radar for [place] procedure Dtr is aware of delay  Will also need clean KUB to move forward with procedure

## 2014-12-09 LAB — HEPATITIS B SURFACE ANTIGEN: HEP B S AG: NEGATIVE

## 2014-12-09 LAB — GLUCOSE, CAPILLARY
GLUCOSE-CAPILLARY: 251 mg/dL — AB (ref 70–99)
GLUCOSE-CAPILLARY: 207 mg/dL — AB (ref 70–99)
Glucose-Capillary: 158 mg/dL — ABNORMAL HIGH (ref 70–99)
Glucose-Capillary: 229 mg/dL — ABNORMAL HIGH (ref 70–99)

## 2014-12-09 MED ORDER — POLYETHYLENE GLYCOL 3350 17 GM/SCOOP PO POWD
1.0000 | Freq: Once | ORAL | Status: AC
  Administered 2014-12-09: 255 g via ORAL
  Filled 2014-12-09 (×2): qty 255

## 2014-12-09 NOTE — Progress Notes (Signed)
Family Medicine Teaching Service Daily Progress Note Intern Pager: [phone]  Patient name: [name] [name] Medical record number: [id number] Date of birth: 1926/08/11 Age: 81 y.o. Gender: female  Primary Care Provider: Ann Held, MD Consultants: ortho, cards Code Status: FULL   Pt Overview and Major Events to Date:  3/12: admitted for fall, found to have an L5 compression fx  3/13: lumbar films ordered  3/15: MRI lumbar spine to eval for vertebroplasty; per ortho 3/16: Patient had dialysis in AM, barium in colon. SS enema ineffective 3/17: Patient started on Senna S BID 3/18: Milk and molasses enema  3/19: "Bowel Prep"  Assessment and Plan: COMFORT IVERSEN is a 81 y.o. female presenting with fall. PMH is significant for Hx L2 fracture w/ previous kypophlasty, ESRD on HD since 2015, mild non-obstructive CAD on cath in 2008 with evidence of HOCM, DM2, HTN, osteoporosis, GERD and dCHF.   # Fall/L5 compression fx: still in pain. Will have IR vertebroplasty 3/17 pending insurance approval and improvement of barium in bowel. Pt noted to have a large amount of barium in her bowel, given SS enema to evacuate which was ineffective. Start Senna S BID.  - fentanyl 12.5 mcg IV q4 PRN for severe pain - tramadol for moderate pain - tylenol for mild pain - Ortho consulted, Lumbar MRI obtained. Will have for IR vertebroplasty when approved and barium cleared - Continue Calcitonin (nasal spray) - Milk and molasses enema ordered on 3/18 to induce BM >> "very small" BM this AM  - Bowel prep ordered to induce BM >> will be followed by KUB once it occurs - SW made aware, consulted for possible SNF placement - PT recommended SNF / 24 hour supervision and assistance  # Elevated Troponin/Demand ischemia: hx of having troponins elevated. No reports of SOB or chest pain.  - troponins trending down   # Anemia of Chronic disease/MGUS: Has been seen by Dr. Marin Olp. Baseline hemoglobin 11.8  hematocrit 36.6. Blood studies showed a monoclonal spike of 0.18 g/dL. On a serum immunofixation, a IgG Kappa protein was noted. However, she had normal values of her immunoglobulins. Unsure of how aggressive treatment is.   # Acute on chronic dCHF: Received HD last night when she started need some O2  - ECHO 60% EF, grade 1 diastolic dysfunction  # HOCM,HTN, HLD, CAD: BP tenuous at 103/31 with home dose metoprolol 12.5 mg BID. Will decrease. - metoprolol 6.25 mg daily  - continue lipitor   # DM2: controlled. Hgb A1c 6.4 (11/08/13)  -Currently on Lantus 5 U with- Sen SSi. - Per diabetes coordinator,consider changing patient's basal insulin to Levemir 10 units daily (home dose) and increasing SSI to Moderate scale. - As pt NPO for surgery, changed to Moderate SSI and back on home dose Levemir (10u QD) - CBG's   # Diarrhea - none overnight  # ESRD on HD: secondary to DM2. Dr. Posey Pronto is her nephrologist  - renal consulted  - Last HD 3/18   # Chronic dysphagia: patient with history of and a barium swallow study performed last year which showed esophageal dysmotility. EGD unremarkable  - protonix   FEN/GI: NPO for procedure, SLIV  PPx: heparin subQ   Disposition: SNF pending surgery   Subjective:  States she slept well. Currently endorses 8/10 back pain. Small BM this AM. Appetite is still present. Last BM was last Friday (3/11).   Objective: Temp:  [97.6 F (36.4 C)-98.6 F (37 C)] 98.6 F (37 C) (03/19 4401) Pulse  Rate:  [59-75] 63 (03/19 0633) Resp:  [15-16] 16 (03/19 0633) BP: (74-138)/(33-55) 138/52 mmHg (03/19 0633) SpO2:  [96 %-97 %] 96 % (03/19 0633) Weight:  [135 lb 9.3 oz (61.5 kg)-135 lb 11.2 oz (61.553 kg)] 135 lb 9.3 oz (61.5 kg) (03/18 1712) Physical Exam: General: Elderly female resting in bed HEENT: EOMI, PERRL, MMM Cardiovascular: RR, 3/6 systolic murmur heard  Respiratory: clear to auscultation, no increased WOB  Extremities: moves all freely,  distal pulses intact Neuro: no gross deficits   Laboratory:  Recent Labs Lab 12/06/14 1150 12/08/14 0935 12/08/14 1330  WBC 8.7 9.9 9.9  HGB 10.9* 11.8* 11.1*  HCT 34.8* 35.9* 34.7*  PLT 303 262 273    Recent Labs Lab 12/06/14 1150 12/08/14 0935 12/08/14 1330  NA 136 133* 134*  K 5.3* 5.0 4.5  CL 93* 92* 91*  CO2 31 30 31   BUN 49* 62* 62*  CREATININE 4.98* 5.45* 5.58*  CALCIUM 9.7 9.6 9.3  GLUCOSE 219* 264* 222*     Imaging/Diagnostic Tests: XR Abdomen 1 View COMPARISON: Portable abdominal film of December 06, 2014  IMPRESSION: There is considerable residual barium within the colon with evidence of some distal transit since yesterday's study.  Echocardiogram 3/15 Study Conclusions  - Left ventricle: Slight intracavitary gradient. No LVOT gradeint. The cavity size was normal. Wall thickness was increased in a pattern of moderate LVH. The estimated ejection fraction was 60%. Wall motion was normal; there were no regional wall motion abnormalities. Findings consistent with left ventricular diastolic dysfunction. Doppler parameters are consistent with high ventricular filling pressure. - Mitral valve: Moderate annular calcification, but no MS. Mild MR. - Left atrium: The atrium was moderately to severely dilated. - Right atrium: The atrium was mildly dilated. - Pulmonary arteries: PA peak pressure: 39 mm Hg (S).   Elberta Leatherwood, MD 12/09/2014, 9:54 AM Seth Ward Intern pager: [phone], text pages welcome

## 2014-12-09 NOTE — Discharge Summary (Signed)
[facility] Discharge Summary  Patient name: [name] [name] Medical record number: [id number] Date of birth: 1926/01/01 Age: 81 y.o. Gender: female Date of Admission: 12/02/2014  Date of Discharge: 12/15/2014   Admitting Physician: Alveda Reasons, MD  Primary Care Provider: Ann Held, MD Consultants: Orthopedic surgery  Indication for Hospitalization: fall  Discharge Diagnoses/Problem List:  Patient Active Problem List   Diagnosis Date Noted  . Chronic kidney disease   . Constipated   . Back pain   . Fall   . Compression fracture of L5 lumbar vertebra 12/02/2014  . Compression fracture 12/02/2014  . Aspiration pneumonia 10/14/2013  . Compression fracture of L2 lumbar vertebra 10/14/2013  . Acute diastolic CHF (congestive heart failure) 10/13/2013  . Elevated troponin 10/13/2013  . Other dysphagia 10/11/2013  . Dysphagia, unspecified(787.20) 10/10/2013  . Malnutrition of moderate degree 10/10/2013  . Acute respiratory failure 10/08/2013  . Fluid overload 10/08/2013  . Hypertrophic obstructive cardiomyopathy-peak gradient 51 mmHg Jan 2015 10/05/2013  . UTI (urinary tract infection) 10/04/2013  . Hyperkalemia 10/04/2013  . Hypoglycemia 10/02/2013  . End-stage renal disease (ESRD) 10/01/2013  . Rhabdomyolysis 10/01/2013  . End stage renal disease 07/06/2012  . Murmur 05/27/2011  . OBESITY, UNSPECIFIED 03/20/2009  . ABNORMAL ELECTROCARDIOGRAM 03/20/2009  . Type 2 diabetes, uncontrolled, with renal manifestation 03/19/2009  . HYPERCHOLESTEROLEMIA 03/19/2009  . ANEMIA, CHRONIC 03/19/2009  . Essential hypertension 03/19/2009  . RENAL INSUFFICIENCY 03/19/2009   Disposition: Discharged to SNF  Discharge Condition: Stable  Discharge Exam:  General: Elderly female resting in HD bed, awake, NAD HEENT: EOMI, PERRL, MMM Cardiovascular: RR, 3/6 systolic murmur heard  Respiratory: clear to auscultation, no increased WOB  Abdomen: S/NT/ND,  hypoactive BS Extremities: moves all freely, distal pulses intact; point tenderness to the lumbar spine (L5) -- improved. Denies radiation at this time. Neuro: no gross deficits   Brief Hospital Course:  Patient is a 81 year old female who presented after a fall. She states that she had gotten up early the morning of admission to go to the restroom at that time she states that she fell on her buttock. Immediately following that fall she had significant amount of pain in her lumbar back. Pain was a 9/10, sharp in nature, without radiation. Pain was exacerbated with any movement. She denied any weakness or sensation loss. She denied any syncopal or presyncopal episodes. She denied any cough congestion abdominal pain shortness of breath or chest pain. Patient has ESRD and gets regular hemodialysis. Patient stated that she did not go to her hemodialysis appointment the day prior to this event.  Orthopedic surgery was consulted after admission. They asked for lumbar films to be ordered. He was felt at that time that patient would benefit from vertebroplasty. On 3/15 orthopedics ordered a MRI of her lumbar spine in order to evaluate for this procedure. At that time it was noticed the patient had a significant amount of retained barium within her colon. Efforts were made to remove this with stool softeners and laxatives. Eventually enemas were used. This retained barium continue to pose a problem with radiographical imaging of the affected vertebra and the possibility for any procedure to be performed. On 3/19 patient was finally given a bowel prep. On 3/20 she experienced her first bowel movement which was then followed by 2-3 additional movements within the subsequent 24 hours. Interventional radiology had agreed to perform the vertebroplasty as soon as possible barium had been cleared from her colon as well as insurance clearance was  obtained. Clearance for procedure obtained on 3/24 -- procedure performed on  3/24. Patient experienced immediate pain relief. Pain still present but tolerable. Patient cleared by primary team and IR for DC on 3/25, patient was then [place] to SNF.  Issues for Follow Up:  1. Normal post-operative care 2. Pain control so pt can remain upright for HD  Significant Procedures: Vertebroplasty  Significant Labs and Imaging:   Recent Labs Lab 12/11/14 0730  WBC 12.0*  HGB 11.4*  HCT 35.4*  PLT 240    Recent Labs Lab 12/11/14 0643 12/12/14 0728 12/13/14 0553 12/14/14 0553 12/15/14 0830  NA 133* 137 134* 137 132*  K 4.4 3.6 4.1 3.5 3.4*  CL 93* 97 96 99 95*  CO2 30 31 31 30 26   GLUCOSE 71 141* 162* 78 403*  BUN 47* 20 30* 16 33*  CREATININE 5.68* 3.71* 4.69* 3.20* 4.52*  CALCIUM 9.2 9.1 9.1 9.2 8.9  PHOS 5.9* 5.1* 5.7* 3.9 5.3*  ALBUMIN 2.4* 2.5* 2.3* 2.4* 2.4*   KUB 3/21 IMPRESSION: Moderate amount of residual barium within the colon with mild decrease compared with 12/07/2014.  XR Abdomen 1 View COMPARISON: Portable abdominal film of December 06, 2014  IMPRESSION: There is considerable residual barium within the colon with evidence of some distal transit since yesterday's study.  Echocardiogram 3/15 Study Conclusions  - Left ventricle: Slight intracavitary gradient. No LVOT gradeint. The cavity size was normal. Wall thickness was increased in a pattern of moderate LVH. The estimated ejection fraction was 60%. Wall motion was normal; there were no regional wall motion abnormalities. Findings consistent with left ventricular diastolic dysfunction. Doppler parameters are consistent with high ventricular filling pressure. - Mitral valve: Moderate annular calcification, but no MS. Mild MR. - Left atrium: The atrium was moderately to severely dilated. - Right atrium: The atrium was mildly dilated. - Pulmonary arteries: PA peak pressure: 39 mm Hg (S).  Results/Tests Pending at Time of Discharge: none  Discharge Medications:     Medication List    TAKE these medications        acetaminophen 500 MG tablet  Commonly known as:  TYLENOL  Take 1,000 mg by mouth every 6 (six) hours as needed (pain).     amitriptyline 10 MG tablet  Commonly known as:  ELAVIL  Take 10 mg by mouth at bedtime.     aspirin EC 81 MG tablet  Take 81 mg by mouth daily.     atorvastatin 20 MG tablet  Commonly known as:  LIPITOR  Take 1 tablet (20 mg total) by mouth daily.     b complex-vitamin c-folic acid 0.8 MG Tabs tablet  Take 1 tablet by mouth at bedtime.     calcitRIOL 0.25 MCG capsule  Commonly known as:  ROCALTROL  Take 1 capsule (0.25 mcg total) by mouth daily.     cyanocobalamin 1000 MCG/ML injection  Commonly known as:  (VITAMIN B-12)  Inject 1,000 mcg into the muscle every 30 (thirty) days.     diazepam 5 MG tablet  Commonly known as:  VALIUM  Take 1 tablet (5 mg total) by mouth every 12 (twelve) hours as needed for anxiety.     febuxostat 40 MG tablet  Commonly known as:  ULORIC  Take 40 mg by mouth at bedtime.     feeding supplement (GLUCERNA SHAKE) Liqd  Take 237 mLs by mouth 2 (two) times daily between meals.     fexofenadine 180 MG tablet  Commonly known as:  ALLEGRA  Take 180 mg by mouth daily as needed for allergies or rhinitis.     fish oil-omega-3 fatty acids 1000 MG capsule  Take 1 g by mouth daily.     Glucosamine-Chondroitin 250-200 MG Caps  Take 1 capsule by mouth daily.     insulin detemir 100 UNIT/ML injection  Commonly known as:  LEVEMIR  Inject 10 Units into the skin daily.     insulin regular 100 units/mL injection  Commonly known as:  NOVOLIN R,HUMULIN R  Inject 0-12 Units into the skin 3 (three) times daily before meals. Per sliding scale: 0-150=0 units, 151-200 = 2 units, 201-250 = 4 units, 251-300 = 6 units, 301-350 = 8 units, 351-400 = 10 units, 401-450 = 12 units, > 450 CALL MD.     lansoprazole 30 MG capsule  Commonly known as:  PREVACID  Take 30 mg by mouth daily at 12  noon.     lanthanum 500 MG chewable tablet  Commonly known as:  FOSRENOL  Chew 1,000 mg by mouth 3 (three) times daily with meals.     metoprolol succinate 25 MG 24 hr tablet  Commonly known as:  TOPROL XL  Take 0.5 tablets (12.5 mg total) by mouth daily.     nystatin 100000 UNIT/GM Powd  Apply 1 g topically as needed (affected area).     OxyCODONE HCl (Abuse Deter) 5 MG Taba  Take 5 mg by mouth every 4 (four) hours as needed (pain).     phenol 1.4 % Liqd  Commonly known as:  CHLORASEPTIC  Use as directed 1 spray in the mouth or throat 3 (three) times daily as needed for throat irritation / pain.     polyethylene glycol packet  Commonly known as:  MIRALAX / GLYCOLAX  Take 17 g by mouth at bedtime.     potassium chloride SA 20 MEQ tablet  Commonly known as:  K-DUR,KLOR-CON  Take 20 mEq by mouth daily.     sodium bicarbonate 650 MG tablet  Take 1 tablet (650 mg total) by mouth daily.     traMADol 50 MG tablet  Commonly known as:  ULTRAM  Take 1 tablet (50 mg total) by mouth every 6 (six) hours as needed (pain).        Discharge Instructions: Please refer to Patient Instructions section of EMR for full details.  Patient was counseled important signs and symptoms that should prompt return to medical care, changes in medications, dietary instructions, activity restrictions, and follow up appointments.   Follow-Up Appointments: Follow-up Information    Follow up with GIOFFRE,RONALD A, MD. Schedule an appointment as soon as possible for a visit in 3 weeks.   Specialty:  Orthopedic Surgery   Contact information:   [address] [place] [address] [phone]       Follow up with Ann Held, MD.   Specialty:  [facility]   Contact information:   [place] [phone] Vale Bonner Puna, MD, PGY-2 12/15/2014 2:40 PM

## 2014-12-09 NOTE — Progress Notes (Signed)
Subjective:  Eating brk laying in bed on her side / continued  back pain, tol hd yest.  Objective Vital signs in last 24 hours: Filed Vitals:   12/08/14 1635 12/08/14 1712 12/08/14 2313 12/09/14 0633  BP: 106/45 126/51 122/44 138/52  Pulse: 65 69 75 63  Temp:  97.8 F (36.6 C) 98.6 F (37 C) 98.6 F (37 C)  TempSrc:   Oral Oral  Resp:  16 16 16   Height:      Weight:  61.5 kg (135 lb 9.3 oz)    SpO2:  97% 96% 96%   Weight change: -1.134 kg (-2 lb 8 oz) Physical Exam: General appearance: Alert NAD   Resp:CTA  Cardio: occas irreg beat, VR stable, no murmur or rub GI: + BS, soft and nontender Extremities: No pedal edema  Access: AVF @ RUA with + bruit  HD: MWF [facility] 3h 46min 65 kg F160 400/A1.5 2/2.0 Bath Heparin 2000 RUA AVF Venofer 50/wk, no other meds  Problem/Plan 1. Acute L5 compression fracture - sec to fall 3/12./ Vertebroplast per IR when INSURANCE oks, Ortho following 2. Pulmonary edema / hypoxemia - moderate CHF per CXR 3/12,now 96% % o2 sat with [place] O2/ hd uf with bed wt yest 61.5 kg   3. ESRD - HD on MWF @ [facility]. on sch. Next hd mon . 4. HTN/Volume - BP stableon Metoprolol 6.25 bid ; Below prior edw/  lower EDW at dc 5. Anemia - Hgb 11.1 Venofer qwk. No esa fu hgbs  6. Sec HPT - Ca 9.3 (10.4 corrected), P 4.5 Fosrenol 1 g with meals./ no vit d / use low ca hd bath 7. Nutrition - Alb 2.4.>2.6>2.6/ renal carb diet/ renal vit/  breeze supplement 8. Hx L2 compression fx - s/p kyphoplasty 09/2013. 9. DM 2 - insulin per primary 10. Constipation = enema given last pm  Little results/ miralax / prn sorbitol Ortho rx  [name], PA-C [facility] [phone] 12/09/2014,8:30 AM  LOS: 7 days   Labs: Basic Metabolic Panel:  Recent Labs Lab 12/04/14 0714 12/06/14 1150 12/08/14 0935 12/08/14 1330  NA 138 136 133* 134*  K 5.1 5.3* 5.0 4.5  CL 99 93* 92* 91*  CO2 25 31 30 31   GLUCOSE 206* 219* 264* 222*  BUN 33*  49* 62* 62*  CREATININE 4.23* 4.98* 5.45* 5.58*  CALCIUM 9.3 9.7 9.6 9.3  PHOS 4.4 5.2*  --  4.5   Liver Function Tests:  Recent Labs Lab 12/04/14 0714 12/06/14 1150 12/08/14 1330  ALBUMIN 2.4* 2.6* 2.6*  CBC:  Recent Labs Lab 12/02/14 1159 12/03/14 0030 12/04/14 0716 12/06/14 1150 12/08/14 0935 12/08/14 1330  WBC 11.9* 13.2* 8.9 8.7 9.9 9.9  NEUTROABS 9.9*  --   --   --   --   --   HGB 11.3* 13.1 10.8* 10.9* 11.8* 11.1*  HCT 34.4* 39.3 33.1* 34.8* 35.9* 34.7*  MCV 93.7 93.8 94.6 96.9 95.5 95.9  PLT 248 307 274 303 262 273   Cardiac Enzymes:  Recent Labs Lab 12/02/14 1159 12/02/14 2000 12/03/14 0030 12/03/14 0704  TROPONINI 0.56* 0.69* 0.83* 0.56*   CBG:  Recent Labs Lab 12/07/14 2041 12/08/14 0757 12/08/14 1128 12/08/14 1749 12/08/14 2344  GLUCAP 288* 252* 253* 132* 232*   Medications:   . amitriptyline  10 mg Oral QHS  . aspirin  81 mg Oral Daily  . atorvastatin  20 mg Oral q1800  . calcitonin (salmon)  1 spray Alternating Nares Daily  .  febuxostat  40 mg Oral QHS  . feeding supplement (RESOURCE BREEZE)  1 Container Oral TID BM  . heparin  5,000 Units Subcutaneous 3 times per day  . insulin aspart  0-15 Units Subcutaneous TID WC  . insulin detemir  10 Units Subcutaneous QHS  . lanthanum  1,000 mg Oral TID WC  . loratadine  10 mg Oral Daily  . metoprolol tartrate  6.25 mg Oral BID  . multivitamin  1 tablet Oral QHS  . pantoprazole sodium  40 mg Oral QHS  . polyethylene glycol  17 g Oral Daily  . senna-docusate  1 tablet Oral BID  . sodium chloride  3 mL Intravenous Q12H      I have seen and examined this patient and agree with the plan of care. Just very pitiful appearing in bed but actually looks much better when she's in a chair. I hope we can get the kyphoplasty done early next week. IR was still working on Biochemist, clinical.  [name], MD 12/09/2014, 1:14 PM

## 2014-12-09 NOTE — Progress Notes (Signed)
Retention enema removed ,no stool  Present,will medicate with sorbitol.

## 2014-12-10 LAB — GLUCOSE, CAPILLARY
GLUCOSE-CAPILLARY: 103 mg/dL — AB (ref 70–99)
Glucose-Capillary: 166 mg/dL — ABNORMAL HIGH (ref 70–99)
Glucose-Capillary: 191 mg/dL — ABNORMAL HIGH (ref 70–99)
Glucose-Capillary: 193 mg/dL — ABNORMAL HIGH (ref 70–99)

## 2014-12-10 NOTE — Progress Notes (Signed)
Subjective:  No change in pain  awaiting Insur / IR procedure / HD for mon / for op hd will need to be up in [place] for hd ( not able  to even attempt until IR procedure)  Objective Vital signs in last 24 hours: Filed Vitals:   12/09/14 1048 12/09/14 1836 12/09/14 2000 12/10/14 0615  BP: 131/45 114/85 121/34 128/35  Pulse: 75 62 63 53  Temp: 98.5 F (36.9 C) 97.9 F (36.6 C) 98.2 F (36.8 C) 97.8 F (36.6 C)  TempSrc: Oral Oral    Resp: 16 18 15 18   Height:      Weight:   62.143 kg (137 lb)   SpO2: 96% 96% 97% 99%   Weight change: 0.59 kg (1 lb 4.8 oz) Physical Exam: General appearance: Alert  Laying on side back pain decr with pain med  Resp:CTA  Cardio: RRR currently , no murmur or rub GI: + BS, soft and nontender Extremities: No pedal edema  Access: AVF @ RUA with + bruit  HD: MWF [facility] 3h 34min 65 kg F160 400/A1.5 2/2.0 Bath Heparin 2000 RUA AVF Venofer 50/wk, no other meds  Problem/Plan 1. Acute L5 compression fracture - sec to fall 3/12./ Vertebroplast per IR when INSURANCE oks, Ortho following 2. Pulmonary edema / hypoxemia - moderate CHF per CXR 3/12,now 99% % o2 sat with [place] O2/ hd uf with bed wt  Fri  61.5 kg  ( old edw 65kg) 3. ESRD - HD on MWF @ [facility]. on sch. Next hd mon . 4. HTN/Volume - BP stableon Metoprolol 6.25 bid ; Below prior OP  edw/  lower EDW at dc 5. Anemia - Hgb 11.1 Venofer qwk. No esa fu hgbs  6. Sec HPT - Ca 9.3 (10.4 corrected), P 4.5 Fosrenol 1 g with meals./ no vit d / use low ca hd bath 7. Nutrition - Alb 2.4.>2.6>2.6/ renal carb diet/ renal vit/ breeze supplement 8. Hx L2 compression fx - s/p kyphoplasty 09/2013. 9. DM 2 - insulin per primary 10. Constipation = Admit team  RX    [name], PA-C [facility] [phone] 12/10/2014,8:04 AM  LOS: 8 days  I have seen and examined this patient and agree with the plan of care. Still waiting for insurance approval for the vertebroplasty.  Hopefully we can get this taken care of early next week. We need to get her out of bed more frequently and walking. On for HD in the AM. No acute indication for dialysis today.  [name], MD 12/10/2014, 11:34 AM  Labs: Basic Metabolic Panel:  Recent Labs Lab 12/04/14 0714 12/06/14 1150 12/08/14 0935 12/08/14 1330  NA 138 136 133* 134*  K 5.1 5.3* 5.0 4.5  CL 99 93* 92* 91*  CO2 25 31 30 31   GLUCOSE 206* 219* 264* 222*  BUN 33* 49* 62* 62*  CREATININE 4.23* 4.98* 5.45* 5.58*  CALCIUM 9.3 9.7 9.6 9.3  PHOS 4.4 5.2*  --  4.5   Liver Function Tests:  Recent Labs Lab 12/04/14 0714 12/06/14 1150 12/08/14 1330  ALBUMIN 2.4* 2.6* 2.6*   No results for input(s): LIPASE, AMYLASE in the last 168 hours. No results for input(s): AMMONIA in the last 168 hours. CBC:  Recent Labs Lab 12/04/14 0716 12/06/14 1150 12/08/14 0935 12/08/14 1330  WBC 8.9 8.7 9.9 9.9  HGB 10.8* 10.9* 11.8* 11.1*  HCT 33.1* 34.8* 35.9* 34.7*  MCV 94.6 96.9 95.5 95.9  PLT 274 303 262 273   Cardiac  Enzymes: No results for input(s): CKTOTAL, CKMB, CKMBINDEX, TROPONINI in the last 168 hours. CBG:  Recent Labs Lab 12/08/14 2344 12/09/14 0811 12/09/14 1213 12/09/14 1708 12/09/14 1959  GLUCAP 232* 251* 158* 207* 229*    Studies/Results: No results found. Medications:   . amitriptyline  10 mg Oral QHS  . aspirin  81 mg Oral Daily  . atorvastatin  20 mg Oral q1800  . calcitonin (salmon)  1 spray Alternating Nares Daily  . febuxostat  40 mg Oral QHS  . feeding supplement (RESOURCE BREEZE)  1 Container Oral TID BM  . heparin  5,000 Units Subcutaneous 3 times per day  . insulin aspart  0-15 Units Subcutaneous TID WC  . insulin detemir  10 Units Subcutaneous QHS  . lanthanum  1,000 mg Oral TID WC  . loratadine  10 mg Oral Daily  . metoprolol tartrate  6.25 mg Oral BID  . multivitamin  1 tablet Oral QHS  . pantoprazole sodium  40 mg Oral QHS  . senna-docusate  1 tablet Oral BID  . sodium  chloride  3 mL Intravenous Q12H

## 2014-12-10 NOTE — Progress Notes (Signed)
Family Medicine Teaching Service Daily Progress Note Intern Pager: [phone]  Patient name: [name] [name] Medical record number: [id number] Date of birth: 06-Nov-1925 Age: 81 y.o. Gender: female  Primary Care Provider: Ann Held, MD Consultants: ortho, cards, IR, Nephro  Code Status: FULL   Pt Overview and Major Events to Date:  3/12: admitted for fall, found to have an L5 compression fx  3/13: lumbar films ordered  3/15: MRI lumbar spine to eval for vertebroplasty; per ortho 3/16: Patient had dialysis in AM, barium in colon. SS enema ineffective 3/17: Patient started on Senna S BID 3/18: Milk and molasses enema  3/19: "Bowel Prep"  Assessment and Plan: [name] [name] is a 81 y.o. female presenting with fall. PMH is significant for Hx L2 fracture w/ previous kypophlasty, ESRD on HD since 2015, mild non-obstructive CAD on cath in 2008 with evidence of HOCM, DM2, HTN, osteoporosis, GERD and dCHF.   # Fall/L5 compression fx: still in pain. Pt noted to have a large amount of barium in her bowel, given SS enema to evacuate which was ineffective. Start Senna S BID.  - fentanyl 12.5 mcg IV q4 PRN for severe pain - tramadol for moderate pain - tylenol for mild pain - Ortho consulted, Lumbar MRI obtained. Will have for IR vertebroplasty when approved and barium cleared - Continue Calcitonin nasally  - Milk and molasses enema ordered on 3/18 to induce BM >> "very small" BM this AM  - Bowel prep ordered to induce BM >> will be followed by KUB once it occurs - SW made aware, consulted for possible SNF placement which PT has recommended   # Elevated Troponin/Demand ischemia: hx of having troponins elevated. No reports of SOB or chest pain.  - troponins had trended down, no further testing at this time   # Anemia of Chronic disease/MGUS: Has been seen by Dr. Marin Olp. Baseline hemoglobin 11.8 hematocrit 36.6. Blood studies showed a monoclonal spike of 0.18 g/dL. On a serum  immunofixation, a IgG Kappa protein was noted. However, she had normal values of her immunoglobulins. - Monitor BMET   # Acute on chronic dCHF  - ECHO 60% EF, grade 1 diastolic dysfunction  # HOCM,HTN, HLD, CAD - metoprolol 6.25 mg daily  - continue lipitor   # DM2: controlled. Hgb A1c 6.4 (11/08/13)  - Moderate SSI and back on home dose Levemir (10u QD)  # ESRD on HD: secondary to DM2. Dr. Posey Pronto is her nephrologist  - renal consulted  - Last HD 3/18   # Chronic dysphagia: patient with history of and a barium swallow study performed last year which showed esophageal dysmotility. EGD unremarkable  - protonix   FEN/GI: Renal diet, change to NPO when procedure approved, SLIV  PPx: heparin subQ   Disposition: SNF pending surgery   Subjective:  Doing well other than currently endorsing 6/10 back pain.  Still no BM despite enema   Objective: Temp:  [97.8 F (36.6 C)-98.5 F (36.9 C)] 97.8 F (36.6 C) (03/20 0615) Pulse Rate:  [53-75] 53 (03/20 0615) Resp:  [15-18] 18 (03/20 0615) BP: (114-131)/(34-85) 128/35 mmHg (03/20 0615) SpO2:  [96 %-99 %] 99 % (03/20 0615) Weight:  [137 lb (62.143 kg)] 137 lb (62.143 kg) (03/19 2000) Physical Exam: General: Elderly female resting in bed HEENT: EOMI, PERRL, MMM Cardiovascular: RR, 3/6 systolic murmur heard  Respiratory: clear to auscultation, no increased WOB  Abdomen: S/NT/ND, hypoactive BS Extremities: moves all freely, distal pulses intact Neuro: no gross deficits  Laboratory:  Recent Labs Lab 12/06/14 1150 12/08/14 0935 12/08/14 1330  WBC 8.7 9.9 9.9  HGB 10.9* 11.8* 11.1*  HCT 34.8* 35.9* 34.7*  PLT 303 262 273    Recent Labs Lab 12/06/14 1150 12/08/14 0935 12/08/14 1330  NA 136 133* 134*  K 5.3* 5.0 4.5  CL 93* 92* 91*  CO2 31 30 31   BUN 49* 62* 62*  CREATININE 4.98* 5.45* 5.58*  CALCIUM 9.7 9.6 9.3  GLUCOSE 219* 264* 222*     Imaging/Diagnostic Tests: XR Abdomen 1 View COMPARISON:  Portable abdominal film of December 06, 2014  IMPRESSION: There is considerable residual barium within the colon with evidence of some distal transit since yesterday's study.  Echocardiogram 3/15 Study Conclusions  - Left ventricle: Slight intracavitary gradient. No LVOT gradeint. The cavity size was normal. Wall thickness was increased in a pattern of moderate LVH. The estimated ejection fraction was 60%. Wall motion was normal; there were no regional wall motion abnormalities. Findings consistent with left ventricular diastolic dysfunction. Doppler parameters are consistent with high ventricular filling pressure. - Mitral valve: Moderate annular calcification, but no MS. Mild MR. - Left atrium: The atrium was moderately to severely dilated. - Right atrium: The atrium was mildly dilated. - Pulmonary arteries: PA peak pressure: 39 mm Hg (S).   Nolon Rod, DO 12/10/2014, 8:46 AM [place] Intern pager: [phone], text pages welcome

## 2014-12-11 ENCOUNTER — Inpatient Hospital Stay (HOSPITAL_COMMUNITY): Payer: Medicare Other

## 2014-12-11 DIAGNOSIS — T148 Other injury of unspecified body region: Secondary | ICD-10-CM

## 2014-12-11 DIAGNOSIS — M5489 Other dorsalgia: Secondary | ICD-10-CM

## 2014-12-11 DIAGNOSIS — M549 Dorsalgia, unspecified: Secondary | ICD-10-CM | POA: Insufficient documentation

## 2014-12-11 LAB — CBC WITH DIFFERENTIAL/PLATELET
Basophils Absolute: 0.1 10*3/uL (ref 0.0–0.1)
Basophils Relative: 0 % (ref 0–1)
Eosinophils Absolute: 0.4 10*3/uL (ref 0.0–0.7)
Eosinophils Relative: 4 % (ref 0–5)
HCT: 35.4 % — ABNORMAL LOW (ref 36.0–46.0)
Hemoglobin: 11.4 g/dL — ABNORMAL LOW (ref 12.0–15.0)
Lymphocytes Relative: 25 % (ref 12–46)
Lymphs Abs: 3 10*3/uL (ref 0.7–4.0)
MCH: 30.6 pg (ref 26.0–34.0)
MCHC: 32.2 g/dL (ref 30.0–36.0)
MCV: 95.2 fL (ref 78.0–100.0)
Monocytes Absolute: 1.1 10*3/uL — ABNORMAL HIGH (ref 0.1–1.0)
Monocytes Relative: 9 % (ref 3–12)
Neutro Abs: 7.5 10*3/uL (ref 1.7–7.7)
Neutrophils Relative %: 62 % (ref 43–77)
Platelets: 240 10*3/uL (ref 150–400)
RBC: 3.72 MIL/uL — ABNORMAL LOW (ref 3.87–5.11)
RDW: 14.4 % (ref 11.5–15.5)
WBC: 12 10*3/uL — ABNORMAL HIGH (ref 4.0–10.5)

## 2014-12-11 LAB — GLUCOSE, CAPILLARY
Glucose-Capillary: 79 mg/dL (ref 70–99)
Glucose-Capillary: 117 mg/dL — ABNORMAL HIGH (ref 70–99)
Glucose-Capillary: 258 mg/dL — ABNORMAL HIGH (ref 70–99)

## 2014-12-11 LAB — RENAL FUNCTION PANEL
Albumin: 2.4 g/dL — ABNORMAL LOW (ref 3.5–5.2)
Anion gap: 10 (ref 5–15)
BUN: 47 mg/dL — ABNORMAL HIGH (ref 6–23)
CO2: 30 mmol/L (ref 19–32)
Calcium: 9.2 mg/dL (ref 8.4–10.5)
Chloride: 93 mmol/L — ABNORMAL LOW (ref 96–112)
Creatinine, Ser: 5.68 mg/dL — ABNORMAL HIGH (ref 0.50–1.10)
GFR calc Af Amer: 7 mL/min — ABNORMAL LOW (ref >90)
GFR calc non Af Amer: 6 mL/min — ABNORMAL LOW (ref >90)
Glucose, Bld: 71 mg/dL (ref 70–99)
Phosphorus: 5.9 mg/dL — ABNORMAL HIGH (ref 2.3–4.6)
Potassium: 4.4 mmol/L (ref 3.5–5.1)
Sodium: 133 mmol/L — ABNORMAL LOW (ref 135–145)

## 2014-12-11 NOTE — Progress Notes (Signed)
Family Medicine Teaching Service Daily Progress Note Intern Pager: [phone]  Patient name: [name] [name] Medical record number: [id number] Date of birth: 12-08-1925 Age: 81 y.o. Gender: female  Primary Care Provider: Ann Held, MD Consultants: ortho, cards, IR, Nephro  Code Status: FULL   Pt Overview and Major Events to Date:  3/12: admitted for fall, found to have an L5 compression fx  3/13: lumbar films ordered  3/15: MRI lumbar spine to eval for vertebroplasty; per ortho 3/16: Patient had dialysis in AM, barium in colon. SS enema ineffective 3/17: Patient started on Senna S BID 3/18: Milk and molasses enema  3/19: "Bowel Prep"  Assessment and Plan: [name] [name] is a 81 y.o. female presenting with fall. PMH is significant for Hx L2 fracture w/ previous kypophlasty, ESRD on HD since 2015, mild non-obstructive CAD on cath in 2008 with evidence of HOCM, DM2, HTN, osteoporosis, GERD and dCHF.   # Fall/L5 compression fx: still in pain. Pt noted to have a large amount of barium in her bowel, given SS enema to evacuate which was ineffective. Start Senna S BID.  - fentanyl 12.5 mcg IV q4 PRN for severe pain - tramadol for moderate pain - tylenol for mild pain - Ortho consulted, Lumbar MRI obtained. Will have for IR vertebroplasty when approved and barium cleared - Continue Calcitonin nasally  - Large BM 3/20 >> KUB today 3/21 - SW made aware, consulted for possible SNF placement which PT has recommended   # Elevated Troponin/Demand ischemia: hx of having troponins elevated. No reports of SOB or chest pain.  - troponins had trended down, no further testing at this time   # Anemia of Chronic disease/MGUS: Has been seen by Dr. Marin Olp. Baseline hemoglobin 11.8 hematocrit 36.6. Blood studies showed a monoclonal spike of 0.18 g/dL. On a serum immunofixation, a IgG Kappa protein was noted. However, she had normal values of her immunoglobulins. - Monitor BMET   # Acute on  chronic dCHF  - ECHO 60% EF, grade 1 diastolic dysfunction  # HOCM,HTN, HLD, CAD - metoprolol 6.25 mg daily  - continue lipitor   # DM2: controlled. Hgb A1c 6.4 (11/08/13)  - Moderate SSI and back on home dose Levemir (10u QD)  # ESRD on HD: secondary to DM2. Dr. Posey Pronto is her nephrologist  - renal consulted  - HD 3/21  # Chronic dysphagia: patient with history of and a barium swallow study performed last year which showed esophageal dysmotility. EGD unremarkable  - protonix   FEN/GI: Renal diet, change to NPO when procedure approved, SLIV  PPx: heparin subQ   Disposition: SNF pending surgery   Subjective:  Currently in HD. Endorses continued/unchanged back pain. Had large BM yesterday -- will repeat KUB today to check if barium is still present.  Objective: Temp:  [97.5 F (36.4 C)-98.2 F (36.8 C)] 97.6 F (36.4 C) (03/21 1124) Pulse Rate:  [51-84] 64 (03/21 1124) Resp:  [16-18] 16 (03/21 1124) BP: (68-132)/(30-59) 119/39 mmHg (03/21 1124) SpO2:  [97 %-99 %] 98 % (03/21 1124) Weight:  [140 lb 14 oz (63.9 kg)] 140 lb 14 oz (63.9 kg) (03/21 0730) Physical Exam: General: Elderly female resting in bed HEENT: EOMI, PERRL, MMM Cardiovascular: RR, 3/6 systolic murmur heard  Respiratory: clear to auscultation, no increased WOB  Abdomen: S/NT/ND, hypoactive BS Extremities: moves all freely, distal pulses intact Neuro: no gross deficits   Laboratory:  Recent Labs Lab 12/08/14 0935 12/08/14 1330 12/11/14 0730  WBC 9.9 9.9 12.0*  HGB 11.8* 11.1* 11.4*  HCT 35.9* 34.7* 35.4*  PLT 262 273 240    Recent Labs Lab 12/08/14 0935 12/08/14 1330 12/11/14 0643  NA 133* 134* 133*  K 5.0 4.5 4.4  CL 92* 91* 93*  CO2 30 31 30   BUN 62* 62* 47*  CREATININE 5.45* 5.58* 5.68*  CALCIUM 9.6 9.3 9.2  GLUCOSE 264* 222* 71     Imaging/Diagnostic Tests: XR Abdomen 1 View COMPARISON: Portable abdominal film of December 06, 2014  IMPRESSION: There is  considerable residual barium within the colon with evidence of some distal transit since yesterday's study.  Echocardiogram 3/15 Study Conclusions  - Left ventricle: Slight intracavitary gradient. No LVOT gradeint. The cavity size was normal. Wall thickness was increased in a pattern of moderate LVH. The estimated ejection fraction was 60%. Wall motion was normal; there were no regional wall motion abnormalities. Findings consistent with left ventricular diastolic dysfunction. Doppler parameters are consistent with high ventricular filling pressure. - Mitral valve: Moderate annular calcification, but no MS. Mild MR. - Left atrium: The atrium was moderately to severely dilated. - Right atrium: The atrium was mildly dilated. - Pulmonary arteries: PA peak pressure: 39 mm Hg (S).   Elberta Leatherwood, MD 12/11/2014, 12:03 PM [place] Intern pager: [phone], text pages welcome

## 2014-12-11 NOTE — Progress Notes (Signed)
Subjective:   Cont to have back pain. Medication helps. No complaints  Objective Filed Vitals:   12/11/14 0930 12/11/14 1000 12/11/14 1030 12/11/14 1100  BP: 115/39 119/44 122/39 113/35  Pulse: 56 60 62 64  Temp:      TempSrc:      Resp: 16 16    Height:      Weight:      SpO2:       Physical Exam General: alert and oriented, no acute distress.  Heart: RRR 2/6 systolic murmur  Lungs:  CTA, unlabored  Abdomen: soft, nontender +BS  Extremities: no edema Dialysis Access:  R AVF patent on HD   HD: MWF [facility] 3h 56min 65 kg F160 400/A1.5 2/2.0 Bath Heparin 2000 RUA AVF Venofer 50/wk, no other meds  Problem/Plan 1. Acute L5 compression fracture - sec to fall 3/12./ Vertebroplast per IR when INSURANCE oks, Ortho following 2. Pulmonary edema / hypoxemia - moderate CHF per CXR 3/12,under edw per bed wts, no sob 3. ESRD - HD on MWF @ [facility].HD today K+ 4.4 4. HTN/Volume - 119/44 on Metoprolol 6.25 bid ; Below prior edw/ lower EDW at dc 5. Anemia - Hgb 11.4 Venofer qwk. No esa  6. Sec HPT - Ca 9.2(10.5 corrected), P 5.9Fosrenol 1 g with meals./ no vit d / use low ca hd bath 7. Nutrition - Alb 2.6/ renal carb diet/ renal vit/ breeze supplement 8. Hx L2 compression fx - s/p kyphoplasty 09/2013. 9. DM 2 - insulin per primary 10. Constipation = per primary 11. dispo- likely to SNF  Shelle Iron, NP [place] [phone] 12/11/2014,11:09 AM  LOS: 9 days   Pt seen, examined and agree w A/P as above.  Kelly Splinter MD pager [phone]    cell [phone] 12/11/2014, 1:04 PM    Additional Objective Labs: Basic Metabolic Panel:  Recent Labs Lab 12/06/14 1150 12/08/14 0935 12/08/14 1330 12/11/14 0643  NA 136 133* 134* 133*  K 5.3* 5.0 4.5 4.4  CL 93* 92* 91* 93*  CO2 31 30 31 30   GLUCOSE 219* 264* 222* 71  BUN 49* 62* 62* 47*  CREATININE 4.98* 5.45* 5.58* 5.68*  CALCIUM 9.7 9.6 9.3 9.2  PHOS 5.2*  --  4.5 5.9*   Liver Function  Tests:  Recent Labs Lab 12/06/14 1150 12/08/14 1330 12/11/14 0643  ALBUMIN 2.6* 2.6* 2.4*   No results for input(s): LIPASE, AMYLASE in the last 168 hours. CBC:  Recent Labs Lab 12/06/14 1150 12/08/14 0935 12/08/14 1330 12/11/14 0730  WBC 8.7 9.9 9.9 12.0*  NEUTROABS  --   --   --  7.5  HGB 10.9* 11.8* 11.1* 11.4*  HCT 34.8* 35.9* 34.7* 35.4*  MCV 96.9 95.5 95.9 95.2  PLT 303 262 273 240   Blood Culture    Component Value Date/Time   SDES URINE, CATHETERIZED 12/02/2014 1528   SPECREQUEST NONE 12/02/2014 1528   CULT  12/02/2014 1528    LACTOBACILLUS SPECIES Note: Standardized susceptibility testing for this organism is not available. Performed at [facility] 12/05/2014 FINAL 12/02/2014 1528    Cardiac Enzymes: No results for input(s): CKTOTAL, CKMB, CKMBINDEX, TROPONINI in the last 168 hours. CBG:  Recent Labs Lab 12/09/14 1959 12/10/14 0757 12/10/14 1146 12/10/14 1632 12/10/14 2233  GLUCAP 229* 103* 166* 191* 193*   Iron Studies: No results for input(s): IRON, TIBC, TRANSFERRIN, FERRITIN in the last 72 hours. @lablastinr3 @ Studies/Results: No results found. Medications:   . amitriptyline  10 mg Oral QHS  . aspirin  81 mg Oral Daily  . atorvastatin  20 mg Oral q1800  . calcitonin (salmon)  1 spray Alternating Nares Daily  . febuxostat  40 mg Oral QHS  . feeding supplement (RESOURCE BREEZE)  1 Container Oral TID BM  . heparin  5,000 Units Subcutaneous 3 times per day  . insulin aspart  0-15 Units Subcutaneous TID WC  . insulin detemir  10 Units Subcutaneous QHS  . lanthanum  1,000 mg Oral TID WC  . loratadine  10 mg Oral Daily  . metoprolol tartrate  6.25 mg Oral BID  . multivitamin  1 tablet Oral QHS  . pantoprazole sodium  40 mg Oral QHS  . senna-docusate  1 tablet Oral BID  . sodium chloride  3 mL Intravenous Q12H

## 2014-12-11 NOTE — Care Management Note (Signed)
CARE MANAGEMENT NOTE 12/11/2014  Patient:  [name] [name], [name] [name]   Account Number:  [id number]  Date Initiated:  12/04/2014  Documentation initiated by:  Timohty Renbarger  Subjective/Objective Assessment:   CM following for progression and d/c planning.     Action/Plan:   Pt is ESRD receiving outpatient HD, following closely for PT eval and ability of pt to travel to outpt HD.  12/11/14 Cont to follow.   Anticipated DC Date:  12/16/2014   Anticipated DC Plan:  SKILLED NURSING FACILITY         Choice offered to / List presented to:             Status of service:   Medicare Important Message given?  YES (If response is "NO", the following Medicare IM given date fields will be blank) Date Medicare IM given:  12/04/2014 Medicare IM given by:  Margrett Kalb Date Additional Medicare IM given:  12/11/2014 Additional Medicare IM given by:  [facility]  Discharge Disposition:    Per UR Regulation:    If discussed at Long Length of Stay Meetings, dates discussed:    Comments:  12/11/2014 Contining to follow, noted ongoing efforts to clear barium from bowel and await insurance approval . Pt unable to sit > 1.5 hr. CRoyal RN MPH case manager, [phone]

## 2014-12-11 NOTE — Progress Notes (Signed)
PT Cancellation Note  Patient Details Name: ZAINAH STEVEN MRN: [id number] DOB: [birth year]   Cancelled Treatment:    Reason Eval/Treat Not Completed: Patient at procedure or test/unavailable. Will continue to follow and check back as schedule allows.    Rolinda Roan 12/11/2014, 10:57 AM   Rolinda Roan, PT, DPT Acute Rehabilitation Services Pager: [phone]

## 2014-12-12 DIAGNOSIS — K5900 Constipation, unspecified: Secondary | ICD-10-CM

## 2014-12-12 DIAGNOSIS — N189 Chronic kidney disease, unspecified: Secondary | ICD-10-CM | POA: Insufficient documentation

## 2014-12-12 DIAGNOSIS — N184 Chronic kidney disease, stage 4 (severe): Secondary | ICD-10-CM

## 2014-12-12 LAB — RENAL FUNCTION PANEL
Albumin: 2.5 g/dL — ABNORMAL LOW (ref 3.5–5.2)
Anion gap: 9 (ref 5–15)
BUN: 20 mg/dL (ref 6–23)
CHLORIDE: 97 mmol/L (ref 96–112)
CO2: 31 mmol/L (ref 19–32)
CREATININE: 3.71 mg/dL — AB (ref 0.50–1.10)
Calcium: 9.1 mg/dL (ref 8.4–10.5)
GFR calc Af Amer: 12 mL/min — ABNORMAL LOW (ref >90)
GFR calc non Af Amer: 10 mL/min — ABNORMAL LOW (ref >90)
Glucose, Bld: 141 mg/dL — ABNORMAL HIGH (ref 70–99)
Phosphorus: 5.1 mg/dL — ABNORMAL HIGH (ref 2.3–4.6)
Potassium: 3.6 mmol/L (ref 3.5–5.1)
Sodium: 137 mmol/L (ref 135–145)

## 2014-12-12 LAB — GLUCOSE, CAPILLARY
GLUCOSE-CAPILLARY: 129 mg/dL — AB (ref 70–99)
GLUCOSE-CAPILLARY: 108 mg/dL — AB (ref 70–99)
Glucose-Capillary: 120 mg/dL — ABNORMAL HIGH (ref 70–99)
Glucose-Capillary: 172 mg/dL — ABNORMAL HIGH (ref 70–99)

## 2014-12-12 MED ORDER — GABAPENTIN 300 MG PO CAPS
300.0000 mg | ORAL_CAPSULE | Freq: Once | ORAL | Status: AC
  Administered 2014-12-12: 300 mg via ORAL
  Filled 2014-12-12 (×2): qty 1

## 2014-12-12 MED ORDER — POLYETHYLENE GLYCOL 3350 17 G PO PACK
17.0000 g | PACK | Freq: Every day | ORAL | Status: DC
  Administered 2014-12-12 – 2014-12-15 (×4): 17 g via ORAL
  Filled 2014-12-12 (×4): qty 1

## 2014-12-12 MED ORDER — GABAPENTIN 300 MG PO CAPS: 300.0000 mg | ORAL_CAPSULE | Freq: Two times a day (BID) | ORAL | Status: DC

## 2014-12-12 MED ORDER — GABAPENTIN 300 MG PO CAPS: 300.0000 mg | ORAL_CAPSULE | Freq: Three times a day (TID) | ORAL | Status: DC

## 2014-12-12 MED ORDER — GABAPENTIN 300 MG PO CAPS
300.0000 mg | ORAL_CAPSULE | Freq: Once | ORAL | Status: AC
  Administered 2014-12-13: 300 mg via ORAL
  Filled 2014-12-12: qty 1

## 2014-12-12 NOTE — Progress Notes (Signed)
Physical Therapy Treatment Patient Details Name: [name] [name] MRN: [id number] DOB: 15-Sep-1926 Today's Date: 12/12/2014    History of Present Illness 81 yo F with PMH noted for L2 fx and kyphoplasty, ESRD on HD, CAD, DM2, HTN who presents with low back pain s/p fall yesterday. Found to have L5 compression fracture on initial films. Lumbar corset provided. MRI on 12/05/14 to evaluate whether pt is a candidate for vertebroplasty.     PT Comments    Pt progressing slowly towards physical therapy goals. Goal of session was to transfer pt to HD recliner and position for comfort. Per discussion with care team, goal is for pt to sit up in recliner up to 3.5 hours. Pt was educated on benefits of OOB, and need for attempt at [place] for this length of time. Will continue to follow.   Follow Up Recommendations  SNF;Supervision/Assistance - 24 hour     Equipment Recommendations  None recommended by PT    Recommendations for Other Services       Precautions / Restrictions Precautions Precautions: Fall Precaution Comments: Bed to chair only. NO AMBULATION.  Required Braces or Orthoses: Spinal Brace Spinal Brace: Lumbar corset Restrictions Weight Bearing Restrictions: No    Mobility  Bed Mobility Overal bed mobility: Needs Assistance;+2 for physical assistance Bed Mobility: Rolling;Sidelying to Sit Rolling: Max assist Sidelying to sit: Max assist;+2 for physical assistance       General bed mobility comments: Assist for all aspects of bed mobility. Bed pad used to assist with scooting and rolling.   Transfers Overall transfer level: Needs assistance Equipment used: 2 person hand held assist Transfers: Sit to/from [facility] Sit to Stand: Total assist;+2 physical assistance;Max assist Stand pivot transfers: Total assist;+2 physical assistance;Max assist       General transfer comment: +2 assist to power-up to full standing and take pivotal steps to the HD recliner  chair.  Ambulation/Gait             General Gait Details: Deferred as pt is to limit OOB only bed>chair at this time per MD note.    Stairs            Wheelchair Mobility    Modified Rankin (Stroke Patients Only)       Balance Overall balance assessment: Needs assistance Sitting-balance support: Feet supported;No upper extremity supported Sitting balance-Leahy Scale: Poor Sitting balance - Comments: Requires UE support - pt with posterior lean when fatigues.    Standing balance support: Bilateral upper extremity supported Standing balance-Leahy Scale: Zero                      Cognition Arousal/Alertness: Awake/alert Behavior During Therapy: Flat affect Overall Cognitive Status: No family/caregiver present to determine baseline cognitive functioning                      Exercises      General Comments        Pertinent Vitals/Pain Pain Assessment: 0-10 Pain Score: 7  Pain Location: low back Pain Descriptors / Indicators: Aching Pain Intervention(s): Limited activity within patient's tolerance;Monitored during session;Repositioned    Home Living                      Prior Function            PT Goals (current goals can now be found in the care plan section) Acute Rehab PT Goals Patient Stated Goal: Decrease pain PT  Goal Formulation: With patient Time For Goal Achievement: 12/19/14 Potential to Achieve Goals: Fair Progress towards PT goals: Progressing toward goals    Frequency  Min 3X/week    PT Plan Current plan remains appropriate    Co-evaluation PT/OT/SLP Co-Evaluation/Treatment: Yes Reason for Co-Treatment: For patient/therapist safety PT goals addressed during session: Mobility/safety with mobility;Balance OT goals addressed during session: ADL's and self-care     End of Session Equipment Utilized During Treatment: Back brace Activity Tolerance: Patient limited by pain;Patient limited by fatigue Patient  left: in chair;with call bell/phone within reach     Time: 1128-1145 PT Time Calculation (min) (ACUTE ONLY): 17 min  Charges:  $Therapeutic Activity: 8-22 mins                    G Codes:      Rolinda Roan 12-13-14, 12:13 PM   Rolinda Roan, PT, DPT Acute Rehabilitation Services Pager: [phone]

## 2014-12-12 NOTE — Progress Notes (Signed)
Patient ID: [name] [name], female   DOB: 28-May-1926, 81 y.o.   MRN: [id number]  Pt has been on IR radar for approx 1 week for possible L5 vertebroplasty/kyphoplasty  Admitting MD working on constipation and evacuation of residual barium in bowel. +BM 3/20 KUB 3/21: still with large amt of barium directly over where we need to be for procedure  per Dr Earleen Newport  Insurance still pending per IR scheduler  Cannot not move forward for 2 reasons: insurance pre certification pending                                                                   And                                                                  residual barium in bowel  Plan is to dc to SNF per chart Procedure can be performed as OP if MD deems appropriate

## 2014-12-12 NOTE — Progress Notes (Signed)
Occupational Therapy Treatment Patient Details Name: [name] [name] MRN: [id number] DOB: April 17, 1926 Today's Date: 12/12/2014    History of present illness 81 yo F with PMH noted for L2 fx and kyphoplasty, ESRD on HD, CAD, DM2, HTN who presents with low back pain s/p fall yesterday. Found to have L5 compression fracture on initial films. Lumbar corset provided. MRI on 12/05/14 to evaluate whether pt is a candidate for vertebroplasty.    OT comments  Co-treatment with PT to work on transfers/ADLs from sitting position. Patient making slow progress with OT, continues to benefit from skilled OT to maximize ADL independence.   Follow Up Recommendations  SNF;Supervision/Assistance - 24 hour    Equipment Recommendations  None recommended by OT    Recommendations for Other Services      Precautions / Restrictions Precautions Precautions: Fall Required Braces or Orthoses: Spinal Brace Spinal Brace: Lumbar corset Restrictions Weight Bearing Restrictions: No       Mobility Bed Mobility Overal bed mobility: Needs Assistance;+2 for physical assistance Bed Mobility: Rolling;Sidelying to Sit Rolling: Max assist Sidelying to sit: Max assist;+2 for physical assistance       General bed mobility comments: Assist for all aspects of bed mobility. Bed pad used to assist with scooting and rolling.   Transfers Overall transfer level: Needs assistance Equipment used: 2 person hand held assist Transfers: Sit to/from [facility] Sit to Stand: Max-Total assist;+2 physical assistance Stand pivot transfers: Max-Total assist;+2 physical assistance      General transfer comment: +2 assist to power-up to full standing and take pivotal steps to [facility] and then around to the chair.    Balance                                   ADL Overall ADL's : Needs assistance/impaired Eating/Feeding: Minimal assistance;Sitting   Grooming: Wash/dry hands;Wash/dry face;Oral  care;Brushing hair;Minimal assistance;Sitting                               Functional mobility during ADLs: Maximal assistance;+2 for safety/equipment;+2 for physical assistance;Cueing for safety General ADL Comments: Patient limited by weakness, pain, decreased activity tolerance. Worked on bed mobility, transfer to chair, grooming ADLs in chair. Physician asking to have patient in dialysis chair and work on sitting tolerance in chair. Previously only tolerating 1.5 hours, goal is 3.5 hours according to physician.      Vision                     Perception     Praxis      Cognition   Behavior During Therapy: Flat affect Overall Cognitive Status: No family/caregiver present to determine baseline cognitive functioning                       Extremity/Trunk Assessment               Exercises     Shoulder Instructions       General Comments      Pertinent Vitals/ Pain       Pain Assessment: 0-10 Pain Score: 7  Pain Location: lower back Pain Descriptors / Indicators: Aching;Sore Pain Intervention(s): Limited activity within patient's tolerance;Monitored during session  Home Living  Prior Functioning/Environment              Frequency Min 2X/week     Progress Toward Goals  OT Goals(current goals can now be found in the care plan section)  Progress towards OT goals: Progressing toward goals     Plan Discharge plan remains appropriate    Co-evaluation    PT/OT/SLP Co-Evaluation/Treatment: Yes Reason for Co-Treatment: For patient/therapist safety PT goals addressed during session: Mobility/safety with mobility OT goals addressed during session: ADL's and self-care      End of Session Equipment Utilized During Treatment: Back brace   Activity Tolerance Patient limited by pain;Patient limited by fatigue   Patient Left in chair;with call bell/phone within reach    Nurse Communication Mobility status        Time: 3903-0092 OT Time Calculation (min): 24 min  Charges: OT General Charges $OT Visit: 1 Procedure OT Treatments $Self Care/Home Management : 8-22 mins  Kayley Zeiders A 12/12/2014, 12:03 PM

## 2014-12-12 NOTE — Progress Notes (Signed)
Family Medicine Teaching Service Daily Progress Note Intern Pager: [phone]  Patient name: [name] [name] Medical record number: [id number] Date of birth: Jul 08, 1926 Age: 81 y.o. Gender: female  Primary Care Provider: Ann Held, MD Consultants: ortho, cards, IR, Nephro  Code Status: FULL   Pt Overview and Major Events to Date:  3/12: admitted for fall, found to have an L5 compression fx  3/13: lumbar films ordered  3/15: MRI lumbar spine to eval for vertebroplasty; per ortho 3/16: Patient had dialysis in AM, barium in colon. SS enema ineffective 3/17: Patient started on Senna S BID 3/18: Milk and molasses enema  3/19: "Bowel Prep"  Assessment and Plan: [name] [name] is a 81 y.o. female presenting with fall. PMH is significant for Hx L2 fracture w/ previous kypophlasty, ESRD on HD since 2015, mild non-obstructive CAD on cath in 2008 with evidence of HOCM, DM2, HTN, osteoporosis, GERD and dCHF.   # Fall/L5 compression fx: still in pain. Pt noted to have a large amount of barium in her bowel, given SS enema to evacuate which was ineffective. Start Senna S BID.  - fentanyl 12.5 mcg IV q4 PRN for severe pain - tramadol for moderate pain - tylenol for mild pain - Ortho consulted, Lumbar MRI obtained. Will have for IR vertebroplasty when approved and barium cleared - Continue Calcitonin nasally  - KUB 3/21 >> improvement but continues to have barium present. - SW made aware, consulted for possible SNF placement which PT has recommended   # Elevated Troponin/Demand ischemia: hx of having troponins elevated. No reports of SOB or chest pain.  - troponins had trended down, no further testing at this time   # Anemia of Chronic disease/MGUS: Has been seen by Dr. Marin Olp. Baseline hemoglobin 11.8 hematocrit 36.6. Blood studies showed a monoclonal spike of 0.18 g/dL. On a serum immunofixation, a IgG Kappa protein was noted. However, she had normal values of her immunoglobulins. -  Monitor BMET   # Acute on chronic dCHF  - ECHO 60% EF, grade 1 diastolic dysfunction  # HOCM,HTN, HLD, CAD - metoprolol 6.25 mg daily  - continue lipitor   # DM2: controlled. Hgb A1c 6.4 (11/08/13)  - Moderate SSI and back on home dose Levemir (10u QD)  # ESRD on HD: secondary to DM2. Dr. Posey Pronto is her nephrologist  - renal consulted  - Last HD 3/21  # Chronic dysphagia: patient with history of and a barium swallow study performed last year which showed esophageal dysmotility. EGD unremarkable  - protonix   FEN/GI: Renal diet, change to NPO when procedure approved, SLIV  PPx: heparin subQ   Disposition: SNF pending surgery   Subjective:  Endorses continued/unchanged back pain. Has now had 3 BMs in the past 36hrs. Is hoping to have procedure soon.  Objective: Temp:  [97.4 F (36.3 C)-98.4 F (36.9 C)] 98.3 F (36.8 C) (03/22 0546) Pulse Rate:  [55-71] 55 (03/22 0546) Resp:  [16-17] 17 (03/22 0546) BP: (113-135)/(30-66) 129/47 mmHg (03/22 0546) SpO2:  [95 %-99 %] 95 % (03/22 0546) Weight:  [142 lb 3.9 oz (64.52 kg)] 142 lb 3.9 oz (64.52 kg) (03/21 2008) Physical Exam: General: Elderly female resting in bed HEENT: EOMI, PERRL, MMM Cardiovascular: RR, 3/6 systolic murmur heard  Respiratory: clear to auscultation, no increased WOB  Abdomen: S/NT/ND, hypoactive BS Extremities: moves all freely, distal pulses intact Neuro: no gross deficits   Laboratory:  Recent Labs Lab 12/08/14 0935 12/08/14 1330 12/11/14 0730  WBC 9.9 9.9 12.0*  HGB 11.8* 11.1* 11.4*  HCT 35.9* 34.7* 35.4*  PLT 262 273 240    Recent Labs Lab 12/08/14 1330 12/11/14 0643 12/12/14 0728  NA 134* 133* 137  K 4.5 4.4 3.6  CL 91* 93* 97  CO2 31 30 31   BUN 62* 47* 20  CREATININE 5.58* 5.68* 3.71*  CALCIUM 9.3 9.2 9.1  GLUCOSE 222* 71 141*     Imaging/Diagnostic Tests: KUB 3/21 IMPRESSION: Moderate amount of residual barium within the colon with mild decrease compared  with 12/07/2014.  XR Abdomen 1 View COMPARISON: Portable abdominal film of December 06, 2014  IMPRESSION: There is considerable residual barium within the colon with evidence of some distal transit since yesterday's study.  Echocardiogram 3/15 Study Conclusions  - Left ventricle: Slight intracavitary gradient. No LVOT gradeint. The cavity size was normal. Wall thickness was increased in a pattern of moderate LVH. The estimated ejection fraction was 60%. Wall motion was normal; there were no regional wall motion abnormalities. Findings consistent with left ventricular diastolic dysfunction. Doppler parameters are consistent with high ventricular filling pressure. - Mitral valve: Moderate annular calcification, but no MS. Mild MR. - Left atrium: The atrium was moderately to severely dilated. - Right atrium: The atrium was mildly dilated. - Pulmonary arteries: PA peak pressure: 39 mm Hg (S).   Elberta Leatherwood, MD 12/12/2014, 9:32 AM [place] Intern pager: [phone], text pages welcome

## 2014-12-12 NOTE — Progress Notes (Signed)
Subjective: " i just had 3 bms' can  i have my procedure now "Tolerated HD Yest BUT IN BED , Need hd in recliner before dc to [facility] /may need ltac dc  Objective Vital signs in last 24 hours: Filed Vitals:   12/11/14 1213 12/11/14 2008 12/12/14 0546 12/12/14 1004  BP: 135/30 127/66 129/47 122/58  Pulse: 71 63 55 54  Temp: 97.4 F (36.3 C) 98.4 F (36.9 C) 98.3 F (36.8 C) 98.1 F (36.7 C)  TempSrc: Oral Oral Oral Oral  Resp: 16 17 17 18   Height:  5\' 1"  (1.549 m)    Weight:  64.52 kg (142 lb 3.9 oz)    SpO2: 99% 95% 95% 95%   Weight change:  Physical Exam General: alert and oriented, no acute distress.  Heart: RRR 2/6 systolic murmur  Lungs: CTA, unlabored  Abdomen: soft, nontender +BS  Extremities: no edema Dialysis Access: R AVF patent on HD   HD: MWF A sh [facility] 3h 36min 65 kg F160 400/A1.5 2/2.0 Bath Heparin 2000 RUA AVF Venofer 50/wk, no other meds  Problem/Plan 1. Acute L5 compression fracture - sec to fall 3/12./ Vertebroplast per IR / PT to work with this am to sit up in recliner . Can not have op ( [facility]) hd in bed needs to sit up in recliner ?? Dc to LTAC  2. Pulmonary edema / hypoxemia - moderate CHF per CXR 3/12- Resolved with uf on hd/under edw per bed wts, no sob 3. ESRD - HD on MWF @ [facility].HD today K+ 3.6 4. HTN/Volume - 122/58 bp  on Metoprolol 6.25 bid ; Below prior edw/ lower EDW at dc 5. Anemia - Hgb 11.4 Venofer qwk. No esa  6. Sec HPT - Ca 9.1(10.4 corrected), P 5.1 Fosrenol 1 g with meals./ no vit d / use low ca hd bath 7. Nutrition - Alb 2.5/ renal carb diet/ renal vit/ breeze supplement 8. Hx L2 compression fx - s/p kyphoplasty 09/2013. 9. DM 2 - insulin per primary 10. Constipation = per admit team   [name], PA-C [place] [phone] 12/12/2014,10:43 AM  LOS: 10 days   Pt seen, examined and agree w A/P as above. As noted above patient can not be dc'd to SNF unless able to sit in a chair for OP  dialysis.  Plan HD tomorrow.  Kelly Splinter MD pager [phone]    cell [phone] 12/12/2014, 1:52 PM    Labs: Basic Metabolic Panel:  Recent Labs Lab 12/08/14 1330 12/11/14 0643 12/12/14 0728  NA 134* 133* 137  K 4.5 4.4 3.6  CL 91* 93* 97  CO2 31 30 31   GLUCOSE 222* 71 141*  BUN 62* 47* 20  CREATININE 5.58* 5.68* 3.71*  CALCIUM 9.3 9.2 9.1  PHOS 4.5 5.9* 5.1*   Liver Function Tests:  Recent Labs Lab 12/08/14 1330 12/11/14 0643 12/12/14 0728  ALBUMIN 2.6* 2.4* 2.5*  CBC:  Recent Labs Lab 12/06/14 1150 12/08/14 0935 12/08/14 1330 12/11/14 0730  WBC 8.7 9.9 9.9 12.0*  NEUTROABS  --   --   --  7.5  HGB 10.9* 11.8* 11.1* 11.4*  HCT 34.8* 35.9* 34.7* 35.4*  MCV 96.9 95.5 95.9 95.2  PLT 303 262 273 240   Cardiac Enzymes: No results for input(s): CKTOTAL, CKMB, CKMBINDEX, TROPONINI in the last 168 hours. CBG:  Recent Labs Lab 12/10/14 2233 12/11/14 1210 12/11/14 1650 12/11/14 2013 12/12/14 0745  GLUCAP 193* 79 117* 258* 129*    Studies/Results: Dg  Abd 1 View  12/11/2014   CLINICAL DATA:  Constipated  EXAM: ABDOMEN - 1 VIEW  COMPARISON:  12/07/2014  FINDINGS: There is a moderate amount of retained barium within the colon. There is no bowel dilatation to suggest obstruction. There is no evidence of pneumoperitoneum, portal venous gas or pneumatosis. There are no pathologic calcifications along the expected course of the ureters.Prior L2 vertebral body augmentation. There is peripheral vascular atherosclerotic disease.  IMPRESSION: Moderate amount of residual barium within the colon with mild decrease compared with 12/07/2014.   Electronically Signed   By: Kathreen Devoid   On: 12/11/2014 16:17   Medications:   . amitriptyline  10 mg Oral QHS  . aspirin  81 mg Oral Daily  . atorvastatin  20 mg Oral q1800  . calcitonin (salmon)  1 spray Alternating Nares Daily  . febuxostat  40 mg Oral QHS  . feeding supplement (RESOURCE BREEZE)  1 Container Oral TID BM  .  heparin  5,000 Units Subcutaneous 3 times per day  . insulin aspart  0-15 Units Subcutaneous TID WC  . insulin detemir  10 Units Subcutaneous QHS  . lanthanum  1,000 mg Oral TID WC  . loratadine  10 mg Oral Daily  . metoprolol tartrate  6.25 mg Oral BID  . multivitamin  1 tablet Oral QHS  . pantoprazole sodium  40 mg Oral QHS  . polyethylene glycol  17 g Oral Daily  . senna-docusate  1 tablet Oral BID  . sodium chloride  3 mL Intravenous Q12H

## 2014-12-13 ENCOUNTER — Inpatient Hospital Stay (HOSPITAL_COMMUNITY): Payer: Medicare Other

## 2014-12-13 LAB — RENAL FUNCTION PANEL
ALBUMIN: 2.3 g/dL — AB (ref 3.5–5.2)
Anion gap: 7 (ref 5–15)
BUN: 30 mg/dL — ABNORMAL HIGH (ref 6–23)
CALCIUM: 9.1 mg/dL (ref 8.4–10.5)
CO2: 31 mmol/L (ref 19–32)
CREATININE: 4.69 mg/dL — AB (ref 0.50–1.10)
Chloride: 96 mmol/L (ref 96–112)
GFR calc Af Amer: 9 mL/min — ABNORMAL LOW (ref >90)
GFR calc non Af Amer: 8 mL/min — ABNORMAL LOW (ref >90)
Glucose, Bld: 162 mg/dL — ABNORMAL HIGH (ref 70–99)
PHOSPHORUS: 5.7 mg/dL — AB (ref 2.3–4.6)
Potassium: 4.1 mmol/L (ref 3.5–5.1)
Sodium: 134 mmol/L — ABNORMAL LOW (ref 135–145)

## 2014-12-13 LAB — GLUCOSE, CAPILLARY
GLUCOSE-CAPILLARY: 96 mg/dL (ref 70–99)
Glucose-Capillary: 157 mg/dL — ABNORMAL HIGH (ref 70–99)
Glucose-Capillary: 125 mg/dL — ABNORMAL HIGH (ref 70–99)
Glucose-Capillary: 191 mg/dL — ABNORMAL HIGH (ref 70–99)

## 2014-12-13 MED ORDER — PENTAFLUOROPROP-TETRAFLUOROETH EX AERO
INHALATION_SPRAY | CUTANEOUS | Status: AC
  Filled 2014-12-13: qty 103.5

## 2014-12-13 NOTE — H&P (Signed)
Chief Complaint: Chief Complaint  Patient presents with  . Fall  Worsening back pain Acute L1 and subacute L5 fracture per MRI  Referring Physician(s): TS  History of Present Illness: [name] [name] is a 81 y.o. female  Pt continues to have low back pain with almost any movement Ranks pain 8 or higher Is able to sleep if stays still Was able to sit up in chair for first time for few hrs yesterday IR has been aware of pt for 1 week Known L1 and subacute L5 fracture on MRI Request for kyphoplasty/vertebroplasty per TS 12/06/14 We have been awaiting Insurance approval which can take 3-7 business days (day 5 is today) Also of note; MD has been treating constipation to clear residual barium from colon prior to procedure She has had 3 BMs just yesterday---new KUB pending Dr Estanislado Pandy has also reviewed imaging---feels pt is candidate for Lumbar 1 and Lumbar 5 VP/KP Daughter aware of entire situation   Past Medical History  Diagnosis Date  . Type 2 diabetes mellitus     mellitus? x25 years  . Essential hypertension   . Hypercholesterolemia   . ESRD (end stage renal disease)   . Anemia of chronic disease   . CAD (coronary artery disease)     Nonobstructive   . GERD (gastroesophageal reflux disease)   . HOCM (hypertrophic obstructive cardiomyopathy)     Past Surgical History  Procedure Laterality Date  . Total knee arthroplasty      L-2006; R-2008   . Appendectomy    . Transthoracic echocardiogram  05/2011  . Av fistula placement  07/07/2012    Procedure: ARTERIOVENOUS (AV) FISTULA CREATION;  Surgeon: Mal Misty, MD;  Location: [facility] OR;  Service: Vascular;  Laterality: Right;  Brachial - cephalic arterivenous fistula  . Esophagogastroduodenoscopy (egd) with esophageal dilation N/A 10/12/2013    Procedure: ESOPHAGOGASTRODUODENOSCOPY (EGD) WITH ESOPHAGEAL DILATION;  Surgeon: Lafayette Dragon, MD;  Location: [facility] ENDOSCOPY;  Service: Endoscopy;  Laterality: N/A;  . Savory  dilation N/A 10/12/2013    Procedure: SAVORY DILATION;  Surgeon: Lafayette Dragon, MD;  Location: [facility] ENDOSCOPY;  Service: Endoscopy;  Laterality: N/A;  . Back surgery    . Fracture surgery      Allergies: Review of patient's allergies indicates no known allergies.  Medications: Prior to Admission medications   Medication Sig Start Date End Date Taking? Authorizing Provider  acetaminophen (TYLENOL) 500 MG tablet Take 1,000 mg by mouth every 6 (six) hours as needed (pain).   Yes Historical Provider, MD  amitriptyline (ELAVIL) 10 MG tablet Take 10 mg by mouth at bedtime.    Yes Historical Provider, MD  aspirin EC 81 MG tablet Take 81 mg by mouth daily.   Yes Historical Provider, MD  atorvastatin (LIPITOR) 20 MG tablet Take 1 tablet (20 mg total) by mouth daily. 11/11/13  Yes Velvet Bathe, MD  b complex-vitamin c-folic acid (NEPHRO-VITE) 0.8 MG TABS tablet Take 1 tablet by mouth at bedtime.   Yes Historical Provider, MD  cyanocobalamin (,VITAMIN B-12,) 1000 MCG/ML injection Inject 1,000 mcg into the muscle every 30 (thirty) days.   Yes Historical Provider, MD  febuxostat (ULORIC) 40 MG tablet Take 40 mg by mouth at bedtime.   Yes Historical Provider, MD  fexofenadine (ALLEGRA) 180 MG tablet Take 180 mg by mouth daily as needed for allergies or rhinitis.   Yes Historical Provider, MD  fish oil-omega-3 fatty acids 1000 MG capsule Take 1 g by mouth daily.  Yes Historical Provider, MD  Glucosamine-Chondroitin 250-200 MG CAPS Take 1 capsule by mouth daily.     Yes Historical Provider, MD  insulin detemir (LEVEMIR) 100 UNIT/ML injection Inject 10 Units into the skin daily.   Yes Historical Provider, MD  insulin regular (NOVOLIN R,HUMULIN R) 100 units/mL injection Inject 0-12 Units into the skin 3 (three) times daily before meals. Per sliding scale: 0-150=0 units, 151-200 = 2 units, 201-250 = 4 units, 251-300 = 6 units, 301-350 = 8 units, 351-400 = 10 units, 401-450 = 12 units, > 450 CALL MD.   Yes  Historical Provider, MD  lansoprazole (PREVACID) 30 MG capsule Take 30 mg by mouth daily at 12 noon.   Yes Historical Provider, MD  lanthanum (FOSRENOL) 500 MG chewable tablet Chew 1,000 mg by mouth 3 (three) times daily with meals.    Yes Historical Provider, MD  metoprolol succinate (TOPROL XL) 25 MG 24 hr tablet Take 0.5 tablets (12.5 mg total) by mouth daily. 11/29/13  Yes Scott T Kathlen Mody, PA-C  nystatin (MYCOSTATIN/NYSTOP) 100000 UNIT/GM POWD Apply 1 g topically as needed (affected area).   Yes Historical Provider, MD  OxyCODONE HCl, Abuse Deter, 5 MG TABA Take 5 mg by mouth every 4 (four) hours as needed (pain).    Yes Historical Provider, MD  potassium chloride SA (K-DUR,KLOR-CON) 20 MEQ tablet Take 20 mEq by mouth daily.   Yes Historical Provider, MD  calcitRIOL (ROCALTROL) 0.25 MCG capsule Take 1 capsule (0.25 mcg total) by mouth daily. Patient not taking: Reported on 12/02/2014 10/14/13   Nishant Dhungel, MD  diazepam (VALIUM) 5 MG tablet Take 1 tablet (5 mg total) by mouth every 12 (twelve) hours as needed for anxiety. 11/11/13   Velvet Bathe, MD  feeding supplement, GLUCERNA SHAKE, (GLUCERNA SHAKE) LIQD Take 237 mLs by mouth 2 (two) times daily between meals. Patient not taking: Reported on 12/02/2014 11/11/13   Velvet Bathe, MD  phenol (CHLORASEPTIC) 1.4 % LIQD Use as directed 1 spray in the mouth or throat 3 (three) times daily as needed for throat irritation / pain.    Historical Provider, MD  polyethylene glycol (MIRALAX / GLYCOLAX) packet Take 17 g by mouth at bedtime.    Historical Provider, MD  sodium bicarbonate 650 MG tablet Take 1 tablet (650 mg total) by mouth daily. Patient not taking: Reported on 12/02/2014 10/14/13   Nishant Dhungel, MD  traMADol (ULTRAM) 50 MG tablet Take 1 tablet (50 mg total) by mouth every 6 (six) hours as needed (pain). 11/11/13   Velvet Bathe, MD     Family History  Problem Relation Age of Onset  . Heart attack Father   . Stroke Mother     History    Social History  . Marital Status: Widowed    Spouse Name: N/A  . Number of Children: N/A  . Years of Education: N/A   Social History Main Topics  . Smoking status: Never Smoker   . Smokeless tobacco: Never Used     Comment: no smoking   . Alcohol Use: No  . Drug Use: No  . Sexual Activity: Not Currently    Birth Control/ Protection: Post-menopausal   Other Topics Concern  . None   Social History Narrative   Widowed. Retired and lives at assisted living      Review of Systems: A 12 point ROS discussed and pertinent positives are indicated in the HPI above.  All other systems are negative.  Review of Systems  Constitutional: Positive for activity  change and appetite change. Negative for fever.  Respiratory: Negative for cough, chest tightness and shortness of breath.   Cardiovascular: Negative for chest pain.  Gastrointestinal: Positive for nausea and abdominal pain.  Musculoskeletal: Positive for back pain and gait problem.  Neurological: Positive for weakness.  Psychiatric/Behavioral: Negative for behavioral problems and confusion.    Vital Signs: BP 107/59 mmHg  Pulse 52  Temp(Src) 98.7 F (37.1 C) (Oral)  Resp 18  Ht 5\' 1"  (1.549 m)  Wt 64.5 kg (142 lb 3.2 oz)  BMI 26.88 kg/m2  SpO2 98%  LMP  (LMP Unknown)  Physical Exam  Constitutional: She is oriented to person, place, and time. She appears well-nourished.  Cardiovascular: Normal rate, regular rhythm and normal heart sounds.   No murmur heard. Pulmonary/Chest: Effort normal and breath sounds normal. She has no wheezes.  Abdominal: Soft. Bowel sounds are normal. There is no tenderness.  Musculoskeletal: Normal range of motion.  Low back pain  Neurological: She is alert and oriented to person, place, and time.  Skin: Skin is warm and dry.  Psychiatric: She has a normal mood and affect. Her behavior is normal. Judgment and thought content normal.  Nursing note and vitals reviewed.   Mallampati  Score:  MD Evaluation Airway: WNL Heart: WNL Abdomen: WNL Chest/ Lungs: WNL ASA  Classification: 3 Mallampati/Airway Score: Two  Imaging: Dg Chest 1 View  12/02/2014   CLINICAL DATA:  Fall. Patient on dialysis and did not receive regular dialysis is yesterday due to fever.  EXAM: CHEST  1 VIEW  COMPARISON:  11/14/2013  FINDINGS: Marked cardiac enlargement noted. There is a left plural effusion present. Mild to moderate interstitial edema noted. There is multifocal airspace opacities within the left midlung and left base.  IMPRESSION: 1. Moderate CHF. 2. Multifocal airspace opacities in left lung may represent edema or infection.   Electronically Signed   By: Kerby Moors M.D.   On: 12/02/2014 13:10   Dg Lumbar Spine 2-3 Views  12/03/2014   CLINICAL DATA:  Low back pain since falling 2 days ago, history diabetes, hypertension, end-stage renal disease, coronary artery disease, prior back surgery with unspecified tight  EXAM: LUMBAR SPINE - 2-3 VIEW  COMPARISON:  MRI lumbar spine 01/17/2014, lumbar spine radiographs of 01/01/2014  FINDINGS: Five non-rib-bearing lumbar vertebrae.  Diffuse osseous demineralization.  Old superior endplate compression fracture of L5 vertebral body is identified identified; while this is new since prior radiographs of 01/01/2014, this was identified on the preceding MR though has probably slightly increased in degree of superior endplate compression since the prior MR.  Old superior endplate compression fracture of L2 vertebral body with again identified spinal augmentation procedure.  Remaining vertebra normal in height and alignment.  No definite acute fracture, subluxation or bone destruction.  Assessment of the lower lumbar spine on AP view is limited by superimposed retained contrast material within the colon.  LEFT SI joint preserved, RIGHT obscured.  Atherosclerotic calcifications aorta.  IMPRESSION: Old compression fracture L2 post spinal augmentation procedure.   Old compression fracture L5 suspect slightly increased since 2015 MRI.  Diffuse osseous demineralization.   Electronically Signed   By: Lavonia Dana M.D.   On: 12/03/2014 14:46   Dg Sacrum/coccyx  12/02/2014   CLINICAL DATA:  fall at her nursing facility ([facility]), pt reports pain in her sacrum. Pt is a dialysis pt and did not receive her regular dialysis yesterday due to fever. AP view is obscured. Pt denies any recent contrast studies.  Pt unwilling to lay flat due to pain level. Best obtainable images  EXAM: SACRUM AND COCCYX - 2+ VIEW  COMPARISON:  01/01/2014  FINDINGS: New compression fracture deformity of L5 with approximately 50% loss of height. Previous kyphoplasty/ vertebroplasty of L2 stable. Diffuse osteopenia. Old fracture deformities of the right superior and inferior pubic rami. Bilateral pelvic phleboliths. Extensive radiodense material in the colon as if before.  IMPRESSION: 1. L5 compression fracture deformity, new since prior study. If patient is under consideration for kyphoplasty/vertebroplasty, recommend CT or MR lumbar spine to exclude burst fracture or posterior element involvement.   Electronically Signed   By: Lucrezia Europe M.D.   On: 12/02/2014 13:13   Dg Abd 1 View  12/11/2014   CLINICAL DATA:  Constipated  EXAM: ABDOMEN - 1 VIEW  COMPARISON:  12/07/2014  FINDINGS: There is a moderate amount of retained barium within the colon. There is no bowel dilatation to suggest obstruction. There is no evidence of pneumoperitoneum, portal venous gas or pneumatosis. There are no pathologic calcifications along the expected course of the ureters.Prior L2 vertebral body augmentation. There is peripheral vascular atherosclerotic disease.  IMPRESSION: Moderate amount of residual barium within the colon with mild decrease compared with 12/07/2014.   Electronically Signed   By: Kathreen Devoid   On: 12/11/2014 16:17   Mr Lumbar Spine Wo Contrast  12/05/2014   CLINICAL DATA:  Low back pain after  falling yesterday. History of compression fractures of the spine.  EXAM: MRI LUMBAR SPINE WITHOUT CONTRAST  TECHNIQUE: Multiplanar, multisequence MR imaging of the lumbar spine was performed. No intravenous contrast was administered.  COMPARISON:  Radiographs dated 12/03/2014 and 01/01/2014 and MRIs dated 01/17/2014 and 09/24/2013  FINDINGS: Normal conus tip at L1-2. No significant abnormality of the paraspinal soft tissues.  T10-11 and T11-12:  Normal.  T12-L1: There is a new subtle probably acute or subacute benign appearing compression fracture of the superior endplate of L1. No protrusion of bone or disc material into spinal canal. The fracture appears to involve the base of both pedicles.  L1-2: Old compression fracture of L2 treated with vertebroplasty. Protrusion of the posterior superior aspect of the L2 vertebral body into the spinal canal with moderate compression of the thecal sac, more prominent than on the prior exam. No focal neural impingement.  L2-3:  No significant abnormality.  L3-4: No significant abnormality. No neural impingement. Posterior decompression.  L4-5: Old severe compression fracture of the L5 vertebra, progressed since the prior study. There could be a tiny acute or subacute component of the fracture of the anterior inferior aspect of the L5 vertebral body.  Posterior decompression.  No neural impingement.  L5-S1: Chronic compression deformity of L5, increased since the prior study. There is a small focal area of edema in the anterior inferior aspect of the L5 vertebra. No neural impingement. The facet joints appear fused at L5-S1.  IMPRESSION: 1. Interval at new subtle compression fracture of the superior aspect of L1. 2. Old compression fractures of L2 and L5. 3. Possible acute or subacute component of the L5 fracture anteriorly and inferiorly but this is not definitive. 4. Slight increased encroachment on the spinal canal by the posterior superior aspect of L2 but there is no focal  neural impingement.   Electronically Signed   By: Lorriane Shire M.D.   On: 12/05/2014 10:34   Dg Abd Portable 1v  12/07/2014   CLINICAL DATA:  Assess for residual barium  EXAM: PORTABLE ABDOMEN - 1 VIEW  COMPARISON:  Portable abdominal film of December 06, 2014  FINDINGS: There remains considerable barium in the ascending and transverse portions of the colon. Barium has migrated distally since the previous study. There is a small amount of gas in the rectosigmoid and a moderate amount of gas in the splenic flexure  IMPRESSION: There is considerable residual barium within the colon with evidence of some distal transit since yesterday's study.   Electronically Signed   By: David  Martinique   On: 12/07/2014 07:21   Dg Abd Portable 1v  12/06/2014   CLINICAL DATA:  Back pain  EXAM: PORTABLE ABDOMEN - 1 VIEW  COMPARISON:  None.  FINDINGS: There is a moderate amount of barium within the colon and to a lesser degree small bowel. There is no bowel dilatation to suggest obstruction. There is no evidence of pneumoperitoneum, portal venous gas or pneumatosis. There are no pathologic calcifications along the expected course of the ureters.The osseous structures are unremarkable.  IMPRESSION: No bowel obstruction.   Electronically Signed   By: Kathreen Devoid   On: 12/06/2014 13:45    Labs:  CBC:  Recent Labs  12/06/14 1150 12/08/14 0935 12/08/14 1330 12/11/14 0730  WBC 8.7 9.9 9.9 12.0*  HGB 10.9* 11.8* 11.1* 11.4*  HCT 34.8* 35.9* 34.7* 35.4*  PLT 303 262 273 240    COAGS:  Recent Labs  12/06/14 1219  INR 0.97  APTT 26    BMP:  Recent Labs  12/08/14 1330 12/11/14 0643 12/12/14 0728 12/13/14 0553  NA 134* 133* 137 134*  K 4.5 4.4 3.6 4.1  CL 91* 93* 97 96  CO2 31 30 31 31   GLUCOSE 222* 71 141* 162*  BUN 62* 47* 20 30*  CALCIUM 9.3 9.2 9.1 9.1  CREATININE 5.58* 5.68* 3.71* 4.69*  GFRNONAA 6* 6* 10* 8*  GFRAA 7* 7* 12* 9*    LIVER FUNCTION TESTS:  Recent Labs  12/08/14 1330  12/11/14 0643 12/12/14 0728 12/13/14 0553  ALBUMIN 2.6* 2.4* 2.5* 2.3*    TUMOR MARKERS: No results for input(s): AFPTM, CEA, CA199, CHROMGRNA in the last 8760 hours.  Assessment and Plan:  Continued back pain L1 and subacute L5 fx on MRI Kyphoplasty/vertebroplasty requested Risks and Benefits discussed with the patient including, but not limited to education regarding the natural healing process of compression fractures without intervention, bleeding, infection, cement migration which may cause spinal cord damage, paralysis, pulmonary embolism or even death. All of the patient's questions were answered, patient is agreeable to proceed. Consent signed and in chart.  Insurance approval pending KUB pending We will move forward dependent on these 2 things Pt and dtr aware of plan and agreeable This procedure can be performed as OP if MD feels appropiate  Thank you for this interesting consult.  I greatly enjoyed meeting [name] [name] and look forward to participating in their care.  Signed: Shulem Mader A 12/13/2014, 1:53 PM   I spent a total of 20 Minutes  in face to face in clinical consultation, greater than 50% of which was counseling/coordinating care for L1 and L5 KP/VP

## 2014-12-13 NOTE — Clinical Social Work Note (Signed)
CSW talked with patient's daughter Katheran Awe regarding discharge plans. She would like patient to discharge back to [facility] if they will accept her, if not she would like [facility] skilled facility. CSW talked with Athena Masse, nursing director at [facility] regarding patient. Clinicals transmitted to facility and in a later call, CSW inforomed that they cannot take her back at this time because of the care patient will need. after reviewor  At feel she needs too much assistance with everything - total care. Ms. Ronnald Ramp added that once patient has the surgery and is not in so much pain they will reassess her appropriateness to return to their facility. Call made to Kirke Shaggy ([phone]) with [facility] regarding Ms. Szabo. She will review clinicals and get back with CSW.  Call made to daughter and she was updated and informed that CSW will f/u with her once decision received from [facility].  Kalin Amrhein Givens, MSW, LCSW Licensed Clinical Social Worker [facility] [phone]

## 2014-12-13 NOTE — Progress Notes (Signed)
Patient ID: [name] [name], female   DOB: April 26, 1926, 81 y.o.   MRN: [id number]   Still on IR radar Pt with pain---unchanged  Insurance pending---scheduler [place] again today!! KUB pending  Again, we can perform as OP if needed

## 2014-12-13 NOTE — Progress Notes (Signed)
Family Medicine Teaching Service Daily Progress Note Intern Pager: [phone]  Patient name: [name] [name] Medical record number: [id number] Date of birth: November 17, 1925 Age: 81 y.o. Gender: female  Primary Care Provider: Ann Held, MD Consultants: ortho, cards, IR, Nephro  Code Status: FULL   Pt Overview and Major Events to Date:  3/12: admitted for fall, found to have an L5 compression fx  3/13: lumbar films ordered  3/15: MRI lumbar spine to eval for vertebroplasty; per ortho 3/16: Patient had dialysis in AM, barium in colon. SS enema ineffective 3/17: Patient started on Senna S BID 3/18: Milk and molasses enema  3/19: "Bowel Prep"  Assessment and Plan: [name] [name] is a 81 y.o. female presenting with fall. PMH is significant for Hx L2 fracture w/ previous kypophlasty, ESRD on HD since 2015, mild non-obstructive CAD on cath in 2008 with evidence of HOCM, DM2, HTN, osteoporosis, GERD and dCHF.   # Fall/L5 compression fx: still in pain. Pt noted to have a large amount of barium in her bowel. S/p SS enema, senna, and bowel prep. Now with several BMs.  to evacuate which was ineffective. Start Senna S BID.  - fentanyl 12.5 mcg IV q4 PRN for severe pain - tramadol for moderate pain - tylenol for mild pain - Ortho consulted, Lumbar MRI obtained.  -IR will follow with patient OP for possible vertebroplasty when able to get approval - Continue Calcitonin nasally  - KUB 3/21 >> improvement but continues to have barium present. - PT following patient - SW consulted for SNF placement which PT has recommended   -patient needs to be able to sit in a recliner before DC  # Elevated Troponin/Demand ischemia: hx of having troponins elevated. No reports of SOB or chest pain.  - troponins had trended down, no further testing at this time   # Anemia of Chronic disease/MGUS: Has been seen by Dr. Marin Olp. Baseline hemoglobin 11.8 hematocrit 36.6. Blood studies showed a monoclonal spike of  0.18 g/dL. On a serum immunofixation, a IgG Kappa protein was noted. However, she had normal values of her immunoglobulins. - Monitor BMET   # Acute on chronic dCHF  - ECHO 60% EF, grade 1 diastolic dysfunction  # HOCM,HTN, HLD, CAD. Bradycardia noted today with soft BPs. - metoprolol 6.25 mg daily - held at this time   Medication important for HOCM so consider alternatives if not able to tolerate - continue lipitor   # DM2: controlled. Hgb A1c 6.4 (11/08/13). Controlled - Moderate SSI and back on home dose Levemir (10u QD)  # ESRD on HD: secondary to DM2. Dr. Posey Pronto is her nephrologist . HD on MWF - renal consulted  - HD today -K 4.1  # Chronic dysphagia: patient with history of and a barium swallow study performed last year which showed esophageal dysmotility. EGD unremarkable  - protonix   FEN/GI: Renal diet, change to NPO when procedure approved, SLIV  PPx: heparin subQ   Disposition: SNF pending able to sit in a recliner vs able to get to HD in bed  Subjective:  Endorses continued/unchanged back pain that is tolerable with pain medication. Continues to have good bowel movements. Is hoping to have procedure soon. However discussed with patient that this can be followed up as an outpatient since approval is taking a long time. Patient states she was able to sit in recliner yesterday with pain medicine and tolerated it well.  Objective: Temp:  [98.1 F (36.7 C)-98.4 F (36.9 C)] 98.4 F (36.9  C) (03/23 0626) Pulse Rate:  [49-60] 49 (03/23 0626) Resp:  [17-18] 17 (03/23 0626) BP: (104-122)/(38-87) 104/38 mmHg (03/23 0626) SpO2:  [95 %-97 %] 96 % (03/23 0626) Weight:  [142 lb 3.2 oz (64.5 kg)] 142 lb 3.2 oz (64.5 kg) (03/22 2033) Physical Exam: General: Elderly female resting in bed, awake, NAD HEENT: EOMI, PERRL, MMM Cardiovascular: RR, 3/6 systolic murmur heard  Respiratory: clear to auscultation, no increased WOB  Abdomen: S/NT/ND, hypoactive BS Extremities:  moves all freely, distal pulses intact Neuro: no gross deficits   Laboratory:  Recent Labs Lab 12/08/14 0935 12/08/14 1330 12/11/14 0730  WBC 9.9 9.9 12.0*  HGB 11.8* 11.1* 11.4*  HCT 35.9* 34.7* 35.4*  PLT 262 273 240    Recent Labs Lab 12/11/14 0643 12/12/14 0728 12/13/14 0553  NA 133* 137 134*  K 4.4 3.6 4.1  CL 93* 97 96  CO2 30 31 31   BUN 47* 20 30*  CREATININE 5.68* 3.71* 4.69*  CALCIUM 9.2 9.1 9.1  GLUCOSE 71 141* 162*     Imaging/Diagnostic Tests: KUB 3/21 IMPRESSION: Moderate amount of residual barium within the colon with mild decrease compared with 12/07/2014.  XR Abdomen 1 View COMPARISON: Portable abdominal film of December 06, 2014  IMPRESSION: There is considerable residual barium within the colon with evidence of some distal transit since yesterday's study.  Echocardiogram 3/15 Study Conclusions  - Left ventricle: Slight intracavitary gradient. No LVOT gradeint. The cavity size was normal. Wall thickness was increased in a pattern of moderate LVH. The estimated ejection fraction was 60%. Wall motion was normal; there were no regional wall motion abnormalities. Findings consistent with left ventricular diastolic dysfunction. Doppler parameters are consistent with high ventricular filling pressure. - Mitral valve: Moderate annular calcification, but no MS. Mild MR. - Left atrium: The atrium was moderately to severely dilated. - Right atrium: The atrium was mildly dilated. - Pulmonary arteries: PA peak pressure: 39 mm Hg (S).   Katheren Shams, DO 12/13/2014, 8:05 AM [facility] Intern pager: [phone], text pages welcome

## 2014-12-13 NOTE — Progress Notes (Addendum)
Subjective:  "was up in chair 3.5 hours yesterday" wants to try hd today in recliner/ INSURANCE OK STILL Pending for Vertebroplasty  Objective Vital signs in last 24 hours: Filed Vitals:   12/12/14 0546 12/12/14 1004 12/12/14 2033 12/13/14 0626  BP: 129/47 122/58 117/87 104/38  Pulse: 55 54 60 49  Temp: 98.3 F (36.8 C) 98.1 F (36.7 C) 98.3 F (36.8 C) 98.4 F (36.9 C)  TempSrc: Oral Oral Oral Oral  Resp: 17 18 17 17   Height:   5\' 1"  (1.549 m)   Weight:   64.5 kg (142 lb 3.2 oz)   SpO2: 95% 95% 97% 96%   Weight change: 0.6 kg (1 lb 5.2 oz) Physical Exam General: alert and oriented, no acute distress.  Heart: Rare irreg , VR XIPJAS,5/0 systolic murmur, no rub or gallop  Lungs: CTA, unlabored  Abdomen: soft, nontender/ ND +BS  Extremities: no  Pedal edema Dialysis Access: R AVF pos. Bruit  HD   HD: MWF A sh [facility] 3h 51min 65 kg F160 400/A1.5 2/2.0 Bath Heparin 2000 RUA AVF Venofer 50/wk, no other meds  Problem/Plan 1. Acute L5 compression fracture - sec to fall 3/12./ Vertebroplast per IR /  Attempt HD in Recliner today  . Can not have op ( [place]) hd in bed needs to sit up in recliner ?? Dc to LTAC  2. Pulmonary edema / hypoxemia - moderate CHF per CXR 3/12- Resolved with uf on hd/under edw per bed wts, no sob 3. ESRD - HD on MWF @ [facility].HD today K+ 4.1/ attempt hd in recliner 4. HTN/Volume - 104/38 bp on Metoprolol 6.25 bid ; Below prior edw/ lower EDW at dc. BP's soft and HR low, on MTP, no afib. Will d/c.   5. Anemia - Hgb 11.4 Venofer qwk. No esa  6. Sec HPT - Ca 9.1(10.6corrected), P 5.7 Fosrenol 1 g with meals./ no vit d / use low ca hd bath 7. Nutrition - Alb 2.3/ renal carb diet/ renal vit/ breeze supplement 8. Hx L2 compression fx - s/p kyphoplasty 09/2013. 9. DM 2 - insulin per primary 10. Constipation = per admit team  [name], PA-C [place] [phone] 12/13/2014,8:41 AM  LOS: 11 days   Pt seen, examined  and agree w A/P as above.  Kelly Splinter MD pager [phone]    cell [phone] 12/13/2014, 12:01 PM    Labs: Basic Metabolic Panel:  Recent Labs Lab 12/11/14 0643 12/12/14 0728 12/13/14 0553  NA 133* 137 134*  K 4.4 3.6 4.1  CL 93* 97 96  CO2 30 31 31   GLUCOSE 71 141* 162*  BUN 47* 20 30*  CREATININE 5.68* 3.71* 4.69*  CALCIUM 9.2 9.1 9.1  PHOS 5.9* 5.1* 5.7*   Liver Function Tests:  Recent Labs Lab 12/11/14 0643 12/12/14 0728 12/13/14 0553  ALBUMIN 2.4* 2.5* 2.3*  CBC:  Recent Labs Lab 12/06/14 1150 12/08/14 0935 12/08/14 1330 12/11/14 0730  WBC 8.7 9.9 9.9 12.0*  NEUTROABS  --   --   --  7.5  HGB 10.9* 11.8* 11.1* 11.4*  HCT 34.8* 35.9* 34.7* 35.4*  MCV 96.9 95.5 95.9 95.2  PLT 303 262 273 240   Cardiac Enzymes: No results for input(s): CKTOTAL, CKMB, CKMBINDEX, TROPONINI in the last 168 hours. CBG:  Recent Labs Lab 12/12/14 0745 12/12/14 1203 12/12/14 1715 12/12/14 2129 12/13/14 0803  GLUCAP 129* 108* 120* 172* 157*    Studies/Results: Dg Abd 1 View  12/11/2014   CLINICAL DATA:  Constipated  EXAM: ABDOMEN - 1 VIEW  COMPARISON:  12/07/2014  FINDINGS: There is a moderate amount of retained barium within the colon. There is no bowel dilatation to suggest obstruction. There is no evidence of pneumoperitoneum, portal venous gas or pneumatosis. There are no pathologic calcifications along the expected course of the ureters.Prior L2 vertebral body augmentation. There is peripheral vascular atherosclerotic disease.  IMPRESSION: Moderate amount of residual barium within the colon with mild decrease compared with 12/07/2014.   Electronically Signed   By: Kathreen Devoid   On: 12/11/2014 16:17   Medications:   . amitriptyline  10 mg Oral QHS  . aspirin  81 mg Oral Daily  . atorvastatin  20 mg Oral q1800  . calcitonin (salmon)  1 spray Alternating Nares Daily  . febuxostat  40 mg Oral QHS  . feeding supplement (RESOURCE BREEZE)  1 Container Oral TID BM  .  heparin  5,000 Units Subcutaneous 3 times per day  . insulin aspart  0-15 Units Subcutaneous TID WC  . insulin detemir  10 Units Subcutaneous QHS  . lanthanum  1,000 mg Oral TID WC  . loratadine  10 mg Oral Daily  . metoprolol tartrate  6.25 mg Oral BID  . multivitamin  1 tablet Oral QHS  . pantoprazole sodium  40 mg Oral QHS  . polyethylene glycol  17 g Oral Daily  . senna-docusate  1 tablet Oral BID  . sodium chloride  3 mL Intravenous Q12H

## 2014-12-14 ENCOUNTER — Inpatient Hospital Stay (HOSPITAL_COMMUNITY): Payer: Medicare Other

## 2014-12-14 LAB — RENAL FUNCTION PANEL
ALBUMIN: 2.4 g/dL — AB (ref 3.5–5.2)
Anion gap: 8 (ref 5–15)
BUN: 16 mg/dL (ref 6–23)
CALCIUM: 9.2 mg/dL (ref 8.4–10.5)
CO2: 30 mmol/L (ref 19–32)
CREATININE: 3.2 mg/dL — AB (ref 0.50–1.10)
Chloride: 99 mmol/L (ref 96–112)
GFR calc Af Amer: 14 mL/min — ABNORMAL LOW (ref >90)
GFR, EST NON AFRICAN AMERICAN: 12 mL/min — AB (ref >90)
Glucose, Bld: 78 mg/dL (ref 70–99)
Phosphorus: 3.9 mg/dL (ref 2.3–4.6)
Potassium: 3.5 mmol/L (ref 3.5–5.1)
Sodium: 137 mmol/L (ref 135–145)

## 2014-12-14 LAB — GLUCOSE, CAPILLARY
GLUCOSE-CAPILLARY: 36 mg/dL — AB (ref 70–99)
GLUCOSE-CAPILLARY: 113 mg/dL — AB (ref 70–99)
GLUCOSE-CAPILLARY: 62 mg/dL — AB (ref 70–99)
Glucose-Capillary: 70 mg/dL (ref 70–99)
Glucose-Capillary: 357 mg/dL — ABNORMAL HIGH (ref 70–99)
Glucose-Capillary: 340 mg/dL — ABNORMAL HIGH (ref 70–99)

## 2014-12-14 MED ORDER — FENTANYL CITRATE 0.05 MG/ML IJ SOLN
INTRAMUSCULAR | Status: AC | PRN
  Administered 2014-12-14 (×2): 12.5 ug via INTRAVENOUS

## 2014-12-14 MED ORDER — HYDROMORPHONE HCL 1 MG/ML IJ SOLN
INTRAMUSCULAR | Status: AC
  Filled 2014-12-14: qty 2

## 2014-12-14 MED ORDER — MIDAZOLAM HCL 2 MG/2ML IJ SOLN
INTRAMUSCULAR | Status: AC | PRN
  Administered 2014-12-14 (×2): 0.5 mg via INTRAVENOUS

## 2014-12-14 MED ORDER — SODIUM CHLORIDE 0.9 % IV SOLN: INTRAVENOUS | Status: AC

## 2014-12-14 MED ORDER — BUPIVACAINE HCL (PF) 0.25 % IJ SOLN
INTRAMUSCULAR | Status: AC
  Filled 2014-12-14: qty 30

## 2014-12-14 MED ORDER — MIDAZOLAM HCL 2 MG/2ML IJ SOLN
INTRAMUSCULAR | Status: AC
  Filled 2014-12-14: qty 4

## 2014-12-14 MED ORDER — CEFAZOLIN SODIUM-DEXTROSE 2-3 GM-% IV SOLR
2.0000 g | Freq: Once | INTRAVENOUS | Status: AC
  Administered 2014-12-14: 2 g via INTRAVENOUS
  Filled 2014-12-14: qty 50

## 2014-12-14 MED ORDER — FENTANYL CITRATE 0.05 MG/ML IJ SOLN
INTRAMUSCULAR | Status: AC
  Filled 2014-12-14: qty 2

## 2014-12-14 MED ORDER — IOHEXOL 300 MG/ML  SOLN
50.0000 mL | Freq: Once | INTRAMUSCULAR | Status: AC | PRN
  Administered 2014-12-14: 6 mL

## 2014-12-14 MED ORDER — TOBRAMYCIN SULFATE 1.2 G IJ SOLR
INTRAMUSCULAR | Status: AC
  Filled 2014-12-14: qty 1.2

## 2014-12-14 NOTE — Progress Notes (Signed)
Received call from Dr. Kennon Holter, patient to be d/c'd in am.  Daughter, "[name]" notified, Lorriane Shire, [place]  notified.  SW to call [facility] and notify of anticipated arrival 3/25.

## 2014-12-14 NOTE — Progress Notes (Signed)
Hypoglycemic Event  CBG: 62  Treatment: 15 GM carbohydrate snack  Symptoms: Sweaty  Follow-up CBG: Time: CBG Result:Results for [name], [name] (MRN [id number]) as of 12/14/2014 22:13  Ref. Range 12/14/2014 22:08  Glucose-Capillary Latest Range: 70-99 mg/dL 113 (H)    Possible Reasons for Event: Inadequate meal intake  Comments/MD notified:    [name] [name]  Remember to initiate Hypoglycemia Order Set & complete

## 2014-12-14 NOTE — Sedation Documentation (Signed)
MD talking to family and pt. Will l transfer back to unit.

## 2014-12-14 NOTE — Progress Notes (Signed)
Hypoglycemic Event  CBG: 36  Treatment: 15 GM carbohydrate snack  Symptoms: Pale  Follow-up CBG: Time:2147 CBG Result 62  Possible Reasons for Event: Inadequate meal intake  Comments/MD notified:    [name] [name]  Remember to initiate Hypoglycemia Order Set & complete

## 2014-12-14 NOTE — Sedation Documentation (Signed)
Pt  tolerated procedure well.

## 2014-12-14 NOTE — Progress Notes (Addendum)
INITIAL NUTRITION ASSESSMENT  Pt meets criteria for NON-SEVERE (MODERATE) MALNUTRITION in the context of chronic illness as evidenced by moderate fat and muscle mass loss.  DOCUMENTATION CODES Per approved criteria  -Non-severe (moderate) malnutrition in the context of chronic illness   INTERVENTION: Once diet advances, continue Resource Breeze po TID, each supplement provides 250 kcal and 9 grams of protein.  NUTRITION DIAGNOSIS: Increased nutrient needs related to chronic illness, ESRD as evidenced by estimated nutrition needs.   Goal: Pt to meet >/= 90% of their estimated nutrition needs   Monitor:  Diet advancement, PO intake, weight trends, labs, I/O's  Reason for Assessment: MD consult for assessment of nutrition requirements/status  81 y.o. female  Admitting Dx: <principal problem not specified>  ASSESSMENT: Pt PMH noted for L2 fx and kyphoplasty, ESRD on HD, CAD, DM2, HTN who presents with low back pain s/p fall. Found to have L5 compression fracture.  Pt is currently NPO for procedure today. Pt reports having a good appetite currently and PTA eating 3 full meals a day with an occasional nutritional supplement. Currently meal completion has been varied from 25-100%. Pt with weight loss per Epic weight records (15% in 1 year) however is not found significant. Pt currently has [employer] ordered and has been consuming them. Pt reports she would like to continue with them. Will continue with current orders.   Nutrition Focused Physical Exam:  Subcutaneous Fat:  Orbital Region: WNL Upper Arm Region: Moderate depletion Thoracic and Lumbar Region: WNL  Muscle:  Temple Region: N/A Clavicle Bone Region: Moderate depletion Clavicle and Acromion Bone Region: Moderate depletion Scapular Bone Region: N/A Dorsal Hand: N/A Patellar Region: WNL Anterior Thigh Region: Moderate depletion Posterior Calf Region: Moderate depletion  Edema: none  Labs: Low GFR. High  creatinine.  Height: Ht Readings from Last 1 Encounters:  12/12/14 5\' 1"  (1.549 m)    Weight: Wt Readings from Last 1 Encounters:  12/13/14 144 lb 3.2 oz (65.409 kg)    Ideal Body Weight: 105 lbs  % Ideal Body Weight: 137%  Wt Readings from Last 10 Encounters:  12/13/14 144 lb 3.2 oz (65.409 kg)  04/04/14 152 lb (68.947 kg)  12/06/13 170 lb (77.111 kg)  11/29/13 177 lb (80.287 kg)  11/11/13 171 lb 4.8 oz (77.7 kg)  10/14/13 196 lb 3.4 oz (89 kg)  08/17/12 184 lb (83.462 kg)  07/06/12 184 lb (83.462 kg)  05/27/11 200 lb (90.719 kg)  03/20/09 199 lb (90.266 kg)    Usual Body Weight: 150 lbs per pt report  % Usual Body Weight: 96%  BMI:  Body mass index is 27.26 kg/(m^2).  Estimated Nutritional Needs: Kcal: 5462-7035 Protein: 80-90 grams Fluid: 1.2 L/day  Skin: intact  Diet Order: Diet renal/carb modified with 1200 ml fluid restriction  EDUCATION NEEDS: -No education needs identified at this time   Intake/Output Summary (Last 24 hours) at 12/14/14 0945 Last data filed at 12/14/14 0600  Gross per 24 hour  Intake    480 ml  Output   1025 ml  Net   -545 ml    Last BM: 3/22  Labs:   Recent Labs Lab 12/12/14 0728 12/13/14 0553 12/14/14 0553  NA 137 134* 137  K 3.6 4.1 3.5  CL 97 96 99  CO2 31 31 30   BUN 20 30* 16  CREATININE 3.71* 4.69* 3.20*  CALCIUM 9.1 9.1 9.2  PHOS 5.1* 5.7* 3.9  GLUCOSE 141* 162* 78    CBG (last 3)   Recent  Labs  12/13/14 1904 12/13/14 2123 12/14/14 0819  GLUCAP 96 191* 70    Scheduled Meds: . amitriptyline  10 mg Oral QHS  . aspirin  81 mg Oral Daily  . atorvastatin  20 mg Oral q1800  . calcitonin (salmon)  1 spray Alternating Nares Daily  . febuxostat  40 mg Oral QHS  . feeding supplement (RESOURCE BREEZE)  1 Container Oral TID BM  . heparin  5,000 Units Subcutaneous 3 times per day  . insulin aspart  0-15 Units Subcutaneous TID WC  . insulin detemir  10 Units Subcutaneous QHS  . lanthanum  1,000 mg Oral  TID WC  . loratadine  10 mg Oral Daily  . multivitamin  1 tablet Oral QHS  . pantoprazole sodium  40 mg Oral QHS  . polyethylene glycol  17 g Oral Daily  . senna-docusate  1 tablet Oral BID  . sodium chloride  3 mL Intravenous Q12H    Continuous Infusions:   Past Medical History  Diagnosis Date  . Type 2 diabetes mellitus     mellitus? x25 years  . Essential hypertension   . Hypercholesterolemia   . ESRD (end stage renal disease)   . Anemia of chronic disease   . CAD (coronary artery disease)     Nonobstructive   . GERD (gastroesophageal reflux disease)   . HOCM (hypertrophic obstructive cardiomyopathy)     Past Surgical History  Procedure Laterality Date  . Total knee arthroplasty      L-2006; R-2008   . Appendectomy    . Transthoracic echocardiogram  05/2011  . Av fistula placement  07/07/2012    Procedure: ARTERIOVENOUS (AV) FISTULA CREATION;  Surgeon: Mal Misty, MD;  Location: [facility] OR;  Service: Vascular;  Laterality: Right;  Brachial - cephalic arterivenous fistula  . Esophagogastroduodenoscopy (egd) with esophageal dilation N/A 10/12/2013    Procedure: ESOPHAGOGASTRODUODENOSCOPY (EGD) WITH ESOPHAGEAL DILATION;  Surgeon: Lafayette Dragon, MD;  Location: [facility] ENDOSCOPY;  Service: Endoscopy;  Laterality: N/A;  . Savory dilation N/A 10/12/2013    Procedure: SAVORY DILATION;  Surgeon: Lafayette Dragon, MD;  Location: [facility] ENDOSCOPY;  Service: Endoscopy;  Laterality: N/A;  . Back surgery    . Fracture surgery      Kallie Locks, MS, RD, LDN Pager # [phone] After hours/ weekend pager # [phone]

## 2014-12-14 NOTE — Progress Notes (Signed)
Family Medicine Teaching Service Daily Progress Note Intern Pager: [phone]  Patient name: [name] [name] Medical record number: [id number] Date of birth: [birth year] Age: 81 y.o. Gender: female  Primary Care Provider: Ann Held, MD Consultants: ortho, cards, IR, Nephro  Code Status: FULL   Pt Overview and Major Events to Date:  3/12: admitted for fall, found to have an L5 compression fx  3/13: lumbar films ordered  3/15: MRI lumbar spine to eval for vertebroplasty; per ortho 3/16: Patient had dialysis in AM, barium in colon. SS enema ineffective 3/17: Patient started on Senna S BID 3/18: Milk and molasses enema  3/19: "Bowel Prep"  Assessment and Plan: [name] [name] is a 81 y.o. female presenting with fall. PMH is significant for Hx L2 fracture w/ previous kypophlasty, ESRD on HD since 2015, mild non-obstructive CAD on cath in 2008 with evidence of HOCM, DM2, HTN, osteoporosis, GERD and dCHF.   # Fall/L5 compression fx: still in pain. Pt w/ retained barium in bowel. Will not reassess this until clearance for kyphoplasty obtained. Patient has been treated conservatively with IV narcotics and calcitonin for pain management secondary to these compression fractures. Today is day 12 of admission, I believe our current assessment is that patient has failed this conservative treatment as her pain is unchanged from admission. Discussion to discharge patient to a care facility has been made and actions are being taken. However, I believe that discharge to a facility without the proper resources, care, manpower to properly assist patient and care for patient would be irresponsible without more aggressive intervention. In short -- conservative pain management is failing, I strongly suggest more aggressive treatment plans at this time. - fentanyl 12.5 mcg IV q4 PRN for severe pain - tramadol for moderate pain - tylenol for mild pain - Ortho consulted, Lumbar MRI obtained.  - IR will follow  with patient OP for possible vertebroplasty when able to get approval - Continue Calcitonin nasally  - KUB 3/21 >> improvement but continues to have barium present. - PT following patient - SW consulted for SNF placement which PT has recommended   - patient needs to be able to sit in a recliner before DC  - patient was able to sit up for HD 3/23  # Elevated Troponin/Demand ischemia: hx of having troponins elevated. No reports of SOB or chest pain.  - troponins had trended down, no further testing at this time   # Anemia of Chronic disease/MGUS: Has been seen by Dr. Marin Olp. Baseline hemoglobin 11.8 hematocrit 36.6. Blood studies showed a monoclonal spike of 0.18 g/dL. On a serum immunofixation, a IgG Kappa protein was noted. However, she had normal values of her immunoglobulins. - Monitor BMET   # Acute on chronic dCHF  - ECHO 60% EF, grade 1 diastolic dysfunction  # HOCM,HTN, HLD, CAD. Bradycardia noted today with soft BPs. - metoprolol 6.25 mg daily - held at this time   Medication important for HOCM so consider alternatives if not able to tolerate - continue lipitor   # DM2: controlled. Hgb A1c 6.4 (11/08/13). Controlled - Moderate SSI and back on home dose Levemir (10u QD)  # ESRD on HD: secondary to DM2. Dr. Posey Pronto is her nephrologist . HD on MWF - renal consulted  - HD today -K 4.1  # Chronic dysphagia: patient with history of and a barium swallow study performed last year which showed esophageal dysmotility. EGD unremarkable  - protonix   FEN/GI: Renal diet, change to NPO when procedure  approved, SLIV  PPx: heparin subQ   Disposition: SNF pending able to sit in a recliner vs able to get to HD in bed  Subjective:  Pain unchanged from prior assessment. Patient continues to be hopeful that we will be able to perform kyphoplasty. I informed her that we are currently searching for a outpatient facility and that I would still strongly recommend seeking kyphoplasty on  an outpatient basis if she is discharged.  Objective: Temp:  [97.5 F (36.4 C)-98.3 F (36.8 C)] 98.3 F (36.8 C) (03/24 1008) Pulse Rate:  [56-74] 66 (03/24 1008) Resp:  [16-18] 16 (03/24 1008) BP: (96-131)/(28-70) 131/34 mmHg (03/24 1008) SpO2:  [96 %-99 %] 99 % (03/24 1008) Weight:  [144 lb 3.2 oz (65.409 kg)] 144 lb 3.2 oz (65.409 kg) (03/23 2054) Physical Exam: General: Elderly female resting in bed, awake, NAD HEENT: EOMI, PERRL, MMM Cardiovascular: RR, 3/6 systolic murmur heard  Respiratory: clear to auscultation, no increased WOB  Abdomen: S/NT/ND, hypoactive BS Extremities: moves all freely, distal pulses intact; extreme point tenderness to the lumbar spine (L5). Patient brought to tears w/ very little pressure. Denies radiation at this time. Neuro: no gross deficits   Laboratory:  Recent Labs Lab 12/08/14 0935 12/08/14 1330 12/11/14 0730  WBC 9.9 9.9 12.0*  HGB 11.8* 11.1* 11.4*  HCT 35.9* 34.7* 35.4*  PLT 262 273 240    Recent Labs Lab 12/12/14 0728 12/13/14 0553 12/14/14 0553  NA 137 134* 137  K 3.6 4.1 3.5  CL 97 96 99  CO2 31 31 30   BUN 20 30* 16  CREATININE 3.71* 4.69* 3.20*  CALCIUM 9.1 9.1 9.2  GLUCOSE 141* 162* 78     Imaging/Diagnostic Tests: KUB 3/21 IMPRESSION: Moderate amount of residual barium within the colon with mild decrease compared with 12/07/2014.  XR Abdomen 1 View COMPARISON: Portable abdominal film of December 06, 2014  IMPRESSION: There is considerable residual barium within the colon with evidence of some distal transit since yesterday's study.  Echocardiogram 3/15 Study Conclusions  - Left ventricle: Slight intracavitary gradient. No LVOT gradeint. The cavity size was normal. Wall thickness was increased in a pattern of moderate LVH. The estimated ejection fraction was 60%. Wall motion was normal; there were no regional wall motion abnormalities. Findings consistent with left ventricular diastolic  dysfunction. Doppler parameters are consistent with high ventricular filling pressure. - Mitral valve: Moderate annular calcification, but no MS. Mild MR. - Left atrium: The atrium was moderately to severely dilated. - Right atrium: The atrium was mildly dilated. - Pulmonary arteries: [name] peak pressure: 39 mm Hg (S).   Elberta Leatherwood, MD 12/14/2014, 11:31 AM [place] Intern pager: [phone], text pages welcome

## 2014-12-14 NOTE — Clinical Social Work Note (Signed)
CSW advised this morning that insurance approval received to do procedure and it will be done at 3 pm today. CSW contacted daughter, [name] and updated her and charge nurse will contact her with time of procedure. CSW also talked with admissions staff person Margarita Grizzle with [facility] to advise of today's procedure and no discharge today. CSW will continue to follow and assist with d/c to a skilled facility for rehab once ready for discharge.  Dorman Calderwood [place]ivens, MSW, LCSW Licensed Clinical Social Worker [place] [phone]

## 2014-12-14 NOTE — Procedures (Signed)
S/P LI and L5 VP

## 2014-12-14 NOTE — Progress Notes (Addendum)
PT Cancellation Note  Patient Details Name: CHELE CORNELL MRN: [id number] DOB: Dec 21, 1925   Cancelled Treatment:    Reason Eval/Treat Not Completed: Pt to OR today for kyphoplasty per RN. Will hold PT treatment this date and re-evaluate after procedure. Will need new PT orders.   Addendum: Pt for vertebroplasty and no new PT orders are required.    Rolinda Roan 12/14/2014, 11:09 AM   Rolinda Roan, PT, DPT Acute Rehabilitation Services Pager: [phone]

## 2014-12-14 NOTE — Progress Notes (Signed)
Per IR, patient for kyphoplasty this afternoon at 3pm (pick up between 2:15 and 2:30).  Spoke with [place], (daughter) and informed of procedure and time. Spoke with Jannifer Franklin PA and she had attempted to call daughter without success.  Gave Pam different number and she is to attempt call.

## 2014-12-14 NOTE — Progress Notes (Signed)
The primary team appreciates the hard work done by CSW to have this patient's procedure cleared by insurance, as well as IR's rapid action to proceed with getting this patient on their schedule.   Elberta Leatherwood, MD,MS,  PGY1 12/14/2014 11:58 AM

## 2014-12-14 NOTE — Progress Notes (Signed)
Subjective:  Tolerated HD in Recliner yesterday/ continued Lower back pain IR awaiting INSURANCE Input STILL  Objective Vital signs in last 24 hours: Filed Vitals:   12/13/14 1730 12/13/14 1828 12/13/14 2054 12/14/14 0551  BP: 118/47 131/28 123/47 115/39  Pulse: 65 66 68 58  Temp: 97.5 F (36.4 C) 97.6 F (36.4 C) 97.8 F (36.6 C) 98 F (36.7 C)  TempSrc: Oral Oral Oral Oral  Resp: 16 16 18 16   Height:      Weight:   65.409 kg (144 lb 3.2 oz)   SpO2: 96% 96% 99% 98%   Weight change: 0.909 kg (2 lb 0.1 oz) Physical Exam General: alert and oriented, no acute distress.  Heart: RRR , this am ,2/6 systolic murmur, no rub or gallop  Lungs: CTA, unlabored  Abdomen: soft, nontender/ ND +BS  Extremities: no Pedal edema Dialysis Access: R AVF pos. Bruit HD   HD: MWF A sh [facility] 3h 49min 65 kg F160 400/A1.5 2/2.0 Bath Heparin 2000 RUA AVF Venofer 50/wk, no other meds  Problem/Plan 1. Acute L5 compression fracture - sec to fall 3/12./ Vertebroplasty per IR / Yesterday DID Tolerate Recliner HD  . 2. Pulmonary edema / hypoxemia - resolved 3. ESRD - HD on MWF @ [facility].HD today K+ 3.5this am/ hd in recliner in am  4. HTN/Volume - 1115/39 bp/ with low HR and Low bp  Metoprolol 6.25 bid stopped yesterday; lower EDW at dc. 5. Anemia - Hgb 11.4 Venofer qwk. No esa  6. Sec HPT - Ca 9.2(10.5 corrected), P 3.9 Fosrenol 1 g with meals./ no vit d / use low ca hd bath 7. Nutrition - Alb 2.3>2.4 renal carb diet/ renal vit/ breeze supplement 8. DM 2 - insulin per primary 9. Constipation = per admit team  [name], PA-C [place] [phone] 12/14/2014,8:43 AM  LOS: 12 days   Pt seen, examined and agree w A/P as above.  Kelly Splinter MD pager [phone]    cell [phone] 12/14/2014, 12:03 PM    Labs: Basic Metabolic Panel:  Recent Labs Lab 12/12/14 0728 12/13/14 0553 12/14/14 0553  NA 137 134* 137  K 3.6 4.1 3.5  CL 97 96 99  CO2 31 31  30   GLUCOSE 141* 162* 78  BUN 20 30* 16  CREATININE 3.71* 4.69* 3.20*  CALCIUM 9.1 9.1 9.2  PHOS 5.1* 5.7* 3.9   Liver Function Tests:  Recent Labs Lab 12/12/14 0728 12/13/14 0553 12/14/14 0553  ALBUMIN 2.5* 2.3* 2.4*  CBC:  Recent Labs Lab 12/08/14 0935 12/08/14 1330 12/11/14 0730  WBC 9.9 9.9 12.0*  NEUTROABS  --   --  7.5  HGB 11.8* 11.1* 11.4*  HCT 35.9* 34.7* 35.4*  MCV 95.5 95.9 95.2  PLT 262 273 240    CBG:  Recent Labs Lab 12/13/14 0803 12/13/14 1229 12/13/14 1904 12/13/14 2123 12/14/14 0819  GLUCAP 157* 125* 96 191* 70    Studies/Results: Dg Abd Portable 1v  12/13/2014   CLINICAL DATA:  Evaluated colonic contrast  EXAM: PORTABLE ABDOMEN - 1 VIEW  COMPARISON:  12/11/2014  FINDINGS: Persistent barium in the cecum and right colon. There is improvement in the retained barium in the transverse colon and left descending colon. Barium noted in the distal colon in the pelvis. Bones are osteopenic. Previous vertebral augmentation changes. No definite obstruction pattern. Heart is enlarged. Lung bases are clear.  IMPRESSION: Interval improvement in the retained colonic barium   Electronically Signed   By: Jerilynn Mages.  Shick  M.D.   On: 12/13/2014 18:59   Medications:   . amitriptyline  10 mg Oral QHS  . aspirin  81 mg Oral Daily  . atorvastatin  20 mg Oral q1800  . calcitonin (salmon)  1 spray Alternating Nares Daily  . febuxostat  40 mg Oral QHS  . feeding supplement (RESOURCE BREEZE)  1 Container Oral TID BM  . heparin  5,000 Units Subcutaneous 3 times per day  . insulin aspart  0-15 Units Subcutaneous TID WC  . insulin detemir  10 Units Subcutaneous QHS  . lanthanum  1,000 mg Oral TID WC  . loratadine  10 mg Oral Daily  . multivitamin  1 tablet Oral QHS  . pantoprazole sodium  40 mg Oral QHS  . polyethylene glycol  17 g Oral Daily  . senna-docusate  1 tablet Oral BID  . sodium chloride  3 mL Intravenous Q12H

## 2014-12-14 NOTE — Sedation Documentation (Signed)
dsg to 2 areas of lower back dry, intact,. Procedure done on L1 and L5

## 2014-12-14 NOTE — Progress Notes (Signed)
Received call from "[name]" inquiring if patient is going to be d/c'd after procedure and if insurance has approved "[facility]" for stay. Spoke with Lorriane Shire, SW and since patient has Medicare A& B as primary, prior approval is not required. Paged Dr. Alease Frame re: d/c plans. Will notify daughter, "[name]" when aware of d/c plans.

## 2014-12-15 LAB — RENAL FUNCTION PANEL
ANION GAP: 11 (ref 5–15)
Albumin: 2.4 g/dL — ABNORMAL LOW (ref 3.5–5.2)
BUN: 33 mg/dL — AB (ref 6–23)
CO2: 26 mmol/L (ref 19–32)
Calcium: 8.9 mg/dL (ref 8.4–10.5)
Chloride: 95 mmol/L — ABNORMAL LOW (ref 96–112)
Creatinine, Ser: 4.52 mg/dL — ABNORMAL HIGH (ref 0.50–1.10)
GFR calc non Af Amer: 8 mL/min — ABNORMAL LOW (ref >90)
GFR, EST AFRICAN AMERICAN: 9 mL/min — AB (ref >90)
Glucose, Bld: 403 mg/dL — ABNORMAL HIGH (ref 70–99)
PHOSPHORUS: 5.3 mg/dL — AB (ref 2.3–4.6)
Potassium: 3.4 mmol/L — ABNORMAL LOW (ref 3.5–5.1)
SODIUM: 132 mmol/L — AB (ref 135–145)

## 2014-12-15 LAB — GLUCOSE, CAPILLARY
GLUCOSE-CAPILLARY: 377 mg/dL — AB (ref 70–99)
Glucose-Capillary: 170 mg/dL — ABNORMAL HIGH (ref 70–99)
Glucose-Capillary: 368 mg/dL — ABNORMAL HIGH (ref 70–99)

## 2014-12-15 MED ORDER — TRAMADOL HCL 50 MG PO TABS: 50.0000 mg | ORAL_TABLET | Freq: Four times a day (QID) | ORAL | Status: DC | PRN

## 2014-12-15 NOTE — Progress Notes (Signed)
Family Medicine Teaching Service Daily Progress Note Intern Pager: [phone]  Patient name: [name] [name] Medical record number: [id number] Date of birth: Feb 16, 1926 Age: 81 y.o. Gender: female  Primary Care Provider: Ann Held, MD Consultants: ortho, cards, IR, Nephro  Code Status: FULL   Pt Overview and Major Events to Date:  3/12: admitted for fall, found to have an L5 compression fx  3/13: lumbar films ordered  3/15: MRI lumbar spine to eval for vertebroplasty; per ortho 3/16: Patient had dialysis in AM, barium in colon. SS enema ineffective 3/17: Patient started on Senna S BID 3/18: Milk and molasses enema  3/19: "Bowel Prep" 3/24: Underwent Kyphoplasty per IR  Assessment and Plan: [name] [name] is a 81 y.o. female presenting with fall. PMH is significant for Hx L2 fracture w/ previous kypophlasty, ESRD on HD since 2015, mild non-obstructive CAD on cath in 2008 with evidence of HOCM, DM2, HTN, osteoporosis, GERD and dCHF.   # Fall/L5 compression fx: still in pain. Pt w/ retained barium in bowel. Will not reassess this until clearance for kyphoplasty obtained. Patient failed conservative treatment with IV narcotics and calcitonin for pain management >> cleared by insurance for kyphoplasty >>percedure performed on 3/24.  - fentanyl 12.5 mcg IV q4 PRN for severe pain; tramadol for moderate pain, tylenol for mild pain - IR performed vertebroplasty on 3/24 - Continue Calcitonin nasally  - PT following patient - SW consulted for SNF placement which PT has recommended   - patient needs to be able to sit in a recliner before DC  - patient was able to sit up for HD 3/23  - Possible placement within next 24hrs  # Elevated Troponin/Demand ischemia: hx of having troponins elevated. No reports of SOB or chest pain.  - troponins had trended down, no further testing at this time   # Anemia of Chronic disease/MGUS: Has been seen by Dr. Marin Olp. Baseline hemoglobin 11.8  hematocrit 36.6. Blood studies showed a monoclonal spike of 0.18 g/dL. On a serum immunofixation, a IgG Kappa protein was noted. However, she had normal values of her immunoglobulins. - Monitor BMET   # Acute on chronic dCHF  - ECHO 60% EF, grade 1 diastolic dysfunction  # HOCM,HTN, HLD, CAD. Bradycardia noted today with soft BPs. - metoprolol 6.25 mg daily - held at this time   Medication important for HOCM so consider alternatives if not able to tolerate - continue lipitor   # DM2: controlled. Hgb A1c 6.4 (11/08/13). Controlled - Moderate SSI and back on home dose Levemir (10u QD)  # ESRD on HD: secondary to DM2. Dr. Posey Pronto is her nephrologist . HD on MWF - renal consulted  - HD today (3/25) - K 4.1  # Chronic dysphagia: patient with history of and a barium swallow study performed last year which showed esophageal dysmotility. EGD unremarkable  - protonix   FEN/GI: Renal diet, change to NPO when procedure approved, SLIV  PPx: heparin subQ   Disposition: SNF pending able to sit in a recliner vs able to get to HD in bed >> likely DC today/tomorrow if placement found.  Subjective:  Currently in HD. Feels better this AM. Pain is reported as more tolerable. Movement induces less pain. Grateful for the procedure.   Objective: Temp:  [97.3 F (36.3 C)-98.1 F (36.7 C)] 97.9 F (36.6 C) (03/25 0819) Pulse Rate:  [64-88] 69 (03/25 0958) Resp:  [16-25] 18 (03/25 0958) BP: (79-157)/(33-91) 118/48 mmHg (03/25 0958) SpO2:  [90 %-100 %] 90 % (  03/25 0819) Weight:  [138 lb 4.8 oz (62.732 kg)-142 lb 10.2 oz (64.7 kg)] 142 lb 10.2 oz (64.7 kg) (03/25 0819) Physical Exam: General: Elderly female resting in HD bed, awake, NAD HEENT: EOMI, PERRL, MMM Cardiovascular: RR, 3/6 systolic murmur heard  Respiratory: clear to auscultation, no increased WOB  Abdomen: S/NT/ND, hypoactive BS Extremities: moves all freely, distal pulses intact; point tenderness to the lumbar spine (L5) --  improved. Denies radiation at this time. Neuro: no gross deficits   Laboratory:  Recent Labs Lab 12/08/14 1330 12/11/14 0730  WBC 9.9 12.0*  HGB 11.1* 11.4*  HCT 34.7* 35.4*  PLT 273 240    Recent Labs Lab 12/13/14 0553 12/14/14 0553 12/15/14 0830  NA 134* 137 132*  K 4.1 3.5 3.4*  CL 96 99 95*  CO2 31 30 26   BUN 30* 16 33*  CREATININE 4.69* 3.20* 4.52*  CALCIUM 9.1 9.2 8.9  GLUCOSE 162* 78 403*     Imaging/Diagnostic Tests: KUB 3/21 IMPRESSION: Moderate amount of residual barium within the colon with mild decrease compared with 12/07/2014.  XR Abdomen 1 View COMPARISON: Portable abdominal film of December 06, 2014  IMPRESSION: There is considerable residual barium within the colon with evidence of some distal transit since yesterday's study.  Echocardiogram 3/15 Study Conclusions  - Left ventricle: Slight intracavitary gradient. No LVOT gradeint. The cavity size was normal. Wall thickness was increased in a pattern of moderate LVH. The estimated ejection fraction was 60%. Wall motion was normal; there were no regional wall motion abnormalities. Findings consistent with left ventricular diastolic dysfunction. Doppler parameters are consistent with high ventricular filling pressure. - Mitral valve: Moderate annular calcification, but no MS. Mild MR. - Left atrium: The atrium was moderately to severely dilated. - Right atrium: The atrium was mildly dilated. - Pulmonary arteries: PA peak pressure: 39 mm Hg (S).   Elberta Leatherwood, MD 12/15/2014, 10:26 AM [place] Intern pager: [phone], text pages welcome

## 2014-12-15 NOTE — Progress Notes (Signed)
Inpatient Diabetes Program Recommendations  AACE/ADA: New Consensus Statement on Inpatient Glycemic Control (2013)  Target Ranges:  Prepandial:   less than 140 mg/dL      Peak postprandial:   less than 180 mg/dL (1-2 hours)      Critically ill patients:  140 - 180 mg/dL   Results for NIDHI, JACOME (MRN [id number]) as of 12/15/2014 10:55  Ref. Range 12/14/2014 16:14 12/14/2014 21:17 12/14/2014 21:46 12/14/2014 22:08 12/15/2014 07:49  Glucose-Capillary Latest Range: 70-99 mg/dL 340 (H) 36 (LL) 62 (L) 113 (H) 377 (H)    Reason for assessment: elevated CBG  Diabetes history: Type 2   Current orders for Inpatient glycemic control: Levemir 10 units qday, Novolog moderate correction tid with meals  Given this patient's age, renal disease and reaction to Novolog insulin, please consider changing the Novolog correction to sensitive tid.   Gentry Fitz, RN, BA, MHA, CDE Diabetes Coordinator Inpatient Diabetes Program  [phone] (Team Pager) [phone] [facility]) 12/15/2014 11:24 AM

## 2014-12-15 NOTE — Progress Notes (Signed)
OT Cancellation Note  Patient Details Name: MILLEY VINING MRN: [id number] DOB: 05/25/1926   Cancelled Treatment:    Reason Eval/Treat Not Completed: Patient at procedure or test/ unavailable. Hemodialysis  Almon Register [id number] 12/15/2014, 8:07 AM

## 2014-12-15 NOTE — Discharge Instructions (Signed)
1.No stooping,bending or lifting more than 10 lbs for 2 weeks. 2.Use walker to ambulate  3.Follow up with your primary care doctor in 2 weeks

## 2014-12-15 NOTE — Progress Notes (Signed)
[facility] Progress Note  Assessment/Plan: 1. Acute L5 compression fx - secondary to fall 3/12 s/p vertebroplasty by IR - tolerated recliner 3/23 2. ESRD - MWF - was not put in recliner today because it was not ordered - on 4 K bath for low K 3.4 3. Anemia - CBC today pending 4. Secondary hyperparathyroidism - P 5.3 - on fosrenol  5. HTN/volume - ok BP dropping on HD up to 110 6. Nutrition - alb 2.4 - vitamin + supplemnents 7. Pul edema/hypoxia - resolved 8. Disp - working on d/c plan to NH - ok for d/c per renal 9. DM - BS variable  Myriam Jacobson, PA-C [facility] Beeper [phone] 12/15/2014,9:03 AM  LOS: 13 days   Pt seen, examined and agree w A/P as above.  Kelly Splinter MD pager [phone]    cell [phone] 12/15/2014, 10:53 AM    Subjective:   Started HD in January - Wed went ok in recliner - no BM x several days - like to use prune juice prn  Objective Filed Vitals:   12/15/14 0819 12/15/14 0824 12/15/14 0830 12/15/14 0840  BP: 123/48 114/48 79/36 89/33   Pulse: 73 66    Temp: 97.9 F (36.6 C)     TempSrc: Oral     Resp: 18  20 18   Height:      Weight: 64.7 kg (142 lb 10.2 oz)     SpO2: 90%      Physical Exam General: NAD Heart: RRR with ectopy - 2/6 murmur Lungs: no rales - breathing easily supine Abdomen: soft NT Extremities: no sig LE edema Dialysis Access: right AVF  Dialysis Orders: MWF [facility] .3h 65min 65 kg F160 400/A1.5 2/2.0 Bath Heparin 2000 RUA AVF Venofer 50/wk, no other meds  Additional Objective Labs: Basic Metabolic Panel:  Recent Labs Lab 12/13/14 0553 12/14/14 0553 12/15/14 0830  NA 134* 137 132*  K 4.1 3.5 3.4*  CL 96 99 95*  CO2 31 30 26   GLUCOSE 162* 78 403*  BUN 30* 16 33*  CREATININE 4.69* 3.20* 4.52*  CALCIUM 9.1 9.2 8.9  PHOS 5.7* 3.9 5.3*   Liver Function Tests:  Recent Labs Lab 12/13/14 0553 12/14/14 0553 12/15/14 0830  ALBUMIN 2.3* 2.4* 2.4*   CBC:  Recent  Labs Lab 12/08/14 0935 12/08/14 1330 12/11/14 0730  WBC 9.9 9.9 12.0*  NEUTROABS  --   --  7.5  HGB 11.8* 11.1* 11.4*  HCT 35.9* 34.7* 35.4*  MCV 95.5 95.9 95.2  PLT 262 273 240  CBG:  Recent Labs Lab 12/14/14 1614 12/14/14 2117 12/14/14 2146 12/14/14 2208 12/15/14 0749  GLUCAP 340* 36* 62* 113* 377*  Studies/Results: Dg Abd Portable 1v  12/13/2014   CLINICAL DATA:  Evaluated colonic contrast  EXAM: PORTABLE ABDOMEN - 1 VIEW  COMPARISON:  12/11/2014  FINDINGS: Persistent barium in the cecum and right colon. There is improvement in the retained barium in the transverse colon and left descending colon. Barium noted in the distal colon in the pelvis. Bones are osteopenic. Previous vertebral augmentation changes. No definite obstruction pattern. Heart is enlarged. Lung bases are clear.  IMPRESSION: Interval improvement in the retained colonic barium   Electronically Signed   By: Jerilynn Mages.  Shick M.D.   On: 12/13/2014 18:59   Medications:   . amitriptyline  10 mg Oral QHS  . aspirin  81 mg Oral Daily  . atorvastatin  20 mg Oral q1800  . calcitonin (salmon)  1 spray Alternating Nares Daily  .  febuxostat  40 mg Oral QHS  . feeding supplement (RESOURCE BREEZE)  1 Container Oral TID BM  . heparin  5,000 Units Subcutaneous 3 times per day  . insulin aspart  0-15 Units Subcutaneous TID WC  . insulin detemir  10 Units Subcutaneous QHS  . lanthanum  1,000 mg Oral TID WC  . loratadine  10 mg Oral Daily  . multivitamin  1 tablet Oral QHS  . pantoprazole sodium  40 mg Oral QHS  . polyethylene glycol  17 g Oral Daily  . senna-docusate  1 tablet Oral BID  . sodium chloride  3 mL Intravenous Q12H

## 2014-12-15 NOTE — Progress Notes (Signed)
Referring Physician(s): TS  Subjective:  Lumbar 1 and 5 vertebroplasty performed 3/24 Doing well In dialysis now States pain in back is less and able to move easier  Allergies: Review of patient's allergies indicates no known allergies.  Medications: Prior to Admission medications   Medication Sig Start Date End Date Taking? Authorizing Provider  acetaminophen (TYLENOL) 500 MG tablet Take 1,000 mg by mouth every 6 (six) hours as needed (pain).   Yes Historical Provider, MD  amitriptyline (ELAVIL) 10 MG tablet Take 10 mg by mouth at bedtime.    Yes Historical Provider, MD  aspirin EC 81 MG tablet Take 81 mg by mouth daily.   Yes Historical Provider, MD  atorvastatin (LIPITOR) 20 MG tablet Take 1 tablet (20 mg total) by mouth daily. 11/11/13  Yes Velvet Bathe, MD  b complex-vitamin c-folic acid (NEPHRO-VITE) 0.8 MG TABS tablet Take 1 tablet by mouth at bedtime.   Yes Historical Provider, MD  cyanocobalamin (,VITAMIN B-12,) 1000 MCG/ML injection Inject 1,000 mcg into the muscle every 30 (thirty) days.   Yes Historical Provider, MD  febuxostat (ULORIC) 40 MG tablet Take 40 mg by mouth at bedtime.   Yes Historical Provider, MD  fexofenadine (ALLEGRA) 180 MG tablet Take 180 mg by mouth daily as needed for allergies or rhinitis.   Yes Historical Provider, MD  fish oil-omega-3 fatty acids 1000 MG capsule Take 1 g by mouth daily.    Yes Historical Provider, MD  Glucosamine-Chondroitin 250-200 MG CAPS Take 1 capsule by mouth daily.     Yes Historical Provider, MD  insulin detemir (LEVEMIR) 100 UNIT/ML injection Inject 10 Units into the skin daily.   Yes Historical Provider, MD  insulin regular (NOVOLIN R,HUMULIN R) 100 units/mL injection Inject 0-12 Units into the skin 3 (three) times daily before meals. Per sliding scale: 0-150=0 units, 151-200 = 2 units, 201-250 = 4 units, 251-300 = 6 units, 301-350 = 8 units, 351-400 = 10 units, 401-450 = 12 units, > 450 CALL MD.   Yes Historical Provider,  MD  lansoprazole (PREVACID) 30 MG capsule Take 30 mg by mouth daily at 12 noon.   Yes Historical Provider, MD  lanthanum (FOSRENOL) 500 MG chewable tablet Chew 1,000 mg by mouth 3 (three) times daily with meals.    Yes Historical Provider, MD  metoprolol succinate (TOPROL XL) 25 MG 24 hr tablet Take 0.5 tablets (12.5 mg total) by mouth daily. 11/29/13  Yes Scott T Kathlen Mody, PA-C  nystatin (MYCOSTATIN/NYSTOP) 100000 UNIT/GM POWD Apply 1 g topically as needed (affected area).   Yes Historical Provider, MD  OxyCODONE HCl, Abuse Deter, 5 MG TABA Take 5 mg by mouth every 4 (four) hours as needed (pain).    Yes Historical Provider, MD  potassium chloride SA (K-DUR,KLOR-CON) 20 MEQ tablet Take 20 mEq by mouth daily.   Yes Historical Provider, MD  calcitRIOL (ROCALTROL) 0.25 MCG capsule Take 1 capsule (0.25 mcg total) by mouth daily. Patient not taking: Reported on 12/02/2014 10/14/13   Nishant Dhungel, MD  diazepam (VALIUM) 5 MG tablet Take 1 tablet (5 mg total) by mouth every 12 (twelve) hours as needed for anxiety. 11/11/13   Velvet Bathe, MD  feeding supplement, GLUCERNA SHAKE, (GLUCERNA SHAKE) LIQD Take 237 mLs by mouth 2 (two) times daily between meals. Patient not taking: Reported on 12/02/2014 11/11/13   Velvet Bathe, MD  phenol (CHLORASEPTIC) 1.4 % LIQD Use as directed 1 spray in the mouth or throat 3 (three) times daily as needed for throat  irritation / pain.    Historical Provider, MD  polyethylene glycol (MIRALAX / GLYCOLAX) packet Take 17 g by mouth at bedtime.    Historical Provider, MD  sodium bicarbonate 650 MG tablet Take 1 tablet (650 mg total) by mouth daily. Patient not taking: Reported on 12/02/2014 10/14/13   Nishant Dhungel, MD  traMADol (ULTRAM) 50 MG tablet Take 1 tablet (50 mg total) by mouth every 6 (six) hours as needed (pain). 11/11/13   Velvet Bathe, MD     Vital Signs: BP 117/46 mmHg  Pulse 70  Temp(Src) 97.9 F (36.6 C) (Oral)  Resp 18  Ht 5\' 1"  (1.549 m)  Wt 64.7 kg (142 lb  10.2 oz)  BMI 26.97 kg/m2  SpO2 90%  LMP  (LMP Unknown)  Physical Exam  Constitutional: She is oriented to person, place, and time.  Musculoskeletal: Normal range of motion.  Moving all 4s Back pain minimal  Neurological: She is alert and oriented to person, place, and time.  Skin: Skin is warm and dry.  Clean and dry sites of VP No bleeding No hematoma  Nursing note and vitals reviewed.   Imaging: Dg Abd 1 View  12/11/2014   CLINICAL DATA:  Constipated  EXAM: ABDOMEN - 1 VIEW  COMPARISON:  12/07/2014  FINDINGS: There is a moderate amount of retained barium within the colon. There is no bowel dilatation to suggest obstruction. There is no evidence of pneumoperitoneum, portal venous gas or pneumatosis. There are no pathologic calcifications along the expected course of the ureters.Prior L2 vertebral body augmentation. There is peripheral vascular atherosclerotic disease.  IMPRESSION: Moderate amount of residual barium within the colon with mild decrease compared with 12/07/2014.   Electronically Signed   By: Kathreen Devoid   On: 12/11/2014 16:17   Dg Abd Portable 1v  12/13/2014   CLINICAL DATA:  Evaluated colonic contrast  EXAM: PORTABLE ABDOMEN - 1 VIEW  COMPARISON:  12/11/2014  FINDINGS: Persistent barium in the cecum and right colon. There is improvement in the retained barium in the transverse colon and left descending colon. Barium noted in the distal colon in the pelvis. Bones are osteopenic. Previous vertebral augmentation changes. No definite obstruction pattern. Heart is enlarged. Lung bases are clear.  IMPRESSION: Interval improvement in the retained colonic barium   Electronically Signed   By: Jerilynn Mages.  Shick M.D.   On: 12/13/2014 18:59   Ir Vertebroplasty Lumobsacral Inj  12/15/2014   CLINICAL DATA:  Patient with severely painful compression fracture at L5 and L1.  EXAM: IR VERTEBROPLASTY LUMBOSACRAL INJ; IR VERTEBROPLASTY ADDL INJECTION  MEDICATIONS: Versed 1 mg IV, Fentanyl 25 mcg IV.   ANESTHESIA/SEDATION: Total Moderate Sedation Time:  30 minutes.  FLUOROSCOPY TIME:  17.7 minutes.  PROCEDURE: Following a full explanation of the procedure along with the potential associated complications, an informed witnessed consent was obtained.  The patient was placed prone on the fluoroscopic table. Nasal oxygen was administered. Physiologic monitoring was performed throughout the duration of the procedure. The skin overlying the lumbar region was prepped and draped in the usual sterile fashion. The L1 and L5 vertebral bodies were identified and the right pedicle at each level was infiltrated with 0.25% bupivacaine. This was then followed by the advancement of a 13-gauge Cook needle through the right pedicle into the anterior one-third at L1 and L5. A gentle contrast injection demonstrated a trabecular pattern of contrast.  At this time, methylmethacrylate mixture was reconstituted. Under biplane intermittent fluoroscopy, the methylmethacrylate was then injected into the  L1 and L5 vertebral body with filling of the vertebral body.  No extravasation was noted into the disk spaces or posteriorly into the spinal canal. No epidural venous contamination was seen.  The needle was then removed. Hemostasis was achieved at the skin entry site.  There were no acute complications. The patient tolerated the procedure well. The patient was returned to the floor in good condition.  IMPRESSION: Status post vertebral body augmentation for painful compression fracture at L1 and L5 using vertebroplasty technique.   Electronically Signed   By: Luanne Bras M.D.   On: 12/14/2014 16:04   Ir Vertebroplasty Add'l Injection  12/15/2014   CLINICAL DATA:  Patient with severely painful compression fracture at L5 and L1.  EXAM: IR VERTEBROPLASTY LUMBOSACRAL INJ; IR VERTEBROPLASTY ADDL INJECTION  MEDICATIONS: Versed 1 mg IV, Fentanyl 25 mcg IV.  ANESTHESIA/SEDATION: Total Moderate Sedation Time:  30 minutes.  FLUOROSCOPY TIME:   17.7 minutes.  PROCEDURE: Following a full explanation of the procedure along with the potential associated complications, an informed witnessed consent was obtained.  The patient was placed prone on the fluoroscopic table. Nasal oxygen was administered. Physiologic monitoring was performed throughout the duration of the procedure. The skin overlying the lumbar region was prepped and draped in the usual sterile fashion. The L1 and L5 vertebral bodies were identified and the right pedicle at each level was infiltrated with 0.25% bupivacaine. This was then followed by the advancement of a 13-gauge Cook needle through the right pedicle into the anterior one-third at L1 and L5. A gentle contrast injection demonstrated a trabecular pattern of contrast.  At this time, methylmethacrylate mixture was reconstituted. Under biplane intermittent fluoroscopy, the methylmethacrylate was then injected into the L1 and L5 vertebral body with filling of the vertebral body.  No extravasation was noted into the disk spaces or posteriorly into the spinal canal. No epidural venous contamination was seen.  The needle was then removed. Hemostasis was achieved at the skin entry site.  There were no acute complications. The patient tolerated the procedure well. The patient was returned to the floor in good condition.  IMPRESSION: Status post vertebral body augmentation for painful compression fracture at L1 and L5 using vertebroplasty technique.   Electronically Signed   By: Luanne Bras M.D.   On: 12/14/2014 16:04    Labs:  CBC:  Recent Labs  12/06/14 1150 12/08/14 0935 12/08/14 1330 12/11/14 0730  WBC 8.7 9.9 9.9 12.0*  HGB 10.9* 11.8* 11.1* 11.4*  HCT 34.8* 35.9* 34.7* 35.4*  PLT 303 262 273 240    COAGS:  Recent Labs  12/06/14 1219  INR 0.97  APTT 26    BMP:  Recent Labs  12/12/14 0728 12/13/14 0553 12/14/14 0553 12/15/14 0830  NA 137 134* 137 132*  K 3.6 4.1 3.5 3.4*  CL 97 96 99 95*  CO2 31  31 30 26   GLUCOSE 141* 162* 78 403*  BUN 20 30* 16 33*  CALCIUM 9.1 9.1 9.2 8.9  CREATININE 3.71* 4.69* 3.20* 4.52*  GFRNONAA 10* 8* 12* 8*  GFRAA 12* 9* 14* 9*    LIVER FUNCTION TESTS:  Recent Labs  12/12/14 0728 12/13/14 0553 12/14/14 0553 12/15/14 0830  ALBUMIN 2.5* 2.3* 2.4* 2.4*    Assessment and Plan:  L1 and L5 VP 3/24 Less back pain May sit in chair for dialysis as tolerated  Signed: Makesha Belitz A 12/15/2014, 9:48 AM   I spent a total of 15 Minutes in face to face in clinical  consultation/evaluation, greater than 50% of which was counseling/coordinating care for L1/L5 VP

## 2014-12-15 NOTE — Progress Notes (Signed)
PT Cancellation Note  Patient Details Name: [name] [name] MRN: [id number] DOB: 11-08-1925   Cancelled Treatment:    Reason Eval/Treat Not Completed: Patient at procedure or test/unavailable. Pt off unit in HD for the morning. Will continue to follow.   Rolinda Roan 12/15/2014, 9:22 AM   Rolinda Roan, PT, DPT Acute Rehabilitation Services Pager: [phone]

## 2014-12-21 ENCOUNTER — Other Ambulatory Visit (HOSPITAL_COMMUNITY): Payer: Self-pay | Admitting: Interventional Radiology

## 2014-12-21 DIAGNOSIS — IMO0002 Reserved for concepts with insufficient information to code with codable children: Secondary | ICD-10-CM

## 2014-12-21 DIAGNOSIS — M549 Dorsalgia, unspecified: Secondary | ICD-10-CM

## 2015-01-02 ENCOUNTER — Ambulatory Visit (HOSPITAL_COMMUNITY)
Admission: RE | Admit: 2015-01-02 | Discharge: 2015-01-02 | Disposition: A | Discharge status: Home / Self Care | Payer: Medicare Other | Source: Ambulatory Visit | Attending: Interventional Radiology | Admitting: Interventional Radiology

## 2015-01-02 DIAGNOSIS — IMO0002 Reserved for concepts with insufficient information to code with codable children: Secondary | ICD-10-CM

## 2015-01-02 DIAGNOSIS — M549 Dorsalgia, unspecified: Secondary | ICD-10-CM

## 2015-06-21 ENCOUNTER — Ambulatory Visit (INDEPENDENT_AMBULATORY_CARE_PROVIDER_SITE_OTHER): Payer: Medicare Other | Admitting: Cardiology

## 2015-06-21 VITALS — BP 162/67 | HR 79 | Ht 60.0 in | Wt 146.0 lb

## 2015-06-21 DIAGNOSIS — I5032 Chronic diastolic (congestive) heart failure: Secondary | ICD-10-CM | POA: Diagnosis not present

## 2015-06-21 NOTE — Patient Instructions (Signed)
Your physician wants you to follow-up in: 2 Years You will receive a reminder letter in the mail two months in advance. If you don't receive a letter, please call our office to schedule the follow-up appointment.  

## 2015-06-21 NOTE — Progress Notes (Signed)
HPI The patient returns to see me for evaluation of mild non-obstructive CAD on cath in 2008 with evidence of HOCM.  She was in the hospital in March after a fall.  I reviewed these records.  She did have an echo at that time  Prior to vertebroplasty. He demonstrated no change from her previous known hypertrophic cardiomyopathy. She gets dialysis. Of note she does have a fall in her blood pressure during dialysis and sometimes asked to have her dialysis session stopped.   She denies any chest pressure, neck or arm discomfort. She doesn't have any palpitations, presyncope or syncope. She denies any shortness of breath and has no PND or orthopnea. She's had falls as mentioned but no syncope>   No Known Allergies  Current Outpatient Prescriptions  Medication Sig Dispense Refill  . acetaminophen (TYLENOL) 500 MG tablet Take 1,000 mg by mouth every 6 (six) hours as needed (pain).    Marland Kitchen alendronate (FOSAMAX) 70 MG tablet Take 70 mg by mouth once a week. Take with a full glass of water on an empty stomach.    Marland Kitchen amitriptyline (ELAVIL) 10 MG tablet Take 10 mg by mouth at bedtime.     Marland Kitchen aspirin EC 81 MG tablet Take 81 mg by mouth daily.    Marland Kitchen atorvastatin (LIPITOR) 20 MG tablet Take 1 tablet (20 mg total) by mouth daily. 30 tablet 0  . b complex-vitamin c-folic acid (NEPHRO-VITE) 0.8 MG TABS tablet Take 1 tablet by mouth at bedtime.    . cyanocobalamin (,VITAMIN B-12,) 1000 MCG/ML injection Inject 1,000 mcg into the muscle every 30 (thirty) days.    Marland Kitchen docusate sodium (COLACE) 100 MG capsule Take 100 mg by mouth daily.    . febuxostat (ULORIC) 40 MG tablet Take 40 mg by mouth at bedtime.    . feeding supplement, GLUCERNA SHAKE, (GLUCERNA SHAKE) LIQD Take 237 mLs by mouth 2 (two) times daily between meals. 30 Can 0  . fexofenadine (ALLEGRA) 180 MG tablet Take 180 mg by mouth daily as needed for allergies or rhinitis.    . fish oil-omega-3 fatty acids 1000 MG capsule Take 1 g by mouth daily.     .  Glucosamine-Chondroitin 250-200 MG CAPS Take 1 capsule by mouth daily.      . insulin detemir (LEVEMIR) 100 UNIT/ML injection Inject 5 Units into the skin daily.     . insulin regular (NOVOLIN R,HUMULIN R) 100 units/mL injection Inject 0-12 Units into the skin 3 (three) times daily before meals. Per sliding scale: 0-150=0 units, 151-200 = 2 units, 201-250 = 4 units, 251-300 = 6 units, 301-350 = 8 units, 351-400 = 10 units, 401-450 = 12 units, > 450 CALL MD.    . lansoprazole (PREVACID) 30 MG capsule Take 30 mg by mouth daily at 12 noon.    Marland Kitchen lanthanum (FOSRENOL) 500 MG chewable tablet Chew 1,000 mg by mouth 3 (three) times daily with meals.     . nystatin (MYCOSTATIN/NYSTOP) 100000 UNIT/GM POWD Apply 1 g topically as needed (affected area).    . OxyCODONE HCl, Abuse Deter, 5 MG TABA Take 5 mg by mouth every 4 (four) hours as needed (pain).     . phenol (CHLORASEPTIC) 1.4 % LIQD Use as directed 1 spray in the mouth or throat 3 (three) times daily as needed for throat irritation / pain.    . polyethylene glycol (MIRALAX / GLYCOLAX) packet Take 17 g by mouth at bedtime.    . traMADol (ULTRAM) 50 MG  tablet Take 1 tablet (50 mg total) by mouth every 6 (six) hours as needed (pain). 30 tablet 0   No current facility-administered medications for this visit.    Past Medical History  Diagnosis Date  . Type 2 diabetes mellitus     mellitus? x25 years  . Essential hypertension   . Hypercholesterolemia   . ESRD (end stage renal disease)   . Anemia of chronic disease   . CAD (coronary artery disease)     Nonobstructive   . GERD (gastroesophageal reflux disease)   . HOCM (hypertrophic obstructive cardiomyopathy)     Past Surgical History  Procedure Laterality Date  . Total knee arthroplasty      L-2006; R-2008   . Appendectomy    . Transthoracic echocardiogram  05/2011  . Av fistula placement  07/07/2012    Procedure: ARTERIOVENOUS (AV) FISTULA CREATION;  Surgeon: Mal Misty, MD;  Location: [facility]  OR;  Service: Vascular;  Laterality: Right;  Brachial - cephalic arterivenous fistula  . Esophagogastroduodenoscopy (egd) with esophageal dilation N/A 10/12/2013    Procedure: ESOPHAGOGASTRODUODENOSCOPY (EGD) WITH ESOPHAGEAL DILATION;  Surgeon: Lafayette Dragon, MD;  Location: [facility] ENDOSCOPY;  Service: Endoscopy;  Laterality: N/A;  . Savory dilation N/A 10/12/2013    Procedure: SAVORY DILATION;  Surgeon: Lafayette Dragon, MD;  Location: [facility] ENDOSCOPY;  Service: Endoscopy;  Laterality: N/A;  . Back surgery    . Fracture surgery      ROS:  Bony pain.  Otherwise as stated in the HPI and negative for all other systems.  PHYSICAL EXAM BP 162/67 mmHg  Pulse 79  Ht 5' (1.524 m)  Wt 146 lb (66.225 kg)  BMI 28.51 kg/m2  LMP  (LMP Unknown) GEN:  No distress, frail NECK:  No jugular venous distention at 90 degrees, waveform within normal limits, carotid upstroke brisk and symmetric, no bruits, no thyromegaly LYMPHATICS:  No cervical adenopathy LUNGS:  Clear to auscultation bilaterally BACK:  No CVA tenderness CHEST:  Unremarkable HEART:  S1 and S2 within normal limits, no S3, no S4, no clicks, no rubs, apical systolic murmur increasing slightly with a strain phase of Valsalva, no diastolic murmurs ABD:  Positive bowel sounds normal in frequency in pitch, no bruits, no rebound, no guarding, unable to assess midline mass or bruit with the patient seated. EXT:  2 plus pulses throughout, moderate edema, no cyanosis no clubbing, right upper arm fistula SKIN:  No rashes no nodules, bruising  ASSESSMENT AND PLAN  Diastolic CHF: Volume managed by hemodialysis.   HOCM:    She seems to have no new symptoms. She had imaging in March. No change in therapy is indicated.  CAD: Minimal by cardiac catheterization in 2008. No symptoms of angina. No further imaging is indicated.  ESRD: She remains on Monday, Wednesday, Friday dialysis. I will defer followup of her labs to  Nephrology. Of note she does have blood  pressure. With dialysis but does not need to change in therapy.

## 2015-06-22 ENCOUNTER — Encounter: Payer: Self-pay | Admitting: Cardiology

## 2015-09-23 DIAGNOSIS — E162 Hypoglycemia, unspecified: Secondary | ICD-10-CM

## 2015-09-23 HISTORY — DX: Hypoglycemia, unspecified: E16.2

## 2015-10-10 ENCOUNTER — Observation Stay (HOSPITAL_COMMUNITY)
Admission: EM | Admit: 2015-10-10 | Discharge: 2015-10-11 | Disposition: A | Discharge status: Nursing Home (Includes ICF) | Payer: Medicare Other | Attending: Internal Medicine | Admitting: Internal Medicine

## 2015-10-10 ENCOUNTER — Encounter (HOSPITAL_COMMUNITY): Payer: Self-pay

## 2015-10-10 ENCOUNTER — Emergency Department (HOSPITAL_COMMUNITY): Payer: Medicare Other

## 2015-10-10 DIAGNOSIS — R7989 Other specified abnormal findings of blood chemistry: Secondary | ICD-10-CM

## 2015-10-10 DIAGNOSIS — N186 End stage renal disease: Secondary | ICD-10-CM | POA: Diagnosis present

## 2015-10-10 DIAGNOSIS — IMO0002 Reserved for concepts with insufficient information to code with codable children: Secondary | ICD-10-CM | POA: Diagnosis present

## 2015-10-10 DIAGNOSIS — E1122 Type 2 diabetes mellitus with diabetic chronic kidney disease: Secondary | ICD-10-CM | POA: Diagnosis not present

## 2015-10-10 DIAGNOSIS — E8770 Fluid overload, unspecified: Secondary | ICD-10-CM | POA: Diagnosis present

## 2015-10-10 DIAGNOSIS — E11649 Type 2 diabetes mellitus with hypoglycemia without coma: Principal | ICD-10-CM | POA: Insufficient documentation

## 2015-10-10 DIAGNOSIS — I10 Essential (primary) hypertension: Secondary | ICD-10-CM | POA: Diagnosis present

## 2015-10-10 DIAGNOSIS — I2510 Atherosclerotic heart disease of native coronary artery without angina pectoris: Secondary | ICD-10-CM | POA: Diagnosis not present

## 2015-10-10 DIAGNOSIS — E7800 Pure hypercholesterolemia, unspecified: Secondary | ICD-10-CM | POA: Diagnosis not present

## 2015-10-10 DIAGNOSIS — Z66 Do not resuscitate: Secondary | ICD-10-CM | POA: Insufficient documentation

## 2015-10-10 DIAGNOSIS — E162 Hypoglycemia, unspecified: Secondary | ICD-10-CM

## 2015-10-10 DIAGNOSIS — D539 Nutritional anemia, unspecified: Secondary | ICD-10-CM | POA: Diagnosis not present

## 2015-10-10 DIAGNOSIS — Z96653 Presence of artificial knee joint, bilateral: Secondary | ICD-10-CM | POA: Diagnosis not present

## 2015-10-10 DIAGNOSIS — E875 Hyperkalemia: Secondary | ICD-10-CM | POA: Diagnosis present

## 2015-10-10 DIAGNOSIS — I120 Hypertensive chronic kidney disease with stage 5 chronic kidney disease or end stage renal disease: Secondary | ICD-10-CM | POA: Insufficient documentation

## 2015-10-10 DIAGNOSIS — Z794 Long term (current) use of insulin: Secondary | ICD-10-CM | POA: Insufficient documentation

## 2015-10-10 DIAGNOSIS — Z7982 Long term (current) use of aspirin: Secondary | ICD-10-CM | POA: Insufficient documentation

## 2015-10-10 DIAGNOSIS — E1165 Type 2 diabetes mellitus with hyperglycemia: Secondary | ICD-10-CM

## 2015-10-10 DIAGNOSIS — D631 Anemia in chronic kidney disease: Secondary | ICD-10-CM | POA: Diagnosis not present

## 2015-10-10 DIAGNOSIS — E1129 Type 2 diabetes mellitus with other diabetic kidney complication: Secondary | ICD-10-CM | POA: Diagnosis present

## 2015-10-10 DIAGNOSIS — Z993 Dependence on wheelchair: Secondary | ICD-10-CM | POA: Diagnosis not present

## 2015-10-10 DIAGNOSIS — J189 Pneumonia, unspecified organism: Secondary | ICD-10-CM

## 2015-10-10 DIAGNOSIS — E8779 Other fluid overload: Secondary | ICD-10-CM

## 2015-10-10 DIAGNOSIS — R778 Other specified abnormalities of plasma proteins: Secondary | ICD-10-CM | POA: Diagnosis present

## 2015-10-10 DIAGNOSIS — Z992 Dependence on renal dialysis: Secondary | ICD-10-CM | POA: Diagnosis not present

## 2015-10-10 HISTORY — DX: Hypoglycemia, unspecified: E16.2

## 2015-10-10 LAB — BASIC METABOLIC PANEL
Anion gap: 13 (ref 5–15)
Anion gap: 10 (ref 5–15)
BUN: 37 mg/dL — AB (ref 6–20)
BUN: 40 mg/dL — ABNORMAL HIGH (ref 6–20)
CALCIUM: 9.1 mg/dL (ref 8.9–10.3)
CO2: 29 mmol/L (ref 22–32)
CO2: 29 mmol/L (ref 22–32)
Calcium: 8.8 mg/dL — ABNORMAL LOW (ref 8.9–10.3)
Chloride: 93 mmol/L — ABNORMAL LOW (ref 101–111)
Chloride: 95 mmol/L — ABNORMAL LOW (ref 101–111)
Creatinine, Ser: 4.21 mg/dL — ABNORMAL HIGH (ref 0.44–1.00)
Creatinine, Ser: 5.08 mg/dL — ABNORMAL HIGH (ref 0.44–1.00)
GFR calc Af Amer: 10 mL/min — ABNORMAL LOW (ref >60)
GFR calc Af Amer: 8 mL/min — ABNORMAL LOW (ref >60)
GFR calc non Af Amer: 9 mL/min — ABNORMAL LOW (ref >60)
GFR calc non Af Amer: 7 mL/min — ABNORMAL LOW (ref >60)
Glucose, Bld: 101 mg/dL — ABNORMAL HIGH (ref 65–99)
Glucose, Bld: 211 mg/dL — ABNORMAL HIGH (ref 65–99)
Potassium: 6 mmol/L — ABNORMAL HIGH (ref 3.5–5.1)
Potassium: 6 mmol/L — ABNORMAL HIGH (ref 3.5–5.1)
SODIUM: 135 mmol/L (ref 135–145)
Sodium: 134 mmol/L — ABNORMAL LOW (ref 135–145)

## 2015-10-10 LAB — CBC WITH DIFFERENTIAL/PLATELET
Basophils Absolute: 0.1 10*3/uL (ref 0.0–0.1)
Basophils Relative: 1 %
EOS ABS: 0.3 10*3/uL (ref 0.0–0.7)
EOS PCT: 4 %
HCT: 34.1 % — ABNORMAL LOW (ref 36.0–46.0)
Hemoglobin: 10.9 g/dL — ABNORMAL LOW (ref 12.0–15.0)
Lymphocytes Relative: 16 %
Lymphs Abs: 1.1 10*3/uL (ref 0.7–4.0)
MCH: 30.4 pg (ref 26.0–34.0)
MCHC: 32 g/dL (ref 30.0–36.0)
MCV: 95.3 fL (ref 78.0–100.0)
MONOS PCT: 10 %
Monocytes Absolute: 0.7 10*3/uL (ref 0.1–1.0)
Neutro Abs: 4.9 10*3/uL (ref 1.7–7.7)
Neutrophils Relative %: 69 %
PLATELETS: 220 10*3/uL (ref 150–400)
RBC: 3.58 MIL/uL — ABNORMAL LOW (ref 3.87–5.11)
RDW: 14.4 % (ref 11.5–15.5)
WBC: 7.1 10*3/uL (ref 4.0–10.5)

## 2015-10-10 LAB — GLUCOSE, CAPILLARY
GLUCOSE-CAPILLARY: 118 mg/dL — AB (ref 65–99)
GLUCOSE-CAPILLARY: 133 mg/dL — AB (ref 65–99)
Glucose-Capillary: 120 mg/dL — ABNORMAL HIGH (ref 65–99)
Glucose-Capillary: 191 mg/dL — ABNORMAL HIGH (ref 65–99)

## 2015-10-10 LAB — TROPONIN I
TROPONIN I: 0.36 ng/mL — AB (ref <0.031)
Troponin I: 0.42 ng/mL — ABNORMAL HIGH (ref <0.031)

## 2015-10-10 LAB — CBG MONITORING, ED
Glucose-Capillary: 92 mg/dL (ref 65–99)
Glucose-Capillary: 124 mg/dL — ABNORMAL HIGH (ref 65–99)

## 2015-10-10 LAB — MRSA PCR SCREENING: MRSA by PCR: NEGATIVE

## 2015-10-10 MED ORDER — INSULIN ASPART 100 UNIT/ML ~~LOC~~ SOLN: 0.0000 [IU] | Freq: Three times a day (TID) | SUBCUTANEOUS | Status: DC

## 2015-10-10 MED ORDER — HEPARIN SODIUM (PORCINE) 5000 UNIT/ML IJ SOLN
5000.0000 [IU] | Freq: Three times a day (TID) | INTRAMUSCULAR | Status: DC
  Administered 2015-10-10 – 2015-10-11 (×3): 5000 [IU] via SUBCUTANEOUS
  Filled 2015-10-10 (×2): qty 1

## 2015-10-10 MED ORDER — LIDOCAINE-PRILOCAINE 2.5-2.5 % EX CREA
1.0000 "application " | TOPICAL_CREAM | CUTANEOUS | Status: DC | PRN
  Filled 2015-10-10: qty 5

## 2015-10-10 MED ORDER — INSULIN DETEMIR 100 UNIT/ML ~~LOC~~ SOLN
3.0000 [IU] | Freq: Every day | SUBCUTANEOUS | Status: DC
  Administered 2015-10-10 – 2015-10-11 (×2): 3 [IU] via SUBCUTANEOUS
  Filled 2015-10-10 (×2): qty 0.03

## 2015-10-10 MED ORDER — LANTHANUM CARBONATE 500 MG PO CHEW
1000.0000 mg | CHEWABLE_TABLET | Freq: Three times a day (TID) | ORAL | Status: DC
  Administered 2015-10-10 – 2015-10-11 (×3): 1000 mg via ORAL
  Filled 2015-10-10 (×7): qty 2

## 2015-10-10 MED ORDER — SODIUM CHLORIDE 0.9 % IV SOLN: 100.0000 mL | INTRAVENOUS | Status: DC | PRN

## 2015-10-10 MED ORDER — ATORVASTATIN CALCIUM 20 MG PO TABS
20.0000 mg | ORAL_TABLET | Freq: Every day | ORAL | Status: DC
  Administered 2015-10-10 – 2015-10-11 (×2): 20 mg via ORAL
  Filled 2015-10-10: qty 1
  Filled 2015-10-10: qty 2

## 2015-10-10 MED ORDER — GLUCERNA SHAKE PO LIQD
237.0000 mL | Freq: Two times a day (BID) | ORAL | Status: DC
  Administered 2015-10-11: 237 mL via ORAL
  Filled 2015-10-10: qty 237

## 2015-10-10 MED ORDER — HEPARIN SODIUM (PORCINE) 1000 UNIT/ML DIALYSIS
1320.0000 [IU] | INTRAMUSCULAR | Status: DC | PRN
  Filled 2015-10-10: qty 2

## 2015-10-10 MED ORDER — RENA-VITE PO TABS
1.0000 | ORAL_TABLET | Freq: Every day | ORAL | Status: DC
  Administered 2015-10-10: 1 via ORAL
  Filled 2015-10-10: qty 1

## 2015-10-10 MED ORDER — LIDOCAINE HCL (PF) 1 % IJ SOLN: 5.0000 mL | INTRAMUSCULAR | Status: DC | PRN

## 2015-10-10 MED ORDER — ALBUTEROL SULFATE HFA 108 (90 BASE) MCG/ACT IN AERS: 2.0000 | INHALATION_SPRAY | RESPIRATORY_TRACT | Status: DC | PRN

## 2015-10-10 MED ORDER — NA FERRIC GLUC CPLX IN SUCROSE 12.5 MG/ML IV SOLN
62.5000 mg | INTRAVENOUS | Status: DC
  Administered 2015-10-10: 62.5 mg via INTRAVENOUS
  Filled 2015-10-10: qty 5

## 2015-10-10 MED ORDER — MAGNESIUM HYDROXIDE 400 MG/5ML PO SUSP: 30.0000 mL | Freq: Every day | ORAL | Status: DC | PRN

## 2015-10-10 MED ORDER — ALTEPLASE 2 MG IJ SOLR
2.0000 mg | Freq: Once | INTRAMUSCULAR | Status: DC | PRN
  Filled 2015-10-10: qty 2

## 2015-10-10 MED ORDER — CINACALCET HCL 30 MG PO TABS
30.0000 mg | ORAL_TABLET | Freq: Every day | ORAL | Status: DC
  Administered 2015-10-11: 30 mg via ORAL
  Filled 2015-10-10 (×3): qty 1

## 2015-10-10 MED ORDER — HEPARIN SODIUM (PORCINE) 1000 UNIT/ML DIALYSIS
1000.0000 [IU] | INTRAMUSCULAR | Status: DC | PRN
  Filled 2015-10-10: qty 1

## 2015-10-10 MED ORDER — PENTAFLUOROPROP-TETRAFLUOROETH EX AERO: 1.0000 "application " | INHALATION_SPRAY | CUTANEOUS | Status: DC | PRN

## 2015-10-10 MED ORDER — SODIUM CHLORIDE 0.9 % IJ SOLN
3.0000 mL | Freq: Two times a day (BID) | INTRAMUSCULAR | Status: DC
  Administered 2015-10-10 – 2015-10-11 (×2): 3 mL via INTRAVENOUS

## 2015-10-10 MED ORDER — DARBEPOETIN ALFA 60 MCG/0.3ML IJ SOSY
60.0000 ug | PREFILLED_SYRINGE | INTRAMUSCULAR | Status: DC
  Filled 2015-10-10: qty 0.3

## 2015-10-10 MED ORDER — AMITRIPTYLINE HCL 10 MG PO TABS
10.0000 mg | ORAL_TABLET | Freq: Every day | ORAL | Status: DC
  Administered 2015-10-10: 10 mg via ORAL
  Filled 2015-10-10 (×2): qty 1

## 2015-10-10 MED ORDER — SODIUM POLYSTYRENE SULFONATE 15 GM/60ML PO SUSP
30.0000 g | Freq: Once | ORAL | Status: AC
  Administered 2015-10-10: 30 g via ORAL
  Filled 2015-10-10: qty 120

## 2015-10-10 MED ORDER — ASPIRIN EC 81 MG PO TBEC
81.0000 mg | DELAYED_RELEASE_TABLET | Freq: Every day | ORAL | Status: DC
  Administered 2015-10-10 – 2015-10-11 (×2): 81 mg via ORAL
  Filled 2015-10-10 (×2): qty 1

## 2015-10-10 MED ORDER — ACETAMINOPHEN 325 MG PO TABS: 650.0000 mg | ORAL_TABLET | Freq: Four times a day (QID) | ORAL | Status: DC | PRN

## 2015-10-10 MED ORDER — CALCITRIOL 0.5 MCG PO CAPS: 0.5000 ug | ORAL_CAPSULE | ORAL | Status: DC

## 2015-10-10 MED ORDER — DOCUSATE SODIUM 100 MG PO CAPS
100.0000 mg | ORAL_CAPSULE | Freq: Every day | ORAL | Status: DC
  Administered 2015-10-10 – 2015-10-11 (×2): 100 mg via ORAL
  Filled 2015-10-10 (×2): qty 1

## 2015-10-10 MED ORDER — GUAIFENESIN 100 MG/5ML PO SOLN
200.0000 mg | Freq: Four times a day (QID) | ORAL | Status: DC | PRN
Start: 2015-10-10 — End: 2015-10-11
  Administered 2015-10-10: 200 mg via ORAL
  Filled 2015-10-10 (×2): qty 10

## 2015-10-10 MED ORDER — ONDANSETRON HCL 4 MG/2ML IJ SOLN
4.0000 mg | Freq: Once | INTRAMUSCULAR | Status: AC
  Administered 2015-10-10: 4 mg via INTRAVENOUS
  Filled 2015-10-10: qty 2

## 2015-10-10 MED ORDER — FEBUXOSTAT 40 MG PO TABS
40.0000 mg | ORAL_TABLET | Freq: Every day | ORAL | Status: DC
  Administered 2015-10-10: 40 mg via ORAL
  Filled 2015-10-10: qty 1

## 2015-10-10 MED ORDER — PANTOPRAZOLE SODIUM 20 MG PO TBEC
20.0000 mg | DELAYED_RELEASE_TABLET | Freq: Every day | ORAL | Status: DC
  Administered 2015-10-10 – 2015-10-11 (×2): 20 mg via ORAL
  Filled 2015-10-10 (×2): qty 1

## 2015-10-10 MED ORDER — ONDANSETRON HCL 4 MG PO TABS: 8.0000 mg | ORAL_TABLET | Freq: Three times a day (TID) | ORAL | Status: DC | PRN

## 2015-10-10 NOTE — ED Notes (Signed)
O2 applied via nasal cannula.

## 2015-10-10 NOTE — Progress Notes (Signed)
Pt. MRSA PCR negative. Isolation discontinued.

## 2015-10-10 NOTE — H&P (Signed)
History and Physical  Patient Name: [name] [name]     [id number]    DOB: Apr 02, 1926    DOA: 10/10/2015 Referring physician: Malachy Moan, MD PCP: Ann Held, MD      Chief Complaint: Hypoglycemia  HPI: [name] [name] is a 82 y.o. female with a past medical history significant for ESRD on HD MWF, HTN, IDDM, and malnutrition who presents with hypogylcemia  The patient has been getting over a respiratory infection for the last two weeks (this started 2-3 weeks ago and was initially treated with albuterol, Zpak, and Robitussin, but has not improved significantly in the last two weeks).  Also, during this time, she has had labile BG.  Review of BG log from her nursing home shows numerous sugars as low as 65 mg/dL but also as high as 400 mg/dL.  She uses levemir low dose nightly as well as sliding scale corrections at lunch.    Today at lunch, her BG was 84, and so she got her scheduled 1U insulin.  At supper, she again got correction insulin, but did not eat much.  At 10p, she was sweaty and so nursing checked her BG and it was in the 40s, and so EMS were called, but the patient's daughter asked them not to transport her immediately.  They administered glucose and a small meal and her BG rose to 100.  At midnight, it was checked again and her BG was 65 mg/dL, and so the patient's daughter asked for her to be sent to the ER>  In the ED, the patient had hyperkalemia without ECG changes.  Her BG was stable at 100 mg/dL.  She had a CXR with what was read as venous congestion versus pneumonia.  She had no fever, tachycardia, hypoxia, or leukocytosis.  TRH were asked to evaluate for admission.     Review of Systems:  Pt complains of hypoglycemia, sweating, cough. Pt denies any fever, chills, sputum.  All other systems negative except as just noted or noted in the history of present illness.  No Known Allergies  Prior to Admission medications   Medication Sig Start Date End Date Taking?  Authorizing Provider  acetaminophen (TYLENOL) 500 MG tablet Take 1,000 mg by mouth every 6 (six) hours as needed (pain).   Yes Historical Provider, MD  albuterol (PROVENTIL HFA;VENTOLIN HFA) 108 (90 Base) MCG/ACT inhaler Inhale 2 puffs into the lungs every 4 (four) hours as needed for wheezing or shortness of breath.   Yes Historical Provider, MD  alendronate (FOSAMAX) 70 MG tablet Take 70 mg by mouth once a week. Take with a full glass of water on an empty stomach.   Yes Historical Provider, MD  amitriptyline (ELAVIL) 10 MG tablet Take 10 mg by mouth at bedtime.    Yes Historical Provider, MD  aspirin EC 81 MG tablet Take 81 mg by mouth daily.   Yes Historical Provider, MD  atorvastatin (LIPITOR) 20 MG tablet Take 1 tablet (20 mg total) by mouth daily. 11/11/13  Yes Velvet Bathe, MD  b complex-vitamin c-folic acid (NEPHRO-VITE) 0.8 MG TABS tablet Take 1 tablet by mouth at bedtime.   Yes Historical Provider, MD  cinacalcet (SENSIPAR) 30 MG tablet Take 30 mg by mouth daily with supper.   Yes Historical Provider, MD  docusate sodium (COLACE) 100 MG capsule Take 100 mg by mouth daily.   Yes Historical Provider, MD  febuxostat (ULORIC) 40 MG tablet Take 40 mg by mouth at bedtime.   Yes Historical Provider,  MD  feeding supplement, GLUCERNA SHAKE, (GLUCERNA SHAKE) LIQD Take 237 mLs by mouth 2 (two) times daily between meals. 11/11/13  Yes Velvet Bathe, MD  fexofenadine (ALLEGRA) 180 MG tablet Take 180 mg by mouth daily as needed for allergies or rhinitis.   Yes Historical Provider, MD  fish oil-omega-3 fatty acids 1000 MG capsule Take 1 g by mouth daily.    Yes Historical Provider, MD  Glucosamine-Chondroitin 250-200 MG CAPS Take 1 capsule by mouth daily.     Yes Historical Provider, MD  guaiFENesin (ROBITUSSIN) 100 MG/5ML liquid Take 200 mg by mouth every 6 (six) hours as needed for cough.   Yes Historical Provider, MD  insulin detemir (LEVEMIR) 100 UNIT/ML injection Inject 5 Units into the skin daily.     Yes Historical Provider, MD  insulin regular (NOVOLIN R,HUMULIN R) 100 units/mL injection Inject 0-12 Units into the skin 3 (three) times daily before meals. Per sliding scale: 0-150=0 units, 151-200 = 2 units, 201-250 = 4 units, 251-300 = 6 units, 301-350 = 8 units, 351-400 = 10 units, 401-450 = 12 units, > 450 CALL MD.   Yes Historical Provider, MD  lansoprazole (PREVACID) 30 MG capsule Take 30 mg by mouth daily at 12 noon.   Yes Historical Provider, MD  lanthanum (FOSRENOL) 500 MG chewable tablet Chew 1,000 mg by mouth 3 (three) times daily with meals.    Yes Historical Provider, MD  magnesium hydroxide (MILK OF MAGNESIA) 400 MG/5ML suspension Take 30 mLs by mouth daily as needed for mild constipation.   Yes Historical Provider, MD  nystatin (MYCOSTATIN/NYSTOP) 100000 UNIT/GM POWD Apply 1 g topically every 8 (eight) hours as needed (yeastlike areas).    Yes Historical Provider, MD  ondansetron (ZOFRAN) 8 MG tablet Take 8 mg by mouth 3 (three) times daily as needed for nausea or vomiting.   Yes Historical Provider, MD  OxyCODONE HCl, Abuse Deter, 5 MG TABA Take 5 mg by mouth every 4 (four) hours as needed (pain).    Yes Historical Provider, MD  phenol (CHLORASEPTIC) 1.4 % LIQD Use as directed 1 spray in the mouth or throat 3 (three) times daily as needed for throat irritation / pain.   Yes Historical Provider, MD  polyethylene glycol (MIRALAX / GLYCOLAX) packet Take 17 g by mouth at bedtime.   Yes Historical Provider, MD  polyvinyl alcohol (LIQUIFILM TEARS) 1.4 % ophthalmic solution Place 2 drops into both eyes 4 (four) times daily.   Yes Historical Provider, MD  traMADol (ULTRAM) 50 MG tablet Take 1 tablet (50 mg total) by mouth every 6 (six) hours as needed (pain). Patient taking differently: Take 50 mg by mouth 4 (four) times daily as needed (pain).  11/11/13  Yes Velvet Bathe, MD  vitamin B-12 (CYANOCOBALAMIN) 1000 MCG tablet Take 1,000 mcg by mouth daily.   Yes Historical Provider, MD    Past  Medical History  Diagnosis Date  . Type 2 diabetes mellitus (HCC)     mellitus? x25 years  . Essential hypertension   . Hypercholesterolemia   . ESRD (end stage renal disease) ([place])   . Anemia of chronic disease   . CAD (coronary artery disease)     Nonobstructive   . GERD (gastroesophageal reflux disease)   . HOCM (hypertrophic obstructive cardiomyopathy) [facility])     Past Surgical History  Procedure Laterality Date  . Total knee arthroplasty      L-2006; R-2008   . Appendectomy    . Transthoracic echocardiogram  05/2011  .  Av fistula placement  07/07/2012    Procedure: ARTERIOVENOUS (AV) FISTULA CREATION;  Surgeon: Mal Misty, MD;  Location: [facility] OR;  Service: Vascular;  Laterality: Right;  Brachial - cephalic arterivenous fistula  . Esophagogastroduodenoscopy (egd) with esophageal dilation N/A 10/12/2013    Procedure: ESOPHAGOGASTRODUODENOSCOPY (EGD) WITH ESOPHAGEAL DILATION;  Surgeon: Lafayette Dragon, MD;  Location: [facility] ENDOSCOPY;  Service: Endoscopy;  Laterality: N/A;  . Savory dilation N/A 10/12/2013    Procedure: SAVORY DILATION;  Surgeon: Lafayette Dragon, MD;  Location: [facility] ENDOSCOPY;  Service: Endoscopy;  Laterality: N/A;  . Back surgery    . Fracture surgery      Family history: family history includes Heart attack in her father; Stroke in her mother.  Social History: Patient lives in assisted living.  She is not a smoker.  She is wheelchair bound.  Her daughter is her POA.     Physical Exam: BP 177/54 mmHg  Pulse 83  Temp(Src) 98.3 F (36.8 C) (Oral)  Resp 25  SpO2 94%  LMP  (LMP Unknown) General appearance: Obese elderly female, alert and in no acute distress.   Eyes: Anicteric, conjunctiva pink, lids and lashes normal.     ENT: No nasal deformity, discharge, or epistaxis.  OP moist without lesions.   Lymph: No cervical, supraclavicular lymphadenopathy. Skin: Warm and dry.  Pale. Cardiac: RRR, nl [id number], systolic murmur appreciated.  Capillary refill is brisk.  No  distended neck veins.  No LE edema.   Respiratory: Normal respiratory rate and rhythm.  Without rales or wheezes.  Scattered coarse airway sounds. Abdomen: Abdomen soft without rigidity.  No TTP. No ascites, distension.   MSK: No deformities or effusions. Neuro: Sensorium intact and responding to questions, attention normal.  Speech is fluent.  Moves all extremities equally and with normal coordination.    Psych: Behavior appropriate.  Affect normal.  No evidence of aural or visual hallucinations or delusions.       Labs on Admission:  The metabolic panel shows hyperkalemia, BG 92. Troponin chronicall elevated. The complete blood count shows chronic anemia.   Radiological Exams on Admission: Personally reviewed: Dg Chest 2 View 10/10/2015  CLINICAL DATA:  Acute onset of shortness of breath and cough. Hypoglycemia. Initial encounter. EXAM: CHEST  2 VIEW COMPARISON:  Chest radiograph performed 12/02/2014 FINDINGS: The lungs are mildly hypoexpanded. Vascular congestion is noted, with increased interstitial markings, concerning for mild pulmonary edema. Pneumonia could have a similar appearance. Small bilateral pleural effusions are seen. No pneumothorax is seen. There is mild lateral pleural thickening bilaterally. The heart is mildly enlarged. No acute osseous abnormalities are seen. IMPRESSION: Lungs mildly hypoexpanded. Vascular congestion and mild cardiomegaly, with increased interstitial markings, concerning for mild pulmonary edema. Pneumonia could have a similar appearance. Small bilateral pleural effusions noted. Electronically Signed   By: Garald Balding M.D.   On: 10/10/2015 02:52    EKG: Independently reviewed. Sinus, rate 71.  QTc 440.  Old TWI in inferolateral leads, unchanged from previous.    Assessment/Plan 1. Hypoglycemia in IDDM:  Suspect this is due to poor/inconsistent intake and aggressive sliding scale insulin.      -Reduce Levemir -Check BG TID AC and HS -Very low  dose custom sliding scale with meals; a carb-ratio insulin dosing with meals would be preferable, given patient's variable intake -Consult to diabetes education   2. Fluid overload:  CXR with ?fluid overload and patient with worsening cough.  Due for dialysis today.  There was a question of pneumonia  on CXR, but I doubt bacterial PNA given recent treatment with azithromycin, atypical appearance on CXR, and likelihood that venous congestion from fluid overload better explains these opacities.  3. ESRD on HD MWF:  -Consult to nephrology for maintenance HD, appreciate recommendations  4. Elevated troponin:  Chronically elevated. -Repeat once, if stable, discontinue.  5. Hyperkalemia:  Kayexalate given. -HD per nephrology  6. Anemia of renal disease:  Stable.       DVT PPx: Heparin Diet: Renal Consultants: Nephrology Code Status: DO NOT RESUSCITATE Family Communication: Daughter, present at bedside  Medical decision making: What exists of the patient's previous chart was reviewed in depth and the case was discussed with Dr. Betsey Holiday. Patient seen 5:35 AM on 10/10/2015.  Disposition Plan:   Place patient in observation for low blood glucose and fluid overload and hyperkalemia.  HD per nephrology.  Anticipate observation overnight and discharge tomrorow.      Edwin Dada [facility] Pager [phone]

## 2015-10-10 NOTE — Consult Note (Signed)
[facility] Renal Consultation Note    Indication for Consultation:  Management of ESRD/hemodialysis; anemia, hypertension/volume and secondary hyperparathyroidism PCP: Ann Held, MD   HPI: [name] [name] is a 82 y.o. female with ESRD secondary to DM/HTN on HD since February 2015 with a hisotry of hypertorphic obstructive CM, hx esophageal stricture (1/15 last dilitation), prior L2 compression fracture, nonobstructive CAD who presented with hypoglycemia.  She started having issues with low BS yesterday at her residential facility that did not improve with conservative measures and food intake so she was subsequently sent to the ED for evaluation.   She had a recent URI and was treated with a Zpak  [name] [name] regularly attends her HD treatments, runs her full time and gets close to her EDW of 68.5 - Average UF net volumes are 1.5 - 2.5 L with post BPs in the 100 - 110 range sitting (nonambulatory). Her last Hgb A1C was 7.5 10/26. She is due for dialysis today. Labs in the ED showed K 6 that was treated with kayexalate, no peaked t waves on EKG. CXR showed vasc congestion ? Pulmonary edema. She is afebrile and WBC is normal.  According to the pt's daughter she vomited yesterday evening after eating slaw and BBQ.  The pt says she vomits when phlegm gets hung up and she can't clear. Had small ? Vomiting this am after eating breakfast.She was MRSA PCR neg in March.  Daughter wonders why she is on contact precautions.  Pt has problems at dialysis which she breaks out in a sweat all over and her hair gets wet.  She thinks it may be associated with BP drop. AT present she is not SOB, has not nausea.  Past Medical History  Diagnosis Date  . Type 2 diabetes mellitus (HCC)     mellitus? x25 years  . Essential hypertension   . Hypercholesterolemia   . ESRD (end stage renal disease) ([place])   . Anemia of chronic disease   . CAD (coronary artery disease)     Nonobstructive   . GERD  (gastroesophageal reflux disease)   . HOCM (hypertrophic obstructive cardiomyopathy) [facility])    Past Surgical History  Procedure Laterality Date  . Total knee arthroplasty      L-2006; R-2008   . Appendectomy    . Transthoracic echocardiogram  05/2011  . Av fistula placement  07/07/2012    Procedure: ARTERIOVENOUS (AV) FISTULA CREATION;  Surgeon: Mal Misty, MD;  Location: [facility] OR;  Service: Vascular;  Laterality: Right;  Brachial - cephalic arterivenous fistula  . Esophagogastroduodenoscopy (egd) with esophageal dilation N/A 10/12/2013    Procedure: ESOPHAGOGASTRODUODENOSCOPY (EGD) WITH ESOPHAGEAL DILATION;  Surgeon: Lafayette Dragon, MD;  Location: [facility] ENDOSCOPY;  Service: Endoscopy;  Laterality: N/A;  . Savory dilation N/A 10/12/2013    Procedure: SAVORY DILATION;  Surgeon: Lafayette Dragon, MD;  Location: [facility] ENDOSCOPY;  Service: Endoscopy;  Laterality: N/A;  . Back surgery    . Fracture surgery     Family History  Problem Relation Age of Onset  . Heart attack Father   . Stroke Mother    Social History:  reports that she has never smoked. She has never used smokeless tobacco. She reports that she does not drink alcohol or use illicit drugs. No Known Allergies Prior to Admission medications   Medication Sig Start Date End Date Taking? Authorizing Provider  acetaminophen (TYLENOL) 500 MG tablet Take 1,000 mg by mouth every 6 (six) hours as needed (pain).  Yes Historical Provider, MD  albuterol (PROVENTIL HFA;VENTOLIN HFA) 108 (90 Base) MCG/ACT inhaler Inhale 2 puffs into the lungs every 4 (four) hours as needed for wheezing or shortness of breath.   Yes Historical Provider, MD  alendronate (FOSAMAX) 70 MG tablet Take 70 mg by mouth once a week. Take with a full glass of water on an empty stomach.   Yes Historical Provider, MD  amitriptyline (ELAVIL) 10 MG tablet Take 10 mg by mouth at bedtime.    Yes Historical Provider, MD  aspirin EC 81 MG tablet Take 81 mg by mouth daily.   Yes Historical  Provider, MD  atorvastatin (LIPITOR) 20 MG tablet Take 1 tablet (20 mg total) by mouth daily. 11/11/13  Yes Velvet Bathe, MD  b complex-vitamin [name]-folic acid (NEPHRO-VITE) 0.8 MG TABS tablet Take 1 tablet by mouth at bedtime.   Yes Historical Provider, MD  cinacalcet (SENSIPAR) 30 MG tablet Take 30 mg by mouth daily with supper.   Yes Historical Provider, MD  docusate sodium (COLACE) 100 MG capsule Take 100 mg by mouth daily.   Yes Historical Provider, MD  febuxostat (ULORIC) 40 MG tablet Take 40 mg by mouth at bedtime.   Yes Historical Provider, MD  feeding supplement, GLUCERNA SHAKE, (GLUCERNA SHAKE) LIQD Take 237 mLs by mouth 2 (two) times daily between meals. 11/11/13  Yes Velvet Bathe, MD  fexofenadine (ALLEGRA) 180 MG tablet Take 180 mg by mouth daily as needed for allergies or rhinitis.   Yes Historical Provider, MD  fish oil-omega-3 fatty acids 1000 MG capsule Take 1 g by mouth daily.    Yes Historical Provider, MD  Glucosamine-Chondroitin 250-200 MG CAPS Take 1 capsule by mouth daily.     Yes Historical Provider, MD  guaiFENesin (ROBITUSSIN) 100 MG/5ML liquid Take 200 mg by mouth every 6 (six) hours as needed for cough.   Yes Historical Provider, MD  insulin detemir (LEVEMIR) 100 UNIT/ML injection Inject 5 Units into the skin daily.    Yes Historical Provider, MD  insulin regular (NOVOLIN R,HUMULIN R) 100 units/mL injection Inject 0-12 Units into the skin 3 (three) times daily before meals. Per sliding scale: 0-150=0 units, 151-200 = 2 units, 201-250 = 4 units, 251-300 = 6 units, 301-350 = 8 units, 351-400 = 10 units, 401-450 = 12 units, > 450 CALL MD.   Yes Historical Provider, MD  lansoprazole (PREVACID) 30 MG capsule Take 30 mg by mouth daily at 12 noon.   Yes Historical Provider, MD  lanthanum (FOSRENOL) 500 MG chewable tablet Chew 1,000 mg by mouth 3 (three) times daily with meals.    Yes Historical Provider, MD  magnesium hydroxide (MILK OF MAGNESIA) 400 MG/5ML suspension Take 30 mLs by  mouth daily as needed for mild constipation.   Yes Historical Provider, MD  nystatin (MYCOSTATIN/NYSTOP) 100000 UNIT/GM POWD Apply 1 g topically every 8 (eight) hours as needed (yeastlike areas).    Yes Historical Provider, MD  ondansetron (ZOFRAN) 8 MG tablet Take 8 mg by mouth 3 (three) times daily as needed for nausea or vomiting.   Yes Historical Provider, MD  OxyCODONE HCl, Abuse Deter, 5 MG TABA Take 5 mg by mouth every 4 (four) hours as needed (pain).    Yes Historical Provider, MD  phenol (CHLORASEPTIC) 1.4 % LIQD Use as directed 1 spray in the mouth or throat 3 (three) times daily as needed for throat irritation / pain.   Yes Historical Provider, MD  polyethylene glycol (MIRALAX / GLYCOLAX) packet Take 17 g  by mouth at bedtime.   Yes Historical Provider, MD  polyvinyl alcohol (LIQUIFILM TEARS) 1.4 % ophthalmic solution Place 2 drops into both eyes 4 (four) times daily.   Yes Historical Provider, MD  traMADol (ULTRAM) 50 MG tablet Take 1 tablet (50 mg total) by mouth every 6 (six) hours as needed (pain). Patient taking differently: Take 50 mg by mouth 4 (four) times daily as needed (pain).  11/11/13  Yes Velvet Bathe, MD  vitamin B-12 (CYANOCOBALAMIN) 1000 MCG tablet Take 1,000 mcg by mouth daily.   Yes Historical Provider, MD   Current Facility-Administered Medications  Medication Dose Route Frequency Provider Last Rate Last Dose  . albuterol (PROVENTIL HFA;VENTOLIN HFA) 108 (90 Base) MCG/ACT inhaler 2 puff  2 puff Inhalation Q4H PRN Edwin Dada, MD      . amitriptyline (ELAVIL) tablet 10 mg  10 mg Oral QHS Edwin Dada, MD      . aspirin EC tablet 81 mg  81 mg Oral Daily Edwin Dada, MD   81 mg at 10/10/15 1031  . atorvastatin (LIPITOR) tablet 20 mg  20 mg Oral Daily Edwin Dada, MD   20 mg at 10/10/15 1031  . calcitRIOL (ROCALTROL) capsule 0.5 mcg  0.5 mcg Oral Q M,W,F-HD Alric Seton, PA-[name]      . cinacalcet (SENSIPAR) tablet 30 mg  30 mg Oral Q  supper Edwin Dada, MD      . docusate sodium (COLACE) capsule 100 mg  100 mg Oral Daily Edwin Dada, MD   100 mg at 10/10/15 1031  . febuxostat (ULORIC) tablet 40 mg  40 mg Oral QHS Edwin Dada, MD      . feeding supplement (GLUCERNA SHAKE) (GLUCERNA SHAKE) liquid 237 mL  237 mL Oral BID BM Edwin Dada, MD      . ferric gluconate (NULECIT) 62.5 mg in sodium chloride 0.9 % 100 mL IVPB  62.5 mg Intravenous Q Wed-HD Alric Seton, PA-[name]   Stopped at 10/10/15 0940  . guaiFENesin (ROBITUSSIN) 100 MG/5ML solution 200 mg  200 mg Oral Q6H PRN Edwin Dada, MD      . heparin injection 5,000 Units  5,000 Units Subcutaneous 3 times per day Edwin Dada, MD   5,000 Units at 10/10/15 0957  . insulin aspart (novoLOG) injection 0-10 Units  0-10 Units Subcutaneous TID WC Edwin Dada, MD      . insulin detemir (LEVEMIR) injection 3 Units  3 Units Subcutaneous Daily Edwin Dada, MD   3 Units at 10/10/15 [phone]  . lanthanum (FOSRENOL) chewable tablet 1,000 mg  1,000 mg Oral TID WC Edwin Dada, MD   1,000 mg at 10/10/15 1034  . magnesium hydroxide (MILK OF MAGNESIA) suspension 30 mL  30 mL Oral Daily PRN Edwin Dada, MD      . multivitamin (RENA-VIT) tablet 1 tablet  1 tablet Oral QHS Alric Seton, PA-[name]      . ondansetron [facility]) tablet 8 mg  8 mg Oral TID PRN Edwin Dada, MD      . pantoprazole (PROTONIX) EC tablet 20 mg  20 mg Oral Daily Edwin Dada, MD   20 mg at 10/10/15 1030  . sodium chloride 0.9 % injection 3 mL  3 mL Intravenous Q12H Edwin Dada, MD       Current Outpatient Prescriptions  Medication Sig Dispense Refill  . acetaminophen (TYLENOL) 500 MG tablet Take 1,000 mg by mouth every 6 (six)  hours as needed (pain).    Marland Kitchen albuterol (PROVENTIL HFA;VENTOLIN HFA) 108 (90 Base) MCG/ACT inhaler Inhale 2 puffs into the lungs every 4 (four) hours as needed for wheezing or shortness of  breath.    Marland Kitchen alendronate (FOSAMAX) 70 MG tablet Take 70 mg by mouth once a week. Take with a full glass of water on an empty stomach.    Marland Kitchen amitriptyline (ELAVIL) 10 MG tablet Take 10 mg by mouth at bedtime.     Marland Kitchen aspirin EC 81 MG tablet Take 81 mg by mouth daily.    Marland Kitchen atorvastatin (LIPITOR) 20 MG tablet Take 1 tablet (20 mg total) by mouth daily. 30 tablet 0  . b complex-vitamin [name]-folic acid (NEPHRO-VITE) 0.8 MG TABS tablet Take 1 tablet by mouth at bedtime.    . cinacalcet (SENSIPAR) 30 MG tablet Take 30 mg by mouth daily with supper.    . docusate sodium (COLACE) 100 MG capsule Take 100 mg by mouth daily.    . febuxostat (ULORIC) 40 MG tablet Take 40 mg by mouth at bedtime.    . feeding supplement, GLUCERNA SHAKE, (GLUCERNA SHAKE) LIQD Take 237 mLs by mouth 2 (two) times daily between meals. 30 Can 0  . fexofenadine (ALLEGRA) 180 MG tablet Take 180 mg by mouth daily as needed for allergies or rhinitis.    . fish oil-omega-3 fatty acids 1000 MG capsule Take 1 g by mouth daily.     . Glucosamine-Chondroitin 250-200 MG CAPS Take 1 capsule by mouth daily.      Marland Kitchen guaiFENesin (ROBITUSSIN) 100 MG/5ML liquid Take 200 mg by mouth every 6 (six) hours as needed for cough.    . insulin detemir (LEVEMIR) 100 UNIT/ML injection Inject 5 Units into the skin daily.     . insulin regular (NOVOLIN R,HUMULIN R) 100 units/mL injection Inject 0-12 Units into the skin 3 (three) times daily before meals. Per sliding scale: 0-150=0 units, 151-200 = 2 units, 201-250 = 4 units, 251-300 = 6 units, 301-350 = 8 units, 351-400 = 10 units, 401-450 = 12 units, > 450 CALL MD.    . lansoprazole (PREVACID) 30 MG capsule Take 30 mg by mouth daily at 12 noon.    Marland Kitchen lanthanum (FOSRENOL) 500 MG chewable tablet Chew 1,000 mg by mouth 3 (three) times daily with meals.     . magnesium hydroxide (MILK OF MAGNESIA) 400 MG/5ML suspension Take 30 mLs by mouth daily as needed for mild constipation.    Marland Kitchen nystatin (MYCOSTATIN/NYSTOP) 100000  UNIT/GM POWD Apply 1 g topically every 8 (eight) hours as needed (yeastlike areas).     . ondansetron (ZOFRAN) 8 MG tablet Take 8 mg by mouth 3 (three) times daily as needed for nausea or vomiting.    . OxyCODONE HCl, Abuse Deter, 5 MG TABA Take 5 mg by mouth every 4 (four) hours as needed (pain).     . phenol (CHLORASEPTIC) 1.4 % LIQD Use as directed 1 spray in the mouth or throat 3 (three) times daily as needed for throat irritation / pain.    . polyethylene glycol (MIRALAX / GLYCOLAX) packet Take 17 g by mouth at bedtime.    . polyvinyl alcohol (LIQUIFILM TEARS) 1.4 % ophthalmic solution Place 2 drops into both eyes 4 (four) times daily.    . traMADol (ULTRAM) 50 MG tablet Take 1 tablet (50 mg total) by mouth every 6 (six) hours as needed (pain). (Patient taking differently: Take 50 mg by mouth 4 (four) times daily as needed (  pain). ) 30 tablet 0  . vitamin B-12 (CYANOCOBALAMIN) 1000 MCG tablet Take 1,000 mcg by mouth daily.     Labs: Basic Metabolic Panel:  Recent Labs Lab 10/10/15 0145  NA 135  K 6.0*  CL 93*  CO2 29  GLUCOSE 101*  BUN 37*  CREATININE 4.21*  CALCIUM 9.1  CBC:  Recent Labs Lab 10/10/15 0145  WBC 7.1  NEUTROABS 4.9  HGB 10.9*  HCT 34.1*  MCV 95.3  PLT 220   Cardiac Enzymes:  Recent Labs Lab 10/10/15 0145 10/10/15 0811  TROPONINI 0.36* 0.42*   CBG:  Recent Labs Lab 10/10/15 0143 10/10/15 0218  GLUCAP 92 124*   Studies/Results: Dg Chest 2 View  10/10/2015  CLINICAL DATA:  Acute onset of shortness of breath and cough. Hypoglycemia. Initial encounter. EXAM: CHEST  2 VIEW COMPARISON:  Chest radiograph performed 12/02/2014 FINDINGS: The lungs are mildly hypoexpanded. Vascular congestion is noted, with increased interstitial markings, concerning for mild pulmonary edema. Pneumonia could have a similar appearance. Small bilateral pleural effusions are seen. No pneumothorax is seen. There is mild lateral pleural thickening bilaterally. The heart is  mildly enlarged. No acute osseous abnormalities are seen. IMPRESSION: Lungs mildly hypoexpanded. Vascular congestion and mild cardiomegaly, with increased interstitial markings, concerning for mild pulmonary edema. Pneumonia could have a similar appearance. Small bilateral pleural effusions noted. Electronically Signed   By: Garald Balding M.D.   On: 10/10/2015 02:52    ROS: As per HPI otherwise negative.  Physical Exam: Filed Vitals:   10/10/15 0830 10/10/15 0900 10/10/15 1025 10/10/15 1131  BP: 166/50 173/50 150/45 136/41  Pulse: 79 81 82 76  Temp:      TempSrc:      Resp: 19 22 26 21   SpO2: 93% 92% 84% 99%     General:  Elderly lady NAD Head: Normocephalic, atraumatic, sclera non-icteric, mucus membranes are moist Neck: Supple. JVD not elevated. Lungs: Clear bilaterally to auscultation without wheezes, rales, or rhonchi. Breathing is unlabored. Heart: RRR with S1 S2. 2/6 murmur Abdomen: Soft, non-tender, non-distended with normoactive bowel sounds.. M-S:  Strength and tone appear normal for age. Lower extremities:without edema or ischemic changes, no open wounds  Neuro: Alert and oriented X 3. Moves all extremities spontaneously. Psych:  Responds to questions appropriately with a normal affect. Dialysis Access:right upper AVF + bruit and thrill  Dialysis Orders:  [facility] MWF 3.5 hr 160 400/A 1.5 2 K 2.25 Ca right upper AVF heparin 2000 venofer 50/week mircera 50 q 4 weeks - last 12/21 calcitriol 0.5 Recent labs: Hgb 9 down from mid 10s 21% sat feritin 1249 October P 7s iPTH 793  Assessment/Plan: 1.  Hypoglycemia in pt with DM on insulin- intake diminished yesterday and had vomiting. 2.  ESRD - With hyperkalemia - K 6 - due for HD today - has been ordered; noted outpt AFs tending down - running 787 and 829 down from 1200 - 1700; continue to follow refer for evaluation after d/[name] - ^ K could also be from foods given to bring up blood sugar - repeat BMP pre HD; had kayexalate earlier  today 3.  Hypertension/volume  - BP variable CXR vascular congestion ? Pul edema goal 2 L UF as BP allows 4.  Anemia  - Hgb 10.9; up from last outpt Hgb of 9 has been trending down from 10s last tsat 21% on venofer 50 per week ferritin 1249 in October - continue weekly - ESA due for redose 1/18 - give ARanesp 60  5.  Metabolic bone disease -  Continue Calcitriol P runs about 7/sensipar/fosrenol 6.  Nutrition - renal diet/suppl/vitamin 7. Hx of esophageal stricture s/p dilation 2015 - monitor intake and pattern, if any of vomiting.  [name] Jacobson, PA-[name] [place] [phone] 10/10/2015, 12:03 PM   Renal Attending: I agree with note as articulated above. [name] [name]

## 2015-10-10 NOTE — ED Notes (Signed)
Pt reports relief from nausea, pt able to swallow medication with applesauce.

## 2015-10-10 NOTE — ED Provider Notes (Signed)
CSN: [id number]     Arrival date & time 10/10/15  0106 History  By signing my name below, I, Soijett Blue, attest that this documentation has been prepared under the direction and in the presence of Orpah Greek, MD. Electronically Signed: Soijett Blue, ED Scribe. 10/10/2015. 1:30 AM.   Chief Complaint  Patient presents with  . Hypoglycemia      The history is provided by the patient, the EMS personnel and a relative. No language interpreter was used.    [name] [name] is a 82 y.o. female with a medical hx of type 2 DM, HTN, CAD, ESRD, who presents to the Emergency Department complaining of hypoglycemia onset PTA. Pt relative notes that the pt has had blood sugar levels ranging from 44, 89, 54 from 4 PM-1 AM. Pt had EMS called out for her twice today but the pt relative didn't allow EMS to take the pt to be seen for her symptoms at the first call. Pt takes levemir and novolog without any oral medications for her DM. Pt is a dialysis pt and has been on dialysis x 4 months at this time. Pt is having associated symptoms of intermittent cough x 2 weeks ago. She notes that she has tried inhaler with no relief of her symptoms. She denies any other symptoms.    Past Medical History  Diagnosis Date  . Type 2 diabetes mellitus (HCC)     mellitus? x25 years  . Essential hypertension   . Hypercholesterolemia   . ESRD (end stage renal disease) ([place])   . Anemia of chronic disease   . CAD (coronary artery disease)     Nonobstructive   . GERD (gastroesophageal reflux disease)   . HOCM (hypertrophic obstructive cardiomyopathy) [facility])    Past Surgical History  Procedure Laterality Date  . Total knee arthroplasty      L-2006; R-2008   . Appendectomy    . Transthoracic echocardiogram  05/2011  . Av fistula placement  07/07/2012    Procedure: ARTERIOVENOUS (AV) FISTULA CREATION;  Surgeon: Mal Misty, MD;  Location: [facility] OR;  Service: Vascular;  Laterality: Right;  Brachial - cephalic  arterivenous fistula  . Esophagogastroduodenoscopy (egd) with esophageal dilation N/A 10/12/2013    Procedure: ESOPHAGOGASTRODUODENOSCOPY (EGD) WITH ESOPHAGEAL DILATION;  Surgeon: Lafayette Dragon, MD;  Location: [facility] ENDOSCOPY;  Service: Endoscopy;  Laterality: N/A;  . Savory dilation N/A 10/12/2013    Procedure: SAVORY DILATION;  Surgeon: Lafayette Dragon, MD;  Location: [facility] ENDOSCOPY;  Service: Endoscopy;  Laterality: N/A;  . Back surgery    . Fracture surgery     Family History  Problem Relation Age of Onset  . Heart attack Father   . Stroke Mother    Social History  Substance Use Topics  . Smoking status: Never Smoker   . Smokeless tobacco: Never Used     Comment: no smoking   . Alcohol Use: No   OB History    No data available     Review of Systems  Respiratory: Positive for cough.   Skin: Negative for color change and wound.  All other systems reviewed and are negative.     Allergies  Review of patient's allergies indicates no known allergies.  Home Medications   Prior to Admission medications   Medication Sig Start Date End Date Taking? Authorizing Provider  acetaminophen (TYLENOL) 500 MG tablet Take 1,000 mg by mouth every 6 (six) hours as needed (pain).   Yes Historical Provider, MD  albuterol (  PROVENTIL HFA;VENTOLIN HFA) 108 (90 Base) MCG/ACT inhaler Inhale 2 puffs into the lungs every 4 (four) hours as needed for wheezing or shortness of breath.   Yes Historical Provider, MD  alendronate (FOSAMAX) 70 MG tablet Take 70 mg by mouth once a week. Take with a full glass of water on an empty stomach.   Yes Historical Provider, MD  amitriptyline (ELAVIL) 10 MG tablet Take 10 mg by mouth at bedtime.    Yes Historical Provider, MD  aspirin EC 81 MG tablet Take 81 mg by mouth daily.   Yes Historical Provider, MD  atorvastatin (LIPITOR) 20 MG tablet Take 1 tablet (20 mg total) by mouth daily. 11/11/13  Yes Velvet Bathe, MD  b complex-vitamin c-folic acid (NEPHRO-VITE) 0.8 MG TABS  tablet Take 1 tablet by mouth at bedtime.   Yes Historical Provider, MD  cinacalcet (SENSIPAR) 30 MG tablet Take 30 mg by mouth daily with supper.   Yes Historical Provider, MD  docusate sodium (COLACE) 100 MG capsule Take 100 mg by mouth daily.   Yes Historical Provider, MD  febuxostat (ULORIC) 40 MG tablet Take 40 mg by mouth at bedtime.   Yes Historical Provider, MD  feeding supplement, GLUCERNA SHAKE, (GLUCERNA SHAKE) LIQD Take 237 mLs by mouth 2 (two) times daily between meals. 11/11/13  Yes Velvet Bathe, MD  fexofenadine (ALLEGRA) 180 MG tablet Take 180 mg by mouth daily as needed for allergies or rhinitis.   Yes Historical Provider, MD  fish oil-omega-3 fatty acids 1000 MG capsule Take 1 g by mouth daily.    Yes Historical Provider, MD  Glucosamine-Chondroitin 250-200 MG CAPS Take 1 capsule by mouth daily.     Yes Historical Provider, MD  guaiFENesin (ROBITUSSIN) 100 MG/5ML liquid Take 200 mg by mouth every 6 (six) hours as needed for cough.   Yes Historical Provider, MD  insulin detemir (LEVEMIR) 100 UNIT/ML injection Inject 5 Units into the skin daily.    Yes Historical Provider, MD  insulin regular (NOVOLIN R,HUMULIN R) 100 units/mL injection Inject 0-12 Units into the skin 3 (three) times daily before meals. Per sliding scale: 0-150=0 units, 151-200 = 2 units, 201-250 = 4 units, 251-300 = 6 units, 301-350 = 8 units, 351-400 = 10 units, 401-450 = 12 units, > 450 CALL MD.   Yes Historical Provider, MD  lansoprazole (PREVACID) 30 MG capsule Take 30 mg by mouth daily at 12 noon.   Yes Historical Provider, MD  lanthanum (FOSRENOL) 500 MG chewable tablet Chew 1,000 mg by mouth 3 (three) times daily with meals.    Yes Historical Provider, MD  magnesium hydroxide (MILK OF MAGNESIA) 400 MG/5ML suspension Take 30 mLs by mouth daily as needed for mild constipation.   Yes Historical Provider, MD  nystatin (MYCOSTATIN/NYSTOP) 100000 UNIT/GM POWD Apply 1 g topically every 8 (eight) hours as needed  (yeastlike areas).    Yes Historical Provider, MD  ondansetron (ZOFRAN) 8 MG tablet Take 8 mg by mouth 3 (three) times daily as needed for nausea or vomiting.   Yes Historical Provider, MD  OxyCODONE HCl, Abuse Deter, 5 MG TABA Take 5 mg by mouth every 4 (four) hours as needed (pain).    Yes Historical Provider, MD  phenol (CHLORASEPTIC) 1.4 % LIQD Use as directed 1 spray in the mouth or throat 3 (three) times daily as needed for throat irritation / pain.   Yes Historical Provider, MD  polyethylene glycol (MIRALAX / GLYCOLAX) packet Take 17 g by mouth at bedtime.  Yes Historical Provider, MD  polyvinyl alcohol (LIQUIFILM TEARS) 1.4 % ophthalmic solution Place 2 drops into both eyes 4 (four) times daily.   Yes Historical Provider, MD  traMADol (ULTRAM) 50 MG tablet Take 1 tablet (50 mg total) by mouth every 6 (six) hours as needed (pain). Patient taking differently: Take 50 mg by mouth 4 (four) times daily as needed (pain).  11/11/13  Yes Velvet Bathe, MD  vitamin B-12 (CYANOCOBALAMIN) 1000 MCG tablet Take 1,000 mcg by mouth daily.   Yes Historical Provider, MD   BP 177/54 mmHg  Pulse 83  Temp(Src) 98.3 F (36.8 C) (Oral)  Resp 25  SpO2 94%  LMP  (LMP Unknown) Physical Exam  Constitutional: She is oriented to person, place, and time. She appears well-developed and well-nourished. No distress.  HENT:  Head: Normocephalic and atraumatic.  Right Ear: Hearing normal.  Left Ear: Hearing normal.  Nose: Nose normal.  Mouth/Throat: Oropharynx is clear and moist and mucous membranes are normal.  Eyes: Conjunctivae and EOM are normal. Pupils are equal, round, and reactive to light.  Neck: Normal range of motion. Neck supple.  Cardiovascular: Normal rate, regular rhythm, S1 normal, S2 normal and normal heart sounds.  Exam reveals no gallop and no friction rub.   No murmur heard. Pulmonary/Chest: Effort normal. No respiratory distress. She has rales. She exhibits no tenderness.  Coarse breathe  sounds with scattered rales at the bases.   Abdominal: Soft. Normal appearance and bowel sounds are normal. There is no hepatosplenomegaly. There is no tenderness. There is no rebound, no guarding, no tenderness at McBurney's point and negative Murphy's sign. No hernia.  Musculoskeletal: Normal range of motion.  Neurological: She is alert and oriented to person, place, and time. She has normal strength. No cranial nerve deficit or sensory deficit. Coordination normal. GCS eye subscore is 4. GCS verbal subscore is 5. GCS motor subscore is 6.  Skin: Skin is warm, dry and intact. No rash noted. No cyanosis.  Psychiatric: She has a normal mood and affect. Her speech is normal and behavior is normal. Thought content normal.  Nursing note and vitals reviewed.   ED Course  Procedures (including critical care time) DIAGNOSTIC STUDIES: Oxygen Saturation is 98% on RA, nl by my interpretation.    COORDINATION OF CARE: 1:28 AM Discussed treatment plan with pt at bedside and pt agreed to plan.    Labs Review Labs Reviewed  CBC WITH DIFFERENTIAL/PLATELET - Abnormal; Notable for the following:    RBC 3.58 (*)    Hemoglobin 10.9 (*)    HCT 34.1 (*)    All other components within normal limits  BASIC METABOLIC PANEL - Abnormal; Notable for the following:    Potassium 6.0 (*)    Chloride 93 (*)    Glucose, Bld 101 (*)    BUN 37 (*)    Creatinine, Ser 4.21 (*)    GFR calc non Af Amer 9 (*)    GFR calc Af Amer 10 (*)    All other components within normal limits  TROPONIN I - Abnormal; Notable for the following:    Troponin I 0.36 (*)    All other components within normal limits  CBG MONITORING, ED - Abnormal; Notable for the following:    Glucose-Capillary 124 (*)    All other components within normal limits  URINALYSIS, ROUTINE W REFLEX MICROSCOPIC (NOT AT [facility])  CBG MONITORING, ED    Imaging Review Dg Chest 2 View  10/10/2015  CLINICAL DATA:  Acute  onset of shortness of breath and cough.  Hypoglycemia. Initial encounter. EXAM: CHEST  2 VIEW COMPARISON:  Chest radiograph performed 12/02/2014 FINDINGS: The lungs are mildly hypoexpanded. Vascular congestion is noted, with increased interstitial markings, concerning for mild pulmonary edema. Pneumonia could have a similar appearance. Small bilateral pleural effusions are seen. No pneumothorax is seen. There is mild lateral pleural thickening bilaterally. The heart is mildly enlarged. No acute osseous abnormalities are seen. IMPRESSION: Lungs mildly hypoexpanded. Vascular congestion and mild cardiomegaly, with increased interstitial markings, concerning for mild pulmonary edema. Pneumonia could have a similar appearance. Small bilateral pleural effusions noted. Electronically Signed   By: Garald Balding M.D.   On: 10/10/2015 02:52   I have personally reviewed and evaluated these images and lab results as part of my medical decision-making.   EKG Interpretation None      ED ECG REPORT   Date: 10/10/2015  Rate: 71  Rhythm: normal sinus rhythm  QRS Axis: normal  Intervals: PR prolonged  ST/T Wave abnormalities: nonspecific T wave changes, ST elevations anteriorly, ST depressions inferiorly and ST depressions laterally  Conduction Disutrbances:first-degree A-V block   Narrative Interpretation:   Old EKG Reviewed: unchanged  I have personally reviewed the EKG tracing and agree with the computerized printout as noted.   MDM   Final diagnoses:  Hypoglycemia  ESRD (end stage renal disease) ([place])  Other hypervolemia  HCAP (healthcare-associated pneumonia)    Patient brought to the emergency department for evaluation of low blood sugar. Patient has had persistently low blood sugars through the course of the day. She has been given IV dextrose by EMS and oral glucose as well. The first couple of times EMS responded, she was not transported because she improved with treatment. Blood sugar ultimately dropped again, however. At arrival  to the ER, blood sugar was in normal limits and has maintained without further episodes of hypoglycemia.  Patient was, however, complaining of shortness of breath during transport. EMS report significant wheezing, was treated with albuterol with some improvement. Patient is a dialysis patient. Chest x-ray consistent with vascular congestion and pulmonary edema. This is likely secondary to her end-stage renal disease and will require additional dialysis. Cannot, however, rule out pneumonia. He has had cough and chest congestion for the last 2 weeks. Will need to cover for possibility of pneumonia as well. She was in an assisted living center but is on dialysis, will need coverage for HCAP.  Patient has mildly elevated potassium as well. No significant EKG changes. Administer Kayexalate.  Multiple attempts to contact nephrology were unsuccessful. I do not believe the patient requires emergent dialysis. Nephrology will need to be consulted in the morning for dialysis session initiation.  I personally performed the services described in this documentation, which was scribed in my presence. The recorded information has been reviewed and is accurate.    Orpah Greek, MD 10/10/15 [phone]

## 2015-10-10 NOTE — Progress Notes (Signed)
[facility] PROGRESS NOTE   [name] [name] [id number] DOB: 07/11/1926 DOA: 10/10/2015 PCP: Ann Held, MD  HPI/Subjective: Seen while she was having her dialysis, denies any active complaints.  Assessment/Plan: Principal Problem:   Hypoglycemia Active Problems:   Type 2 diabetes, uncontrolled, with renal manifestation (HCC)   Deficiency anemia   Essential hypertension   End-stage renal disease (ESRD) (HCC)   Hyperkalemia   Fluid overload   Elevated troponin   Hypervolemia   This is a no charge note, patient seen and examined, data base reviewed. Patient seen earlier today by my colleague Dr. Loleta Books. Admitted for hypoglycemic episode, she has ESRD on dialysis and she is DO NOT RESUSCITATE. Mild elevation of troponin, 0.36 and 0.42, follow trend, currently does not have any chest pain.  Code Status: DNR Family Communication: Plan discussed with the patient. Disposition Plan: Remains inpatient Diet: Diet renal with fluid restriction Fluid restriction:: 1200 mL Fluid; Room service appropriate?: Yes; Fluid consistency:: Thin  Consultants:  Nephrology  Procedures:  None  Antibiotics:  None (indicate start date, and stop date if known)   Objective: Filed Vitals:   10/10/15 1328 10/10/15 1333  BP: 115/68 120/40  Pulse: 82 64  Temp: 98.7 F (37.1 C)   Resp: 23 23   No intake or output data in the 24 hours ending 10/10/15 1416 Filed Weights   10/10/15 1258 10/10/15 1328  Weight: 71.124 kg (156 lb 12.8 oz) 70.7 kg (155 lb 13.8 oz)    Exam: General: Alert and awake, oriented x3, not in any acute distress. HEENT: anicteric sclera, pupils reactive to light and accommodation, EOMI CVS: S1-S2 clear, no murmur rubs or gallops Chest: clear to auscultation bilaterally, no wheezing, rales or rhonchi Abdomen: soft nontender, nondistended, normal bowel sounds, no organomegaly Extremities: no cyanosis, clubbing or edema noted bilaterally Neuro: Cranial  nerves II-XII intact, no focal neurological deficits  Data Reviewed: Basic Metabolic Panel:  Recent Labs Lab 10/10/15 0145  NA 135  K 6.0*  CL 93*  CO2 29  GLUCOSE 101*  BUN 37*  CREATININE 4.21*  CALCIUM 9.1   Liver Function Tests: No results for input(s): AST, ALT, ALKPHOS, BILITOT, PROT, ALBUMIN in the last 168 hours. No results for input(s): LIPASE, AMYLASE in the last 168 hours. No results for input(s): AMMONIA in the last 168 hours. CBC:  Recent Labs Lab 10/10/15 0145  WBC 7.1  NEUTROABS 4.9  HGB 10.9*  HCT 34.1*  MCV 95.3  PLT 220   Cardiac Enzymes:  Recent Labs Lab 10/10/15 0145 10/10/15 0811  TROPONINI 0.36* 0.42*   BNP (last 3 results) No results for input(s): BNP in the last 8760 hours.  ProBNP (last 3 results) No results for input(s): PROBNP in the last 8760 hours.  CBG:  Recent Labs Lab 10/10/15 0143 10/10/15 0218 10/10/15 1249  GLUCAP 92 124* 191*    Micro No results found for this or any previous visit (from the past 240 hour(s)).   Studies: Dg Chest 2 View  10/10/2015  CLINICAL DATA:  Acute onset of shortness of breath and cough. Hypoglycemia. Initial encounter. EXAM: CHEST  2 VIEW COMPARISON:  Chest radiograph performed 12/02/2014 FINDINGS: The lungs are mildly hypoexpanded. Vascular congestion is noted, with increased interstitial markings, concerning for mild pulmonary edema. Pneumonia could have a similar appearance. Small bilateral pleural effusions are seen. No pneumothorax is seen. There is mild lateral pleural thickening bilaterally. The heart is mildly enlarged. No acute osseous abnormalities are seen. IMPRESSION: Lungs mildly hypoexpanded. Vascular  congestion and mild cardiomegaly, with increased interstitial markings, concerning for mild pulmonary edema. Pneumonia could have a similar appearance. Small bilateral pleural effusions noted. Electronically Signed   By: Garald Balding M.D.   On: 10/10/2015 02:52    Scheduled  Meds: . amitriptyline  10 mg Oral QHS  . aspirin EC  81 mg Oral Daily  . atorvastatin  20 mg Oral Daily  . calcitRIOL  0.5 mcg Oral Q M,W,F-HD  . cinacalcet  30 mg Oral Q supper  . [START ON 10/11/2015] darbepoetin (ARANESP) injection - DIALYSIS  60 mcg Intravenous Q Thu-HD  . docusate sodium  100 mg Oral Daily  . febuxostat  40 mg Oral QHS  . feeding supplement (GLUCERNA SHAKE)  237 mL Oral BID BM  . ferric gluconate (FERRLECIT/NULECIT) IV  62.5 mg Intravenous Q Wed-HD  . heparin  5,000 Units Subcutaneous 3 times per day  . insulin aspart  0-10 Units Subcutaneous TID WC  . insulin detemir  3 Units Subcutaneous Daily  . lanthanum  1,000 mg Oral TID WC  . multivitamin  1 tablet Oral QHS  . pantoprazole  20 mg Oral Daily  . sodium chloride  3 mL Intravenous Q12H   Continuous Infusions:      Time spent: 35 minutes    [facility]  [facility] Pager [phone] If 7PM-7AM, please contact night-coverage at [email or web address], password [facility] 10/10/2015, 2:16 PM

## 2015-10-10 NOTE — ED Notes (Signed)
Pt arrived via [facility] c/o hypoglycemia.  EMS got CBG 89.

## 2015-10-10 NOTE — ED Notes (Signed)
CBG 124 

## 2015-10-10 NOTE — ED Notes (Signed)
Patient CBG was 133, Nurse was informed.

## 2015-10-10 NOTE — ED Notes (Signed)
Pt vomiting small amount after eating breakfast.

## 2015-10-10 NOTE — Progress Notes (Addendum)
Inpatient Diabetes Program Recommendations  AACE/ADA: New Consensus Statement on Inpatient Glycemic Control (2015)  Target Ranges:  Prepandial:   less than 140 mg/dL      Peak postprandial:   less than 180 mg/dL (1-2 hours)      Critically ill patients:  140 - 180 mg/dL   Noted Consult for Labile BG and dietary counseling. Agree with MD recommendations in reducing correction to a sensitive type scale (due to patient's renal status) and start correction when BG is 151 as it is written in the home med rec. According to the Nursing Facilities med record recorded in EPIC, patient should not have received 1 unit of insulin for a BG in the 80's. Patient is from a [place] and would not be appropriate for DM education as the patient really does not have control over meals and activity unless family is bringing food in. I am unsure of why the Nursing facility covered the glucose the way they did.   Thanks,  Tama Headings RN, MSN, [facility] Inpatient Diabetes Coordinator Team Pager [phone] (8a-5p)

## 2015-10-10 NOTE — ED Notes (Signed)
Patient CBG was 232. 

## 2015-10-10 NOTE — ED Notes (Signed)
CBG- 92 

## 2015-10-11 ENCOUNTER — Encounter (HOSPITAL_COMMUNITY): Payer: Self-pay | Admitting: General Practice

## 2015-10-11 DIAGNOSIS — E11649 Type 2 diabetes mellitus with hypoglycemia without coma: Secondary | ICD-10-CM | POA: Diagnosis not present

## 2015-10-11 DIAGNOSIS — E162 Hypoglycemia, unspecified: Secondary | ICD-10-CM | POA: Diagnosis not present

## 2015-10-11 LAB — BASIC METABOLIC PANEL
Anion gap: 9 (ref 5–15)
BUN: 14 mg/dL (ref 6–20)
CO2: 27 mmol/L (ref 22–32)
Calcium: 8.7 mg/dL — ABNORMAL LOW (ref 8.9–10.3)
Chloride: 100 mmol/L — ABNORMAL LOW (ref 101–111)
Creatinine, Ser: 2.8 mg/dL — ABNORMAL HIGH (ref 0.44–1.00)
GFR, EST AFRICAN AMERICAN: 16 mL/min — AB (ref >60)
GFR, EST NON AFRICAN AMERICAN: 14 mL/min — AB (ref >60)
Glucose, Bld: 88 mg/dL (ref 65–99)
POTASSIUM: 4.6 mmol/L (ref 3.5–5.1)
SODIUM: 136 mmol/L (ref 135–145)

## 2015-10-11 LAB — CBC
HEMATOCRIT: 30.4 % — AB (ref 36.0–46.0)
Hemoglobin: 9.5 g/dL — ABNORMAL LOW (ref 12.0–15.0)
MCH: 30.1 pg (ref 26.0–34.0)
MCHC: 31.3 g/dL (ref 30.0–36.0)
MCV: 96.2 fL (ref 78.0–100.0)
PLATELETS: 190 10*3/uL (ref 150–400)
RBC: 3.16 MIL/uL — AB (ref 3.87–5.11)
RDW: 14.6 % (ref 11.5–15.5)
WBC: 5.9 10*3/uL (ref 4.0–10.5)

## 2015-10-11 LAB — GLUCOSE, CAPILLARY
Glucose-Capillary: 94 mg/dL (ref 65–99)
Glucose-Capillary: 128 mg/dL — ABNORMAL HIGH (ref 65–99)
Glucose-Capillary: 137 mg/dL — ABNORMAL HIGH (ref 65–99)

## 2015-10-11 MED ORDER — INSULIN DETEMIR 100 UNIT/ML ~~LOC~~ SOLN: 3.0000 [IU] | Freq: Every day | SUBCUTANEOUS | Status: AC

## 2015-10-11 MED ORDER — INSULIN REGULAR HUMAN 100 UNIT/ML IJ SOLN: 0.0000 [IU] | Freq: Three times a day (TID) | INTRAMUSCULAR | Status: AC

## 2015-10-11 NOTE — Progress Notes (Signed)
[facility] Progress Note  Assessment/Plan: 1. Hypoglycemia in pt with DM on insulin t ALF- intake diminished yesterday and had vomiting; better yesterday - 94 this am 2. ESRD - With hyperkalemia- resolved - K 4.6 - down from 6 L 3. Hypertension/volume - BP variable CXR vascular congestion  Net UF Wednesday  2 L with post wt 68.8 - bed scale- titrate volume down slightly more 4. Anemia - Hgb 10.9; up from last outpt Hgb of 9 has been trending down from 10s last tsat 21% on venofer 50 per week ferritin 1249 in October - continue weekly - ESA due for redose 1/18 - give ARanesp 60 5. Metabolic bone disease - Continue Calcitriol P runs about 7/sensipar/fosrenol - 6. Nutrition - renal diet/suppl/vitamin 7. Hx of esophageal stricture s/p dilation 2015 - monitor intake and pattern, if any of vomiting.  Myriam Jacobson, PA-C [place] [phone] 10/11/2015,9:43 AM   Renal Attending: I agree with note as articulated above with no changes. Shavell Nored C     Subjective:  No problems with HD. Thinks she vomited because she ate too fast.  No vomiting with breakfast today. Denies SOB. Doesn't use O2 at ALF  Objective Filed Vitals:   10/10/15 1701 10/10/15 1722 10/10/15 2152 10/11/15 0546  BP: 125/50 127/52 120/59 118/52  Pulse: 83 84 81 79  Temp:  97.8 F (36.6 C) 97.5 F (36.4 C) 97.9 F (36.6 C)  TempSrc:  Oral Oral Oral  Resp:  24    Height:      Weight:  68.8 kg (151 lb 10.8 oz)  68.221 kg (150 lb 6.4 oz)  SpO2:  100% 97% 94%   Physical Exam General: elderly lady, frail but  NAD supine in bed w/ O2 Heart: RRR Lungs: poor expansion, few crackles at bases Abdomen: soft NT Extremities: no LE edema Dialysis Access: right upper AVF + bruit  Dialysis Orders: [facility] MWF 3.5 hr 160 EDW 68.5 400/A 1.5 2 K 2.25 Ca right upper AVF heparin 2000 venofer 50/week mircera 50 q 4 weeks - last 12/21 calcitriol 0.5 Recent labs: Hgb 9 down from mid 10s  21% sat feritin 1249 October P 7s iPTH 793  Additional Objective Labs: Basic Metabolic Panel:  Recent Labs Lab 10/10/15 0145 10/10/15 1356 10/11/15 0305  NA 135 134* 136  K 6.0* 6.0* 4.6  CL 93* 95* 100*  CO2 29 29 27   GLUCOSE 101* 211* 88  BUN 37* 40* 14  CREATININE 4.21* 5.08* 2.80*  CALCIUM 9.1 8.8* 8.7*   CBC:  Recent Labs Lab 10/10/15 0145 10/11/15 0305  WBC 7.1 5.9  NEUTROABS 4.9  --   HGB 10.9* 9.5*  HCT 34.1* 30.4*  MCV 95.3 96.2  PLT 220 190  Cardiac Enzymes:  Recent Labs Lab 10/10/15 0145 10/10/15 0811  TROPONINI 0.36* 0.42*   CBG:  Recent Labs Lab 10/10/15 0419 10/10/15 1249 10/10/15 1849 10/10/15 2143 10/11/15 0544  GLUCAP 120* 191* 118* 133* 94    Studies/Results: Dg Chest 2 View  10/10/2015  CLINICAL DATA:  Acute onset of shortness of breath and cough. Hypoglycemia. Initial encounter. EXAM: CHEST  2 VIEW COMPARISON:  Chest radiograph performed 12/02/2014 FINDINGS: The lungs are mildly hypoexpanded. Vascular congestion is noted, with increased interstitial markings, concerning for mild pulmonary edema. Pneumonia could have a similar appearance. Small bilateral pleural effusions are seen. No pneumothorax is seen. There is mild lateral pleural thickening bilaterally. The heart is mildly enlarged. No acute osseous abnormalities are seen. IMPRESSION:  Lungs mildly hypoexpanded. Vascular congestion and mild cardiomegaly, with increased interstitial markings, concerning for mild pulmonary edema. Pneumonia could have a similar appearance. Small bilateral pleural effusions noted. Electronically Signed   By: Garald Balding M.D.   On: 10/10/2015 02:52   Medications:   . amitriptyline  10 mg Oral QHS  . aspirin EC  81 mg Oral Daily  . atorvastatin  20 mg Oral Daily  . calcitRIOL  0.5 mcg Oral Q M,W,F-HD  . cinacalcet  30 mg Oral Q supper  . darbepoetin (ARANESP) injection - DIALYSIS  60 mcg Intravenous Q Thu-HD  . docusate sodium  100 mg Oral Daily  .  febuxostat  40 mg Oral QHS  . feeding supplement (GLUCERNA SHAKE)  237 mL Oral BID BM  . ferric gluconate (FERRLECIT/NULECIT) IV  62.5 mg Intravenous Q Wed-HD  . heparin  5,000 Units Subcutaneous 3 times per day  . insulin aspart  0-10 Units Subcutaneous TID WC  . insulin detemir  3 Units Subcutaneous Daily  . lanthanum  1,000 mg Oral TID WC  . multivitamin  1 tablet Oral QHS  . pantoprazole  20 mg Oral Daily  . sodium chloride  3 mL Intravenous Q12H

## 2015-10-11 NOTE — Care Management Note (Signed)
Case Management Note  Patient Details  Name: [name] [name] MRN: [id number] Date of Birth: 29-Feb-1926  Subjective/Objective:        Hypoglycemia            Action/Plan: NCM spoke to pt and states she lives at [facility]. Gave permission to speak to Jaquita Rector # [phone]. States dtr will pick her up to take to ALF. CSW referral for scheduled dc to ALF today.   Expected Discharge Date:                  Expected Discharge Plan:  Assisted Living / Rest Home  In-House Referral:  Clinical Social Work  Discharge planning Services  CM Consult  Post Acute Care Choice:  NA Choice offered to:  NA  DME Arranged:  N/A DME Agency:  NA  HH Arranged:  NA HH Agency:  NA  Status of Service:  Completed, signed off  Medicare Important Message Given:    Date Medicare IM Given:    Medicare IM give by:    Date Additional Medicare IM Given:    Additional Medicare Important Message give by:     If discussed at [place] of Stay Meetings, dates discussed:    Additional Comments:  Erenest Rasher, RN 10/11/2015, 1:02 PM

## 2015-10-11 NOTE — Care Management Obs Status (Signed)
[place] NOTIFICATION   Patient Details  Name: [name] [name] MRN: [id number] Date of Birth: [birth year]   Medicare Observation Status Notification Given:  Yes    Erenest Rasher, RN 10/11/2015, 1:01 PM

## 2015-10-11 NOTE — Progress Notes (Signed)
Pt to be disharge to ALF ([facility]). IV and telemetry removed. AVS printed and given to daughter. Daughter to take patient to ALF tonight. Report given to RN at [facility]. All questions addressed.

## 2015-10-11 NOTE — Discharge Summary (Signed)
Physician Discharge Summary  [name] [name] [id number] DOB: 02/17/1926 DOA: 10/10/2015  PCP: Ann Held, MD  Admit date: 10/10/2015 Discharge date: 10/11/2015  Recommendations for Outpatient Follow-up:  1.  Monitor blood glucose before qac and hs   Discharge Diagnoses:  Principal Problem:   Hypoglycemia Active Problems:   Type 2 diabetes, uncontrolled, with renal manifestation (HCC)   Deficiency anemia   Essential hypertension   End-stage renal disease (ESRD) (HCC)   Hyperkalemia   Fluid overload   Elevated troponin   Hypervolemia   Discharge Condition: Stable  Diet recommendation: Renal carbohydrate modified  Filed Weights   10/10/15 1328 10/10/15 1722 10/11/15 0546  Weight: 70.7 kg (155 lb 13.8 oz) 68.8 kg (151 lb 10.8 oz) 68.221 kg (150 lb 6.4 oz)    History of present illness:  82 y.o. female with a past medical history significant for ESRD on HD MWF, HTN, IDDM, and malnutrition who presents with hypogylcemia  The patient has been getting over a respiratory infection for the last two weeks (this started 2-3 weeks ago and was initially treated with albuterol, Zpak, and Robitussin, but has not improved significantly in the last two weeks). Also, during this time, she has had labile BG. Review of BG log from her nursing home shows numerous sugars as low as 65 mg/dL but also as high as 400 mg/dL. She uses levemir low dose nightly as well as sliding scale corrections at lunch.   Today at lunch, her BG was 84, and so she got her scheduled 1U insulin. At supper, she again got correction insulin, but did not eat much. At 10p, she was sweaty and so nursing checked her BG and it was in the 40s, and so EMS were called, but the patient's daughter asked them not to transport her immediately. They administered glucose and a small meal and her BG rose to 100. At midnight, it was checked again and her BG was 65 mg/dL, and so the patient's daughter asked for her to be sent to  the ER>  In the ED, the patient had hyperkalemia without ECG changes. Her BG was stable at 100 mg/dL. She had a CXR with what was read as venous congestion versus pneumonia. She had no fever, tachycardia, hypoxia, or leukocytosis  Hospital Course:  Observed overnight. Levemir decreased to 3 units. Sliding scale NovoLog adjusted downward. Had no further hypoglycemia. Chest x-ray findings were not felt to be pneumonia, rather an element of fluid overload. She was dialyzed. Potassium was 6.0 on admission and corrected with dialysis. She feels well, is tolerating a diet and clinically stable for transfer back to assisted-living. I have recommended continuing the adjusted insulin regimen, see below.  Procedures:  hemodialysis  Consultations:  nephrology  Discharge Exam: Filed Vitals:   10/11/15 0546 10/11/15 1136  BP: 118/52 127/39  Pulse: 79 66  Temp: 97.9 F (36.6 C) 97.9 F (36.6 C)  Resp:  20    General: comfortable. A and o Cardiovascular: RRR Respiratory: CTA without MGR Ext no CCE  Discharge Instructions   Discharge Instructions    Walk with assistance    Complete by:  As directed           Current Discharge Medication List    CONTINUE these medications which have CHANGED   Details  insulin detemir (LEVEMIR) 100 UNIT/ML injection Inject 0.03 mLs (3 Units total) into the skin daily. Qty: 10 mL, Refills: 11    insulin regular (NOVOLIN R,HUMULIN R) 100 units/mL injection Inject 0-0.12  mLs (0-12 Units total) into the skin 3 (three) times daily before meals. Per sliding scale: 0-150=0 units, 151-200 = 1 units, 201-250 = 2 units, 251-300 = 3 units, 301-350 = 4 units, 351-400 = 5 units, 401-450 = 6 units, > 450 CALL MD. Otho Darner: 10 mL, Refills: 11      CONTINUE these medications which have NOT CHANGED   Details  acetaminophen (TYLENOL) 500 MG tablet Take 1,000 mg by mouth every 6 (six) hours as needed (pain).    albuterol (PROVENTIL HFA;VENTOLIN HFA) 108 (90 Base)  MCG/ACT inhaler Inhale 2 puffs into the lungs every 4 (four) hours as needed for wheezing or shortness of breath.    alendronate (FOSAMAX) 70 MG tablet Take 70 mg by mouth once a week. Take with a full glass of water on an empty stomach.    amitriptyline (ELAVIL) 10 MG tablet Take 10 mg by mouth at bedtime.     aspirin EC 81 MG tablet Take 81 mg by mouth daily.    atorvastatin (LIPITOR) 20 MG tablet Take 1 tablet (20 mg total) by mouth daily. Qty: 30 tablet, Refills: 0    b complex-vitamin c-folic acid (NEPHRO-VITE) 0.8 MG TABS tablet Take 1 tablet by mouth at bedtime.    cinacalcet (SENSIPAR) 30 MG tablet Take 30 mg by mouth daily with supper.    docusate sodium (COLACE) 100 MG capsule Take 100 mg by mouth daily.    febuxostat (ULORIC) 40 MG tablet Take 40 mg by mouth at bedtime.    feeding supplement, GLUCERNA SHAKE, (GLUCERNA SHAKE) LIQD Take 237 mLs by mouth 2 (two) times daily between meals. Qty: 30 Can, Refills: 0    fexofenadine (ALLEGRA) 180 MG tablet Take 180 mg by mouth daily as needed for allergies or rhinitis.    fish oil-omega-3 fatty acids 1000 MG capsule Take 1 g by mouth daily.     Glucosamine-Chondroitin 250-200 MG CAPS Take 1 capsule by mouth daily.      guaiFENesin (ROBITUSSIN) 100 MG/5ML liquid Take 200 mg by mouth every 6 (six) hours as needed for cough.    lansoprazole (PREVACID) 30 MG capsule Take 30 mg by mouth daily at 12 noon.    lanthanum (FOSRENOL) 500 MG chewable tablet Chew 1,000 mg by mouth 3 (three) times daily with meals.     magnesium hydroxide (MILK OF MAGNESIA) 400 MG/5ML suspension Take 30 mLs by mouth daily as needed for mild constipation.    nystatin (MYCOSTATIN/NYSTOP) 100000 UNIT/GM POWD Apply 1 g topically every 8 (eight) hours as needed (yeastlike areas).     ondansetron (ZOFRAN) 8 MG tablet Take 8 mg by mouth 3 (three) times daily as needed for nausea or vomiting.    OxyCODONE HCl, Abuse Deter, 5 MG TABA Take 5 mg by mouth every 4  (four) hours as needed (pain).     phenol (CHLORASEPTIC) 1.4 % LIQD Use as directed 1 spray in the mouth or throat 3 (three) times daily as needed for throat irritation / pain.    polyethylene glycol (MIRALAX / GLYCOLAX) packet Take 17 g by mouth at bedtime.    polyvinyl alcohol (LIQUIFILM TEARS) 1.4 % ophthalmic solution Place 2 drops into both eyes 4 (four) times daily.    traMADol (ULTRAM) 50 MG tablet Take 1 tablet (50 mg total) by mouth every 6 (six) hours as needed (pain). Qty: 30 tablet, Refills: 0    vitamin B-12 (CYANOCOBALAMIN) 1000 MCG tablet Take 1,000 mcg by mouth daily.  No Known Allergies Follow-up Information    Follow up with Ann Held, MD.   Specialty:  Family Medicine   Why:  as previously scheduled   Contact information:   [place] [id number] [phone]        The results of significant diagnostics from this hospitalization (including imaging, microbiology, ancillary and laboratory) are listed below for reference.    Significant Diagnostic Studies: Dg Chest 2 View  10/10/2015  CLINICAL DATA:  Acute onset of shortness of breath and cough. Hypoglycemia. Initial encounter. EXAM: CHEST  2 VIEW COMPARISON:  Chest radiograph performed 12/02/2014 FINDINGS: The lungs are mildly hypoexpanded. Vascular congestion is noted, with increased interstitial markings, concerning for mild pulmonary edema. Pneumonia could have a similar appearance. Small bilateral pleural effusions are seen. No pneumothorax is seen. There is mild lateral pleural thickening bilaterally. The heart is mildly enlarged. No acute osseous abnormalities are seen. IMPRESSION: Lungs mildly hypoexpanded. Vascular congestion and mild cardiomegaly, with increased interstitial markings, concerning for mild pulmonary edema. Pneumonia could have a similar appearance. Small bilateral pleural effusions noted. Electronically Signed   By: Garald Balding M.D.   On: 10/10/2015 02:52     Microbiology: Recent Results (from the past 240 hour(s))  MRSA PCR Screening     Status: None   Collection Time: 10/10/15  6:42 PM  Result Value Ref Range Status   MRSA by PCR NEGATIVE NEGATIVE Final    Comment:        The GeneXpert MRSA Assay (FDA approved for NASAL specimens only), is one component of a comprehensive MRSA colonization surveillance program. It is not intended to diagnose MRSA infection nor to guide or monitor treatment for MRSA infections.      Labs: Basic Metabolic Panel:  Recent Labs Lab 10/10/15 0145 10/10/15 1356 10/11/15 0305  NA 135 134* 136  K 6.0* 6.0* 4.6  CL 93* 95* 100*  CO2 29 29 27   GLUCOSE 101* 211* 88  BUN 37* 40* 14  CREATININE 4.21* 5.08* 2.80*  CALCIUM 9.1 8.8* 8.7*   Liver Function Tests: No results for input(s): AST, ALT, ALKPHOS, BILITOT, PROT, ALBUMIN in the last 168 hours. No results for input(s): LIPASE, AMYLASE in the last 168 hours. No results for input(s): AMMONIA in the last 168 hours. CBC:  Recent Labs Lab 10/10/15 0145 10/11/15 0305  WBC 7.1 5.9  NEUTROABS 4.9  --   HGB 10.9* 9.5*  HCT 34.1* 30.4*  MCV 95.3 96.2  PLT 220 190   Cardiac Enzymes:  Recent Labs Lab 10/10/15 0145 10/10/15 0811  TROPONINI 0.36* 0.42*   BNP: BNP (last 3 results) No results for input(s): BNP in the last 8760 hours.  ProBNP (last 3 results) No results for input(s): PROBNP in the last 8760 hours.  CBG:  Recent Labs Lab 10/10/15 1249 10/10/15 1849 10/10/15 2143 10/11/15 0544 10/11/15 1105  GLUCAP 191* 118* 133* 94 128*       Signed:  Delfina Redwood MD [facility] 10/11/2015, 11:42 AM

## 2015-10-12 LAB — GLUCOSE, CAPILLARY
Glucose-Capillary: 133 mg/dL — ABNORMAL HIGH (ref 65–99)
Glucose-Capillary: 232 mg/dL — ABNORMAL HIGH (ref 65–99)

## 2016-01-18 ENCOUNTER — Inpatient Hospital Stay (HOSPITAL_COMMUNITY): Payer: Medicare Other

## 2016-01-18 ENCOUNTER — Emergency Department (HOSPITAL_COMMUNITY): Payer: Medicare Other

## 2016-01-18 ENCOUNTER — Encounter (HOSPITAL_COMMUNITY): Payer: Self-pay | Admitting: Emergency Medicine

## 2016-01-18 ENCOUNTER — Observation Stay (HOSPITAL_COMMUNITY): Payer: Medicare Other

## 2016-01-18 ENCOUNTER — Inpatient Hospital Stay (HOSPITAL_COMMUNITY)
Admission: EM | Admit: 2016-01-18 | Discharge: 2016-01-22 | DRG: 064 | Disposition: A | Discharge status: Skilled Nursing Facility | Payer: Medicare Other | Source: Skilled Nursing Facility | Attending: Oncology | Admitting: Oncology

## 2016-01-18 DIAGNOSIS — J810 Acute pulmonary edema: Secondary | ICD-10-CM | POA: Diagnosis present

## 2016-01-18 DIAGNOSIS — I63411 Cerebral infarction due to embolism of right middle cerebral artery: Secondary | ICD-10-CM | POA: Diagnosis present

## 2016-01-18 DIAGNOSIS — F329 Major depressive disorder, single episode, unspecified: Secondary | ICD-10-CM | POA: Diagnosis present

## 2016-01-18 DIAGNOSIS — E1122 Type 2 diabetes mellitus with diabetic chronic kidney disease: Secondary | ICD-10-CM | POA: Diagnosis present

## 2016-01-18 DIAGNOSIS — N2581 Secondary hyperparathyroidism of renal origin: Secondary | ICD-10-CM | POA: Diagnosis present

## 2016-01-18 DIAGNOSIS — Z7983 Long term (current) use of bisphosphonates: Secondary | ICD-10-CM

## 2016-01-18 DIAGNOSIS — I132 Hypertensive heart and chronic kidney disease with heart failure and with stage 5 chronic kidney disease, or end stage renal disease: Secondary | ICD-10-CM | POA: Diagnosis present

## 2016-01-18 DIAGNOSIS — Z993 Dependence on wheelchair: Secondary | ICD-10-CM

## 2016-01-18 DIAGNOSIS — I639 Cerebral infarction, unspecified: Secondary | ICD-10-CM | POA: Insufficient documentation

## 2016-01-18 DIAGNOSIS — K219 Gastro-esophageal reflux disease without esophagitis: Secondary | ICD-10-CM | POA: Diagnosis present

## 2016-01-18 DIAGNOSIS — R29810 Facial weakness: Secondary | ICD-10-CM | POA: Diagnosis present

## 2016-01-18 DIAGNOSIS — D638 Anemia in other chronic diseases classified elsewhere: Secondary | ICD-10-CM | POA: Diagnosis present

## 2016-01-18 DIAGNOSIS — J90 Pleural effusion, not elsewhere classified: Secondary | ICD-10-CM | POA: Diagnosis not present

## 2016-01-18 DIAGNOSIS — I953 Hypotension of hemodialysis: Secondary | ICD-10-CM | POA: Diagnosis not present

## 2016-01-18 DIAGNOSIS — R0902 Hypoxemia: Secondary | ICD-10-CM | POA: Diagnosis present

## 2016-01-18 DIAGNOSIS — Z79899 Other long term (current) drug therapy: Secondary | ICD-10-CM

## 2016-01-18 DIAGNOSIS — Z96653 Presence of artificial knee joint, bilateral: Secondary | ICD-10-CM | POA: Diagnosis present

## 2016-01-18 DIAGNOSIS — E785 Hyperlipidemia, unspecified: Secondary | ICD-10-CM | POA: Diagnosis present

## 2016-01-18 DIAGNOSIS — Z7982 Long term (current) use of aspirin: Secondary | ICD-10-CM

## 2016-01-18 DIAGNOSIS — I421 Obstructive hypertrophic cardiomyopathy: Secondary | ICD-10-CM | POA: Diagnosis present

## 2016-01-18 DIAGNOSIS — J811 Chronic pulmonary edema: Secondary | ICD-10-CM | POA: Diagnosis present

## 2016-01-18 DIAGNOSIS — R29898 Other symptoms and signs involving the musculoskeletal system: Secondary | ICD-10-CM | POA: Insufficient documentation

## 2016-01-18 DIAGNOSIS — IMO0001 Reserved for inherently not codable concepts without codable children: Secondary | ICD-10-CM | POA: Insufficient documentation

## 2016-01-18 DIAGNOSIS — Z66 Do not resuscitate: Secondary | ICD-10-CM | POA: Diagnosis present

## 2016-01-18 DIAGNOSIS — M109 Gout, unspecified: Secondary | ICD-10-CM | POA: Diagnosis present

## 2016-01-18 DIAGNOSIS — E119 Type 2 diabetes mellitus without complications: Secondary | ICD-10-CM

## 2016-01-18 DIAGNOSIS — I63131 Cerebral infarction due to embolism of right carotid artery: Secondary | ICD-10-CM | POA: Diagnosis not present

## 2016-01-18 DIAGNOSIS — I6359 Cerebral infarction due to unspecified occlusion or stenosis of other cerebral artery: Secondary | ICD-10-CM | POA: Diagnosis not present

## 2016-01-18 DIAGNOSIS — N186 End stage renal disease: Secondary | ICD-10-CM | POA: Diagnosis present

## 2016-01-18 DIAGNOSIS — I2510 Atherosclerotic heart disease of native coronary artery without angina pectoris: Secondary | ICD-10-CM | POA: Diagnosis present

## 2016-01-18 DIAGNOSIS — E8770 Fluid overload, unspecified: Secondary | ICD-10-CM | POA: Diagnosis present

## 2016-01-18 DIAGNOSIS — G8194 Hemiplegia, unspecified affecting left nondominant side: Secondary | ICD-10-CM | POA: Diagnosis present

## 2016-01-18 DIAGNOSIS — I10 Essential (primary) hypertension: Secondary | ICD-10-CM | POA: Insufficient documentation

## 2016-01-18 DIAGNOSIS — E7800 Pure hypercholesterolemia, unspecified: Secondary | ICD-10-CM | POA: Diagnosis present

## 2016-01-18 DIAGNOSIS — M6289 Other specified disorders of muscle: Secondary | ICD-10-CM | POA: Diagnosis not present

## 2016-01-18 DIAGNOSIS — Z794 Long term (current) use of insulin: Secondary | ICD-10-CM

## 2016-01-18 DIAGNOSIS — Z992 Dependence on renal dialysis: Secondary | ICD-10-CM | POA: Diagnosis not present

## 2016-01-18 DIAGNOSIS — M1000 Idiopathic gout, unspecified site: Secondary | ICD-10-CM | POA: Insufficient documentation

## 2016-01-18 DIAGNOSIS — I6789 Other cerebrovascular disease: Secondary | ICD-10-CM | POA: Diagnosis not present

## 2016-01-18 LAB — GLUCOSE, CAPILLARY: Glucose-Capillary: 128 mg/dL — ABNORMAL HIGH (ref 65–99)

## 2016-01-18 LAB — CBC WITH DIFFERENTIAL/PLATELET
BASOS ABS: 0.1 10*3/uL (ref 0.0–0.1)
BASOS PCT: 1 %
EOS ABS: 0.1 10*3/uL (ref 0.0–0.7)
EOS PCT: 1 %
HCT: 31.7 % — ABNORMAL LOW (ref 36.0–46.0)
Hemoglobin: 9.9 g/dL — ABNORMAL LOW (ref 12.0–15.0)
Lymphocytes Relative: 17 %
Lymphs Abs: 1.2 10*3/uL (ref 0.7–4.0)
MCH: 30.7 pg (ref 26.0–34.0)
MCHC: 31.2 g/dL (ref 30.0–36.0)
MCV: 98.4 fL (ref 78.0–100.0)
MONO ABS: 0.4 10*3/uL (ref 0.1–1.0)
MONOS PCT: 5 %
Neutro Abs: 5.6 10*3/uL (ref 1.7–7.7)
Neutrophils Relative %: 76 %
PLATELETS: 221 10*3/uL (ref 150–400)
RBC: 3.22 MIL/uL — ABNORMAL LOW (ref 3.87–5.11)
RDW: 14.7 % (ref 11.5–15.5)
WBC: 7.4 10*3/uL (ref 4.0–10.5)

## 2016-01-18 LAB — I-STAT TROPONIN, ED: Troponin i, poc: 0.4 ng/mL (ref 0.00–0.08)

## 2016-01-18 LAB — LIPASE, BLOOD: LIPASE: 31 U/L (ref 11–51)

## 2016-01-18 LAB — COMPREHENSIVE METABOLIC PANEL
ALT: 14 U/L (ref 14–54)
AST: 20 U/L (ref 15–41)
Albumin: 3 g/dL — ABNORMAL LOW (ref 3.5–5.0)
Alkaline Phosphatase: 78 U/L (ref 38–126)
Anion gap: 12 (ref 5–15)
BUN: 38 mg/dL — AB (ref 6–20)
CHLORIDE: 96 mmol/L — AB (ref 101–111)
CO2: 30 mmol/L (ref 22–32)
Calcium: 8.2 mg/dL — ABNORMAL LOW (ref 8.9–10.3)
Creatinine, Ser: 5.27 mg/dL — ABNORMAL HIGH (ref 0.44–1.00)
GFR calc Af Amer: 8 mL/min — ABNORMAL LOW (ref >60)
GFR, EST NON AFRICAN AMERICAN: 7 mL/min — AB (ref >60)
Glucose, Bld: 216 mg/dL — ABNORMAL HIGH (ref 65–99)
POTASSIUM: 4.6 mmol/L (ref 3.5–5.1)
Sodium: 138 mmol/L (ref 135–145)
Total Bilirubin: 0.4 mg/dL (ref 0.3–1.2)
Total Protein: 5.8 g/dL — ABNORMAL LOW (ref 6.5–8.1)

## 2016-01-18 LAB — TROPONIN I: TROPONIN I: 0.37 ng/mL — AB (ref <0.031)

## 2016-01-18 LAB — LIPID PANEL
CHOLESTEROL: 163 mg/dL (ref 0–200)
HDL: 64 mg/dL (ref >40)
LDL Cholesterol: 79 mg/dL (ref 0–99)
Total CHOL/HDL Ratio: 2.5 RATIO
Triglycerides: 100 mg/dL (ref <150)
VLDL: 20 mg/dL (ref 0–40)

## 2016-01-18 MED ORDER — HEPARIN SODIUM (PORCINE) 1000 UNIT/ML DIALYSIS: 1000.0000 [IU] | INTRAMUSCULAR | Status: DC | PRN

## 2016-01-18 MED ORDER — HEPARIN SODIUM (PORCINE) 5000 UNIT/ML IJ SOLN
5000.0000 [IU] | Freq: Three times a day (TID) | INTRAMUSCULAR | Status: DC
  Administered 2016-01-18 – 2016-01-22 (×11): 5000 [IU] via SUBCUTANEOUS
  Filled 2016-01-18 (×10): qty 1

## 2016-01-18 MED ORDER — AMITRIPTYLINE HCL 10 MG PO TABS
10.0000 mg | ORAL_TABLET | Freq: Every day | ORAL | Status: DC
  Administered 2016-01-18 – 2016-01-21 (×4): 10 mg via ORAL
  Filled 2016-01-18 (×5): qty 1

## 2016-01-18 MED ORDER — LIDOCAINE-PRILOCAINE 2.5-2.5 % EX CREA: 1.0000 "application " | TOPICAL_CREAM | CUTANEOUS | Status: DC | PRN

## 2016-01-18 MED ORDER — LIDOCAINE HCL (PF) 1 % IJ SOLN: 5.0000 mL | INTRAMUSCULAR | Status: DC | PRN

## 2016-01-18 MED ORDER — ATORVASTATIN CALCIUM 40 MG PO TABS
40.0000 mg | ORAL_TABLET | Freq: Every day | ORAL | Status: DC
  Administered 2016-01-18 – 2016-01-22 (×5): 40 mg via ORAL
  Filled 2016-01-18 (×5): qty 1

## 2016-01-18 MED ORDER — OXYCODONE HCL 5 MG PO TABS
5.0000 mg | ORAL_TABLET | Freq: Once | ORAL | Status: AC
  Administered 2016-01-18: 5 mg via ORAL
  Filled 2016-01-18: qty 1

## 2016-01-18 MED ORDER — ACETAMINOPHEN 325 MG PO TABS
650.0000 mg | ORAL_TABLET | Freq: Four times a day (QID) | ORAL | Status: DC | PRN
  Administered 2016-01-18 – 2016-01-20 (×5): 650 mg via ORAL
  Filled 2016-01-18 (×5): qty 2

## 2016-01-18 MED ORDER — SODIUM CHLORIDE 0.9% FLUSH
3.0000 mL | Freq: Two times a day (BID) | INTRAVENOUS | Status: DC
  Administered 2016-01-18 – 2016-01-22 (×8): 3 mL via INTRAVENOUS

## 2016-01-18 MED ORDER — INSULIN ASPART 100 UNIT/ML ~~LOC~~ SOLN: 0.0000 [IU] | Freq: Every day | SUBCUTANEOUS | Status: DC

## 2016-01-18 MED ORDER — ASPIRIN EC 81 MG PO TBEC
81.0000 mg | DELAYED_RELEASE_TABLET | Freq: Every day | ORAL | Status: DC
  Administered 2016-01-19: 81 mg via ORAL
  Filled 2016-01-18: qty 1

## 2016-01-18 MED ORDER — CALCITRIOL 0.5 MCG PO CAPS: 0.5000 ug | ORAL_CAPSULE | ORAL | Status: DC

## 2016-01-18 MED ORDER — INSULIN ASPART 100 UNIT/ML ~~LOC~~ SOLN
0.0000 [IU] | Freq: Three times a day (TID) | SUBCUTANEOUS | Status: DC
  Administered 2016-01-19 (×3): 1 [IU] via SUBCUTANEOUS
  Administered 2016-01-20: 3 [IU] via SUBCUTANEOUS
  Administered 2016-01-20: 1 [IU] via SUBCUTANEOUS
  Administered 2016-01-20 – 2016-01-22 (×4): 2 [IU] via SUBCUTANEOUS
  Administered 2016-01-22: 1 [IU] via SUBCUTANEOUS
  Administered 2016-01-22: 2 [IU] via SUBCUTANEOUS

## 2016-01-18 MED ORDER — ALTEPLASE 2 MG IJ SOLR: 2.0000 mg | Freq: Once | INTRAMUSCULAR | Status: DC | PRN

## 2016-01-18 MED ORDER — FEBUXOSTAT 40 MG PO TABS
40.0000 mg | ORAL_TABLET | Freq: Every day | ORAL | Status: DC
  Administered 2016-01-18 – 2016-01-21 (×4): 40 mg via ORAL
  Filled 2016-01-18 (×4): qty 1

## 2016-01-18 MED ORDER — ASPIRIN 81 MG PO CHEW
324.0000 mg | CHEWABLE_TABLET | Freq: Once | ORAL | Status: AC
  Administered 2016-01-18: 324 mg via ORAL
  Filled 2016-01-18: qty 4

## 2016-01-18 MED ORDER — SEVELAMER CARBONATE 2.4 G PO PACK
2.4000 g | PACK | Freq: Three times a day (TID) | ORAL | Status: DC
  Administered 2016-01-19 – 2016-01-22 (×9): 2.4 g via ORAL
  Filled 2016-01-18 (×12): qty 1

## 2016-01-18 MED ORDER — PENTAFLUOROPROP-TETRAFLUOROETH EX AERO: 1.0000 "application " | INHALATION_SPRAY | CUTANEOUS | Status: DC | PRN

## 2016-01-18 MED ORDER — SODIUM CHLORIDE 0.9 % IV SOLN: 100.0000 mL | INTRAVENOUS | Status: DC | PRN

## 2016-01-18 MED ORDER — CINACALCET HCL 30 MG PO TABS
30.0000 mg | ORAL_TABLET | Freq: Every day | ORAL | Status: DC
  Administered 2016-01-19 – 2016-01-22 (×3): 30 mg via ORAL
  Filled 2016-01-18 (×3): qty 1

## 2016-01-18 NOTE — ED Provider Notes (Signed)
CSN: [id number]     Arrival date & time 01/18/16  1053 History   First MD Initiated Contact with Patient 01/18/16 1102     Chief Complaint  Patient presents with  . Nausea     (Consider location/radiation/quality/duration/timing/severity/associated sxs/prior Treatment) HPI  82 year old female presents with nausea, right shoulder pain, and left knee pain. Patient states when she woke up she had right shoulder pain. It did not wake her up. She does not remember an injury. On further questioning she did end up having to push her self up in bed but does not remember injuring her shoulder last night. She nauseated while eating breakfast this morning. When nursing staff was trying to help her out of bed they twisted her left knee. She has chronic bilateral knee pain. She was given 8 mg Zofran prior to arrival and states the nausea has resolved. There was no abdominal pain. There is no chest pain or shortness of breath. Patient is due for dialysis today at 11:30 (MWF dialysis). No weakness/numbness  No reports of dyspnea but patient's O2 sat decreases to 84% on room air while at rest. Comes up with supplemental [place] oxygen  Past Medical History  Diagnosis Date  . Type 2 diabetes mellitus (HCC)     mellitus? x25 years  . Essential hypertension   . Hypercholesterolemia   . ESRD (end stage renal disease) ([place])   . Anemia of chronic disease   . CAD (coronary artery disease)     Nonobstructive   . GERD (gastroesophageal reflux disease)   . HOCM (hypertrophic obstructive cardiomyopathy) ([place])   . Hypoglycemia 09/2015   Past Surgical History  Procedure Laterality Date  . Total knee arthroplasty      L-2006; R-2008   . Appendectomy    . Transthoracic echocardiogram  05/2011  . Av fistula placement  07/07/2012    Procedure: ARTERIOVENOUS (AV) FISTULA CREATION;  Surgeon: Mal Misty, MD;  Location: [facility] OR;  Service: Vascular;  Laterality: Right;  Brachial - cephalic arterivenous fistula  .  Esophagogastroduodenoscopy (egd) with esophageal dilation N/A 10/12/2013    Procedure: ESOPHAGOGASTRODUODENOSCOPY (EGD) WITH ESOPHAGEAL DILATION;  Surgeon: Lafayette Dragon, MD;  Location: [facility] ENDOSCOPY;  Service: Endoscopy;  Laterality: N/A;  . Savory dilation N/A 10/12/2013    Procedure: SAVORY DILATION;  Surgeon: Lafayette Dragon, MD;  Location: [facility] ENDOSCOPY;  Service: Endoscopy;  Laterality: N/A;  . Back surgery    . Fracture surgery     Family History  Problem Relation Age of Onset  . Heart attack Father   . Stroke Mother    Social History  Substance Use Topics  . Smoking status: Never Smoker   . Smokeless tobacco: Never Used     Comment: no smoking   . Alcohol Use: No   OB History    No data available     Review of Systems  Respiratory: Negative for shortness of breath.   Cardiovascular: Negative for chest pain.  Gastrointestinal: Positive for nausea. Negative for vomiting and abdominal pain.  Musculoskeletal: Positive for arthralgias. Negative for joint swelling.  Neurological: Negative for weakness and numbness.  All other systems reviewed and are negative.     Allergies  Review of patient's allergies indicates no known allergies.  Home Medications   Prior to Admission medications   Medication Sig Start Date End Date Taking? Authorizing Provider  acetaminophen (TYLENOL) 500 MG tablet Take 1,000 mg by mouth every 6 (six) hours as needed (pain).    Historical Provider,  MD  albuterol (PROVENTIL HFA;VENTOLIN HFA) 108 (90 Base) MCG/ACT inhaler Inhale 2 puffs into the lungs every 4 (four) hours as needed for wheezing or shortness of breath.    Historical Provider, MD  alendronate (FOSAMAX) 70 MG tablet Take 70 mg by mouth once a week. Take with a full glass of water on an empty stomach.    Historical Provider, MD  amitriptyline (ELAVIL) 10 MG tablet Take 10 mg by mouth at bedtime.     Historical Provider, MD  aspirin EC 81 MG tablet Take 81 mg by mouth daily.    Historical  Provider, MD  atorvastatin (LIPITOR) 20 MG tablet Take 1 tablet (20 mg total) by mouth daily. 11/11/13   Velvet Bathe, MD  b complex-vitamin c-folic acid (NEPHRO-VITE) 0.8 MG TABS tablet Take 1 tablet by mouth at bedtime.    Historical Provider, MD  cinacalcet (SENSIPAR) 30 MG tablet Take 30 mg by mouth daily with supper.    Historical Provider, MD  docusate sodium (COLACE) 100 MG capsule Take 100 mg by mouth daily.    Historical Provider, MD  febuxostat (ULORIC) 40 MG tablet Take 40 mg by mouth at bedtime.    Historical Provider, MD  feeding supplement, GLUCERNA SHAKE, (GLUCERNA SHAKE) LIQD Take 237 mLs by mouth 2 (two) times daily between meals. 11/11/13   Velvet Bathe, MD  fexofenadine (ALLEGRA) 180 MG tablet Take 180 mg by mouth daily as needed for allergies or rhinitis.    Historical Provider, MD  fish oil-omega-3 fatty acids 1000 MG capsule Take 1 g by mouth daily.     Historical Provider, MD  Glucosamine-Chondroitin 250-200 MG CAPS Take 1 capsule by mouth daily.      Historical Provider, MD  guaiFENesin (ROBITUSSIN) 100 MG/5ML liquid Take 200 mg by mouth every 6 (six) hours as needed for cough.    Historical Provider, MD  insulin detemir (LEVEMIR) 100 UNIT/ML injection Inject 0.03 mLs (3 Units total) into the skin daily. 10/11/15   Delfina Redwood, MD  insulin regular (NOVOLIN R,HUMULIN R) 100 units/mL injection Inject 0-0.12 mLs (0-12 Units total) into the skin 3 (three) times daily before meals. Per sliding scale: 0-150=0 units, 151-200 = 1 units, 201-250 = 2 units, 251-300 = 3 units, 301-350 = 4 units, 351-400 = 5 units, 401-450 = 6 units, > 450 CALL MD. 10/11/15   Delfina Redwood, MD  lansoprazole (PREVACID) 30 MG capsule Take 30 mg by mouth daily at 12 noon.    Historical Provider, MD  lanthanum (FOSRENOL) 500 MG chewable tablet Chew 1,000 mg by mouth 3 (three) times daily with meals.     Historical Provider, MD  magnesium hydroxide (MILK OF MAGNESIA) 400 MG/5ML suspension Take 30 mLs by  mouth daily as needed for mild constipation.    Historical Provider, MD  nystatin (MYCOSTATIN/NYSTOP) 100000 UNIT/GM POWD Apply 1 g topically every 8 (eight) hours as needed (yeastlike areas).     Historical Provider, MD  ondansetron (ZOFRAN) 8 MG tablet Take 8 mg by mouth 3 (three) times daily as needed for nausea or vomiting.    Historical Provider, MD  OxyCODONE HCl, Abuse Deter, 5 MG TABA Take 5 mg by mouth every 4 (four) hours as needed (pain).     Historical Provider, MD  phenol (CHLORASEPTIC) 1.4 % LIQD Use as directed 1 spray in the mouth or throat 3 (three) times daily as needed for throat irritation / pain.    Historical Provider, MD  polyethylene glycol (MIRALAX / GLYCOLAX)  packet Take 17 g by mouth at bedtime.    Historical Provider, MD  polyvinyl alcohol (LIQUIFILM TEARS) 1.4 % ophthalmic solution Place 2 drops into both eyes 4 (four) times daily.    Historical Provider, MD  traMADol (ULTRAM) 50 MG tablet Take 1 tablet (50 mg total) by mouth every 6 (six) hours as needed (pain). Patient taking differently: Take 50 mg by mouth 4 (four) times daily as needed (pain).  11/11/13   Velvet Bathe, MD  vitamin B-12 (CYANOCOBALAMIN) 1000 MCG tablet Take 1,000 mcg by mouth daily.    Historical Provider, MD   BP 119/83 mmHg  Pulse 78  Temp(Src) 98.9 F (37.2 C) (Oral)  Resp 24  Ht 5\' 4"  (1.626 m)  Wt 150 lb (68.04 kg)  BMI 25.73 kg/m2  SpO2 91%  LMP  (LMP Unknown) Physical Exam  Constitutional: She is oriented to person, place, and time. She appears well-developed and well-nourished.  HENT:  Head: Normocephalic and atraumatic.  Right Ear: External ear normal.  Left Ear: External ear normal.  Nose: Nose normal.  Eyes: Right eye exhibits no discharge. Left eye exhibits no discharge.  Neck: Neck supple.  Cardiovascular: Normal rate, regular rhythm, normal heart sounds and intact distal pulses.   Pulses:      Radial pulses are 2+ on the right side, and 2+ on the left side.       Dorsalis  pedis pulses are 2+ on the right side, and 2+ on the left side.  Pulmonary/Chest: Effort normal and breath sounds normal.  Abdominal: Soft. There is no tenderness.  Musculoskeletal:       Right shoulder: She exhibits tenderness (mild). She exhibits normal range of motion, no swelling and no deformity.       Left knee: Tenderness found.       Arms:      Legs: Neurological: She is alert and oriented to person, place, and time.  Skin: Skin is warm and dry.  Nursing note and vitals reviewed.   ED Course  Procedures (including critical care time) Labs Review Labs Reviewed  COMPREHENSIVE METABOLIC PANEL - Abnormal; Notable for the following:    Chloride 96 (*)    Glucose, Bld 216 (*)    BUN 38 (*)    Creatinine, Ser 5.27 (*)    Calcium 8.2 (*)    Total Protein 5.8 (*)    Albumin 3.0 (*)    GFR calc non Af Amer 7 (*)    GFR calc Af Amer 8 (*)    All other components within normal limits  CBC WITH DIFFERENTIAL/PLATELET - Abnormal; Notable for the following:    RBC 3.22 (*)    Hemoglobin 9.9 (*)    HCT 31.7 (*)    All other components within normal limits  I-STAT TROPOININ, ED - Abnormal; Notable for the following:    Troponin i, poc 0.40 (*)    All other components within normal limits  LIPASE, BLOOD    Imaging Review Dg Chest 2 View  01/18/2016  CLINICAL DATA:  Shortness of breath.  Low O2 saturation. EXAM: CHEST  2 VIEW COMPARISON:  10/10/2015 and 11/14/2013 and 12/02/2014 FINDINGS: There is cardiomegaly with slight pulmonary vascular congestion. There is diffuse accentuation of the interstitial markings which I suspect represents pulmonary edema. Dense calcification in the mitral valve annulus. No acute bone abnormality. Tiny bilateral effusions. Extensive calcification in the aorta. IMPRESSION: Probable congestive heart failure with slight pulmonary edema and small effusions. Aortic atherosclerosis. Electronically Signed  By: Lorriane Shire M.D.   On: 01/18/2016 12:09   Dg  Shoulder Right  01/18/2016  CLINICAL DATA:  Right shoulder pain. EXAM: RIGHT SHOULDER - 2+ VIEW COMPARISON:  None. FINDINGS: Diffuse osteopenia. No fracture or dislocation. No abnormal soft tissue calcification. Minimal degenerative changes at the acromioclavicular joint. Note is made of accentuation of the interstitial markings in the right lung, probably chronic. IMPRESSION: No acute abnormality of the right shoulder. Electronically Signed   By: Lorriane Shire M.D.   On: 01/18/2016 12:05   Dg Knee Complete 4 Views Left  01/18/2016  CLINICAL DATA:  Left knee pain. EXAM: LEFT KNEE - COMPLETE 4+ VIEW COMPARISON:  04/30/2005 FINDINGS: The components of the total knee prosthesis appear in good position with no evidence of loosening. There is no fracture or dislocation or joint effusion. Diffuse osteopenia. IMPRESSION: No acute abnormality. Electronically Signed   By: Lorriane Shire M.D.   On: 01/18/2016 12:03   I have personally reviewed and evaluated these images and lab results as part of my medical decision-making.   EKG Interpretation   Date/Time:  Friday January 18 2016 11:20:00 EDT Ventricular Rate:  79 PR Interval:  223 QRS Duration: 108 QT Interval:  404 QTC Calculation: 463 R Axis:   61 Text Interpretation:  Sinus rhythm Prolonged PR interval Probable LVH with  secondary repol abnrm Anterior Q waves, possibly due to LVH Baseline  wander in lead(s) V1 no significant change since Jan 2017 Confirmed by  Regenia Skeeter  MD, [place] [phone]) on 01/18/2016 11:23:39 AM Also confirmed by  Regenia Skeeter  MD, Cylus [place] ([id number]), editor [place], Leda Gauze [phone])  on 01/18/2016  11:59:10 AM      MDM   Final diagnoses:  Acute pulmonary edema (HCC)  Hypoxia    No obvious cause for the patient's symptoms. She endorses that she feels overall weak and she had pushed her self up last night and maybe she injured her shoulder then. However no x-ray abnormalities. She denies chest pain. However she is hypoxic, goes down  to 84% when removed from oxygen. She does not chronically wear oxygen. Thus x-ray was obtained and shows pulmonary edema. Given today is Friday and she has missed her dialysis at think she will need to be admitted for dialysis and further monitoring of her oxygen. She is currently stable. Admit to the internal medicine teaching service. Nephrology has been consulted.    Sherwood Gambler, MD 01/18/16 1357

## 2016-01-18 NOTE — ED Notes (Signed)
Family at bedside and updated

## 2016-01-18 NOTE — Consult Note (Signed)
[facility] Renal Consultation Note    Indication for Consultation:  Management of ESRD/hemodialysis; anemia, hypertension/volume and secondary hyperparathyroidism PCP:  HPI: [name] [name] is a 82 y.o. female with ESRD(MWF [place]) secondary to DM/HTN on HD since February 2015 with a history of hypertrophic obstructive CM, esophogeal stricture, L2 compression fracture s/p kyphoplasty, nonobstructive CAD who was brought to the ED today.  This am after eating she had nausea not relieved with antinausea med. She had diarrhea, no fever or vomiting. She denies CP, or SOB. She c/o left knee pain (chronic but worse than usual) and right shoulder pain. She is overall weaker than usual and moreso on the left side.  She makes urine, though not as much as usual. Her appetite is poor. She tolerates outpt HD without problems. Pt resides in ALF and the staff told her daughter that "she doesn't seem quite right."  The daughter said the pt's O2 sats where in the 80s when picked up by EMS.  Evaluation in the ED temp 98.9 WBC 7.4 -normal diff hgb 9.9 K 4.6 CXR slight pul edema/small effusions. Shoulder and knee films neg acute findings Initial troponin 0.4 EKG no acute change from January 2017.  Last Echo was 11/2014 EF 60%.  She is due for dialysis today.  Past Medical History  Diagnosis Date  . Type 2 diabetes mellitus (HCC)     mellitus? x25 years  . Essential hypertension   . Hypercholesterolemia   . ESRD (end stage renal disease) ([place])   . Anemia of chronic disease   . CAD (coronary artery disease)     Nonobstructive   . GERD (gastroesophageal reflux disease)   . HOCM (hypertrophic obstructive cardiomyopathy) ([place])   . Hypoglycemia 09/2015   Past Surgical History  Procedure Laterality Date  . Total knee arthroplasty      L-2006; R-2008   . Appendectomy    . Transthoracic echocardiogram  05/2011  . Av fistula placement  07/07/2012    Procedure: ARTERIOVENOUS (AV) FISTULA CREATION;   Surgeon: Mal Misty, MD;  Location: [facility] OR;  Service: Vascular;  Laterality: Right;  Brachial - cephalic arterivenous fistula  . Esophagogastroduodenoscopy (egd) with esophageal dilation N/A 10/12/2013    Procedure: ESOPHAGOGASTRODUODENOSCOPY (EGD) WITH ESOPHAGEAL DILATION;  Surgeon: Lafayette Dragon, MD;  Location: [facility] ENDOSCOPY;  Service: Endoscopy;  Laterality: N/A;  . Savory dilation N/A 10/12/2013    Procedure: SAVORY DILATION;  Surgeon: Lafayette Dragon, MD;  Location: [facility] ENDOSCOPY;  Service: Endoscopy;  Laterality: N/A;  . Back surgery    . Fracture surgery     Family History  Problem Relation Age of Onset  . Heart attack Father   . Stroke Mother    Social History:  reports that she has never smoked. She has never used smokeless tobacco. She reports that she does not drink alcohol or use illicit drugs. No Known Allergies Prior to Admission medications   Medication Sig Start Date End Date Taking? Authorizing Provider  acetaminophen (TYLENOL) 325 MG tablet Take 650 mg by mouth every 6 (six) hours as needed for mild pain.   Yes Historical Provider, MD  acetaminophen (TYLENOL) 500 MG tablet Take 1,000 mg by mouth every 6 (six) hours as needed (pain).   Yes Historical Provider, MD  albuterol (PROVENTIL HFA;VENTOLIN HFA) 108 (90 Base) MCG/ACT inhaler Inhale 2 puffs into the lungs every 4 (four) hours as needed for wheezing or shortness of breath.   Yes Historical Provider, MD  alendronate (FOSAMAX) 70 MG  tablet Take 70 mg by mouth once a week. Take with a full glass of water on an empty stomach.   Yes Historical Provider, MD  amitriptyline (ELAVIL) 10 MG tablet Take 10 mg by mouth at bedtime.    Yes Historical Provider, MD  aspirin EC 81 MG tablet Take 81 mg by mouth daily.   Yes Historical Provider, MD  atorvastatin (LIPITOR) 20 MG tablet Take 1 tablet (20 mg total) by mouth daily. 11/11/13  Yes Velvet Bathe, MD  b complex-vitamin c-folic acid (NEPHRO-VITE) 0.8 MG TABS tablet Take 1 tablet by  mouth at bedtime.   Yes Historical Provider, MD  cinacalcet (SENSIPAR) 30 MG tablet Take 30 mg by mouth daily with supper.   Yes Historical Provider, MD  docusate sodium (COLACE) 100 MG capsule Take 100 mg by mouth daily.   Yes Historical Provider, MD  febuxostat (ULORIC) 40 MG tablet Take 40 mg by mouth at bedtime.   Yes Historical Provider, MD  feeding supplement, GLUCERNA SHAKE, (GLUCERNA SHAKE) LIQD Take 237 mLs by mouth 2 (two) times daily between meals. 11/11/13  Yes Velvet Bathe, MD  fexofenadine (ALLEGRA) 180 MG tablet Take 180 mg by mouth daily as needed for allergies or rhinitis.   Yes Historical Provider, MD  fish oil-omega-3 fatty acids 1000 MG capsule Take 1 g by mouth daily.    Yes Historical Provider, MD  geriatric multivitamins-minerals (ELDERTONIC/GEVRABON) ELIX Take 15 mLs by mouth daily.   Yes Historical Provider, MD  Glucosamine-Chondroitin 250-200 MG CAPS Take 1 capsule by mouth daily.     Yes Historical Provider, MD  guaiFENesin (ROBITUSSIN) 100 MG/5ML liquid Take 200 mg by mouth every 6 (six) hours as needed for cough.   Yes Historical Provider, MD  insulin detemir (LEVEMIR) 100 UNIT/ML injection Inject 0.03 mLs (3 Units total) into the skin daily. 10/11/15  Yes Delfina Redwood, MD  insulin regular (NOVOLIN R,HUMULIN R) 100 units/mL injection Inject 0-0.12 mLs (0-12 Units total) into the skin 3 (three) times daily before meals. Per sliding scale: 0-150=0 units, 151-200 = 1 units, 201-250 = 2 units, 251-300 = 3 units, 301-350 = 4 units, 351-400 = 5 units, 401-450 = 6 units, > 450 CALL MD. 10/11/15  Yes Delfina Redwood, MD  lansoprazole (PREVACID) 30 MG capsule Take 30 mg by mouth daily at 12 noon.   Yes Historical Provider, MD  magnesium hydroxide (MILK OF MAGNESIA) 400 MG/5ML suspension Take 30 mLs by mouth daily as needed for mild constipation.   Yes Historical Provider, MD  nystatin (MYCOSTATIN/NYSTOP) 100000 UNIT/GM POWD Apply 1 g topically every 8 (eight) hours as needed  (yeastlike areas).    Yes Historical Provider, MD  ondansetron (ZOFRAN) 8 MG tablet Take 8 mg by mouth 3 (three) times daily as needed for nausea or vomiting.   Yes Historical Provider, MD  OxyCODONE HCl, Abuse Deter, 5 MG TABA Take 5 mg by mouth every 4 (four) hours as needed (pain).    Yes Historical Provider, MD  phenol (CHLORASEPTIC) 1.4 % LIQD Use as directed 1 spray in the mouth or throat 3 (three) times daily as needed for throat irritation / pain.   Yes Historical Provider, MD  polyethylene glycol (MIRALAX / GLYCOLAX) packet Take 17 g by mouth at bedtime.   Yes Historical Provider, MD  polyvinyl alcohol (LIQUIFILM TEARS) 1.4 % ophthalmic solution Place 2 drops into both eyes 4 (four) times daily.   Yes Historical Provider, MD  sevelamer carbonate (RENVELA) 2.4 g PACK Take 2.4  g by mouth 3 (three) times daily with meals.   Yes Historical Provider, MD  vitamin B-12 (CYANOCOBALAMIN) 1000 MCG tablet Take 1,000 mcg by mouth daily.   Yes Historical Provider, MD  lanthanum (FOSRENOL) 500 MG chewable tablet Chew 1,000 mg by mouth 3 (three) times daily with meals.     Historical Provider, MD  traMADol (ULTRAM) 50 MG tablet Take 1 tablet (50 mg total) by mouth every 6 (six) hours as needed (pain). Patient taking differently: Take 50 mg by mouth 4 (four) times daily as needed (pain).  11/11/13   Velvet Bathe, MD   No current facility-administered medications for this encounter.   Current Outpatient Prescriptions  Medication Sig Dispense Refill  . acetaminophen (TYLENOL) 325 MG tablet Take 650 mg by mouth every 6 (six) hours as needed for mild pain.    Marland Kitchen acetaminophen (TYLENOL) 500 MG tablet Take 1,000 mg by mouth every 6 (six) hours as needed (pain).    Marland Kitchen albuterol (PROVENTIL HFA;VENTOLIN HFA) 108 (90 Base) MCG/ACT inhaler Inhale 2 puffs into the lungs every 4 (four) hours as needed for wheezing or shortness of breath.    Marland Kitchen alendronate (FOSAMAX) 70 MG tablet Take 70 mg by mouth once a week. Take with  a full glass of water on an empty stomach.    Marland Kitchen amitriptyline (ELAVIL) 10 MG tablet Take 10 mg by mouth at bedtime.     Marland Kitchen aspirin EC 81 MG tablet Take 81 mg by mouth daily.    Marland Kitchen atorvastatin (LIPITOR) 20 MG tablet Take 1 tablet (20 mg total) by mouth daily. 30 tablet 0  . b complex-vitamin c-folic acid (NEPHRO-VITE) 0.8 MG TABS tablet Take 1 tablet by mouth at bedtime.    . cinacalcet (SENSIPAR) 30 MG tablet Take 30 mg by mouth daily with supper.    . docusate sodium (COLACE) 100 MG capsule Take 100 mg by mouth daily.    . febuxostat (ULORIC) 40 MG tablet Take 40 mg by mouth at bedtime.    . feeding supplement, GLUCERNA SHAKE, (GLUCERNA SHAKE) LIQD Take 237 mLs by mouth 2 (two) times daily between meals. 30 Can 0  . fexofenadine (ALLEGRA) 180 MG tablet Take 180 mg by mouth daily as needed for allergies or rhinitis.    . fish oil-omega-3 fatty acids 1000 MG capsule Take 1 g by mouth daily.     Marland Kitchen geriatric multivitamins-minerals (ELDERTONIC/GEVRABON) ELIX Take 15 mLs by mouth daily.    . Glucosamine-Chondroitin 250-200 MG CAPS Take 1 capsule by mouth daily.      Marland Kitchen guaiFENesin (ROBITUSSIN) 100 MG/5ML liquid Take 200 mg by mouth every 6 (six) hours as needed for cough.    . insulin detemir (LEVEMIR) 100 UNIT/ML injection Inject 0.03 mLs (3 Units total) into the skin daily. 10 mL 11  . insulin regular (NOVOLIN R,HUMULIN R) 100 units/mL injection Inject 0-0.12 mLs (0-12 Units total) into the skin 3 (three) times daily before meals. Per sliding scale: 0-150=0 units, 151-200 = 1 units, 201-250 = 2 units, 251-300 = 3 units, 301-350 = 4 units, 351-400 = 5 units, 401-450 = 6 units, > 450 CALL MD. 10 mL 11  . lansoprazole (PREVACID) 30 MG capsule Take 30 mg by mouth daily at 12 noon.    . magnesium hydroxide (MILK OF MAGNESIA) 400 MG/5ML suspension Take 30 mLs by mouth daily as needed for mild constipation.    Marland Kitchen nystatin (MYCOSTATIN/NYSTOP) 100000 UNIT/GM POWD Apply 1 g topically every 8 (eight) hours as  needed (  yeastlike areas).     . ondansetron (ZOFRAN) 8 MG tablet Take 8 mg by mouth 3 (three) times daily as needed for nausea or vomiting.    . OxyCODONE HCl, Abuse Deter, 5 MG TABA Take 5 mg by mouth every 4 (four) hours as needed (pain).     . phenol (CHLORASEPTIC) 1.4 % LIQD Use as directed 1 spray in the mouth or throat 3 (three) times daily as needed for throat irritation / pain.    . polyethylene glycol (MIRALAX / GLYCOLAX) packet Take 17 g by mouth at bedtime.    . polyvinyl alcohol (LIQUIFILM TEARS) 1.4 % ophthalmic solution Place 2 drops into both eyes 4 (four) times daily.    . sevelamer carbonate (RENVELA) 2.4 g PACK Take 2.4 g by mouth 3 (three) times daily with meals.    . vitamin B-12 (CYANOCOBALAMIN) 1000 MCG tablet Take 1,000 mcg by mouth daily.    Marland Kitchen lanthanum (FOSRENOL) 500 MG chewable tablet Chew 1,000 mg by mouth 3 (three) times daily with meals.     . traMADol (ULTRAM) 50 MG tablet Take 1 tablet (50 mg total) by mouth every 6 (six) hours as needed (pain). (Patient taking differently: Take 50 mg by mouth 4 (four) times daily as needed (pain). ) 30 tablet 0   Labs: Basic Metabolic Panel:  Recent Labs Lab 01/18/16 1210  NA 138  K 4.6  CL 96*  CO2 30  GLUCOSE 216*  BUN 38*  CREATININE 5.27*  CALCIUM 8.2*   Liver Function Tests:  Recent Labs Lab 01/18/16 1210  AST 20  ALT 14  ALKPHOS 78  BILITOT 0.4  PROT 5.8*  ALBUMIN 3.0*    Recent Labs Lab 01/18/16 1210  LIPASE 31   CBC:  Recent Labs Lab 01/18/16 1210  WBC 7.4  NEUTROABS 5.6  HGB 9.9*  HCT 31.7*  MCV 98.4  PLT 221   Studies/Results: Dg Chest 2 View  01/18/2016  CLINICAL DATA:  Shortness of breath.  Low O2 saturation. EXAM: CHEST  2 VIEW COMPARISON:  10/10/2015 and 11/14/2013 and 12/02/2014 FINDINGS: There is cardiomegaly with slight pulmonary vascular congestion. There is diffuse accentuation of the interstitial markings which I suspect represents pulmonary edema. Dense calcification in the  mitral valve annulus. No acute bone abnormality. Tiny bilateral effusions. Extensive calcification in the aorta. IMPRESSION: Probable congestive heart failure with slight pulmonary edema and small effusions. Aortic atherosclerosis. Electronically Signed   By: Lorriane Shire M.D.   On: 01/18/2016 12:09   Dg Shoulder Right  01/18/2016  CLINICAL DATA:  Right shoulder pain. EXAM: RIGHT SHOULDER - 2+ VIEW COMPARISON:  None. FINDINGS: Diffuse osteopenia. No fracture or dislocation. No abnormal soft tissue calcification. Minimal degenerative changes at the acromioclavicular joint. Note is made of accentuation of the interstitial markings in the right lung, probably chronic. IMPRESSION: No acute abnormality of the right shoulder. Electronically Signed   By: Lorriane Shire M.D.   On: 01/18/2016 12:05   Dg Knee Complete 4 Views Left  01/18/2016  CLINICAL DATA:  Left knee pain. EXAM: LEFT KNEE - COMPLETE 4+ VIEW COMPARISON:  04/30/2005 FINDINGS: The components of the total knee prosthesis appear in good position with no evidence of loosening. There is no fracture or dislocation or joint effusion. Diffuse osteopenia. IMPRESSION: No acute abnormality. Electronically Signed   By: Lorriane Shire M.D.   On: 01/18/2016 12:03    ROS: As per HPI. Somewhat vague historian. Physical Exam: Filed Vitals:   01/18/16 1059  BP:  119/83  Pulse: 78  Temp: 98.9 F (37.2 C)  TempSrc: Oral  Resp: 24  Height: 5\' 4"  (1.626 m)  Weight: 68.04 kg (150 lb)  SpO2: 91%     General: elderly lady on stretcher in NAD on O2 sats mid to upper 90s  Head: Normocephalic, atraumatic, sclera non-icteric, mucus membranes are moist Neck: Supple. JVD not elevated. Lungs: Clear no rales, dim bases. Breathing is unlabored. Heart: RRR 2/6 murmur Abdomen: Soft, non-tender, non-distended with normoactive bowel sounds. M-S:  Strength and tone appear normal for age. Lower extremities: wthout edema or ischemic changes, no open wounds  Neuro:  Alert and oriented X 3. Moves all extremities spontaneously. Right upper and lower strength 5/5, left side 3/5 especially grip (and she is left handed) Psych:  Responds to questions appropriately with a normal affect. Dialysis Access:right upper AVF + bruit  Dialysis Orders: [facility] MWF 3.5 hr 160 400/A 1.5 2 K 2.25 Ca EDW 66.5 right upper AVF heparin 2000 no Fe mircera 30 q 4 weeks - last 4/12 calcitriol 0.5 good compliance with treatments gains < 2.5 Recent labs: Hgb 10.3 31% sat feritin 1516 iPTH 416 Ca/P ok  Assessment/Plan: 1. left sided weakness - new per daughter - head CT has been ordered. 2. Hypoxia - improved with O2; mild volume on CXR, try for 2 L today - may need extra tmt Saturday for further lowering of EDW; suspect she may have had unintentional weight loss 3. ESRD -  MWF - HD today usual orders- hold heparin for now pending head CT result 4. Hypertension/volume  - BP ok, some volume on CXR but sats were low before O2- gets to edw - may need to be lowered; unable to do standing weights - set goal L; she may benefit by an extra tmt for UF tomorrow instead of trying to remove a large volume at once given her advanced age 4. Anemia  - hgb slightly lower, no change in ESA yet 6. Metabolic bone disease -  Continue calcitriol- renvela powder and sensipar 30 on outpt med list (she doesn't need fosrenol and renvela powder); Review of her med list shows that she has been on alendronate. Focus of management should be on controlling secondary hyperparathyroidism without oversuppression.  Bisphosphonates are contraindicated and should be discontinued. 7. Nutrition - changed to  renal carb mod/vit (heart healthy diets high in K) 8. DM - per primary  Myriam Jacobson, PA-C [facility] [phone] 01/18/2016, 1:51 PM

## 2016-01-18 NOTE — ED Notes (Addendum)
Pt sent from [facility] after having an episode of nausea this am. Pt received 8mg  of Zofran before being transported. Pt has no nausea at this time. Pt states the nursing staff helped her get out of bed this am and she "twisted her left knee" pt c/o of pain in left knee. Pt is a dialysis pt and is MWF. Graft in right upper arm.

## 2016-01-18 NOTE — Procedures (Signed)
Patient seen on Hemodialysis. QB 350, UF goal 1.5L Treatment adjusted as needed.  Elmarie Shiley MD [facility]. Office # [phone] Pager # [phone] 4:58 PM

## 2016-01-18 NOTE — ED Notes (Signed)
Patient transported to X-ray 

## 2016-01-18 NOTE — H&P (Signed)
Date: 01/18/2016               Patient Name:  [name] [name] MRN: [id number]  DOB: 1926-06-24 Age / Sex: 82 y.o., female   PCP: Myer Peer, MD         Medical Service: Internal Medicine Teaching Service         Attending Physician: Dr. Aldine Contes, MD    First Contact: Dr. Marlowe Sax  Pager: [phone]  Second Contact: Dr. Arcelia Jew Pager: [phone]       After Hours (After 5p/  First Contact Pager: [phone]  weekends / holidays): Second Contact Pager: [phone]   Chief Complaint: "my butt felt heavy"  History of Present Illness: Patient is a 82 yo F with a PMHx of DM2, HTN, HLD, ESRD on HD, CAD, HOCM, and GERD presenting to the hospital from her assisted living facility. Patient states she came into the hospital because "my butt felt heavy" early this morning. Remainder of history was provided by daughter at bedside. Daughter states patient is having pain in her R shoulder, L knee, and her L hand is "limp." States patient has has surgeries done on both her shoulders and both her knees in the past. States she was told patient could not use her commode at the nursing home this morning because she felt weak; she could not use her arms to hold on to the rails before sitting on the commode. Normally she uses a wheelchair to ambulate. States she visited the patient yesterday and she was in her usual state of health at that time. Patient has also been complaining of nausea since this morning and coughed up some phlegm. She has been having decreased appetite since she moved into the ALF several years ago and daughter believes she lost 60 lbs in the past 2 years. She is not aware of the patient having any fevers, chills, or cough in the past few days. Although, she has been complaining of "twitching" back pain on occasion.  Patient reports having chest pain a few nights ago but could not give [place] any more details. States she uses 1 pillow to sleep at night. Daughter denies noticing any lower extremity  edema. Patient denies any vision changes or headaches. Denies any history of seizures.   Meds: Current Facility-Administered Medications  Medication Dose Route Frequency Provider Last Rate Last Dose  . 0.9 %  sodium chloride infusion  100 mL Intravenous PRN Alric Seton, PA-C      . 0.9 %  sodium chloride infusion  100 mL Intravenous PRN Alric Seton, PA-C      . alteplase (CATHFLO ACTIVASE) injection 2 mg  2 mg Intracatheter Once PRN Alric Seton, PA-C      . calcitRIOL (ROCALTROL) capsule 0.5 mcg  0.5 mcg Oral Q M,W,F-HD Alric Seton, PA-C      . heparin injection 1,000 Units  1,000 Units Dialysis PRN Alric Seton, PA-C      . lidocaine (PF) (XYLOCAINE) 1 % injection 5 mL  5 mL Intradermal PRN Alric Seton, PA-C      . lidocaine-prilocaine (EMLA) cream 1 application  1 application Topical PRN Alric Seton, PA-C      . pentafluoroprop-tetrafluoroeth (GEBAUERS) aerosol 1 application  1 application Topical PRN Alric Seton, PA-C       Current Outpatient Prescriptions  Medication Sig Dispense Refill  . acetaminophen (TYLENOL) 325 MG tablet Take 650 mg by mouth every 6 (six) hours as needed for mild pain.    Marland Kitchen  acetaminophen (TYLENOL) 500 MG tablet Take 1,000 mg by mouth every 6 (six) hours as needed (pain).    Marland Kitchen albuterol (PROVENTIL HFA;VENTOLIN HFA) 108 (90 Base) MCG/ACT inhaler Inhale 2 puffs into the lungs every 4 (four) hours as needed for wheezing or shortness of breath.    Marland Kitchen alendronate (FOSAMAX) 70 MG tablet Take 70 mg by mouth once a week. Take with a full glass of water on an empty stomach.    Marland Kitchen amitriptyline (ELAVIL) 10 MG tablet Take 10 mg by mouth at bedtime.     Marland Kitchen aspirin EC 81 MG tablet Take 81 mg by mouth daily.    Marland Kitchen atorvastatin (LIPITOR) 20 MG tablet Take 1 tablet (20 mg total) by mouth daily. 30 tablet 0  . b complex-vitamin c-folic acid (NEPHRO-VITE) 0.8 MG TABS tablet Take 1 tablet by mouth at bedtime.    . cinacalcet (SENSIPAR) 30 MG tablet Take 30 mg by  mouth daily with supper.    . docusate sodium (COLACE) 100 MG capsule Take 100 mg by mouth daily.    . febuxostat (ULORIC) 40 MG tablet Take 40 mg by mouth at bedtime.    . feeding supplement, GLUCERNA SHAKE, (GLUCERNA SHAKE) LIQD Take 237 mLs by mouth 2 (two) times daily between meals. 30 Can 0  . fexofenadine (ALLEGRA) 180 MG tablet Take 180 mg by mouth daily as needed for allergies or rhinitis.    . fish oil-omega-3 fatty acids 1000 MG capsule Take 1 g by mouth daily.     Marland Kitchen geriatric multivitamins-minerals (ELDERTONIC/GEVRABON) ELIX Take 15 mLs by mouth daily.    . Glucosamine-Chondroitin 250-200 MG CAPS Take 1 capsule by mouth daily.      Marland Kitchen guaiFENesin (ROBITUSSIN) 100 MG/5ML liquid Take 200 mg by mouth every 6 (six) hours as needed for cough.    . insulin detemir (LEVEMIR) 100 UNIT/ML injection Inject 0.03 mLs (3 Units total) into the skin daily. 10 mL 11  . insulin regular (NOVOLIN R,HUMULIN R) 100 units/mL injection Inject 0-0.12 mLs (0-12 Units total) into the skin 3 (three) times daily before meals. Per sliding scale: 0-150=0 units, 151-200 = 1 units, 201-250 = 2 units, 251-300 = 3 units, 301-350 = 4 units, 351-400 = 5 units, 401-450 = 6 units, > 450 CALL MD. 10 mL 11  . lansoprazole (PREVACID) 30 MG capsule Take 30 mg by mouth daily at 12 noon.    . magnesium hydroxide (MILK OF MAGNESIA) 400 MG/5ML suspension Take 30 mLs by mouth daily as needed for mild constipation.    Marland Kitchen nystatin (MYCOSTATIN/NYSTOP) 100000 UNIT/GM POWD Apply 1 g topically every 8 (eight) hours as needed (yeastlike areas).     . ondansetron (ZOFRAN) 8 MG tablet Take 8 mg by mouth 3 (three) times daily as needed for nausea or vomiting.    . OxyCODONE HCl, Abuse Deter, 5 MG TABA Take 5 mg by mouth every 4 (four) hours as needed (pain).     . phenol (CHLORASEPTIC) 1.4 % LIQD Use as directed 1 spray in the mouth or throat 3 (three) times daily as needed for throat irritation / pain.    . polyethylene glycol (MIRALAX /  GLYCOLAX) packet Take 17 g by mouth at bedtime.    . polyvinyl alcohol (LIQUIFILM TEARS) 1.4 % ophthalmic solution Place 2 drops into both eyes 4 (four) times daily.    . sevelamer carbonate (RENVELA) 2.4 g PACK Take 2.4 g by mouth 3 (three) times daily with meals.    . vitamin  B-12 (CYANOCOBALAMIN) 1000 MCG tablet Take 1,000 mcg by mouth daily.    Marland Kitchen lanthanum (FOSRENOL) 500 MG chewable tablet Chew 1,000 mg by mouth 3 (three) times daily with meals.     . traMADol (ULTRAM) 50 MG tablet Take 1 tablet (50 mg total) by mouth every 6 (six) hours as needed (pain). (Patient taking differently: Take 50 mg by mouth 4 (four) times daily as needed (pain). ) 30 tablet 0    Allergies: Allergies as of 01/18/2016  . (No Known Allergies)   Past Medical History  Diagnosis Date  . Type 2 diabetes mellitus (HCC)     mellitus? x25 years  . Essential hypertension   . Hypercholesterolemia   . ESRD (end stage renal disease) ([place])   . Anemia of chronic disease   . CAD (coronary artery disease)     Nonobstructive   . GERD (gastroesophageal reflux disease)   . HOCM (hypertrophic obstructive cardiomyopathy) ([place])   . Hypoglycemia 09/2015   Past Surgical History  Procedure Laterality Date  . Total knee arthroplasty      L-2006; R-2008   . Appendectomy    . Transthoracic echocardiogram  05/2011  . Av fistula placement  07/07/2012    Procedure: ARTERIOVENOUS (AV) FISTULA CREATION;  Surgeon: Mal Misty, MD;  Location: [facility] OR;  Service: Vascular;  Laterality: Right;  Brachial - cephalic arterivenous fistula  . Esophagogastroduodenoscopy (egd) with esophageal dilation N/A 10/12/2013    Procedure: ESOPHAGOGASTRODUODENOSCOPY (EGD) WITH ESOPHAGEAL DILATION;  Surgeon: Lafayette Dragon, MD;  Location: [facility] ENDOSCOPY;  Service: Endoscopy;  Laterality: N/A;  . Savory dilation N/A 10/12/2013    Procedure: SAVORY DILATION;  Surgeon: Lafayette Dragon, MD;  Location: [facility] ENDOSCOPY;  Service: Endoscopy;  Laterality: N/A;  . Back  surgery    . Fracture surgery     Family History  Problem Relation Age of Onset  . Heart attack Father   . Stroke Mother    Social History   Social History  . Marital Status: Widowed    Spouse Name: N/A  . Number of Children: N/A  . Years of Education: N/A   Occupational History  . Not on file.   Social History Main Topics  . Smoking status: Never Smoker   . Smokeless tobacco: Never Used     Comment: no smoking   . Alcohol Use: No  . Drug Use: No  . Sexual Activity: Not Currently    Birth Control/ Protection: Post-menopausal   Other Topics Concern  . Not on file   Social History Narrative   Widowed. Retired and lives at assisted living      Review of Systems: Review of Systems  Constitutional: Positive for weight loss and malaise/fatigue. Negative for fever and chills.  HENT: Negative for congestion.   Eyes: Negative for blurred vision and pain.  Respiratory: Negative for cough, shortness of breath and wheezing.   Cardiovascular: Negative for leg swelling.       Chest pain a few days ago.  Gastrointestinal: Positive for nausea and vomiting. Negative for abdominal pain and diarrhea.  Genitourinary: Negative for dysuria.  Musculoskeletal: Negative for myalgias.       Back pain (chronic) Joint pain (chronic)  Skin: Negative for itching and rash.  Neurological: Positive for focal weakness. Negative for dizziness, sensory change, seizures and headaches.    Physical Exam: Blood pressure 119/83, pulse 78, temperature 98.9 F (37.2 C), temperature source Oral, resp. rate 24, height 5\' 4"  ([id number] m), weight 150 lb (68.04  kg), SpO2 91 %. Physical Exam  HENT:  Head: Normocephalic and atraumatic.  Tongue is dry  Dry mucous membranes   Eyes:  Coloboma of right iris  Pupils are reactive to light   Neck: Neck supple. No tracheal deviation present.  Cardiovascular: Normal rate, regular rhythm and intact distal pulses.  Exam reveals no gallop and no friction rub.     Pulmonary/Chest: Effort normal. She has no wheezes.  On 2L O2 via [place] Bibasilar rales   Abdominal: Soft. Bowel sounds are normal. She exhibits no distension. There is no tenderness.  Musculoskeletal: She exhibits no edema.  Neurological: Coordination abnormal.  Alert and following commands  Decreased strength - 3/5 in LUE and 4/5 in LLE.  Normal strength in both right arm and leg. Patient has difficulty following commands with her left arm/ hand: She had a sluggish response with the finger-to-nose test. Also noted to have dysdiadochokinesia on the left side.   Skin: Skin is warm and dry. No rash noted. She is not diaphoretic. No erythema.    Lab results: Basic Metabolic Panel:  Recent Labs  01/18/16 1210  NA 138  K 4.6  CL 96*  CO2 30  GLUCOSE 216*  BUN 38*  CREATININE 5.27*  CALCIUM 8.2*   Liver Function Tests:  Recent Labs  01/18/16 1210  AST 20  ALT 14  ALKPHOS 78  BILITOT 0.4  PROT 5.8*  ALBUMIN 3.0*    Recent Labs  01/18/16 1210  LIPASE 31   No results for input(s): AMMONIA in the last 72 hours. CBC:  Recent Labs  01/18/16 1210  WBC 7.4  NEUTROABS 5.6  HGB 9.9*  HCT 31.7*  MCV 98.4  PLT 221   Cardiac Enzymes: No results for input(s): CKTOTAL, CKMB, CKMBINDEX, TROPONINI in the last 72 hours. BNP: No results for input(s): PROBNP in the last 72 hours. D-Dimer: No results for input(s): DDIMER in the last 72 hours. CBG: No results for input(s): GLUCAP in the last 72 hours. Hemoglobin A1C: No results for input(s): HGBA1C in the last 72 hours. Fasting Lipid Panel: No results for input(s): CHOL, HDL, LDLCALC, TRIG, CHOLHDL, LDLDIRECT in the last 72 hours. Thyroid Function Tests: No results for input(s): TSH, T4TOTAL, FREET4, T3FREE, THYROIDAB in the last 72 hours. Anemia Panel: No results for input(s): VITAMINB12, FOLATE, FERRITIN, TIBC, IRON, RETICCTPCT in the last 72 hours. Coagulation: No results for input(s): LABPROT, INR in the last 72  hours. Urine Drug Screen: Drugs of Abuse  No results found for: LABOPIA, COCAINSCRNUR, LABBENZ, AMPHETMU, THCU, LABBARB  Alcohol Level: No results for input(s): ETH in the last 72 hours. Urinalysis: No results for input(s): COLORURINE, LABSPEC, PHURINE, GLUCOSEU, HGBUR, BILIRUBINUR, KETONESUR, PROTEINUR, UROBILINOGEN, NITRITE, LEUKOCYTESUR in the last 72 hours.  Invalid input(s): APPERANCEUR Misc. Labs:   Imaging results:  Dg Chest 2 View  01/18/2016  CLINICAL DATA:  Shortness of breath.  Low O2 saturation. EXAM: CHEST  2 VIEW COMPARISON:  10/10/2015 and 11/14/2013 and 12/02/2014 FINDINGS: There is cardiomegaly with slight pulmonary vascular congestion. There is diffuse accentuation of the interstitial markings which I suspect represents pulmonary edema. Dense calcification in the mitral valve annulus. No acute bone abnormality. Tiny bilateral effusions. Extensive calcification in the aorta. IMPRESSION: Probable congestive heart failure with slight pulmonary edema and small effusions. Aortic atherosclerosis. Electronically Signed   By: Lorriane Shire M.D.   On: 01/18/2016 12:09   Dg Shoulder Right  01/18/2016  CLINICAL DATA:  Right shoulder pain. EXAM: RIGHT SHOULDER - 2+  VIEW COMPARISON:  None. FINDINGS: Diffuse osteopenia. No fracture or dislocation. No abnormal soft tissue calcification. Minimal degenerative changes at the acromioclavicular joint. Note is made of accentuation of the interstitial markings in the right lung, probably chronic. IMPRESSION: No acute abnormality of the right shoulder. Electronically Signed   By: Lorriane Shire M.D.   On: 01/18/2016 12:05   Ct Head Wo Contrast  01/18/2016  CLINICAL DATA:  Left arm and hand weakness for 3-4 days. EXAM: CT HEAD WITHOUT CONTRAST TECHNIQUE: Contiguous axial images were obtained from the base of the skull through the vertex without intravenous contrast. COMPARISON:  10/07/2013 FINDINGS: There is a subacute nonhemorrhagic infarction in  the right parietal lobe with effacement of the adjacent cortical sulci. New diffuse periventricular white matter lucency. Old lacunar infarcts in the basal ganglia and right side of the pons but new since the study of 2015. Progressive diffuse cerebral cortical atrophy with secondary ventricular dilatation. No acute hemorrhage or mass lesion. Bones are normal except for slight mucosal thickening on the left side of the sphenoid sinus. IMPRESSION: 1. Subacute nonhemorrhagic infarction in the right parietal lobe. 2. Multiple old lacunar infarcts as well as extensive chronic periventricular small vessel disease. 3. Progressive atrophy. Electronically Signed   By: Lorriane Shire M.D.   On: 01/18/2016 15:28   Dg Knee Complete 4 Views Left  01/18/2016  CLINICAL DATA:  Left knee pain. EXAM: LEFT KNEE - COMPLETE 4+ VIEW COMPARISON:  04/30/2005 FINDINGS: The components of the total knee prosthesis appear in good position with no evidence of loosening. There is no fracture or dislocation or joint effusion. Diffuse osteopenia. IMPRESSION: No acute abnormality. Electronically Signed   By: Lorriane Shire M.D.   On: 01/18/2016 12:03    Other results: EKG: PR prolongation, LVH, T wave inversions in several leads  Assessment & Plan by Problem: Active Problems:   Pulmonary edema  Pulmonary edema  Patient denies having any SOB or cough but is requiring 2L supplemental O2. CXR showing CHF with slight pulmonary edema and small effusions. She is ESRD on HD and imaging findings are likely due to fluid overload. She was last dialyzed 2 days ago. Has bibasilar rales on exam. Not likely heart failure exacerbation as her last echo from 11/2014 showed normal EF of 60%. She does not have any lower extremity edema. Troponin 0.4 but records show her troponin is chronically elevated, was the same in 09/2015.  -admit to tele  -going for dialysis now; nephrology on board   ESRD Patient was last dialyzed 2 days ago. She is going for  dialysis now. -nephrology on board  -continue renal supplements   Depression  -amitriptyline   CAD -aspirin 81 mg daily   HOCM -avoid medications that would decrease pre-load   HLD -lipitor   DVT/PE ppx: subcutaneous heparin   Diet: renal   Code: DNR  Dispo: Disposition is deferred at this time, awaiting improvement of current medical problems. Anticipated discharge in approximately 1-2 day(s).   The patient does have a current PCP Myer Peer, MD) and does need an [facility] hospital follow-up appointment after discharge.  The patient does not have transportation limitations that hinder transportation to clinic appointments.  Signed: Shela Leff, MD 01/18/2016, 4:14 PM

## 2016-01-18 NOTE — ED Notes (Signed)
Family at bedside. 

## 2016-01-19 DIAGNOSIS — N186 End stage renal disease: Secondary | ICD-10-CM

## 2016-01-19 DIAGNOSIS — I2510 Atherosclerotic heart disease of native coronary artery without angina pectoris: Secondary | ICD-10-CM

## 2016-01-19 DIAGNOSIS — J90 Pleural effusion, not elsewhere classified: Secondary | ICD-10-CM

## 2016-01-19 DIAGNOSIS — I6359 Cerebral infarction due to unspecified occlusion or stenosis of other cerebral artery: Secondary | ICD-10-CM

## 2016-01-19 DIAGNOSIS — F329 Major depressive disorder, single episode, unspecified: Secondary | ICD-10-CM

## 2016-01-19 DIAGNOSIS — I63411 Cerebral infarction due to embolism of right middle cerebral artery: Principal | ICD-10-CM

## 2016-01-19 DIAGNOSIS — J810 Acute pulmonary edema: Secondary | ICD-10-CM

## 2016-01-19 DIAGNOSIS — Z992 Dependence on renal dialysis: Secondary | ICD-10-CM

## 2016-01-19 DIAGNOSIS — I421 Obstructive hypertrophic cardiomyopathy: Secondary | ICD-10-CM

## 2016-01-19 LAB — MRSA PCR SCREENING: MRSA BY PCR: NEGATIVE

## 2016-01-19 LAB — RENAL FUNCTION PANEL
ALBUMIN: 2.7 g/dL — AB (ref 3.5–5.0)
Anion gap: 10 (ref 5–15)
BUN: 14 mg/dL (ref 6–20)
CO2: 29 mmol/L (ref 22–32)
Calcium: 8.1 mg/dL — ABNORMAL LOW (ref 8.9–10.3)
Chloride: 99 mmol/L — ABNORMAL LOW (ref 101–111)
Creatinine, Ser: 2.88 mg/dL — ABNORMAL HIGH (ref 0.44–1.00)
GFR calc Af Amer: 16 mL/min — ABNORMAL LOW (ref >60)
GFR calc non Af Amer: 14 mL/min — ABNORMAL LOW (ref >60)
GLUCOSE: 145 mg/dL — AB (ref 65–99)
PHOSPHORUS: 4 mg/dL (ref 2.5–4.6)
POTASSIUM: 4 mmol/L (ref 3.5–5.1)
SODIUM: 138 mmol/L (ref 135–145)

## 2016-01-19 LAB — GLUCOSE, CAPILLARY
GLUCOSE-CAPILLARY: 135 mg/dL — AB (ref 65–99)
GLUCOSE-CAPILLARY: 129 mg/dL — AB (ref 65–99)
GLUCOSE-CAPILLARY: 148 mg/dL — AB (ref 65–99)
Glucose-Capillary: 129 mg/dL — ABNORMAL HIGH (ref 65–99)

## 2016-01-19 LAB — TROPONIN I: TROPONIN I: 0.42 ng/mL — AB (ref <0.031)

## 2016-01-19 MED ORDER — BOOST / RESOURCE BREEZE PO LIQD
1.0000 | Freq: Two times a day (BID) | ORAL | Status: DC
  Administered 2016-01-19 – 2016-01-22 (×7): 1 via ORAL

## 2016-01-19 MED ORDER — OXYCODONE HCL 5 MG PO TABS
5.0000 mg | ORAL_TABLET | ORAL | Status: DC | PRN
  Administered 2016-01-19 – 2016-01-22 (×9): 5 mg via ORAL
  Filled 2016-01-19 (×9): qty 1

## 2016-01-19 MED ORDER — ASPIRIN 81 MG PO CHEW: 81.0000 mg | CHEWABLE_TABLET | Freq: Every day | ORAL | Status: DC

## 2016-01-19 NOTE — Plan of Care (Signed)
Problem: Nutrition: Goal: Risk of aspiration will decrease Outcome: Progressing Patient NPO until swallowing evaluation.

## 2016-01-19 NOTE — Progress Notes (Signed)
Initial Nutrition Assessment  DOCUMENTATION CODES:   Not applicable  INTERVENTION:  Provide Boost Breeze po BID, each supplement provides 250 kcal and 9 grams of protein.  Encourage adequate PO intake.   NUTRITION DIAGNOSIS:   Increased nutrient needs related to chronic illness as evidenced by estimated needs.  GOAL:   Patient will meet greater than or equal to 90% of their needs  MONITOR:   PO intake, Supplement acceptance, Weight trends, Labs, I & O's  REASON FOR ASSESSMENT:   Malnutrition Screening Tool    ASSESSMENT:   82 yo F with a PMHx of DM2, HTN, HLD, ESRD on HD, CAD, HOCM, and GERD presenting to the hospital from her assisted living facility. CXR showing CHF with slight pulmonary edema and small effusions. Pt had infarct 4/28.  Diet advanced to a dysphagia 3 diet with thin liquids. Per SLP evaluation, pt with mild oral pharyngeal dysphagia. Pt reports eating well PTA with consumption of at least 3 meals a day with no other difficulties. Per Epic weight records, weight has been stable. No percent meal completion recorded from lunch today yet. Pt was offered Nepro however refused. RD to order Boost Breeze instead to aid in caloric and protein needs.   Nutrition-Focused physical exam completed. Findings are moderate fat depletion, no muscle depletion, and no edema.   Labs and medications reviewed.   Diet Order:  DIET DYS 3 Room service appropriate?: Yes; Fluid consistency:: Thin  Skin:  Reviewed, no issues  Last BM:  4/28  Height:   Ht Readings from Last 1 Encounters:  01/18/16 5\' 4"  (1.626 m)    Weight:   Wt Readings from Last 1 Encounters:  01/18/16 148 lb 9.4 oz (67.4 kg)    Ideal Body Weight:  54.5 kg  BMI:  Body mass index is 25.49 kg/(m^2).  Estimated Nutritional Needs:   Kcal:  1700-1900  Protein:  75-85 grams  Fluid:  Per MD  EDUCATION NEEDS:   No education needs identified at this time  Corrin Parker, MS, RD, LDN Pager #  [phone] After hours/ weekend pager # [phone]

## 2016-01-19 NOTE — Progress Notes (Signed)
Late entry. MD notified of pt 3 beats vtach. Asymptomatic. Will continue to monitor. Bartholomew Crews, RN

## 2016-01-19 NOTE — Progress Notes (Signed)
[facility] Progress Note  Assessment/Plan: 1. Acute mod right MCA infarct 4/28 - I have asked RN to make her NPO until she has a swallowing study;  2. Hypoxia - improved with O2; mild volume on CXR, net UF only 700 - post bed wt 67.3 - sats ok,I think we can defer next HD until Monday; volume removal limited by BP drop during Friday dialysis 3. ESRD - MWF - next HD Monday 4. Hypertension/volume - BP ok, some volume on CXR but sats were low before O2- gets to edw - may need to be lowered; unable to do standing weights - set goal L; she may benefit by an extra tmt for UF tomorrow instead of trying to remove a large volume at once given her advanced age 82. Anemia - hgb slightly lower, no change in ESA yet; Mircera 30 q 4 weeks - last given 4/13 6. Metabolic bone disease - Continue calcitriol- renvela powder and sensipar 30 on outpt med list (she doesn't need fosrenol and renvela powder); Review of her med list shows that she has been on alendronate. Focus of management should be on controlling secondary hyperparathyroidism without oversuppression. Bisphosphonates are contraindicated and should be discontinued. Holding binders for now -npo status 7. Nutrition -made NPO pending Neuro eval/swallowing study 8. DM - per primary 9. DNR 10. + troponin - of unclear significance- consistent with previous admission in January; no acute change on EKG  Myriam Jacobson, PA-C [place] [phone] 01/19/2016,9:29 AM  LOS: 1 day   Subjective:   They said I had a stroke. Staff feeding breakfast Objective Filed Vitals:   01/18/16 1939 01/18/16 2045 01/19/16 0539 01/19/16 0917  BP: 161/60 159/37 125/41 124/36  Pulse: 86 90 81 67  Temp:  98.2 F (36.8 C) 98.5 F (36.9 C) 98.2 F (36.8 C)  TempSrc:  Oral Oral Oral  Resp: 22 20 20 18   Height:      Weight: 67.3 kg (148 lb 5.9 oz) 67.4 kg (148 lb 9.4 oz)    SpO2: 100% 100% 99% 100%   Physical Exam General:  sitting in bed seems to be having difficulty with swallowing.  Talking with food coming out out of mouth. Speech clear. Slight left facial droop Heart: RRR 2/6 murmur Lungs: no rales Abdomen: soft NT Extremities: no LE edema Dialysis Access: right upper AVF  Dialysis Orders: [facility] MWF 3.5 hr 160 400/A 1.5 2 K 2.25 Ca EDW 66.5 right upper AVF heparin 2000 no Fe mircera 30 q 4 weeks - last 4/12 calcitriol 0.5 good compliance with treatments gains < 2.5 Recent labs: Hgb 10.3 31% sat feritin 1516 iPTH 416 Ca/P ok  Additional Objective Labs: Basic Metabolic Panel:  Recent Labs Lab 01/18/16 1210 01/19/16 0506  NA 138 138  K 4.6 4.0  CL 96* 99*  CO2 30 29  GLUCOSE 216* 145*  BUN 38* 14  CREATININE 5.27* 2.88*  CALCIUM 8.2* 8.1*  PHOS  --  4.0   Liver Function Tests:  Recent Labs Lab 01/18/16 1210 01/19/16 0506  AST 20  --   ALT 14  --   ALKPHOS 78  --   BILITOT 0.4  --   PROT 5.8*  --   ALBUMIN 3.0* 2.7*    Recent Labs Lab 01/18/16 1210  LIPASE 31   CBC:  Recent Labs Lab 01/18/16 1210  WBC 7.4  NEUTROABS 5.6  HGB 9.9*  HCT 31.7*  MCV 98.4  PLT 221   Cardiac Enzymes:  Recent Labs Lab 01/18/16 2052 01/19/16 0506  TROPONINI 0.37* 0.42*   CBG:  Recent Labs Lab 01/18/16 2032 01/19/16 0817  GLUCAP 128* 135*    Studies/Results: Dg Chest 2 View  01/18/2016  CLINICAL DATA:  Shortness of breath.  Low O2 saturation. EXAM: CHEST  2 VIEW COMPARISON:  10/10/2015 and 11/14/2013 and 12/02/2014 FINDINGS: There is cardiomegaly with slight pulmonary vascular congestion. There is diffuse accentuation of the interstitial markings which I suspect represents pulmonary edema. Dense calcification in the mitral valve annulus. No acute bone abnormality. Tiny bilateral effusions. Extensive calcification in the aorta. IMPRESSION: Probable congestive heart failure with slight pulmonary edema and small effusions. Aortic atherosclerosis. Electronically Signed   By: Lorriane Shire M.D.   On: 01/18/2016 12:09   Dg Shoulder Right  01/18/2016  CLINICAL DATA:  Right shoulder pain. EXAM: RIGHT SHOULDER - 2+ VIEW COMPARISON:  None. FINDINGS: Diffuse osteopenia. No fracture or dislocation. No abnormal soft tissue calcification. Minimal degenerative changes at the acromioclavicular joint. Note is made of accentuation of the interstitial markings in the right lung, probably chronic. IMPRESSION: No acute abnormality of the right shoulder. Electronically Signed   By: Lorriane Shire M.D.   On: 01/18/2016 12:05   Ct Head Wo Contrast  01/18/2016  CLINICAL DATA:  Left arm and hand weakness for 3-4 days. EXAM: CT HEAD WITHOUT CONTRAST TECHNIQUE: Contiguous axial images were obtained from the base of the skull through the vertex without intravenous contrast. COMPARISON:  10/07/2013 FINDINGS: There is a subacute nonhemorrhagic infarction in the right parietal lobe with effacement of the adjacent cortical sulci. New diffuse periventricular white matter lucency. Old lacunar infarcts in the basal ganglia and right side of the pons but new since the study of 2015. Progressive diffuse cerebral cortical atrophy with secondary ventricular dilatation. No acute hemorrhage or mass lesion. Bones are normal except for slight mucosal thickening on the left side of the sphenoid sinus. IMPRESSION: 1. Subacute nonhemorrhagic infarction in the right parietal lobe. 2. Multiple old lacunar infarcts as well as extensive chronic periventricular small vessel disease. 3. Progressive atrophy. Electronically Signed   By: Lorriane Shire M.D.   On: 01/18/2016 15:28   Mr Brain Wo Contrast  01/19/2016  CLINICAL DATA:  LEFT upper extremity weakness, assess for stroke. History of end-stage renal disease on dialysis, diabetes, hypertension, hypercholesterolemia. EXAM: MRI HEAD WITHOUT CONTRAST TECHNIQUE: Multiplanar, multiecho pulse sequences of the brain and surrounding structures were obtained without intravenous contrast.  COMPARISON:  CT head January 18, 2016 FINDINGS: Multiple sequences are moderately motion degraded. INTRACRANIAL CONTENTS: Confluent reduced diffusion within RIGHT posterior insula/operculum and, frontoparietal lobes measuring up to 4 x 7.3 cm, extending to the RIGHT periventricular white matter. Area of reduced diffusion demonstrates low ADC values in minimal FLAIR T2 hyperintense signal. No susceptibility artifact to suggest hemorrhage. Ventricles and sulci are normal for patient's age. Old small suspected bilateral basal ganglia lacunar infarcts with prominent thalamus perivascular spaces. Patchy to confluent supratentorial and pontine white matter FLAIR T2 hyperintensities. Old bilateral small cerebellar infarcts. No midline shift, mass effect or mass is though, motion degrades sensitivity. Normal major intracranial vascular flow voids present at skullbase. ORBITS: The included ocular globes and orbital contents are non-suspicious. Status post bilateral ocular lens implants. SINUSES: Less sphenoid sinus air-fluid level. Mastoid air cells are well aerated. SKULL/SOFT TISSUES: No abnormal sellar expansion. No suspicious calvarial bone marrow signal. Craniocervical junction maintained. IMPRESSION: Motion degraded examination. Acute moderate RIGHT middle cerebral artery territory infarct correlating to today's CT  abnormality. No hemorrhagic conversion. Moderate to severe chronic small vessel ischemic disease and old suspected bilateral basal ganglia lacunar infarcts. Electronically Signed   By: Elon Alas M.D.   On: 01/19/2016 00:12   Dg Knee Complete 4 Views Left  01/18/2016  CLINICAL DATA:  Left knee pain. EXAM: LEFT KNEE - COMPLETE 4+ VIEW COMPARISON:  04/30/2005 FINDINGS: The components of the total knee prosthesis appear in good position with no evidence of loosening. There is no fracture or dislocation or joint effusion. Diffuse osteopenia. IMPRESSION: No acute abnormality. Electronically Signed   By:  Lorriane Shire M.D.   On: 01/18/2016 12:03   Medications:   . amitriptyline  10 mg Oral QHS  . aspirin EC  81 mg Oral Daily  . atorvastatin  40 mg Oral Daily  . calcitRIOL  0.5 mcg Oral Q M,W,F-HD  . cinacalcet  30 mg Oral Q supper  . febuxostat  40 mg Oral QHS  . heparin  5,000 Units Subcutaneous Q8H  . insulin aspart  0-5 Units Subcutaneous QHS  . insulin aspart  0-9 Units Subcutaneous TID WC  . sevelamer carbonate  2.4 g Oral TID WC  . sodium chloride flush  3 mL Intravenous Q12H

## 2016-01-19 NOTE — Evaluation (Signed)
Clinical/Bedside Swallow Evaluation Patient Details  Name: [name] [name] MRN: [id number] Date of Birth: [birth year]  Today's Date: 01/19/2016 Time: SLP Start Time (ACUTE ONLY): 1125 SLP Stop Time (ACUTE ONLY): 1147 SLP Time Calculation (min) (ACUTE ONLY): 22 min  Past Medical History:  Past Medical History  Diagnosis Date  . Type 2 diabetes mellitus (HCC)     mellitus? x25 years  . Essential hypertension   . Hypercholesterolemia   . ESRD (end stage renal disease) ([place])   . Anemia of chronic disease   . CAD (coronary artery disease)     Nonobstructive   . GERD (gastroesophageal reflux disease)   . HOCM (hypertrophic obstructive cardiomyopathy) ([place])   . Hypoglycemia 09/2015   Past Surgical History:  Past Surgical History  Procedure Laterality Date  . Total knee arthroplasty      L-2006; R-2008   . Appendectomy    . Transthoracic echocardiogram  05/2011  . Av fistula placement  07/07/2012    Procedure: ARTERIOVENOUS (AV) FISTULA CREATION;  Surgeon: Mal Misty, MD;  Location: [facility] OR;  Service: Vascular;  Laterality: Right;  Brachial - cephalic arterivenous fistula  . Esophagogastroduodenoscopy (egd) with esophageal dilation N/A 10/12/2013    Procedure: ESOPHAGOGASTRODUODENOSCOPY (EGD) WITH ESOPHAGEAL DILATION;  Surgeon: Lafayette Dragon, MD;  Location: [facility] ENDOSCOPY;  Service: Endoscopy;  Laterality: N/A;  . Savory dilation N/A 10/12/2013    Procedure: SAVORY DILATION;  Surgeon: Lafayette Dragon, MD;  Location: [facility] ENDOSCOPY;  Service: Endoscopy;  Laterality: N/A;  . Back surgery    . Fracture surgery     HPI:  : Patient is a 82 yo F with a PMHx of DM2, HTN, HLD, ESRD on HD, CAD, HOCM, and GERD presenting to the hospital from her assisted living facility. Patient states she came into the hospital because "my butt felt heavy" early this morning. Remainder of history was provided by daughter at bedside. Daughter states patient is having pain in her R shoulder, L knee, and her L hand is  "limp." States patient has has surgeries done on both her shoulders and both her knees in the past. States she was told patient could not use her commode at the nursing home this morning because she felt weak; she could not use her arms to hold on to the rails before sitting on the commode. Normally she uses a wheelchair to ambulate. States she visited the patient yesterday and she was in her usual state of health at that time. Patient has also been complaining of nausea since this morning and coughed up some phlegm. She has been having decreased appetite since she moved into the ALF several years ago and daughter believes she lost 60 lbs in the past 2 years. She is not aware of the patient having any fevers, chills, or cough in the past few days. Although, she has been complaining of "twitching" back pain on occasion.    Assessment / Plan / Recommendation Clinical Impression  Pt has mild oral pharyngeal dysphagia following a CVA. Pt had no s/s of aspiration with all consistencies tested. Pt did however have reduced coordination of tongue during mastication with reduced laryngeal elevation. Pt was very tired during evaluation and this is thought to be a factor. Pt recommended for a Dys 3, thin liquid diet with full assistance to aid in feeding and to monitor arousal level. Alternate liquids and solids during meals. Speech therapy will follow up for diet tolerance and education.     Aspiration Risk  Mild  aspiration risk    Diet Recommendation Dysphagia 3 (Mech soft);Thin liquid   Liquid Administration via: Cup Medication Administration: Whole meds with liquid Compensations: Slow rate;Small sips/bites;Follow solids with liquid (No straw) Postural Changes: Seated upright at 90 degrees;Remain upright for at least 30 minutes after po intake    Other  Recommendations Oral Care Recommendations: Oral care BID   Follow up Recommendations  Skilled Nursing facility    Frequency and Duration min 2x/week  1  week       Prognosis Prognosis for Safe Diet Advancement: Good      Swallow Study   General Date of Onset: 01/18/16 HPI: : Patient is a 82 yo F with a PMHx of DM2, HTN, HLD, ESRD on HD, CAD, HOCM, and GERD presenting to the hospital from her assisted living facility. Patient states she came into the hospital because "my butt felt heavy" early this morning. Remainder of history was provided by daughter at bedside. Daughter states patient is having pain in her R shoulder, L knee, and her L hand is "limp." States patient has has surgeries done on both her shoulders and both her knees in the past. States she was told patient could not use her commode at the nursing home this morning because she felt weak; she could not use her arms to hold on to the rails before sitting on the commode. Normally she uses a wheelchair to ambulate. States she visited the patient yesterday and she was in her usual state of health at that time. Patient has also been complaining of nausea since this morning and coughed up some phlegm. She has been having decreased appetite since she moved into the ALF several years ago and daughter believes she lost 60 lbs in the past 2 years. She is not aware of the patient having any fevers, chills, or cough in the past few days. Although, she has been complaining of "twitching" back pain on occasion.  Type of Study: Bedside Swallow Evaluation Diet Prior to this Study: NPO Temperature Spikes Noted: No Respiratory Status: Room air History of Recent Intubation: No Behavior/Cognition: Lethargic/Drowsy Oral Cavity Assessment: Within Functional Limits Oral Care Completed by SLP: Yes Oral Cavity - Dentition: Adequate natural dentition Vision: Functional for self-feeding Self-Feeding Abilities: Needs assist Patient Positioning: Upright in bed Baseline Vocal Quality: Normal Volitional Cough: Strong Volitional Swallow: Able to elicit    Oral/Motor/Sensory Function Overall Oral  Motor/Sensory Function: Within functional limits   Ice Chips Ice chips: Within functional limits Presentation: Spoon   Thin Liquid Thin Liquid: Within functional limits Presentation: Cup;Straw    Nectar Thick     Honey Thick     Puree Puree: Within functional limits Presentation: Spoon   Solid   GO   Solid: Impaired Presentation: Self Fed Oral Phase Impairments: Impaired mastication;Reduced lingual movement/coordination Oral Phase Functional Implications: Prolonged oral transit Pharyngeal Phase Impairments: Decreased hyoid-laryngeal movement    Functional Assessment Tool Used: Bedside Swallow evaluation Functional Limitations: Swallowing Swallow Current Status [id number]): At least 20 percent but less than 40 percent impaired, limited or restricted Swallow Goal Status [phone]): At least 1 percent but less than 20 percent impaired, limited or restricted  Charlynne Cousins Jennavie Martinek, MA, CCC-SLP 01/19/2016 11:51 AM

## 2016-01-19 NOTE — Consult Note (Signed)
Neurology Consultation Reason for Consult: Stroke Referring Physician: Orie Fisherman  CC: lef tsided weakness  History is obtained from:patient  HPI: [name] [name] is a 82 y.o. female with a history of DM, ESRD who presented with pulmonary edema and left hand weakness. It is unclear when it started. It i snoted in the ED that her hand fel tlimp and a CT showed subacute infarct. The MRI confirmed an acute posterior div MCA infarct. She continues to have left sided weakness   LKW: unclear tpa given?: no, unclear time of onset.     ROS: A 14 point ROS was performed and is negative except as noted in the HPI.    Past Medical History  Diagnosis Date  . Type 2 diabetes mellitus (HCC)     mellitus? x25 years  . Essential hypertension   . Hypercholesterolemia   . ESRD (end stage renal disease) ([place])   . Anemia of chronic disease   . CAD (coronary artery disease)     Nonobstructive   . GERD (gastroesophageal reflux disease)   . HOCM (hypertrophic obstructive cardiomyopathy) ([place])   . Hypoglycemia 09/2015     Family History  Problem Relation Age of Onset  . Heart attack Father   . Stroke Mother      Social History:  reports that she has never smoked. She has never used smokeless tobacco. She reports that she does not drink alcohol or use illicit drugs.   Exam: Current vital signs: BP 127/35 mmHg  Pulse 78  Temp(Src) 98.9 F (37.2 C) (Oral)  Resp 20  Ht 5\' 4"  (1.626 m)  Wt 67.4 kg (148 lb 9.4 oz)  BMI 25.49 kg/m2  SpO2 97%  LMP  (LMP Unknown) Vital signs in last 24 hours: Temp:  [97.9 F (36.6 C)-98.9 F (37.2 C)] 98.9 F (37.2 C) (04/29 1414) Pulse Rate:  [67-90] 78 (04/29 1414) Resp:  [18-26] 20 (04/29 1414) BP: (69-161)/(32-67) 127/35 mmHg (04/29 1414) SpO2:  [97 %-100 %] 97 % (04/29 1414) Weight:  [67.3 kg (148 lb 5.9 oz)-68 kg (149 lb 14.6 oz)] 67.4 kg (148 lb 9.4 oz) (04/28 2045)   Physical Exam  Constitutional: Appears well-developed and  well-nourished.  Psych: Affect appropriate to situation Eyes: No scleral injection HENT: No OP obstrucion Head: Normocephalic.  Cardiovascular: Normal rate  Respiratory: Effort normal and breath sounds normal to anterior ascultation GI: Soft.  No distension. There is no tenderness.  Skin: WDI  Neuro: Mental Status: Patient is awake, alert, oriented to person, place, month, year, and situation. Patient is able to give a clear and coherent history. No signs of aphasia. She does have neglect.  Cranial Nerves: II: Left hemianopia.  Pupils are equal, round, and reactive to light.   III,IV, VI: EOMI without ptosis or diploplia.  V: Facial sensation is decreased on left VII: Facial movement is mildly weak on left.  VIII: hearing is intact to voice X: Uvula elevates symmetrically XI: Shoulder shrug is symmetric. XII: tongue is midline without atrophy or fasciculations.  Motor: Tone is normal. Bulk is normal. 5/5 strength on right, on left, she has 4/5 left arm, 3/5 left leg. Left leg also limited by pain.  Sensory: Sensation is decreased throughout the left side.  Cerebellar: No clear ataxia        I have reviewed labs in epic and the results pertinent to this consultation are: LDL 79  I have reviewed the images obtained: MRI brain - R Posterior MCA infarct  Impression: 82  yo F with MCA infarct. She has multiple stroke risk factors, but will need workup for likely embolic infarct  Recommendations: 1) MRA head, carotid dopplers 2) asa 325mg  daily 3) PT,OT 4) statin for LDL > 70 5) echo cardiogram 6) stroke team to follow.    Roland Rack, MD [facility] [phone]  If 7pm- 7am, please page neurology on call as listed in [place].

## 2016-01-19 NOTE — Progress Notes (Signed)
  Date: 01/19/2016  Patient name: [name] [name]  Medical record number: [id number]  Date of birth: November 24, 1925   I have seen and evaluated [name] and discussed their care with the Residency Team. In brief, patient is a 82 y/o female with PMH of DM, HTN, ESRD on HD, HOCM who p/w R shoulder and left knee pain with weakness of her left UE. She was unable to use the commode in the nursing home secondary to weakness. No fevers/chills no CP, no SOB, no abd pain. Patient did have one episode of nausea prior to admission. No vomiting. She also had productive cough * 1 day. No orthopnea/PND. Today she feels well but has left LE weakness.  PMHx, Fam Hx, and/or Soc Hx : as per resident admit note  Filed Vitals:   01/19/16 0539 01/19/16 0917  BP: 125/41 124/36  Pulse: 81 67  Temp: 98.5 F (36.9 C) 98.2 F (36.8 C)  Resp: 20 18   Gen: AAO*3, NAD CVS: RRR, normal heart sounds Lungs: bibasilar crackles + Abd: soft, non tender, BS + Ext: no edema  Neuro: decreased strength L UE 4/5, L LE 3/5, sensation intact, has post pointing on finger- nose test with left UE  Assessment and Plan: I have seen and evaluated the patient as outlined above. I agree with the formulated Assessment and Plan as detailed in the residents' note, with the following changes:   1. Acute R parietal CVA: - MRI brain and CT head noted. Patient with R MCA infarct - Await neuro recommendatioons - c/w asa, lipitor for nwo - will await PT/OT eval - Neuro consult called - f/u 2 D ECHO - Will likely need SNF on d/c - SLP eval appreciated- will start dysphagia 3 diet  2. ESRD on HD: - c/w HD per nephrology - Nephrology recommendations appreciated  3. Pulm edema: - likely secondary to ESRD. Patient is now s/p HD - Patient denies SOB/CP - c.w HD per nephrology  4. CAD: - noted to have mildly elevated troponins up to 0.42 - This is consistent with prior admissions where she is noted to have mildly elevated  troponins - Patient denies CP currently, no acute EKG changes (T wave inversions likely secondary to repolarization changes and similar to prior EKG)  Aldine Contes, MD 4/29/20171:04 PM

## 2016-01-19 NOTE — Progress Notes (Addendum)
Subjective: Patient denies having any dyspnea or cough. She is AAO x3 and does not have any complaints. Breathing comfortably on room air.    Objective: Vital signs in last 24 hours: Filed Vitals:   01/18/16 1939 01/18/16 2045 01/19/16 0539 01/19/16 0917  BP: 161/60 159/37 125/41 124/36  Pulse: 86 90 81 67  Temp:  98.2 F (36.8 C) 98.5 F (36.9 C) 98.2 F (36.8 C)  TempSrc:  Oral Oral Oral  Resp: 22 20 20 18   Height:      Weight: 148 lb 5.9 oz (67.3 kg) 148 lb 9.4 oz (67.4 kg)    SpO2: 100% 100% 99% 100%   Weight change:   Intake/Output Summary (Last 24 hours) at 01/19/16 1028 Last data filed at 01/19/16 0648  Gross per 24 hour  Intake      0 ml  Output    700 ml  Net   -700 ml   Physical Exam: Eyes: EOMI.  Cardiovascular: Normal rate, regular rhythm and intact distal pulses.  Pulmonary/Chest: Effort normal. She has no wheezes. Breathing comfortably on room air.  Bibasilar rales  Abdominal: Soft. Bowel sounds are normal. She exhibits no distension. There is no tenderness.  Musculoskeletal: She exhibits no edema.  Neurological: Coordination abnormal.  AAO x3 and following commands  Decreased strength - 4/5 in LUE and 4/5 in LLE.  Normal strength in both right arm and right leg. Patient has difficulty following commands with her left arm/ hand: She had a sluggish response with the finger-to-nose test. Also noted to have dysdiadochokinesia on the left side.  Skin: Skin is warm and dry. No rash noted. She is not diaphoretic. No erythema.   Lab Results: Basic Metabolic Panel:  Recent Labs Lab 01/18/16 1210 01/19/16 0506  NA 138 138  K 4.6 4.0  CL 96* 99*  CO2 30 29  GLUCOSE 216* 145*  BUN 38* 14  CREATININE 5.27* 2.88*  CALCIUM 8.2* 8.1*  PHOS  --  4.0   Liver Function Tests:  Recent Labs Lab 01/18/16 1210 01/19/16 0506  AST 20  --   ALT 14  --   ALKPHOS 78  --   BILITOT 0.4  --   PROT 5.8*  --   ALBUMIN 3.0* 2.7*    Recent Labs Lab  01/18/16 1210  LIPASE 31   No results for input(s): AMMONIA in the last 168 hours. CBC:  Recent Labs Lab 01/18/16 1210  WBC 7.4  NEUTROABS 5.6  HGB 9.9*  HCT 31.7*  MCV 98.4  PLT 221   Cardiac Enzymes:  Recent Labs Lab 01/18/16 2052 01/19/16 0506  TROPONINI 0.37* 0.42*   BNP: No results for input(s): PROBNP in the last 168 hours. D-Dimer: No results for input(s): DDIMER in the last 168 hours. CBG:  Recent Labs Lab 01/18/16 2032 01/19/16 0817  GLUCAP 128* 135*   Hemoglobin A1C: No results for input(s): HGBA1C in the last 168 hours. Fasting Lipid Panel:  Recent Labs Lab 01/18/16 2052  CHOL 163  HDL 64  LDLCALC 79  TRIG 100  CHOLHDL 2.5   Thyroid Function Tests: No results for input(s): TSH, T4TOTAL, FREET4, T3FREE, THYROIDAB in the last 168 hours. Coagulation: No results for input(s): LABPROT, INR in the last 168 hours. Anemia Panel: No results for input(s): VITAMINB12, FOLATE, FERRITIN, TIBC, IRON, RETICCTPCT in the last 168 hours. Urine Drug Screen: Drugs of Abuse  No results found for: LABOPIA, COCAINSCRNUR, LABBENZ, AMPHETMU, THCU, LABBARB  Alcohol Level: No results for input(s):  ETH in the last 168 hours. Urinalysis: No results for input(s): COLORURINE, LABSPEC, PHURINE, GLUCOSEU, HGBUR, BILIRUBINUR, KETONESUR, PROTEINUR, UROBILINOGEN, NITRITE, LEUKOCYTESUR in the last 168 hours.  Invalid input(s): APPERANCEUR Misc. Labs:   Micro Results: Recent Results (from the past 240 hour(s))  MRSA PCR Screening     Status: None   Collection Time: 01/19/16  6:14 AM  Result Value Ref Range Status   MRSA by PCR NEGATIVE NEGATIVE Final    Comment:        The GeneXpert MRSA Assay (FDA approved for NASAL specimens only), is one component of a comprehensive MRSA colonization surveillance program. It is not intended to diagnose MRSA infection nor to guide or monitor treatment for MRSA infections.    Studies/Results: Dg Chest 2 View  01/18/2016   CLINICAL DATA:  Shortness of breath.  Low O2 saturation. EXAM: CHEST  2 VIEW COMPARISON:  10/10/2015 and 11/14/2013 and 12/02/2014 FINDINGS: There is cardiomegaly with slight pulmonary vascular congestion. There is diffuse accentuation of the interstitial markings which I suspect represents pulmonary edema. Dense calcification in the mitral valve annulus. No acute bone abnormality. Tiny bilateral effusions. Extensive calcification in the aorta. IMPRESSION: Probable congestive heart failure with slight pulmonary edema and small effusions. Aortic atherosclerosis. Electronically Signed   By: Lorriane Shire M.D.   On: 01/18/2016 12:09   Dg Shoulder Right  01/18/2016  CLINICAL DATA:  Right shoulder pain. EXAM: RIGHT SHOULDER - 2+ VIEW COMPARISON:  None. FINDINGS: Diffuse osteopenia. No fracture or dislocation. No abnormal soft tissue calcification. Minimal degenerative changes at the acromioclavicular joint. Note is made of accentuation of the interstitial markings in the right lung, probably chronic. IMPRESSION: No acute abnormality of the right shoulder. Electronically Signed   By: Lorriane Shire M.D.   On: 01/18/2016 12:05   Ct Head Wo Contrast  01/18/2016  CLINICAL DATA:  Left arm and hand weakness for 3-4 days. EXAM: CT HEAD WITHOUT CONTRAST TECHNIQUE: Contiguous axial images were obtained from the base of the skull through the vertex without intravenous contrast. COMPARISON:  10/07/2013 FINDINGS: There is a subacute nonhemorrhagic infarction in the right parietal lobe with effacement of the adjacent cortical sulci. New diffuse periventricular white matter lucency. Old lacunar infarcts in the basal ganglia and right side of the pons but new since the study of 2015. Progressive diffuse cerebral cortical atrophy with secondary ventricular dilatation. No acute hemorrhage or mass lesion. Bones are normal except for slight mucosal thickening on the left side of the sphenoid sinus. IMPRESSION: 1. Subacute  nonhemorrhagic infarction in the right parietal lobe. 2. Multiple old lacunar infarcts as well as extensive chronic periventricular small vessel disease. 3. Progressive atrophy. Electronically Signed   By: Lorriane Shire M.D.   On: 01/18/2016 15:28   Mr Brain Wo Contrast  01/19/2016  CLINICAL DATA:  LEFT upper extremity weakness, assess for stroke. History of end-stage renal disease on dialysis, diabetes, hypertension, hypercholesterolemia. EXAM: MRI HEAD WITHOUT CONTRAST TECHNIQUE: Multiplanar, multiecho pulse sequences of the brain and surrounding structures were obtained without intravenous contrast. COMPARISON:  CT head January 18, 2016 FINDINGS: Multiple sequences are moderately motion degraded. INTRACRANIAL CONTENTS: Confluent reduced diffusion within RIGHT posterior insula/operculum and, frontoparietal lobes measuring up to 4 x 7.3 cm, extending to the RIGHT periventricular white matter. Area of reduced diffusion demonstrates low ADC values in minimal FLAIR T2 hyperintense signal. No susceptibility artifact to suggest hemorrhage. Ventricles and sulci are normal for patient's age. Old small suspected bilateral basal ganglia lacunar infarcts with prominent thalamus  perivascular spaces. Patchy to confluent supratentorial and pontine white matter FLAIR T2 hyperintensities. Old bilateral small cerebellar infarcts. No midline shift, mass effect or mass is though, motion degrades sensitivity. Normal major intracranial vascular flow voids present at skullbase. ORBITS: The included ocular globes and orbital contents are non-suspicious. Status post bilateral ocular lens implants. SINUSES: Less sphenoid sinus air-fluid level. Mastoid air cells are well aerated. SKULL/SOFT TISSUES: No abnormal sellar expansion. No suspicious calvarial bone marrow signal. Craniocervical junction maintained. IMPRESSION: Motion degraded examination. Acute moderate RIGHT middle cerebral artery territory infarct correlating to today's CT  abnormality. No hemorrhagic conversion. Moderate to severe chronic small vessel ischemic disease and old suspected bilateral basal ganglia lacunar infarcts. Electronically Signed   By: Elon Alas M.D.   On: 01/19/2016 00:12   Dg Knee Complete 4 Views Left  01/18/2016  CLINICAL DATA:  Left knee pain. EXAM: LEFT KNEE - COMPLETE 4+ VIEW COMPARISON:  04/30/2005 FINDINGS: The components of the total knee prosthesis appear in good position with no evidence of loosening. There is no fracture or dislocation or joint effusion. Diffuse osteopenia. IMPRESSION: No acute abnormality. Electronically Signed   By: Lorriane Shire M.D.   On: 01/18/2016 12:03   Medications: I have reviewed the patient's current medications. Scheduled Meds: . amitriptyline  10 mg Oral QHS  . aspirin EC  81 mg Oral Daily  . atorvastatin  40 mg Oral Daily  . calcitRIOL  0.5 mcg Oral Q M,W,F-HD  . cinacalcet  30 mg Oral Q supper  . febuxostat  40 mg Oral QHS  . heparin  5,000 Units Subcutaneous Q8H  . insulin aspart  0-5 Units Subcutaneous QHS  . insulin aspart  0-9 Units Subcutaneous TID WC  . sevelamer carbonate  2.4 g Oral TID WC  . sodium chloride flush  3 mL Intravenous Q12H   Continuous Infusions:  PRN Meds:.sodium chloride, sodium chloride, acetaminophen, alteplase, heparin, lidocaine (PF), lidocaine-prilocaine, pentafluoroprop-tetrafluoroeth Assessment/Plan: Active Problems:   Pulmonary edema   Volume overload  Pulmonary edema and small pleural effusions  Likely 2/2 fluid overload in the setting of ESRD. Patient denies having any SOB or cough. Breathing comfortably on room air. She received HD on admission yesterday.   -monitor on telemetry   Acute R MCA territory infarct  Patient noted to have hemi-neglect on the left since admission. Difficulty with coordination when using her LUE and decreased strength in both LUE and LLE. CT of head showing subacute non-hemorrhagic infarction in the R parietal lobe. MRI  of brain showing an acute moderate R middle cerebral artery territory infarct. Lipid panel showing cholesterol 163, HDL 64, and LDL 79.  -Neurology has been consulted; appreciate recs  -She received Aspirin 324 mg on admission. Continue Aspirin 81 mg daily.  -Lipitor 40 mg daily  -PT/OT -A1c pending  -Echo pending   ESRD Patient received HD on admission yesterday.  -nephrology on board; appreciate recs  -continue renal supplements   Depression  -amitriptyline   CAD -aspirin 81 mg daily   HOCM -avoid medications that would decrease pre-load   DVT/PE ppx: subcutaneous heparin   Diet: renal   Code: DNR  Dispo: Disposition is deferred at this time, awaiting improvement of current medical problems.  Anticipated discharge in approximately 1-2 day(s).   The patient does have a current PCP Myer Peer, MD) and does need an [facility] hospital follow-up appointment after discharge.  The patient does not have transportation limitations that hinder transportation to clinic appointments.  .Services Needed at  time of discharge: Y = Yes, Blank = No PT:   OT:   RN:   Equipment:   Other:     LOS: 1 day   Shela Leff, MD 01/19/2016, 10:28 AM

## 2016-01-20 ENCOUNTER — Inpatient Hospital Stay (HOSPITAL_COMMUNITY): Payer: Medicare Other

## 2016-01-20 DIAGNOSIS — I63131 Cerebral infarction due to embolism of right carotid artery: Secondary | ICD-10-CM

## 2016-01-20 LAB — GLUCOSE, CAPILLARY
GLUCOSE-CAPILLARY: 138 mg/dL — AB (ref 65–99)
GLUCOSE-CAPILLARY: 164 mg/dL — AB (ref 65–99)
GLUCOSE-CAPILLARY: 206 mg/dL — AB (ref 65–99)
Glucose-Capillary: 149 mg/dL — ABNORMAL HIGH (ref 65–99)

## 2016-01-20 LAB — RENAL FUNCTION PANEL
ANION GAP: 13 (ref 5–15)
Albumin: 2.6 g/dL — ABNORMAL LOW (ref 3.5–5.0)
BUN: 30 mg/dL — ABNORMAL HIGH (ref 6–20)
CALCIUM: 8.1 mg/dL — AB (ref 8.9–10.3)
CO2: 27 mmol/L (ref 22–32)
Chloride: 96 mmol/L — ABNORMAL LOW (ref 101–111)
Creatinine, Ser: 4.91 mg/dL — ABNORMAL HIGH (ref 0.44–1.00)
GFR, EST AFRICAN AMERICAN: 8 mL/min — AB (ref >60)
GFR, EST NON AFRICAN AMERICAN: 7 mL/min — AB (ref >60)
Glucose, Bld: 156 mg/dL — ABNORMAL HIGH (ref 65–99)
Phosphorus: 5.2 mg/dL — ABNORMAL HIGH (ref 2.5–4.6)
Potassium: 4.2 mmol/L (ref 3.5–5.1)
SODIUM: 136 mmol/L (ref 135–145)

## 2016-01-20 MED ORDER — CLOPIDOGREL BISULFATE 75 MG PO TABS
75.0000 mg | ORAL_TABLET | Freq: Every day | ORAL | Status: DC
  Administered 2016-01-21 – 2016-01-22 (×2): 75 mg via ORAL
  Filled 2016-01-20 (×2): qty 1

## 2016-01-20 MED ORDER — STROKE: EARLY STAGES OF RECOVERY BOOK
Freq: Once | Status: AC
  Administered 2016-01-20: 09:00:00
  Filled 2016-01-20: qty 1

## 2016-01-20 MED ORDER — ASPIRIN 325 MG PO TABS
325.0000 mg | ORAL_TABLET | Freq: Every day | ORAL | Status: DC
  Administered 2016-01-20: 325 mg via ORAL
  Filled 2016-01-20: qty 1

## 2016-01-20 NOTE — Progress Notes (Signed)
Patient ID: [name] [name], female   DOB: Aug 20, 1926, 82 y.o.   MRN: [id number]  [facility] Progress Note   Assessment/ Plan:   1. Acute mod right MCA infarct 4/28 - she is going to undergo MRA this morning andcarotid Dopplers at some point for ongoing evaluation of new CVA. Will need reevaluation by physical therapy/occupational therapy for needs. 2. ESRD - MWF - next HD Monday-cautious ultrafiltration 3. Hypertension/volume - blood pressures acceptable at this point-initial concerns raised with findings of volume excess on chest x-ray with concomitant hypoxia. Attempted ultrafiltration however limited by intradialytic hypotension. 4. Anemia - hgb slightly lower, no change in ESA yet; Mircera 30 q 4 weeks - last given 4/13 5. Metabolic bone disease - Continue calcitriol- renvela powder and sensipar 30 on outpt med list. I agree with discontinuation of her bisphosphonates given lack of adequate safety data in patients with ESRD in the low benefit to risk ratio. 6. Nutrition -evaluated by speech therapy and now on a dysphagia 3 diet with thin fluid consistency. 7. DM - per primary 8. DNR  Subjective:   Reports that she is still having difficulty with using her left side and feels weaker than she did prior to admission.    Objective:   BP 117/36 mmHg  Pulse 69  Temp(Src) 98.4 F (36.9 C) (Oral)  Resp 16  Ht 5\' 4"  (1.626 m)  Wt 65.318 kg (144 lb)  BMI 24.71 kg/m2  SpO2 98%  LMP  (LMP Unknown)  Physical Exam: Gen: Appears to be comfortable resting in bed, oriented to time, place and person CVS: Pulse in regular rhythm, normal rate Resp: Coarse breath sounds bilaterally-no distinct rales Abd: Soft, flat, nontender Ext: No lower extremity edema. Motor strength appears to be decreased in left upper and lower extremities compared to right.  Labs: BMET  Recent Labs Lab 01/18/16 1210 01/19/16 0506 01/20/16 0641  NA 138 138 136  K 4.6 4.0 4.2  CL 96* 99* 96*   CO2 30 29 27   GLUCOSE 216* 145* 156*  BUN 38* 14 30*  CREATININE 5.27* 2.88* 4.91*  CALCIUM 8.2* 8.1* 8.1*  PHOS  --  4.0 5.2*   CBC  Recent Labs Lab 01/18/16 1210  WBC 7.4  NEUTROABS 5.6  HGB 9.9*  HCT 31.7*  MCV 98.4  PLT 221   Medications:    . amitriptyline  10 mg Oral QHS  . aspirin  81 mg Oral Daily  . atorvastatin  40 mg Oral Daily  . calcitRIOL  0.5 mcg Oral Q M,W,F-HD  . cinacalcet  30 mg Oral Q supper  . febuxostat  40 mg Oral QHS  . feeding supplement  1 Container Oral BID BM  . heparin  5,000 Units Subcutaneous Q8H  . insulin aspart  0-5 Units Subcutaneous QHS  . insulin aspart  0-9 Units Subcutaneous TID WC  . sevelamer carbonate  2.4 g Oral TID WC  . sodium chloride flush  3 mL Intravenous Q12H   Elmarie Shiley, MD 01/20/2016, 10:35 AM

## 2016-01-20 NOTE — Plan of Care (Signed)
Problem: Tissue Perfusion: Goal: Cerebral tissue perfusion will improve applicable to all stroke diagnoses  Outcome: Progressing MD ordered MRA brain. Will continue to monitor.

## 2016-01-20 NOTE — Progress Notes (Signed)
Subjective: Doing well this morning. Tolerating PO intake. Denies CP or SOB.  Objective: Vital signs in last 24 hours: Filed Vitals:   01/19/16 1733 01/19/16 2001 01/20/16 0435 01/20/16 0752  BP: 111/35 117/28 130/46 117/36  Pulse: 71 66 84 69  Temp: 98.6 F (37 C) 98.5 F (36.9 C) 98.4 F (36.9 C) 98.4 F (36.9 C)  TempSrc: Oral   Oral  Resp: 18 20 17 16   Height:      Weight:  144 lb (65.318 kg)    SpO2: 99% 95% 98% 98%   Weight change: -6 lb (-2.722 kg)  Intake/Output Summary (Last 24 hours) at 01/20/16 0759 Last data filed at 01/19/16 1854  Gross per 24 hour  Intake    120 ml  Output      0 ml  Net    120 ml   General Apperance: NAD HEENT: Normocephalic, atraumatic, anicteric sclera Neck: Supple, trachea midline Lungs: Clear to auscultation bilaterally. No wheezes, rhonchi or rales. Breathing comfortably Heart: Regular rate and rhythm, no murmur/rub/gallop Abdomen: Soft, nontender, nondistended, no rebound/guarding Extremities: Warm and well perfused, no edema Skin: No rashes or lesions Neurologic: Alert and interactive. 4/5 LUE and LLE.  Lab Results: Basic Metabolic Panel:  Recent Labs Lab 01/19/16 0506 01/20/16 0641  NA 138 136  K 4.0 4.2  CL 99* 96*  CO2 29 27  GLUCOSE 145* 156*  BUN 14 30*  CREATININE 2.88* 4.91*  CALCIUM 8.1* 8.1*  PHOS 4.0 5.2*   Liver Function Tests:  Recent Labs Lab 01/18/16 1210 01/19/16 0506 01/20/16 0641  AST 20  --   --   ALT 14  --   --   ALKPHOS 78  --   --   BILITOT 0.4  --   --   PROT 5.8*  --   --   ALBUMIN 3.0* 2.7* 2.6*    Recent Labs Lab 01/18/16 1210  LIPASE 31    CBC:  Recent Labs Lab 01/18/16 1210  WBC 7.4  NEUTROABS 5.6  HGB 9.9*  HCT 31.7*  MCV 98.4  PLT 221   Cardiac Enzymes:  Recent Labs Lab 01/18/16 2052 01/19/16 0506  TROPONINI 0.37* 0.42*   CBG:  Recent Labs Lab 01/18/16 2032 01/19/16 0817 01/19/16 1137 01/19/16 1730 01/19/16 1957  GLUCAP 128* 135* 129* 129*  148*    Fasting Lipid Panel:  Recent Labs Lab 01/18/16 2052  CHOL 163  HDL 64  LDLCALC 79  TRIG 100  CHOLHDL 2.5   Studies/Results: Dg Chest 2 View  01/18/2016  CLINICAL DATA:  Shortness of breath.  Low O2 saturation. EXAM: CHEST  2 VIEW COMPARISON:  10/10/2015 and 11/14/2013 and 12/02/2014 FINDINGS: There is cardiomegaly with slight pulmonary vascular congestion. There is diffuse accentuation of the interstitial markings which I suspect represents pulmonary edema. Dense calcification in the mitral valve annulus. No acute bone abnormality. Tiny bilateral effusions. Extensive calcification in the aorta. IMPRESSION: Probable congestive heart failure with slight pulmonary edema and small effusions. Aortic atherosclerosis. Electronically Signed   By: Lorriane Shire M.D.   On: 01/18/2016 12:09   Dg Shoulder Right  01/18/2016  CLINICAL DATA:  Right shoulder pain. EXAM: RIGHT SHOULDER - 2+ VIEW COMPARISON:  None. FINDINGS: Diffuse osteopenia. No fracture or dislocation. No abnormal soft tissue calcification. Minimal degenerative changes at the acromioclavicular joint. Note is made of accentuation of the interstitial markings in the right lung, probably chronic. IMPRESSION: No acute abnormality of the right shoulder. Electronically Signed   By:  Lorriane Shire M.D.   On: 01/18/2016 12:05   Ct Head Wo Contrast  01/18/2016  CLINICAL DATA:  Left arm and hand weakness for 3-4 days. EXAM: CT HEAD WITHOUT CONTRAST TECHNIQUE: Contiguous axial images were obtained from the base of the skull through the vertex without intravenous contrast. COMPARISON:  10/07/2013 FINDINGS: There is a subacute nonhemorrhagic infarction in the right parietal lobe with effacement of the adjacent cortical sulci. New diffuse periventricular white matter lucency. Old lacunar infarcts in the basal ganglia and right side of the pons but new since the study of 2015. Progressive diffuse cerebral cortical atrophy with secondary ventricular  dilatation. No acute hemorrhage or mass lesion. Bones are normal except for slight mucosal thickening on the left side of the sphenoid sinus. IMPRESSION: 1. Subacute nonhemorrhagic infarction in the right parietal lobe. 2. Multiple old lacunar infarcts as well as extensive chronic periventricular small vessel disease. 3. Progressive atrophy. Electronically Signed   By: Lorriane Shire M.D.   On: 01/18/2016 15:28   Mr Brain Wo Contrast  01/19/2016  CLINICAL DATA:  LEFT upper extremity weakness, assess for stroke. History of end-stage renal disease on dialysis, diabetes, hypertension, hypercholesterolemia. EXAM: MRI HEAD WITHOUT CONTRAST TECHNIQUE: Multiplanar, multiecho pulse sequences of the brain and surrounding structures were obtained without intravenous contrast. COMPARISON:  CT head January 18, 2016 FINDINGS: Multiple sequences are moderately motion degraded. INTRACRANIAL CONTENTS: Confluent reduced diffusion within RIGHT posterior insula/operculum and, frontoparietal lobes measuring up to 4 x 7.3 cm, extending to the RIGHT periventricular white matter. Area of reduced diffusion demonstrates low ADC values in minimal FLAIR T2 hyperintense signal. No susceptibility artifact to suggest hemorrhage. Ventricles and sulci are normal for patient's age. Old small suspected bilateral basal ganglia lacunar infarcts with prominent thalamus perivascular spaces. Patchy to confluent supratentorial and pontine white matter FLAIR T2 hyperintensities. Old bilateral small cerebellar infarcts. No midline shift, mass effect or mass is though, motion degrades sensitivity. Normal major intracranial vascular flow voids present at skullbase. ORBITS: The included ocular globes and orbital contents are non-suspicious. Status post bilateral ocular lens implants. SINUSES: Less sphenoid sinus air-fluid level. Mastoid air cells are well aerated. SKULL/SOFT TISSUES: No abnormal sellar expansion. No suspicious calvarial bone marrow signal.  Craniocervical junction maintained. IMPRESSION: Motion degraded examination. Acute moderate RIGHT middle cerebral artery territory infarct correlating to today's CT abnormality. No hemorrhagic conversion. Moderate to severe chronic small vessel ischemic disease and old suspected bilateral basal ganglia lacunar infarcts. Electronically Signed   By: Elon Alas M.D.   On: 01/19/2016 00:12   Dg Knee Complete 4 Views Left  01/18/2016  CLINICAL DATA:  Left knee pain. EXAM: LEFT KNEE - COMPLETE 4+ VIEW COMPARISON:  04/30/2005 FINDINGS: The components of the total knee prosthesis appear in good position with no evidence of loosening. There is no fracture or dislocation or joint effusion. Diffuse osteopenia. IMPRESSION: No acute abnormality. Electronically Signed   By: Lorriane Shire M.D.   On: 01/18/2016 12:03   Medications: I have reviewed the patient's current medications. Scheduled Meds: . amitriptyline  10 mg Oral QHS  . aspirin  81 mg Oral Daily  . atorvastatin  40 mg Oral Daily  . calcitRIOL  0.5 mcg Oral Q M,W,F-HD  . cinacalcet  30 mg Oral Q supper  . febuxostat  40 mg Oral QHS  . feeding supplement  1 Container Oral BID BM  . heparin  5,000 Units Subcutaneous Q8H  . insulin aspart  0-5 Units Subcutaneous QHS  . insulin aspart  0-9 Units Subcutaneous TID WC  . sevelamer carbonate  2.4 g Oral TID WC  . sodium chloride flush  3 mL Intravenous Q12H   Continuous Infusions:  PRN Meds:.sodium chloride, sodium chloride, acetaminophen, alteplase, heparin, lidocaine (PF), lidocaine-prilocaine, oxyCODONE, pentafluoroprop-tetrafluoroeth Assessment/Plan: 82 year old woman with history of ESRD on HD, HTN, DM, CAD presenting with nausea and weakness found to have L hemineglect and L sided weakness. CT head with subacute infarct.   Acute R MCA territory infarct: CT of head with subacute non-hemorrhagic infarction in the R parietal lobe. MRI brain with an acute moderate R middle cerebral artery  territory infarct. Lipid panel with cholesterol 163, HDL 64, and LDL 79.  -Neurology following, appreciate recommendations -Aspirin daily -Lipitor 40 mg daily  -PT/OT -A1c pending  -Echo pending -Carotid dopplers -MRA head   Pulmonary edema, ESRD on HD:  -Nephrology following -HD per nephrology   DM: SSI  Depression: Continue home amitriptyline   CAD: Aspirin daily   Gout: Continue home febuxostat  VTE ppx: subcutaneous heparin   Code: DNR  Dispo: Disposition is deferred at this time, awaiting improvement of current medical problems.  Anticipated discharge in approximately 1-2 day(s).   The patient does have a current PCP Myer Peer, MD) and does not need an [facility] hospital follow-up appointment after discharge.  The patient does not know have transportation limitations that hinder transportation to clinic appointments.  .Services Needed at time of discharge: Y = Yes, Blank = No PT:   OT:   RN:   Equipment:   Other:     LOS: 2 days   Milagros Loll, MD 01/20/2016, 7:59 AM

## 2016-01-20 NOTE — Evaluation (Signed)
Physical Therapy Evaluation Patient Details Name: [name] [name] MRN: [id number] DOB: 1926-08-21 Today's Date: 01/20/2016   History of Present Illness  Patient is an 82 yo female admitted 01/18/16 with Lt sided weakness, LLE pain, and Rt shoulder pain.  Patient with Rt MCA CVA.      PMHx of DM2, HTN, HLD, ESRD on HD, CAD, HOCM, and GERD   Clinical Impression  Patient presents with problems listed below.  Will benefit from acute PT to maximize functional mobility prior to discharge.  Recommend SNF at d/c for continued therapy.    Follow Up Recommendations SNF;Supervision/Assistance - 24 hour    Equipment Recommendations  None recommended by PT    Recommendations for Other Services       Precautions / Restrictions Precautions Precautions: Fall Precaution Comments: Mult falls pta Restrictions Weight Bearing Restrictions: No      Mobility  Bed Mobility Overal bed mobility: Needs Assistance Bed Mobility: Supine to Sit;Sit to Supine     Supine to sit: Max assist Sit to supine: Max assist   General bed mobility comments: Verbal cues to try to roll to side - patient unable to tolerate due to pain.  Assisted patient to move LE's toward EOB.  Used bed pad to bring hips to EOB.  Max assist to raise trunk to upright position.  Patient pushing backward in sitting.  Worked on shifting weight forward for balance.  Initially required max assist for balance, and improved to min assist.  Patient with difficulty using LUE functionally during transitions.  Appeared to "lose" LUE.  When asked to move LUE, was able to complete.    Returned to supine with max assist.  Transfers                 General transfer comment: NT  Ambulation/Gait                Stairs            Wheelchair Mobility    Modified Rankin (Stroke Patients Only) Modified Rankin (Stroke Patients Only) Pre-Morbid Rankin Score: Moderately severe disability Modified Rankin: Severe disability      Balance Overall balance assessment: Needs assistance Sitting-balance support: Single extremity supported;Feet supported Sitting balance-Leahy Scale: Zero   Postural control: Posterior lean                                   Pertinent Vitals/Pain Pain Assessment: 0-10 Pain Score: 9  Pain Location: Lt knee Pain Descriptors / Indicators: Aching;Sore;Tender Pain Intervention(s): Limited activity within patient's tolerance;Monitored during session;Repositioned    Home Living Family/patient expects to be discharged to:: Skilled nursing facility   Available Help at Discharge: [place] Type of Home: Assisted living         Home Equipment: Wheelchair - manual Additional Comments: Patient was admitted from ALF.  Will need SNF at d/c    Prior Function Level of Independence: Needs assistance   Gait / Transfers Assistance Needed: Using w/c for mobility due to multiple falls  ADL's / Homemaking Assistance Needed: Assist for all ADL's. Facility provides meals and housekeeping.        Hand Dominance        Extremity/Trunk Assessment   Upper Extremity Assessment: Defer to OT evaluation           Lower Extremity Assessment: Generalized weakness;LLE deficits/detail   LLE Deficits / Details: Decreased knee extension today due to  pain during movement.  Strength/ROM limited by pain.  Able to initiate movement against gravity.   Noted decreased control of movements.  Cervical / Trunk Assessment: Kyphotic  Communication   Communication: Expressive difficulties  Cognition Arousal/Alertness: Awake/alert Behavior During Therapy: Flat affect Overall Cognitive Status: Within Functional Limits for tasks assessed                      General Comments      Exercises        Assessment/Plan    PT Assessment Patient needs continued PT services  PT Diagnosis Generalized weakness;Acute pain;Hemiplegia dominant side;Difficulty walking   PT  Problem List Decreased strength;Decreased activity tolerance;Decreased balance;Decreased mobility;Decreased coordination;Decreased knowledge of use of DME;Cardiopulmonary status limiting activity;Pain  PT Treatment Interventions DME instruction;Gait training;Functional mobility training;Therapeutic activities;Balance training;Neuromuscular re-education;Patient/family education   PT Goals (Current goals can be found in the Care Plan section) Acute Rehab PT Goals Patient Stated Goal: To get stronger PT Goal Formulation: With patient Time For Goal Achievement: 02/03/16 Potential to Achieve Goals: Fair    Frequency Min 3X/week   Barriers to discharge        Co-evaluation               End of Session Equipment Utilized During Treatment: Oxygen Activity Tolerance: Patient limited by fatigue;Patient limited by pain Patient left: in bed;with call bell/phone within reach;with bed alarm set Nurse Communication: Mobility status         Time: [id number] PT Time Calculation (min) (ACUTE ONLY): 13 min   Charges:   PT Evaluation $PT Eval Moderate Complexity: 1 Procedure     PT G CodesDespina Pole 2016-02-10, 6:32 PM Carita Pian. Sanjuana Kava, [place] Pager [phone]

## 2016-01-20 NOTE — Progress Notes (Signed)
STROKE TEAM PROGRESS NOTE   HISTORY OF PRESENT ILLNESS [name] [name] is a 82 y.o. female with a history of DM, ESRD who presented with pulmonary edema and left hand weakness. It is unclear when it started. It was noted in the ED that her hand felt limp and a CT showed subacute infarct. The MRI confirmed an acute posterior div MCA infarct. She continues to have left sided weakness  LKW: unclear tpa given?: no, unclear time of onset.    SUBJECTIVE (INTERVAL HISTORY) No family members present and initially. Later the patient's sister arrived. Dr. Leonie Man explained that he felt this was an embolic stroke. He recommended changing the patient to Plavix.   OBJECTIVE Temp:  [98.2 F (36.8 C)-98.9 F (37.2 C)] 98.4 F (36.9 C) (04/30 0752) Pulse Rate:  [66-84] 69 (04/30 0752) Cardiac Rhythm:  [-] Heart block (04/29 1900) Resp:  [16-20] 16 (04/30 0752) BP: (111-130)/(28-46) 117/36 mmHg (04/30 0752) SpO2:  [95 %-100 %] 98 % (04/30 0752) Weight:  [65.318 kg (144 lb)] 65.318 kg (144 lb) (04/29 2001)  CBC:  Recent Labs Lab 01/18/16 1210  WBC 7.4  NEUTROABS 5.6  HGB 9.9*  HCT 31.7*  MCV 98.4  PLT [id number]    Basic Metabolic Panel:  Recent Labs Lab 01/19/16 0506 01/20/16 0641  NA 138 136  K 4.0 4.2  CL 99* 96*  CO2 29 27  GLUCOSE 145* 156*  BUN 14 30*  CREATININE 2.88* 4.91*  CALCIUM 8.1* 8.1*  PHOS 4.0 5.2*    Lipid Panel:    Component Value Date/Time   CHOL 163 01/18/2016 2052   TRIG 100 01/18/2016 2052   HDL 64 01/18/2016 2052   CHOLHDL 2.5 01/18/2016 2052   VLDL 20 01/18/2016 2052   LDLCALC 79 01/18/2016 2052   HgbA1c:  Lab Results  Component Value Date   HGBA1C 6.4* 11/08/2013   Urine Drug Screen: No results found for: LABOPIA, COCAINSCRNUR, LABBENZ, AMPHETMU, THCU, LABBARB    IMAGING  Dg Chest 2 View 01/18/2016   Probable congestive heart failure with slight pulmonary edema and small effusions. Aortic atherosclerosis.   Dg Shoulder Right 01/18/2016   No  acute abnormality of the right shoulder.   Ct Head Wo Contrast 01/18/2016   1. Subacute nonhemorrhagic infarction in the right parietal lobe.  2. Multiple old lacunar infarcts as well as extensive chronic periventricular small vessel disease.  3. Progressive atrophy.     Mr Brain Wo Contrast 01/19/2016   Motion degraded examination. Acute moderate RIGHT middle cerebral artery territory infarct correlating to today's CT abnormality. No hemorrhagic conversion. Moderate to severe chronic small vessel ischemic disease and old suspected bilateral basal ganglia lacunar infarcts.   Dg Knee Complete 4 Views Left 01/18/2016   No acute abnormality.   MRA head without Contrast 09/20/2016 1. No major branch occlusion or significant proximal stenosis. 2. Moderate focal stenosis of a distal right M2 branch vessel. 3. Possible 1.5 mm right superior hypophyseal region ICA aneurysm.    PHYSICAL EXAM Elderly lady not in distress. . Afebrile. Head is nontraumatic. Neck is supple without bruit.    Cardiac exam no murmur or gallop. Lungs are clear to auscultation. Distal pulses are well felt.  Neurological Exam :  Awake alert oriented x 3 normal speech and language. Mild left lower face asymmetry. Tongue midline. No drift. Mild diminished fine finger movements on left. Orbits right over left upper extremity. Mild left grip weak.. Normal sensation . Normal coordination.     ASSESSMENT/PLAN  [name] [name] is a 82 y.o. female with history of end-stage renal disease, diabetes mellitus, hyperlipidemia, hypertension, anemia of chronic disease, coronary artery disease, and hypertrophic obstructive cardiomyopathy presenting with pulmonary edema and left hemiparesis. She did not receive IV t-PA due to unknown time of onset.  Stroke:  Non-dominant right MCA territory infarct felt to be embolic from an unknown source.   Resultant  left hemiparesis  MRI  Acute moderate RIGHT middle cerebral artery  territory infarct   MRA  Moderate focal stenosis of a distal right M2 branch vessel.  Carotid Doppler pending  2D Echo pending  LDL 79  HgbA1c pending  VTE prophylaxis - subcutaneous heparin  DIET DYS 3 Room service appropriate?: Yes; Fluid consistency:: Thin  aspirin 81 mg daily prior to admission, now on aspirin 325 mg daily change to Plavix per Dr. Leonie Man  Patient counseled to be compliant with her antithrombotic medications  Ongoing aggressive stroke risk factor management  Therapy recommendations:  Pending  Disposition: Pending  Hypertension  Stable  Permissive hypertension (OK if < 220/120) but gradually normalize in 5-7 days  Hyperlipidemia  Home meds: Lipitor 20 mg daily - increased to 40 mg daily  LDL 79, goal < 70  Now on Lipitor 40 mg daily  Continue statin at discharge  Diabetes  HgbA1c pending, goal < 7.0  Controlled  Other Stroke Risk Factors  Advanced age  Old infarcts on imaging  Family hx stroke (mother)  Coronary artery disease   Other Active Problems  Anemia of chronic disease  End-stage renal disease  Hospital day # [address] PA-C [facility] Pager [phone] 01/20/2016, 5:53 PM I have personally examined this patient, reviewed notes, independently viewed imaging studies, participated in medical decision making and plan of care. I have made any additions or clarifications directly to the above note. Agree with note above.She presented with left hand weakness due to embolic right MCA branch infarct and remains at risk for neurologic worsening and recurrent stroke/TIA. Continue ongoing stroke w/u. Greater than  50 % time during this 25 minute visit was spent on counselling and coordination of care about stroke risk.Stop aspirin and change to Plavix  Antony Contras, MD Medical Director [facility] Pager: [phone] 01/20/2016 10:54 PM     To contact Stroke Continuity provider, please  refer to [email or web address] After hours, contact General Neurology

## 2016-01-20 NOTE — Progress Notes (Signed)
OT Cancellation Note  Patient Details Name: ANOUK BILBERRY MRN: [id number] DOB: 1926/04/07   Cancelled Treatment:    Reason Eval/Treat Not Completed:  (Troponin trending up according to last lab value.)  Neiko Trivedi L OTR/L 01/20/2016  01/20/2016, 3:51 PM

## 2016-01-20 NOTE — Plan of Care (Signed)
Problem: Education: Goal: Knowledge of disease or condition will improve Outcome: Progressing Stroke booklet: "Early stages to recovery" provided to patient.

## 2016-01-21 ENCOUNTER — Inpatient Hospital Stay (HOSPITAL_COMMUNITY): Payer: Medicare Other

## 2016-01-21 DIAGNOSIS — Z992 Dependence on renal dialysis: Secondary | ICD-10-CM

## 2016-01-21 DIAGNOSIS — M109 Gout, unspecified: Secondary | ICD-10-CM

## 2016-01-21 DIAGNOSIS — Z794 Long term (current) use of insulin: Secondary | ICD-10-CM

## 2016-01-21 DIAGNOSIS — I6789 Other cerebrovascular disease: Secondary | ICD-10-CM

## 2016-01-21 DIAGNOSIS — I639 Cerebral infarction, unspecified: Secondary | ICD-10-CM | POA: Insufficient documentation

## 2016-01-21 DIAGNOSIS — E1122 Type 2 diabetes mellitus with diabetic chronic kidney disease: Secondary | ICD-10-CM

## 2016-01-21 DIAGNOSIS — N186 End stage renal disease: Secondary | ICD-10-CM | POA: Insufficient documentation

## 2016-01-21 DIAGNOSIS — IMO0001 Reserved for inherently not codable concepts without codable children: Secondary | ICD-10-CM | POA: Insufficient documentation

## 2016-01-21 DIAGNOSIS — I10 Essential (primary) hypertension: Secondary | ICD-10-CM | POA: Insufficient documentation

## 2016-01-21 DIAGNOSIS — E119 Type 2 diabetes mellitus without complications: Secondary | ICD-10-CM

## 2016-01-21 DIAGNOSIS — M1000 Idiopathic gout, unspecified site: Secondary | ICD-10-CM | POA: Insufficient documentation

## 2016-01-21 LAB — ECHOCARDIOGRAM COMPLETE
HEIGHTINCHES: 64 in
Weight: 2304 oz

## 2016-01-21 LAB — GLUCOSE, CAPILLARY
GLUCOSE-CAPILLARY: 157 mg/dL — AB (ref 65–99)
Glucose-Capillary: 185 mg/dL — ABNORMAL HIGH (ref 65–99)

## 2016-01-21 LAB — RENAL FUNCTION PANEL
ALBUMIN: 2.7 g/dL — AB (ref 3.5–5.0)
Anion gap: 12 (ref 5–15)
BUN: 43 mg/dL — AB (ref 6–20)
CO2: 29 mmol/L (ref 22–32)
CREATININE: 6.13 mg/dL — AB (ref 0.44–1.00)
Calcium: 7.8 mg/dL — ABNORMAL LOW (ref 8.9–10.3)
Chloride: 95 mmol/L — ABNORMAL LOW (ref 101–111)
GFR calc Af Amer: 6 mL/min — ABNORMAL LOW (ref >60)
GFR, EST NON AFRICAN AMERICAN: 5 mL/min — AB (ref >60)
Glucose, Bld: 161 mg/dL — ABNORMAL HIGH (ref 65–99)
PHOSPHORUS: 5.7 mg/dL — AB (ref 2.5–4.6)
Potassium: 4.5 mmol/L (ref 3.5–5.1)
Sodium: 136 mmol/L (ref 135–145)

## 2016-01-21 LAB — CBC
HCT: 28.5 % — ABNORMAL LOW (ref 36.0–46.0)
Hemoglobin: 9.1 g/dL — ABNORMAL LOW (ref 12.0–15.0)
MCH: 31.4 pg (ref 26.0–34.0)
MCHC: 31.9 g/dL (ref 30.0–36.0)
MCV: 98.3 fL (ref 78.0–100.0)
PLATELETS: 226 10*3/uL (ref 150–400)
RBC: 2.9 MIL/uL — ABNORMAL LOW (ref 3.87–5.11)
RDW: 14.3 % (ref 11.5–15.5)
WBC: 8.2 10*3/uL (ref 4.0–10.5)

## 2016-01-21 LAB — HEMOGLOBIN A1C
HEMOGLOBIN A1C: 7.6 % — AB (ref 4.8–5.6)
Mean Plasma Glucose: 171 mg/dL

## 2016-01-21 MED ORDER — OXYCODONE HCL 5 MG PO TABS
ORAL_TABLET | ORAL | Status: AC
  Filled 2016-01-21: qty 1

## 2016-01-21 MED ORDER — ATORVASTATIN CALCIUM 40 MG PO TABS: 40.0000 mg | ORAL_TABLET | Freq: Every day | ORAL | Status: AC

## 2016-01-21 MED ORDER — CLOPIDOGREL BISULFATE 75 MG PO TABS: 75.0000 mg | ORAL_TABLET | Freq: Every day | ORAL | Status: AC

## 2016-01-21 NOTE — Progress Notes (Addendum)
Subjective: No acute events overnight. Doing well this morning. Denies SOB or CP.   Objective: Vital signs in last 24 hours: Filed Vitals:   01/20/16 1617 01/20/16 1947 01/21/16 0456 01/21/16 0831  BP: 134/38 119/36 121/32 136/39  Pulse: 67 63 63 68  Temp: 98.6 F (37 C) 98.7 F (37.1 C) 98 F (36.7 C) 97.7 F (36.5 C)  TempSrc: Oral   Oral  Resp: 17 18 20 18   Height:      Weight:      SpO2: 100% 95% 100% 96%   Weight change:   Intake/Output Summary (Last 24 hours) at 01/21/16 0949 Last data filed at 01/20/16 1839  Gross per 24 hour  Intake    483 ml  Output      0 ml  Net    483 ml   General Apperance: NAD HEENT: Normocephalic, atraumatic, anicteric sclera Neck: Supple, trachea midline Lungs: Clear to auscultation bilaterally. Heart: Regular rate and rhythm Abdomen: Soft, nondistended Extremities: Warm and well perfused, no edema Skin: No rashes or lesions Neurologic: Alert and interactive. 4/5 LUE and LLE.  Lab Results: Basic Metabolic Panel:  Recent Labs Lab 01/20/16 0641 01/21/16 0532  NA 136 136  K 4.2 4.5  CL 96* 95*  CO2 27 29  GLUCOSE 156* 161*  BUN 30* 43*  CREATININE 4.91* 6.13*  CALCIUM 8.1* 7.8*  PHOS 5.2* 5.7*   Liver Function Tests:  Recent Labs Lab 01/18/16 1210  01/20/16 0641 01/21/16 0532  AST 20  --   --   --   ALT 14  --   --   --   ALKPHOS 78  --   --   --   BILITOT 0.4  --   --   --   PROT 5.8*  --   --   --   ALBUMIN 3.0*  < > 2.6* 2.7*  < > = values in this interval not displayed.  Recent Labs Lab 01/18/16 1210  LIPASE 31    CBC:  Recent Labs Lab 01/18/16 1210 01/21/16 0532  WBC 7.4 8.2  NEUTROABS 5.6  --   HGB 9.9* 9.1*  HCT 31.7* 28.5*  MCV 98.4 98.3  PLT 221 226   Cardiac Enzymes:  Recent Labs Lab 01/18/16 2052 01/19/16 0506  TROPONINI 0.37* 0.42*   CBG:  Recent Labs Lab 01/19/16 1957 01/20/16 0746 01/20/16 1201 01/20/16 1615 01/20/16 2020 01/21/16 0806  GLUCAP 148* 138* 164* 206*  149* 157*    Fasting Lipid Panel:  Recent Labs Lab 01/18/16 2052  CHOL 163  HDL 64  LDLCALC 79  TRIG 100  CHOLHDL 2.5   Studies/Results: Mr [facility] Contrast  01/20/2016  CLINICAL DATA:  Left hand weakness.  Right MCA infarct on MRI. EXAM: MRA HEAD WITHOUT CONTRAST TECHNIQUE: Angiographic images of the Circle of Willis were obtained using MRA technique without intravenous contrast. COMPARISON:  None. FINDINGS: The visualized distal vertebral arteries are patent to the basilar with the right being dominant. Apparent slight narrowing of the distal left vertebral artery at the vertebrobasilar junction may be due to motion artifact. Right PICA origin is patent. Left AICA is mildly dominant. SCA origins are patent. Basilar artery is patent without stenosis. There is a patent right posterior communicating artery. PCAs are unremarkable. Internal carotid arteries are patent from skullbase to carotid termini without stenosis. There is suggestion of a 1.5 mm right ICA aneurysm projecting inferomedially in the superior hypophyseal region. ACAs and MCAs are patent without  evidence of major branch occlusion or significant proximal stenosis. There is a focal moderate stenosis of a distal right M2 branch vessel posteriorly in the sylvian fissure. IMPRESSION: 1. No major branch occlusion or significant proximal stenosis. 2. Moderate focal stenosis of a distal right M2 branch vessel. 3. Possible 1.5 mm right superior hypophyseal region ICA aneurysm. Electronically Signed   By: Logan Bores M.D.   On: 01/20/2016 11:54   Medications: I have reviewed the patient's current medications. Scheduled Meds: . amitriptyline  10 mg Oral QHS  . atorvastatin  40 mg Oral Daily  . calcitRIOL  0.5 mcg Oral Q M,W,F-HD  . cinacalcet  30 mg Oral Q supper  . clopidogrel  75 mg Oral Daily  . febuxostat  40 mg Oral QHS  . feeding supplement  1 Container Oral BID BM  . heparin  5,000 Units Subcutaneous Q8H  . insulin aspart   0-5 Units Subcutaneous QHS  . insulin aspart  0-9 Units Subcutaneous TID WC  . sevelamer carbonate  2.4 g Oral TID WC  . sodium chloride flush  3 mL Intravenous Q12H   Continuous Infusions:  PRN Meds:.sodium chloride, sodium chloride, acetaminophen, alteplase, heparin, lidocaine (PF), lidocaine-prilocaine, oxyCODONE, pentafluoroprop-tetrafluoroeth Assessment/Plan: 82 year old woman with history of ESRD on HD, HTN, DM, CAD presenting with nausea and weakness found to have L hemineglect and L sided weakness. CT head with subacute infarct.   Acute R MCA territory infarct: CT of head with subacute non-hemorrhagic infarction in the R parietal lobe. MRI brain with an acute moderate R middle cerebral artery territory infarct. MRA head with no major branch occlusion or significant proximal stenosis. Moderate focal stenosis of a distal right M2 branch vessel. Lipid panel with cholesterol 163, HDL 64, and LDL 79.  -Neurology following, appreciate recommendations -Plavix daily -Lipitor 40 mg daily  -PT/OT -A1c pending  -Echo pending -Carotid dopplers  Pulmonary edema, ESRD on HD:  -Nephrology following -HD per nephrology   DM: SSI  Depression: Continue home amitriptyline   CAD: Aspirin daily   Gout: Continue home febuxostat  VTE ppx: subcutaneous heparin   Code: DNR  Dispo: Awaiting completion of carotid dopplers and echo then SNF placement.  Anticipated discharge in approximately 0-1 day(s).   The patient does have a current PCP Myer Peer, MD) and does not need an [facility] hospital follow-up appointment after discharge.  The patient does not know have transportation limitations that hinder transportation to clinic appointments.  .Services Needed at time of discharge: Y = Yes, Blank = No PT:   OT:   RN:   Equipment:   Other:     LOS: 3 days   Milagros Loll, MD 01/21/2016, 9:49 AM

## 2016-01-21 NOTE — Progress Notes (Signed)
Physical Therapy Treatment Patient Details Name: [name] [name] MRN: [id number] DOB: 10-05-1925 Today's Date: 01/21/2016    History of Present Illness Patient is an 82 yo female admitted 01/18/16 with Lt sided weakness, LLE pain, and Rt shoulder pain.  Patient with Rt MCA CVA.      PMHx of DM2, HTN, HLD, ESRD on HD, CAD, HOCM, and GERD     PT Comments    Continuing work on activity tolerance; Able to transfer OOB to chair via squat pivot transfer with +2 max assist; Noting improvements in sitting tolerance and balance  Follow Up Recommendations  SNF;Supervision/Assistance - 24 hour     Equipment Recommendations  None recommended by PT    Recommendations for Other Services       Precautions / Restrictions Precautions Precautions: Fall Precaution Comments: Mult falls pta Restrictions Weight Bearing Restrictions: No    Mobility  Bed Mobility Overal bed mobility: Needs Assistance Bed Mobility: Supine to Sit     Supine to sit: Max assist     General bed mobility comments: max assis to to elevate trunk to supright seated position; good initiation of moving feet off of EOB  Transfers Overall transfer level: Needs assistance Equipment used: 2 person hand held assist (and gait belt and bed pad) Transfers: Squat Pivot Transfers     Squat pivot transfers: +2 physical assistance;Max assist     General transfer comment: verbal and tactile cues to initiate transfer with anterior weight shift; required bilat knee blocking for stability during transfer  Ambulation/Gait                 Stairs            Wheelchair Mobility    Modified Rankin (Stroke Patients Only) Modified Rankin (Stroke Patients Only) Pre-Morbid Rankin Score: Moderately severe disability Modified Rankin: Severe disability     Balance Overall balance assessment: Needs assistance Sitting-balance support: Bilateral upper extremity supported;Feet supported Sitting balance-Leahy Scale: Poor  (approaching Fair) Sitting balance - Comments: Posterior lean present again initially; worked on sitting upright and anterior weight shifting in prep for transfer; Able to maintain upright sitting with anterior pelvic tilt for approx 20 seconds before drifting back into slumped posture and posterior lean Postural control: Posterior lean                          Cognition Arousal/Alertness: Awake/alert Behavior During Therapy: Flat affect Overall Cognitive Status: Within Functional Limits for tasks assessed                      Exercises      General Comments        Pertinent Vitals/Pain Pain Assessment: No/denies pain    Home Living Family/patient expects to be discharged to:: Skilled nursing facility   Available Help at Discharge: [place] Type of Home: Assisted living       Home Equipment: Wheelchair - manual Additional Comments: Patient was admitted from ALF.  Will need SNF at d/c    Prior Function Level of Independence: Needs assistance  Gait / Transfers Assistance Needed: Using w/c for mobility due to multiple falls ADL's / Homemaking Assistance Needed: Assist for all ADL's. Facility provides meals and housekeeping. Comments: Pt reports that staff at ALF would assist with ADLs and functional transfers, normally stand pivot transfers only - no ambulation.    PT Goals (current goals can now be found in the care plan section) Acute  Rehab PT Goals Patient Stated Goal: To get stronger PT Goal Formulation: With patient Time For Goal Achievement: 02/03/16 Potential to Achieve Goals: Fair Progress towards PT goals: Progressing toward goals (slowly)    Frequency  Min 3X/week    PT Plan Current plan remains appropriate    Co-evaluation             End of Session Equipment Utilized During Treatment: Gait belt Activity Tolerance: Patient limited by fatigue Patient left: in chair;with call bell/phone within reach;with chair alarm  set     Time: [phone] (End time is approximate) PT Time Calculation (min) (ACUTE ONLY): 19 min  Charges:  $Therapeutic Activity: 8-22 mins                    G Codes:      Quin Hoop 01/21/2016, 4:05 PM  Roney Marion, [place] Pager [phone] Office [phone]

## 2016-01-21 NOTE — Progress Notes (Signed)
Speech Language Pathology Treatment: Dysphagia  Patient Details Name: HALEI HANOVER MRN: [id number] DOB: 1925-10-11 Today's Date: 01/21/2016 Time: 0945-1000 SLP Time Calculation (min) (ACUTE ONLY): 15 min  Assessment / Plan / Recommendation Clinical Impression  Pt is tolerating diet, but refused solids due to poor appetite. She reports she is happy with the diet consistency. No signs of aspiration with thin liquids. Will sign off. No f/u needed.    HPI        SLP Plan  All goals met     Recommendations  Diet recommendations: Dysphagia 3 (mechanical soft);Thin liquid Liquids provided via: Cup;Straw Medication Administration: Whole meds with liquid Supervision: Patient able to self feed Compensations: Slow rate;Small sips/bites;Follow solids with liquid Postural Changes and/or Swallow Maneuvers: Seated upright 90 degrees             Follow up Recommendations: None Plan: All goals met     GO               Herbie Baltimore, MA CCC-SLP [phone]  Lynann Beaver 01/21/2016, 10:44 AM

## 2016-01-21 NOTE — Evaluation (Signed)
Occupational Therapy Evaluation Patient Details Name: [name] [name] MRN: [id number] DOB: Jan 04, 1926 Today's Date: 01/21/2016    History of Present Illness Patient is an 82 yo female admitted 01/18/16 with Lt sided weakness, LLE pain, and Rt shoulder pain.  Patient with Rt MCA CVA.      PMHx of DM2, HTN, HLD, ESRD on HD, CAD, HOCM, and GERD    Clinical Impression   Patient presenting with decreased ADL and functional mobility independence secondary to above. Patient required assistance with ADL and functional transfers/mobility PTA, pt states she was completing simple stand pivot transfer and not ambulating PTA. Patient currently functioning at an overall total assist level. Patient will benefit from acute OT to increase overall independence in the areas of ADLs, functional mobility, and overall safety in order to safely discharge to venue listed below.     Follow Up Recommendations  SNF;Supervision/Assistance - 24 hour    Equipment Recommendations  Other (comment) (TBD next venue of care)    Recommendations for Other Services  None at this time    Precautions / Restrictions Precautions Precautions: Fall Precaution Comments: Mult falls pta Restrictions Weight Bearing Restrictions: No    Mobility Bed Mobility General bed mobility comments: Pt found seated in recliner upon OT entering/exiting room  Transfers General transfer comment: Pt in recliner after PT assisted her, see PT note for more information.          ADL Overall ADL's : Needs assistance/impaired General ADL Comments: Pt overall total assist for ADL at this time. Pt able to feed self with RUE (pt is left hand dominant), but unable to with left hand without total assist from therapist. Pt requires +2 for OOB mobility, at this time recommend bed level ADL.      Vision Vision Assessment?: Vision impaired- to be further tested in functional context;Yes Eye Alignment: Impaired (comment) Ocular Range of Motion:  Restricted on the right;Restricted on the left Alignment/Gaze Preference: Within Defined Limits Tracking/Visual Pursuits: Impaired - to be further tested in functional context   Perception Perception Perception Tested?: Yes Perception Deficits: Inattention/neglect;Figure ground;Spatial orientation Inattention/Neglect: Does not attend to left side of body   Praxis Praxis Praxis tested?: Deficits Deficits: Motor Impersistence    Pertinent Vitals/Pain Pain Assessment: No/denies pain     Hand Dominance Left   Extremity/Trunk Assessment Upper Extremity Assessment Upper Extremity Assessment: LUE deficits/detail;RUE deficits/detail RUE Deficits / Details: decreased strength and coordination  RUE Coordination: decreased fine motor;decreased gross motor LUE Deficits / Details: decreased strength and coordination, increased ataxia throughout LUE  LUE Sensation: decreased proprioception LUE Coordination: decreased fine motor;decreased gross motor   Lower Extremity Assessment Lower Extremity Assessment: Defer to PT evaluation   Cervical / Trunk Assessment Cervical / Trunk Assessment: Kyphotic   Communication Communication Communication: Expressive difficulties   Cognition Arousal/Alertness: Awake/alert Behavior During Therapy: Flat affect Overall Cognitive Status: Within Functional Limits for tasks assessed              Home Living Family/patient expects to be discharged to:: Skilled nursing facility   Available Help at Discharge: [place] Type of Home: Assisted living Home Equipment: Wheelchair - manual   Additional Comments: Patient was admitted from ALF.  Will need SNF at d/c      Prior Functioning/Environment Level of Independence: Needs assistance  Gait / Transfers Assistance Needed: Using w/c for mobility due to multiple falls ADL's / Homemaking Assistance Needed: Assist for all ADL's. Facility provides meals and housekeeping.   Comments: Pt  reports that staff at ALF would assist with ADLs and functional transfers, normally stand pivot transfers only - no ambulation.     OT Diagnosis: Generalized weakness;Disturbance of vision;Hemiplegia dominant side;Ataxia   OT Problem List: Decreased strength;Decreased range of motion;Decreased activity tolerance;Impaired balance (sitting and/or standing);Impaired vision/perception;Decreased coordination;Decreased safety awareness;Decreased knowledge of use of DME or AE;Decreased knowledge of precautions;Impaired sensation;Impaired tone;Impaired UE functional use   OT Treatment/Interventions: Self-care/ADL training;Therapeutic exercise;Energy conservation;DME and/or AE instruction;Therapeutic activities;Patient/family education;Balance training    OT Goals(Current goals can be found in the care plan section) Acute Rehab OT Goals Patient Stated Goal: To get stronger OT Goal Formulation: With patient Time For Goal Achievement: 02/04/16 Potential to Achieve Goals: Good ADL Goals Pt Will Perform Eating: with mod assist;sitting;with adaptive utensils (hand over hand using L hand) Pt Will Perform Grooming: with mod assist;with adaptive equipment;sitting (hand over hand using L hand) Pt Will Perform Upper Body Dressing: sitting;with min assist Pt Will Perform Lower Body Dressing: with mod assist;sit to/from stand Pt Will Transfer to Toilet: with max assist;bedside commode;stand pivot transfer Pt/caregiver will Perform Home Exercise Program: Increased ROM;Increased strength;Left upper extremity;With minimal assist;With written HEP provided  OT Frequency: Min 3X/week   Barriers to D/C: Decreased caregiver support   End of Session Equipment Utilized During Treatment: Oxygen  Activity Tolerance: Patient tolerated treatment well Patient left: in chair;with call bell/phone within reach   Time: 1252-1315 OT Time Calculation (min): 23 min Charges:  OT General Charges $OT Visit: 1 Procedure OT  Evaluation $OT Eval Moderate Complexity: 1 Procedure OT Treatments $Self Care/Home Management : 8-22 mins  Chrys Racer , MS, OTR/L, CLT Pager: [phone]  01/21/2016, 2:18 PM

## 2016-01-21 NOTE — Progress Notes (Signed)
Patient ID: [name] [name], female   DOB: Dec 18, 1925, 82 y.o.   MRN: [id number] Medicine attending: I personally examined this patient this morning and reviewed pertinent clinical, laboratory, and x-ray data and I concur with the evaluation and management plan as recorded by resident physician Dr. Jacques Earthly which we discussed together. 82 year old woman with hypertension, diabetes, and end-stage renal disease on dialysis who presented on April 28 with left upper extremity weakness with findings of an acute right MCA territory infarct. Associated moderate to severe chronic small vessel ischemic disease and old suspected bilateral basal ganglia lacunar infarcts. MR angiogram showed no large artery occlusion or significant proximal stenoses. Possible 1.5 mm right superior hypophyseal region ICA aneurysm. Internal carotid arteries patent. I do not find aspirin on her home medication list. According to other examiners she was taking 81 mg.  She was given aspirin 325 mg at time of admission which was continued through April 30  then stopped. She is starting on Plavix 75 mg today May 1 at the advice of neurology consultant.. Echocardiogram done May 1 shows a hyperdynamic left ventricle with moderate left ventricular hypertrophy and estimated ejection fraction 70-75 percent. Severely calcified mitral valve annulus. Mild to moderate mitral regurgitation. Normal right ventricular cavity size and systolic function.  On my exam today, she has bilateral iridectomies right more prominent than left. No facial asymmetry. Tongue is midline. Palate elevates symmetrically. Full extraocular movements. Motor strength is 5 over 5 all extremities with trace weakness in extension at both ankles. Bilateral knee replacements. Tendon reflexes absent at the knees because of surgery. 1+ reflexes at the biceps. Upper body coordination normal.  Physical and occupational therapy recommending 24-hour supervision in the setting of a  skilled nursing facility. We will have our care management team assess whether or not the assisted living facility that she was residing in can accommodate more skilled needs. We discussed this with the patient this morning.

## 2016-01-21 NOTE — Progress Notes (Signed)
*  PRELIMINARY RESULTS* Echocardiogram 2D Echocardiogram has been performed.  Leavy Cella 01/21/2016, 10:46 AM

## 2016-01-21 NOTE — Progress Notes (Signed)
STROKE TEAM PROGRESS NOTE   HISTORY OF PRESENT ILLNESS [name] [name] is a 82 y.o. female with a history of DM, ESRD who presented with pulmonary edema and left hand weakness. It is unclear when it started. It was noted in the ED that her hand felt limp and a CT showed subacute infarct. The MRI confirmed an acute posterior div MCA infarct. She continues to have left sided weakness  LKW: unclear tpa given?: no, unclear time of onset.    SUBJECTIVE (INTERVAL HISTORY) Patient is stable . No new complaints today.Echo shows hyperdynamic left ventricle but no cardiac source of embolism  OBJECTIVE Temp:  [97 F (36.1 C)-98.7 F (37.1 C)] 97 F (36.1 C) (05/01 1512) Pulse Rate:  [63-81] 81 (05/01 1630) Cardiac Rhythm:  [-] Heart block;Normal sinus rhythm (05/01 0749) Resp:  [18-20] 20 (05/01 1512) BP: (119-136)/(32-49) 123/45 mmHg (05/01 1630) SpO2:  [95 %-100 %] 96 % (05/01 0831) Weight:  [148 lb 5.9 oz (67.3 kg)] 148 lb 5.9 oz (67.3 kg) (05/01 1512)  CBC:   Recent Labs Lab 01/18/16 1210 01/21/16 0532  WBC 7.4 8.2  NEUTROABS 5.6  --   HGB 9.9* 9.1*  HCT 31.7* 28.5*  MCV 98.4 98.3  PLT 221 [id number]    Basic Metabolic Panel:   Recent Labs Lab 01/20/16 0641 01/21/16 0532  NA 136 136  K 4.2 4.5  CL 96* 95*  CO2 27 29  GLUCOSE 156* 161*  BUN 30* 43*  CREATININE 4.91* 6.13*  CALCIUM 8.1* 7.8*  PHOS 5.2* 5.7*    Lipid Panel:     Component Value Date/Time   CHOL 163 01/18/2016 2052   TRIG 100 01/18/2016 2052   HDL 64 01/18/2016 2052   CHOLHDL 2.5 01/18/2016 2052   VLDL 20 01/18/2016 2052   LDLCALC 79 01/18/2016 2052   HgbA1c:  Lab Results  Component Value Date   HGBA1C 7.6* 01/18/2016   Urine Drug Screen: No results found for: LABOPIA, COCAINSCRNUR, LABBENZ, AMPHETMU, THCU, LABBARB    IMAGING  Dg Chest 2 View 01/18/2016   Probable congestive heart failure with slight pulmonary edema and small effusions. Aortic atherosclerosis.   Dg Shoulder Right 01/18/2016    No acute abnormality of the right shoulder.   Ct Head Wo Contrast 01/18/2016   1. Subacute nonhemorrhagic infarction in the right parietal lobe.  2. Multiple old lacunar infarcts as well as extensive chronic periventricular small vessel disease.  3. Progressive atrophy.     Mr Brain Wo Contrast 01/19/2016   Motion degraded examination. Acute moderate RIGHT middle cerebral artery territory infarct correlating to today's CT abnormality. No hemorrhagic conversion. Moderate to severe chronic small vessel ischemic disease and old suspected bilateral basal ganglia lacunar infarcts.   Dg Knee Complete 4 Views Left 01/18/2016   No acute abnormality.   MRA head without Contrast 09/20/2016 1. No major branch occlusion or significant proximal stenosis. 2. Moderate focal stenosis of a distal right M2 branch vessel. 3. Possible 1.5 mm right superior hypophyseal region ICA aneurysm.    PHYSICAL EXAM Elderly lady not in distress. . Afebrile. Head is nontraumatic. Neck is supple without bruit.    Cardiac exam no murmur or gallop. Lungs are clear to auscultation. Distal pulses are well felt.  Neurological Exam :  Awake alert oriented x 3 normal speech and language. Mild left lower face asymmetry. Tongue midline. No drift. Mild diminished fine finger movements on left. Orbits right over left upper extremity. Mild left grip weak.. Normal sensation .  Normal coordination.     ASSESSMENT/PLAN Ms. [name] [name] is a 82 y.o. female with history of end-stage renal disease, diabetes mellitus, hyperlipidemia, hypertension, anemia of chronic disease, coronary artery disease, and hypertrophic obstructive cardiomyopathy presenting with pulmonary edema and left hemiparesis. She did not receive IV t-PA due to unknown time of onset.  Stroke:  Non-dominant right MCA territory infarct felt to be embolic from an unknown source.   Resultant  left hemiparesis  MRI  Acute moderate RIGHT middle cerebral artery  territory infarct   MRA  Moderate focal stenosis of a distal right M2 branch vessel.  Carotid Doppler pending 2D Echo Left ventricle: The cavity size was normal. Wall thickness was  increased in a pattern of moderate LVH. There was moderate  concentric hypertrophy. Systolic function was hyperdynamic. The   estimated ejection fraction was in the range of 70% to 75LDL 79  HgbA1c pending  VTE prophylaxis - subcutaneous heparin DIET DYS 3 Room service appropriate?: Yes; Fluid consistency:: Thin  aspirin 81 mg daily prior to admission, now on aspirin 325 mg daily change to Plavix per Dr. Leonie Man  Patient counseled to be compliant with her antithrombotic medications  Ongoing aggressive stroke risk factor management  Therapy recommendations:  Pending  Disposition: Pending  Hypertension  Stable  Permissive hypertension (OK if < 220/120) but gradually normalize in 5-7 days  Hyperlipidemia  Home meds: Lipitor 20 mg daily - increased to 40 mg daily  LDL 79, goal < 70  Now on Lipitor 40 mg daily  Continue statin at discharge  Diabetes  HgbA1c pending, goal < 7.0  Controlled  Other Stroke Risk Factors  Advanced age  Old infarcts on imaging  Family hx stroke (mother)  Coronary artery disease   Other Active Problems  Anemia of chronic disease  End-stage renal disease  Hospital day # 3    I have personally examined this patient, reviewed notes, independently viewed imaging studies, participated in medical decision making and plan of care. I have made any additions or clarifications directly to the above note.  She presented with left hand weakness due to embolic right MCA branch infarct  Continue ongoing stroke w/u. Greater than  50 % time during this 15 minute visit was spent on counselling and coordination of care about stroke risk.Continue   Plavix  Antony Contras, MD Medical Director [facility] Pager: [phone] 01/21/2016 4:55 PM     To  contact Stroke Continuity provider, please refer to [email or web address] After hours, contact General Neurology

## 2016-01-21 NOTE — Progress Notes (Signed)
Pt's daughter at bedside requesting to speak with MD about patient's condition. Pt's daughter seems to be very frustrated but is not verbalizing why. Paged MD. For future reference, pt's daughter, [name], would like to be called daily by MD with updates as pt is not able to remember what is said to her. Added this to Physician's sticky notes.

## 2016-01-21 NOTE — Progress Notes (Signed)
[facility] Progress Note   Subjective: "I had a stroke, you know..." States she is feeling better. Denies pain/SOB. Moves all extremities X 4.   Objective Filed Vitals:   01/20/16 1617 01/20/16 1947 01/21/16 0456 01/21/16 0831  BP: 134/38 119/36 121/32 136/39  Pulse: 67 63 63 68  Temp: 98.6 F (37 C) 98.7 F (37.1 C) 98 F (36.7 C) 97.7 F (36.5 C)  TempSrc: Oral   Oral  Resp: 17 18 20 18   Height:      Weight:      SpO2: 100% 95% 100% 96%   Physical Exam General: Chronically ill appearing female in NAD Heart: S1,S2, RRR Lungs: Bilateral breath sounds CTA Anteriorly, few bibasilar crackles posteriorly Abdomen: soft nontender nondistended.  Extremities: No LE edema Neuro: Awake alert oriented X 3. MAE X 4 UE 4/5 LE 4/5 Dialysis Access: RUA AVF + bruit.   Additional Objective Labs: Basic Metabolic Panel:  Recent Labs Lab 01/19/16 0506 01/20/16 0641 01/21/16 0532  NA 138 136 136  K 4.0 4.2 4.5  CL 99* 96* 95*  CO2 29 27 29   GLUCOSE 145* 156* 161*  BUN 14 30* 43*  CREATININE 2.88* 4.91* 6.13*  CALCIUM 8.1* 8.1* 7.8*  PHOS 4.0 5.2* 5.7*   Liver Function Tests:  Recent Labs Lab 01/18/16 1210 01/19/16 0506 01/20/16 0641 01/21/16 0532  AST 20  --   --   --   ALT 14  --   --   --   ALKPHOS 78  --   --   --   BILITOT 0.4  --   --   --   PROT 5.8*  --   --   --   ALBUMIN 3.0* 2.7* 2.6* 2.7*    Recent Labs Lab 01/18/16 1210  LIPASE 31   CBC:  Recent Labs Lab 01/18/16 1210 01/21/16 0532  WBC 7.4 8.2  NEUTROABS 5.6  --   HGB 9.9* 9.1*  HCT 31.7* 28.5*  MCV 98.4 98.3  PLT 221 226   Blood Culture    Component Value Date/Time   SDES URINE, CATHETERIZED 12/02/2014 1528   SPECREQUEST NONE 12/02/2014 1528   CULT  12/02/2014 1528    LACTOBACILLUS SPECIES Note: Standardized susceptibility testing for this organism is not available. Performed at [address] 12/05/2014 FINAL 12/02/2014 1528    Cardiac  Enzymes:  Recent Labs Lab 01/18/16 2052 01/19/16 0506  TROPONINI 0.37* 0.42*   CBG:  Recent Labs Lab 01/20/16 0746 01/20/16 1201 01/20/16 1615 01/20/16 2020 01/21/16 0806  GLUCAP 138* 164* 206* 149* 157*   IStudies/Results: Mr Jodene Nam Head Wo Contrast  01/20/2016  CLINICAL DATA:  Left hand weakness.  Right MCA infarct on MRI. EXAM: MRA HEAD WITHOUT CONTRAST TECHNIQUE: Angiographic images of the Circle of Willis were obtained using MRA technique without intravenous contrast. COMPARISON:  None. FINDINGS: The visualized distal vertebral arteries are patent to the basilar with the right being dominant. Apparent slight narrowing of the distal left vertebral artery at the vertebrobasilar junction may be due to motion artifact. Right PICA origin is patent. Left AICA is mildly dominant. SCA origins are patent. Basilar artery is patent without stenosis. There is a patent right posterior communicating artery. PCAs are unremarkable. Internal carotid arteries are patent from skullbase to carotid termini without stenosis. There is suggestion of a 1.5 mm right ICA aneurysm projecting inferomedially in the superior hypophyseal region. ACAs and MCAs are patent without evidence of major branch occlusion or  significant proximal stenosis. There is a focal moderate stenosis of a distal right M2 branch vessel posteriorly in the sylvian fissure. IMPRESSION: 1. No major branch occlusion or significant proximal stenosis. 2. Moderate focal stenosis of a distal right M2 branch vessel. 3. Possible 1.5 mm right superior hypophyseal region ICA aneurysm. Electronically Signed   By: Logan Bores M.D.   On: 01/20/2016 11:54   Medications:   . amitriptyline  10 mg Oral QHS  . atorvastatin  40 mg Oral Daily  . calcitRIOL  0.5 mcg Oral Q M,W,F-HD  . cinacalcet  30 mg Oral Q supper  . clopidogrel  75 mg Oral Daily  . febuxostat  40 mg Oral QHS  . feeding supplement  1 Container Oral BID BM  . heparin  5,000 Units  Subcutaneous Q8H  . insulin aspart  0-5 Units Subcutaneous QHS  . insulin aspart  0-9 Units Subcutaneous TID WC  . sevelamer carbonate  2.4 g Oral TID WC  . sodium chloride flush  3 mL Intravenous Q12H      Assessment/Plan: 1. Acute mod right MCA infarct 4/28 - MRI yesterday revealed acute moderate R middle cerebral artery territory infarct ,Possible 1.5 mm right superior hypophyseal region ICA aneurysm, Moderate focal stenosis of a distal right M2 branch vessel. Per primary/neuro.  2. ESRD - MWF-[place]. For HD today.  3. Hypertension/volume - blood pressures acceptable at this point-initial concerns raised with findings of volume excess on chest x-ray with concomitant hypoxia. For HD today UFG minimal. BP controlled.  4. Anemia - HGB 9.1.  Mircera 30 q 4 weeks - last given 4/13. Monitor HGB.  5. Metabolic bone disease - Continue calcitriol- renvela powder and sensipar 30 on outpt med list. Phos 5.7 Ca 7.8 C Ca 8.84. 6. Nutrition -Albumin 2.7. dysphagia 3 diet with thin fluid consistency. 7. DM - per primary 8. DNR  [name] [name]. Brown NP-C 01/21/2016, 10:14 AM  [facility] [phone]  Pt seen, examined and agree w A/P as above.  Kelly Splinter MD [employer] pager [phone]    cell [phone] 01/21/2016, 12:29 PM

## 2016-01-21 NOTE — Consult Note (Signed)
   [facility] CM Inpatient Consult   01/21/2016  KARLIYAH KRAUSSE 1926/08/28 [id number]   Patient screened for potential [place] Management services. Patient is eligible for [place].  Patient admitted with renal disease, HX of HD from [facility].  Electronic medical record reveals patient's discharge plan is for skilled nursing care at a facility. There were no identifiable [facility] care management community post hospital  needs at this time. [facility] Care Management services not appropriate at this time. If patient's post hospital needs change please place a [facility] Care Management consult. For questions please contact:   Natividad Brood, RN BSN [facility] Liaison  [phone] business mobile phone Toll free office [phone]

## 2016-01-21 NOTE — Discharge Instructions (Signed)
Ischemic Stroke An ischemic stroke is the sudden death of brain tissue. Blood carries oxygen to all areas of your body. This type of stroke happens when your blood does not flow to your brain like normal. Your brain cannot get the oxygen it needs. This is an emergency. Symptoms of any stroke usually happen all of the sudden. You may notice them when you wake up. They can include sudden:  Weakness or loss of feeling in your face, arm, or leg, especially on one side of your body.  Confusion.  Trouble talking or understanding.  Trouble seeing.  Trouble walking or moving your arms or legs.  Dizziness.  Loss of balance or coordination.  Severe headache with no known cause. Get help as soon as any of these problems start. This is important so your doctor can treat you right away with:   Medicines that dissolve blood clots.  A device to remove a blood clot. A few hours after the start of your symptoms, your doctor may treat you with:  Oxygen.  Medicines.  A surgery to widen blood vessels. RISK FACTORS  Risk factors are things that make it more likely for you to have a stroke. These include:  High blood pressure.  High cholesterol.  Diabetes.  Heart disease.  Having a buildup of fatty deposits in the blood vessels.  Having an abnormal heart rhythm (atrial fibrillation).  Being very overweight (obese).  Smoking.  Taking birth control pills, especially if you smoke.  Not being active.  Having a diet that is high in fats, salt, and calories.  Drinking too much alcohol.  Using illegal drugs.  Being African American.  Being over the age of 67.  Having a family history of stroke.  Having a history of blood clots, stroke, warning stroke (transient ischemic attack, TIA), or heart attack.  Sickle cell disease. HOME CARE  Take all medicines exactly as told by your doctor. Understand all your medicine instructions.  If you are able to swallow, eat healthy  foods. Eat 5 or more servings of fruits and vegetables each day. Eat soft foods or pureed foods, or eat small bites of food so you do not choke.  Stay active. Try to get at least 30 minutes of activity on most or all days.  Do not smoke.  Limit or stop alcohol use.  Keep your home safe so you do not fall. Try:  Putting grab bars in the bedroom and bathroom.  Raising toilet seats.  Putting a seat in the shower.  Go to physical, occupational, and speech therapy sessions as told by your doctor.  Use a walker or a cane at all times if told to do so.  Keep all doctor visits as told. GET HELP RIGHT AWAY IF:   You have sudden weakness or loss of feeling in your face, arm, or leg, especially on one side of your body.  You are suddenly confused.  You have trouble talking or understanding.  You have sudden trouble seeing.  You have sudden trouble walking or moving your arms or legs.  You have sudden dizziness.  You lose your balance or your movements are not smooth.  You have a sudden, severe headache with no known cause.  You are partly unaware or totally unaware of what is going on around you. Any of these symptoms may be a sign of an emergency. Do not wait to see if the symptoms go away. Call for help (911 in [place] Do not drive yourself to  the hospital.   This information is not intended to replace advice given to you by your health care provider. Make sure you discuss any questions you have with your health care provider.   Document Released: 06/23/2014 Document Reviewed: 06/23/2014 Elsevier Interactive Patient Education [employer].

## 2016-01-21 NOTE — Discharge Summary (Signed)
Name: [name] [name] MRN: [id number] DOB: Jul 04, 1926 82 y.o. PCP: [name] Peer, MD  Date of Admission: 01/18/2016 10:53 AM Date of Discharge: 01/22/2016 Attending Physician: [name] Belt, MD  Discharge Diagnosis: 1. Acute R MCA territory infarct 2. Pulmonary edema secondary to fluid overload, End Stage Renal Disease on HD MWF  Discharge Medications:   Medication List    STOP taking these medications        alendronate 70 MG tablet  Commonly known as:  FOSAMAX     aspirin EC 81 MG tablet     geriatric multivitamins-minerals Elix     Glucosamine-Chondroitin 250-200 MG Caps     lansoprazole 30 MG capsule  Commonly known as:  PREVACID     lanthanum 500 MG chewable tablet  Commonly known as:  FOSRENOL      TAKE these medications        acetaminophen 500 MG tablet  Commonly known as:  TYLENOL  Take 1,000 mg by mouth every 6 (six) hours as needed (pain).     acetaminophen 325 MG tablet  Commonly known as:  TYLENOL  Take 650 mg by mouth every 6 (six) hours as needed for mild pain.     albuterol 108 (90 Base) MCG/ACT inhaler  Commonly known as:  PROVENTIL HFA;VENTOLIN HFA  Inhale 2 puffs into the lungs every 4 (four) hours as needed for wheezing or shortness of breath.     amitriptyline 10 MG tablet  Commonly known as:  ELAVIL  Take 10 mg by mouth at bedtime.     atorvastatin 40 MG tablet  Commonly known as:  LIPITOR  Take 1 tablet (40 mg total) by mouth daily.     b complex-vitamin c-folic acid 0.8 MG Tabs tablet  Take 1 tablet by mouth at bedtime.     cinacalcet 30 MG tablet  Commonly known as:  SENSIPAR  Take 30 mg by mouth daily with supper.     clopidogrel 75 MG tablet  Commonly known as:  PLAVIX  Take 1 tablet (75 mg total) by mouth daily.     docusate sodium 100 MG capsule  Commonly known as:  COLACE  Take 100 mg by mouth daily.     febuxostat 40 MG tablet  Commonly known as:  ULORIC  Take 40 mg by mouth at bedtime.     feeding  supplement (GLUCERNA SHAKE) Liqd  Take 237 mLs by mouth 2 (two) times daily between meals.     fexofenadine 180 MG tablet  Commonly known as:  ALLEGRA  Take 180 mg by mouth daily as needed for allergies or rhinitis.     fish oil-omega-3 fatty acids 1000 MG capsule  Take 1 g by mouth daily.     guaiFENesin 100 MG/5ML liquid  Commonly known as:  ROBITUSSIN  Take 200 mg by mouth every 6 (six) hours as needed for cough.     insulin detemir 100 UNIT/ML injection  Commonly known as:  LEVEMIR  Inject 0.03 mLs (3 Units total) into the skin daily.     insulin regular 100 units/mL injection  Commonly known as:  NOVOLIN R,HUMULIN R  Inject 0-0.12 mLs (0-12 Units total) into the skin 3 (three) times daily before meals. Per sliding scale: 0-150=0 units, 151-200 = 1 units, 201-250 = 2 units, 251-300 = 3 units, 301-350 = 4 units, 351-400 = 5 units, 401-450 = 6 units, > 450 CALL MD.     magnesium hydroxide 400 MG/5ML suspension  Commonly known  as:  MILK OF MAGNESIA  Take 30 mLs by mouth daily as needed for mild constipation.     nystatin powder  Generic drug:  nystatin  Apply 1 g topically every 8 (eight) hours as needed (yeastlike areas).     ondansetron 8 MG tablet  Commonly known as:  ZOFRAN  Take 8 mg by mouth 3 (three) times daily as needed for nausea or vomiting.     OxyCODONE HCl (Abuse Deter) 5 MG Taba  Commonly known as:  OXAYDO  Take 5 mg by mouth every 4 (four) hours as needed (pain).     phenol 1.4 % Liqd  Commonly known as:  CHLORASEPTIC  Use as directed 1 spray in the mouth or throat 3 (three) times daily as needed for throat irritation / pain.     polyethylene glycol packet  Commonly known as:  MIRALAX / GLYCOLAX  Take 17 g by mouth at bedtime.     polyvinyl alcohol 1.4 % ophthalmic solution  Commonly known as:  LIQUIFILM TEARS  Place 2 drops into both eyes 4 (four) times daily.     RENVELA 2.4 g Pack  Generic drug:  sevelamer carbonate  Take 2.4 g by mouth 3  (three) times daily with meals.     traMADol 50 MG tablet  Commonly known as:  ULTRAM  Take 1 tablet (50 mg total) by mouth every 6 (six) hours as needed (pain).     vitamin B-12 1000 MCG tablet  Commonly known as:  CYANOCOBALAMIN  Take 1,000 mcg by mouth daily.        Disposition and follow-up:   [name] [name] was discharged from [facility] in Stable condition.  At the hospital follow up visit please address:  1.  Acute R MCA territory infarct: On Plavix daily and Lipitor 40mg  daily. She was evaluated by SLP who recommended dysphagia 3, thin liquid diet, whole meds with liquids. PT/OT recommending SNF placement.  Pulmonary edema secondary to fluid overload, End Stage Renal Disease on HD MWF  2.  Labs / imaging needed at time of follow-up: None  3.  Pending labs/ test needing follow-up: None  Follow-up Appointments: Follow-up Information    Schedule an appointment as soon as possible for a visit with [name] Held, MD.   Specialty:  Family Medicine   Why:  Hospital follow up   Contact information:   [place] [address] [phone]       Follow up with [facility]. Schedule an appointment as soon as possible for a visit in 2 months.   Why:  Stroke follow up   Contact information:   [address]     [address] [place] [phone]      Discharge Instructions: Discharge Instructions    Call MD for:  difficulty breathing, headache or visual disturbances    Complete by:  As directed      Call MD for:  persistant dizziness or light-headedness    Complete by:  As directed      Call MD for:  persistant nausea and vomiting    Complete by:  As directed      Call MD for:  severe uncontrolled pain    Complete by:  As directed      Increase activity slowly    Complete by:  As directed            Consultations: Treatment Team:  [name] Shiley, MD Md Stroke,  MD  Procedures  Performed:  Dg Chest 2 View  01/18/2016  CLINICAL DATA:  Shortness of breath.  Low O2 saturation. EXAM: CHEST  2 VIEW COMPARISON:  10/10/2015 and 11/14/2013 and 12/02/2014 FINDINGS: There is cardiomegaly with slight pulmonary vascular congestion. There is diffuse accentuation of the interstitial markings which I suspect represents pulmonary edema. Dense calcification in the mitral valve annulus. No acute bone abnormality. Tiny bilateral effusions. Extensive calcification in the aorta. IMPRESSION: Probable congestive heart failure with slight pulmonary edema and small effusions. Aortic atherosclerosis. Electronically Signed   By: Lorriane Shire M.D.   On: 01/18/2016 12:09   Dg Shoulder Right  01/18/2016  CLINICAL DATA:  Right shoulder pain. EXAM: RIGHT SHOULDER - 2+ VIEW COMPARISON:  None. FINDINGS: Diffuse osteopenia. No fracture or dislocation. No abnormal soft tissue calcification. Minimal degenerative changes at the acromioclavicular joint. Note is made of accentuation of the interstitial markings in the right lung, probably chronic. IMPRESSION: No acute abnormality of the right shoulder. Electronically Signed   By: Lorriane Shire M.D.   On: 01/18/2016 12:05   Ct Head Wo Contrast  01/18/2016  CLINICAL DATA:  Left arm and hand weakness for 3-4 days. EXAM: CT HEAD WITHOUT CONTRAST TECHNIQUE: Contiguous axial images were obtained from the base of the skull through the vertex without intravenous contrast. COMPARISON:  10/07/2013 FINDINGS: There is a subacute nonhemorrhagic infarction in the right parietal lobe with effacement of the adjacent cortical sulci. New diffuse periventricular white matter lucency. Old lacunar infarcts in the basal ganglia and right side of the pons but new since the study of 2015. Progressive diffuse cerebral cortical atrophy with secondary ventricular dilatation. No acute hemorrhage or mass lesion. Bones are normal except for slight mucosal thickening on the left side of the  sphenoid sinus. IMPRESSION: 1. Subacute nonhemorrhagic infarction in the right parietal lobe. 2. Multiple old lacunar infarcts as well as extensive chronic periventricular small vessel disease. 3. Progressive atrophy. Electronically Signed   By: Lorriane Shire M.D.   On: 01/18/2016 15:28   Mr Jodene Nam Head Wo Contrast  01/20/2016  CLINICAL DATA:  Left hand weakness.  Right MCA infarct on MRI. EXAM: MRA HEAD WITHOUT CONTRAST TECHNIQUE: Angiographic images of the Circle of Willis were obtained using MRA technique without intravenous contrast. COMPARISON:  None. FINDINGS: The visualized distal vertebral arteries are patent to the basilar with the right being dominant. Apparent slight narrowing of the distal left vertebral artery at the vertebrobasilar junction may be due to motion artifact. Right PICA origin is patent. Left AICA is mildly dominant. SCA origins are patent. Basilar artery is patent without stenosis. There is a patent right posterior communicating artery. PCAs are unremarkable. Internal carotid arteries are patent from skullbase to carotid termini without stenosis. There is suggestion of a 1.5 mm right ICA aneurysm projecting inferomedially in the superior hypophyseal region. ACAs and MCAs are patent without evidence of major branch occlusion or significant proximal stenosis. There is a focal moderate stenosis of a distal right M2 branch vessel posteriorly in the sylvian fissure. IMPRESSION: 1. No major branch occlusion or significant proximal stenosis. 2. Moderate focal stenosis of a distal right M2 branch vessel. 3. Possible 1.5 mm right superior hypophyseal region ICA aneurysm. Electronically Signed   By: Logan Bores M.D.   On: 01/20/2016 11:54   Mr Brain Wo Contrast  01/19/2016  CLINICAL DATA:  LEFT upper extremity weakness, assess for stroke. History of end-stage renal disease on dialysis, diabetes, hypertension, hypercholesterolemia. EXAM: MRI HEAD WITHOUT  CONTRAST TECHNIQUE: Multiplanar,  multiecho pulse sequences of the brain and surrounding structures were obtained without intravenous contrast. COMPARISON:  CT head January 18, 2016 FINDINGS: Multiple sequences are moderately motion degraded. INTRACRANIAL CONTENTS: Confluent reduced diffusion within RIGHT posterior insula/operculum and, frontoparietal lobes measuring up to 4 x 7.3 cm, extending to the RIGHT periventricular white matter. Area of reduced diffusion demonstrates low ADC values in minimal FLAIR T2 hyperintense signal. No susceptibility artifact to suggest hemorrhage. Ventricles and sulci are normal for patient's age. Old small suspected bilateral basal ganglia lacunar infarcts with prominent thalamus perivascular spaces. Patchy to confluent supratentorial and pontine white matter FLAIR T2 hyperintensities. Old bilateral small cerebellar infarcts. No midline shift, mass effect or mass is though, motion degrades sensitivity. Normal major intracranial vascular flow voids present at skullbase. ORBITS: The included ocular globes and orbital contents are non-suspicious. Status post bilateral ocular lens implants. SINUSES: Less sphenoid sinus air-fluid level. Mastoid air cells are well aerated. SKULL/SOFT TISSUES: No abnormal sellar expansion. No suspicious calvarial bone marrow signal. Craniocervical junction maintained. IMPRESSION: Motion degraded examination. Acute moderate RIGHT middle cerebral artery territory infarct correlating to today's CT abnormality. No hemorrhagic conversion. Moderate to severe chronic small vessel ischemic disease and old suspected bilateral basal ganglia lacunar infarcts. Electronically Signed   By: Elon Alas M.D.   On: 01/19/2016 00:12   Dg Knee Complete 4 Views Left  01/18/2016  CLINICAL DATA:  Left knee pain. EXAM: LEFT KNEE - COMPLETE 4+ VIEW COMPARISON:  04/30/2005 FINDINGS: The components of the total knee prosthesis appear in good position with no evidence of loosening. There is no fracture or  dislocation or joint effusion. Diffuse osteopenia. IMPRESSION: No acute abnormality. Electronically Signed   By: Lorriane Shire M.D.   On: 01/18/2016 12:03    2D Echo:  Study Conclusions  - Left ventricle: The cavity size was normal. Wall thickness was  increased in a pattern of moderate LVH. There was moderate  concentric hypertrophy. Systolic function was hyperdynamic. The  estimated ejection fraction was in the range of 70% to 75%. There  was dynamic obstruction at restin the mid cavity, with a peak  velocity of 200 cm/sec and a peak gradient of 16 mm Hg. Wall  motion was normal; there were no regional wall motion  abnormalities. - Mitral valve: Moderately to severely calcified annulus. There was  mild systolic anterior motion of the chordal structures. The  findings are consistent with trivial stenosis. There was mild to  moderate regurgitation directed centrally. Valve area by pressure  half-time: 2.34 cm^2. - Left atrium: The atrium was severely dilated.  Transthoracic echocardiography. M-mode, complete 2D, spectral Doppler, and color Doppler. Birthdate: Patient birthdate: 10-Aug-1926. Age: Patient is 82 yr old. Sex: Gender: female. BMI: 24.7 kg/m^2. Blood pressure: 136/39 Patient status: Inpatient. Study date: Study date: 01/21/2016. Study time: 10:12 AM. Location: Bedside.   Admission HPI: 82 year old woman with DM2, HTN, HLD, ESRD on HD, CAD, HOCM, and GERD presenting to the hospital from her assisted living facility because her "butt felt heavy" early this morning. Remainder of history was provided by daughter at bedside. Daughter states patient is having pain in her R shoulder, L knee, and her L hand is "limp." States patient has has surgeries done on both her shoulders and both her knees in the past. States she was told patient could not use her commode at the nursing home this morning because she felt weak; she could not use her arms to hold on  to the rails  before sitting on the commode. Normally she uses a wheelchair to ambulate. States she visited the patient yesterday and she was in her usual state of health at that time. Patient has also been complaining of nausea since this morning and coughed up some phlegm. She has been having decreased appetite since she moved into the ALF several years ago and daughter believes she lost 60 lbs in the past 2 years. She is not aware of the patient having any fevers, chills, or cough in the past few days. Although, she has been complaining of "twitching" back pain on occasion.   Patient reports having chest pain a few nights ago but could not give [place] any more details. States she uses 1 pillow to sleep at night. Daughter denies noticing any lower extremity edema. Patient denies any vision changes or headaches. Denies any history of seizures.   Hospital Course by problem list:  1. Acute R MCA territory infarct: Hemi-neglect on the left noted. Difficulty with coordination when using her LUE and decreased strength in both LUE and LLE. CT of head with subacute non-hemorrhagic infarction in the R parietal lobe. MRI brain with an acute moderate R middle cerebral artery territory infarct. MRA head with no major branch occlusion or significant proximal stenosis. Moderate focal stenosis of a distal right M2 branch vessel. Echo results as noted above. Carotid dopplers unremarkble. Lipid panel with cholesterol 163, HDL 64, and LDL 79. Hgb A1c 7.6%. Her home aspirin was discontinued and she was started on Plavix daily. She was continued on Lipitor 40mg  daily. She was evaluated by SLP who recommended dysphagia 3, thin liquid diet, whole meds with liquids. PT/OT recommending SNF placement.  2. Pulmonary edema secondary to fluid overload, End Stage Renal Disease on HD MWF: Initially requiring 2L supplemental O2. CXR demonstrated CHF with slight pulmonary edema and small effusions. Troponin 0.4 but records show her troponin  is chronically elevated and was similar in 09/2015. She was seen in consultation by nephrology and received HD while she was hospitalized. Ultrafiltration limited by intradialytic hypotension. She will continue outpatient HD on discharge.  Discharge Vitals:   BP 156/39 mmHg  Pulse 89  Temp(Src) 98.3 F (36.8 C) (Oral)  Resp 17  Ht 5\' 4"  (1.626 m)  Wt 144 lb 13.5 oz (65.7 kg)  BMI 24.85 kg/m2  SpO2 100%  LMP  (LMP Unknown)  Discharge Labs:  Results for orders placed or performed during the hospital encounter of 01/18/16 (from the past 24 hour(s))  Glucose, capillary     Status: Abnormal   Collection Time: 01/21/16  9:22 PM  Result Value Ref Range   Glucose-Capillary 122 (H) 65 - 99 mg/dL  Glucose, capillary     Status: Abnormal   Collection Time: 01/22/16  8:13 AM  Result Value Ref Range   Glucose-Capillary 127 (H) 65 - 99 mg/dL  Glucose, capillary     Status: Abnormal   Collection Time: 01/22/16 11:35 AM  Result Value Ref Range   Glucose-Capillary 169 (H) 65 - 99 mg/dL    Signed: Milagros Loll, MD 01/22/2016, 3:14 PM    Services Ordered on Discharge: None Equipment Ordered on Discharge: None

## 2016-01-21 NOTE — Progress Notes (Addendum)
Patient up in chair. While the NT and assisting RN were transferring patient from chair to bed, pt acquired a skin tear on L forearm above IV site. Per NT, pt's arm became temporarily wedged between her body and the arm of the chair. Vaseline gauze and curlex applied. Will continue to monitor.

## 2016-01-22 ENCOUNTER — Inpatient Hospital Stay (HOSPITAL_COMMUNITY): Payer: Medicare Other

## 2016-01-22 DIAGNOSIS — R0902 Hypoxemia: Secondary | ICD-10-CM | POA: Insufficient documentation

## 2016-01-22 DIAGNOSIS — R29898 Other symptoms and signs involving the musculoskeletal system: Secondary | ICD-10-CM | POA: Insufficient documentation

## 2016-01-22 DIAGNOSIS — I63131 Cerebral infarction due to embolism of right carotid artery: Secondary | ICD-10-CM

## 2016-01-22 DIAGNOSIS — J810 Acute pulmonary edema: Secondary | ICD-10-CM | POA: Insufficient documentation

## 2016-01-22 DIAGNOSIS — M6289 Other specified disorders of muscle: Secondary | ICD-10-CM

## 2016-01-22 LAB — GLUCOSE, CAPILLARY
GLUCOSE-CAPILLARY: 122 mg/dL — AB (ref 65–99)
Glucose-Capillary: 127 mg/dL — ABNORMAL HIGH (ref 65–99)
Glucose-Capillary: 169 mg/dL — ABNORMAL HIGH (ref 65–99)
Glucose-Capillary: 157 mg/dL — ABNORMAL HIGH (ref 65–99)

## 2016-01-22 MED ORDER — OXYCODONE HCL 5 MG PO TABA: 5.0000 mg | ORAL_TABLET | ORAL | Status: AC | PRN

## 2016-01-22 MED ORDER — TRAMADOL HCL 50 MG PO TABS: 50.0000 mg | ORAL_TABLET | Freq: Four times a day (QID) | ORAL | Status: AC | PRN

## 2016-01-22 NOTE — Clinical Social Work Placement (Signed)
   CLINICAL SOCIAL WORK PLACEMENT  NOTE 01/22/16 - DISCHARGED TO [facility] [place], [place]   Date:  01/22/2016  Patient Details  Name: [name] [name] MRN: [id number] Date of Birth: 1925/10/17  Clinical Social Work is seeking post-discharge placement for this patient at the [facility] level of care (*CSW will initial, date and re-position this form in  chart as items are completed):  No   Patient/family provided with [facility] Work Department's list of facilities offering this level of care within the geographic area requested by the patient (or if unable, by the patient's family).  Yes   Patient/family informed of their freedom to choose among providers that offer the needed level of care, that participate in Medicare, Medicaid or managed care program needed by the patient, have an available bed and are willing to accept the patient.  No   Patient/family informed of [facility]'s ownership interest in [facility] and [facility], as well as of the fact that they are under no obligation to receive care at these facilities.  PASRR submitted to EDS on       PASRR number received on       Existing PASRR number confirmed on 01/22/16     FL2 transmitted to all facilities in geographic area requested by pt/family on 01/22/16     FL2 transmitted to all facilities within larger geographic area on       Patient informed that his/her managed care company has contracts with or will negotiate with certain facilities, including the following:        Yes   Patient/family informed of bed offers received.  Patient chooses bed at [facility] recommends and patient chooses bed at      Patient to be transferred to [facility] on 01/22/16.  Patient to be transferred to facility by ambulance     Patient family notified on 01/22/16 of transfer.  Name of family member notified:  Katheran Awe  [phone])     PHYSICIAN       Additional Comment:    _______________________________________________ Sable Feil, LCSW 01/22/2016, 5:10 PM

## 2016-01-22 NOTE — Progress Notes (Signed)
[facility] Progress Note  Subjective: "I think I'm a little stronger today". C/O pain in R knee, requesting pain meds.     Objective Filed Vitals:   01/21/16 1905 01/21/16 2046 01/21/16 2327 01/22/16 0458  BP: 133/49 144/29  156/39  Pulse: 89 83  89  Temp: 97.6 F (36.4 C) 99 F (37.2 C)  98.3 F (36.8 C)  TempSrc: Oral   Oral  Resp: 20 18  17   Height:      Weight: 65.7 kg (144 lb 13.5 oz)  65.7 kg (144 lb 13.5 oz)   SpO2: 97% 97%  100%   Physical Exam General: Pleasant in NAD Heart: S1,S2, RRR Lungs: Bilateral breath sounds CTA A/P Abdomen: soft nontender nondistended with active BS Extremities: No LE edema Neuro: Awake alert oriented X 3. MAE X 4 UE 5/5 LE 5/5 face symmetrical (improved from yesterday) Dialysis Access: RUA AVF + bruit.    Additional Objective Labs: Basic Metabolic Panel:  Recent Labs Lab 01/19/16 0506 01/20/16 0641 01/21/16 0532  NA 138 136 136  K 4.0 4.2 4.5  CL 99* 96* 95*  CO2 29 27 29   GLUCOSE 145* 156* 161*  BUN 14 30* 43*  CREATININE 2.88* 4.91* 6.13*  CALCIUM 8.1* 8.1* 7.8*  PHOS 4.0 5.2* 5.7*   Liver Function Tests:  Recent Labs Lab 01/18/16 1210 01/19/16 0506 01/20/16 0641 01/21/16 0532  AST 20  --   --   --   ALT 14  --   --   --   ALKPHOS 78  --   --   --   BILITOT 0.4  --   --   --   PROT 5.8*  --   --   --   ALBUMIN 3.0* 2.7* 2.6* 2.7*    Recent Labs Lab 01/18/16 1210  LIPASE 31   CBC:  Recent Labs Lab 01/18/16 1210 01/21/16 0532  WBC 7.4 8.2  NEUTROABS 5.6  --   HGB 9.9* 9.1*  HCT 31.7* 28.5*  MCV 98.4 98.3  PLT 221 226   Blood Culture    Component Value Date/Time   SDES URINE, CATHETERIZED 12/02/2014 1528   SPECREQUEST NONE 12/02/2014 1528   CULT  12/02/2014 1528    LACTOBACILLUS SPECIES Note: Standardized susceptibility testing for this organism is not available. Performed at [facility] 12/05/2014 FINAL 12/02/2014 1528    Cardiac  Enzymes:  Recent Labs Lab 01/18/16 2052 01/19/16 0506  TROPONINI 0.37* 0.42*   CBG:  Recent Labs Lab 01/20/16 2020 01/21/16 0806 01/21/16 1136 01/21/16 2122 01/22/16 0813  GLUCAP 149* 157* 185* 122* 127*   Iron Studies: No results for input(s): IRON, TIBC, TRANSFERRIN, FERRITIN in the last 72 hours. @lablastinr3 @ Studies/Results: Mr Virgel Paling Wo Contrast  01/20/2016  CLINICAL DATA:  Left hand weakness.  Right MCA infarct on MRI. EXAM: MRA HEAD WITHOUT CONTRAST TECHNIQUE: Angiographic images of the Circle of Willis were obtained using MRA technique without intravenous contrast. COMPARISON:  None. FINDINGS: The visualized distal vertebral arteries are patent to the basilar with the right being dominant. Apparent slight narrowing of the distal left vertebral artery at the vertebrobasilar junction may be due to motion artifact. Right PICA origin is patent. Left AICA is mildly dominant. SCA origins are patent. Basilar artery is patent without stenosis. There is a patent right posterior communicating artery. PCAs are unremarkable. Internal carotid arteries are patent from skullbase to carotid termini without stenosis. There is suggestion of a 1.5 mm  right ICA aneurysm projecting inferomedially in the superior hypophyseal region. ACAs and MCAs are patent without evidence of major branch occlusion or significant proximal stenosis. There is a focal moderate stenosis of a distal right M2 branch vessel posteriorly in the sylvian fissure. IMPRESSION: 1. No major branch occlusion or significant proximal stenosis. 2. Moderate focal stenosis of a distal right M2 branch vessel. 3. Possible 1.5 mm right superior hypophyseal region ICA aneurysm. Electronically Signed   By: Logan Bores M.D.   On: 01/20/2016 11:54   Medications:   . amitriptyline  10 mg Oral QHS  . atorvastatin  40 mg Oral Daily  . calcitRIOL  0.5 mcg Oral Q M,W,F-HD  . cinacalcet  30 mg Oral Q supper  . clopidogrel  75 mg Oral Daily  .  febuxostat  40 mg Oral QHS  . feeding supplement  1 Container Oral BID BM  . heparin  5,000 Units Subcutaneous Q8H  . insulin aspart  0-5 Units Subcutaneous QHS  . insulin aspart  0-9 Units Subcutaneous TID WC  . sevelamer carbonate  2.4 g Oral TID WC  . sodium chloride flush  3 mL Intravenous Q12H   Dialysis Orders: [facility] MWF 3 hrs 30 min 160NRe Optiflux  BFR 400, DFR Autoflow 1.5,  EDW 66.5 (kg),  2.0 K, 2.25 Ca UFR Profile: Profile 4, Sodium Model: Linear RFA AV Fistula   Assessment/Plan: 1. Acute mod right MCA infarct 4/28 - MRI yesterday revealed acute moderate R middle cerebral artery territory infarct ,Possible 1.5 mm right superior hypophyseal region ICA aneurysm, Moderate focal stenosis of a distal right M2 branch vessel. Per primary/neuro. PT/OT consulted. Vascular U/S done 05/01 suggest 1-39% carotid stenosis bilaterally.  2. ESRD - MWF-[place]. For HD tomorrow on schedule.  3. Hypertension/volume - Hemodynamically stable-BP controlled. HD 01/21/16 pre wt 67.3 kg Net UF 1000 Post wt 65.7 kg. Is slightly under OP EDW. Will attempt UFG .5-1 liter tomorrow.  4. Anemia - HGB 9.1. Mircera 30 q 4 weeks - last given 4/13. Monitor HGB. May need repeat ESA dose.  5. Metabolic bone disease - Continue calcitriol- renvela powder and sensipar 30 on outpt med list. Phos 5.7 Ca 7.8 C Ca 8.84. 6. Nutrition -Albumin 2.7. dysphagia 3 diet with thin fluid consistency. 7. DM - per primary 8. DNR 9. Chronic debility - already WC-bound prior to this new stroke, and I believe may be a hoyer transfer at baseline at outpatient HD. Prognosis guarded long-term 10. Disposition: Recommendations for SNF have been made.   Rita H. Brown NP-C 01/22/2016, 10:30 AM   [facility] [phone]  Pt seen, examined, agree w assess/plan as above with additions as indicated.  Kelly Splinter MD [employer] pager [phone]    cell [phone] 01/22/2016, 1:46 PM

## 2016-01-22 NOTE — NC FL2 (Signed)
[place] LEVEL OF CARE SCREENING TOOL     IDENTIFICATION  Patient Name: [name] [name] Birthdate: 12/03/1925 Sex: female Admission Date (Current Location): 01/18/2016  [facility] and [place] Number:  Herbalist and Address:  The . [facility] [place] [address], [place], [place] [address]      Provider Number: [id number]  Attending Physician Name and Address:  Annia Belt, MD  Relative Name and Phone Number:  Katheran Awe - daughter. Phone number [phone] (mobile)    Current Level of Care: Hospital Recommended Level of Care: [place] (Patient from [place], however will discharge to a skilled facility for rehab prior to returning to ALF.) Prior Approval Number:    Date Approved/Denied:   PASRR Number: [id number] (Eff. 02/01/07)  Discharge Plan: SNF    Current Diagnoses: Patient Active Problem List   Diagnosis Date Noted  . CVA (cerebral infarction)   . ESRD on dialysis ([place])   . Diabetes mellitus, insulin dependent (IDDM), controlled ([place])   . HTN (hypertension), benign   . Primary gout   . Pulmonary edema 01/18/2016  . Volume overload 01/18/2016  . Hypervolemia 10/10/2015  . Chronic kidney disease   . Constipated   . Back pain   . Fall   . Compression fracture of L5 lumbar vertebra ([place]) 12/02/2014  . Compression fracture 12/02/2014  . Aspiration pneumonia ([place]) 10/14/2013  . Compression fracture of L2 lumbar vertebra ([place]) 10/14/2013  . Acute diastolic CHF (congestive heart failure) ([place]) 10/13/2013  . Elevated troponin 10/13/2013  . Other dysphagia 10/11/2013  . Dysphagia, unspecified(787.20) 10/10/2013  . Malnutrition of moderate degree ([place]) 10/10/2013  . Acute respiratory failure ([place]) 10/08/2013  . Fluid overload 10/08/2013  . Hypertrophic obstructive cardiomyopathy-peak gradient 51 mmHg Jan 2015 10/05/2013  . UTI (urinary tract infection) 10/04/2013  .  Hyperkalemia 10/04/2013  . Hypoglycemia 10/02/2013  . End-stage renal disease (ESRD) ([place]) 10/01/2013  . Rhabdomyolysis 10/01/2013  . End stage renal disease ([place]) 07/06/2012  . Murmur 05/27/2011  . OBESITY, UNSPECIFIED 03/20/2009  . ABNORMAL ELECTROCARDIOGRAM 03/20/2009  . Type 2 diabetes, uncontrolled, with renal manifestation ([place]) 03/19/2009  . HYPERCHOLESTEROLEMIA 03/19/2009  . Deficiency anemia 03/19/2009  . Essential hypertension 03/19/2009  . RENAL INSUFFICIENCY 03/19/2009    Orientation RESPIRATION BLADDER Height & Weight     Self, Situation  Normal Continent Weight: 144 lb 13.5 oz (65.7 kg) Height:  5\' 4"  (162.6 cm)  BEHAVIORAL SYMPTOMS/MOOD NEUROLOGICAL BOWEL NUTRITION STATUS      Continent Diet (DYS 3 - thin liquid)  AMBULATORY STATUS COMMUNICATION OF NEEDS Skin   Extensive Assist (Patient uses w/c for mobility due to multiple falls) Verbally Other (Comment) (Moisture associated skin damage to sacrum - treated with barrier cream..)                       Personal Care Assistance Level of Assistance  Bathing, Feeding, Dressing Bathing Assistance: Maximum assistance Feeding assistance: Limited assistance Dressing Assistance: Maximum assistance     Functional Limitations Info  Sight, Hearing, Speech Sight Info: Impaired Hearing Info: Adequate Speech Info: Adequate    SPECIAL CARE FACTORS FREQUENCY  PT (By licensed PT), OT (By licensed OT)     PT Frequency: Evaluated 4/30 - a minimum of 3X per week recommended OT Frequency: Evaluated 5/1 - a minimum of 3X per week recommended            Contractures Contractures Info: Not present  Additional Factors Info  Code Status, Insulin Sliding Scale, Allergies Code Status Info: DNR Allergies Info: No known allergies   Insulin Sliding Scale Info: 0-9 units 3X per day with meals; 0-5 units at bedtime.       Current Medications (01/22/2016):  This is the current hospital active medication list Current  Facility-Administered Medications  Medication Dose Route Frequency Provider Last Rate Last Dose  . acetaminophen (TYLENOL) tablet 650 mg  650 mg Oral Q6H PRN Zada Finders, MD   650 mg at 01/20/16 1445  . amitriptyline (ELAVIL) tablet 10 mg  10 mg Oral QHS Juliet Rude, MD   10 mg at 01/21/16 2216  . atorvastatin (LIPITOR) tablet 40 mg  40 mg Oral Daily Juliet Rude, MD   40 mg at 01/22/16 1010  . calcitRIOL (ROCALTROL) capsule 0.5 mcg  0.5 mcg Oral Q M,W,F-HD Alric Seton, PA-C      . cinacalcet (SENSIPAR) tablet 30 mg  30 mg Oral Q supper Juliet Rude, MD   30 mg at 01/20/16 1628  . clopidogrel (PLAVIX) tablet 75 mg  75 mg Oral Daily David L Rinehuls, PA-C   75 mg at 01/22/16 1010  . febuxostat (ULORIC) tablet 40 mg  40 mg Oral QHS Juliet Rude, MD   40 mg at 01/21/16 2216  . feeding supplement (BOOST / RESOURCE BREEZE) liquid 1 Container  1 Container Oral BID BM Nischal Narendra, MD   1 Container at 01/22/16 1009  . heparin injection 5,000 Units  5,000 Units Subcutaneous Q8H Juliet Rude, MD   5,000 Units at 01/22/16 0602  . insulin aspart (novoLOG) injection 0-5 Units  0-5 Units Subcutaneous QHS Juliet Rude, MD   0 Units at 01/18/16 2340  . insulin aspart (novoLOG) injection 0-9 Units  0-9 Units Subcutaneous TID WC Juliet Rude, MD   1 Units at 01/22/16 1010  . oxyCODONE (Oxy IR/ROXICODONE) immediate release tablet 5 mg  5 mg Oral Q4H PRN Loleta Chance, MD   5 mg at 01/22/16 1017  . sevelamer carbonate (RENVELA) powder PACK 2.4 g  2.4 g Oral TID WC Carly J Rivet, MD   2.4 g at 01/22/16 1011  . sodium chloride flush (NS) 0.9 % injection 3 mL  3 mL Intravenous Q12H Juliet Rude, MD   3 mL at 01/22/16 1014     Discharge Medications: Please see discharge summary for a list of discharge medications.  Relevant Imaging Results:  Relevant Lab Results:   Additional Information ss#[id number]. [place] - chair time 11:00 am  Sable Feil,  LCSW

## 2016-01-22 NOTE — Progress Notes (Signed)
Subjective: No acute events overnight. Denies SOB or CP. Some generalized weakness. Said her HD session went well but was long.  Objective: Vital signs in last 24 hours: Filed Vitals:   01/21/16 1905 01/21/16 2046 01/21/16 2327 01/22/16 0458  BP: 133/49 144/29  156/39  Pulse: 89 83  89  Temp: 97.6 F (36.4 C) 99 F (37.2 C)  98.3 F (36.8 C)  TempSrc: Oral   Oral  Resp: 20 18  17   Height:      Weight: 144 lb 13.5 oz (65.7 kg)  144 lb 13.5 oz (65.7 kg)   SpO2: 97% 97%  100%   Weight change:   Intake/Output Summary (Last 24 hours) at 01/22/16 [id number] Last data filed at 01/22/16 0600  Gross per 24 hour  Intake    120 ml  Output   1120 ml  Net  -1000 ml   General Apperance: NAD HEENT: Normocephalic, atraumatic, anicteric sclera Neck: Supple, trachea midline Lungs: Clear to auscultation bilaterally. Heart: Regular rate and rhythm Abdomen: Soft, nondistended Extremities: Warm and well perfused, no edema Skin: No rashes or lesions Neurologic: Alert and interactive. 4/5 LUE and LLE.  Lab Results: Basic Metabolic Panel:  Recent Labs Lab 01/20/16 0641 01/21/16 0532  NA 136 136  K 4.2 4.5  CL 96* 95*  CO2 27 29  GLUCOSE 156* 161*  BUN 30* 43*  CREATININE 4.91* 6.13*  CALCIUM 8.1* 7.8*  PHOS 5.2* 5.7*   Liver Function Tests:  Recent Labs Lab 01/18/16 1210  01/20/16 0641 01/21/16 0532  AST 20  --   --   --   ALT 14  --   --   --   ALKPHOS 78  --   --   --   BILITOT 0.4  --   --   --   PROT 5.8*  --   --   --   ALBUMIN 3.0*  < > 2.6* 2.7*  < > = values in this interval not displayed.  Recent Labs Lab 01/18/16 1210  LIPASE 31    CBC:  Recent Labs Lab 01/18/16 1210 01/21/16 0532  WBC 7.4 8.2  NEUTROABS 5.6  --   HGB 9.9* 9.1*  HCT 31.7* 28.5*  MCV 98.4 98.3  PLT 221 226   Cardiac Enzymes:  Recent Labs Lab 01/18/16 2052 01/19/16 0506  TROPONINI 0.37* 0.42*   CBG:  Recent Labs Lab 01/20/16 1201 01/20/16 1615 01/20/16 2020  01/21/16 0806 01/21/16 1136 01/21/16 2122  GLUCAP 164* 206* 149* 157* 185* 122*    Fasting Lipid Panel:  Recent Labs Lab 01/18/16 2052  CHOL 163  HDL 64  LDLCALC 79  TRIG 100  CHOLHDL 2.5   Studies/Results: Mr [facility] Contrast  01/20/2016  CLINICAL DATA:  Left hand weakness.  Right MCA infarct on MRI. EXAM: MRA HEAD WITHOUT CONTRAST TECHNIQUE: Angiographic images of the Circle of Willis were obtained using MRA technique without intravenous contrast. COMPARISON:  None. FINDINGS: The visualized distal vertebral arteries are patent to the basilar with the right being dominant. Apparent slight narrowing of the distal left vertebral artery at the vertebrobasilar junction may be due to motion artifact. Right PICA origin is patent. Left AICA is mildly dominant. SCA origins are patent. Basilar artery is patent without stenosis. There is a patent right posterior communicating artery. PCAs are unremarkable. Internal carotid arteries are patent from skullbase to carotid termini without stenosis. There is suggestion of a 1.5 mm right ICA aneurysm projecting inferomedially in the  superior hypophyseal region. ACAs and MCAs are patent without evidence of major branch occlusion or significant proximal stenosis. There is a focal moderate stenosis of a distal right M2 branch vessel posteriorly in the sylvian fissure. IMPRESSION: 1. No major branch occlusion or significant proximal stenosis. 2. Moderate focal stenosis of a distal right M2 branch vessel. 3. Possible 1.5 mm right superior hypophyseal region ICA aneurysm. Electronically Signed   By: Logan Bores M.D.   On: 01/20/2016 11:54   Medications: I have reviewed the patient's current medications. Scheduled Meds: . amitriptyline  10 mg Oral QHS  . atorvastatin  40 mg Oral Daily  . calcitRIOL  0.5 mcg Oral Q M,W,F-HD  . cinacalcet  30 mg Oral Q supper  . clopidogrel  75 mg Oral Daily  . febuxostat  40 mg Oral QHS  . feeding supplement  1 Container  Oral BID BM  . heparin  5,000 Units Subcutaneous Q8H  . insulin aspart  0-5 Units Subcutaneous QHS  . insulin aspart  0-9 Units Subcutaneous TID WC  . sevelamer carbonate  2.4 g Oral TID WC  . sodium chloride flush  3 mL Intravenous Q12H   Continuous Infusions:  PRN Meds:.acetaminophen, oxyCODONE Assessment/Plan: 82 year old woman with history of ESRD on HD, HTN, DM, CAD presenting with nausea and weakness found to have L hemineglect and L sided weakness. CT head with subacute infarct.   Acute R MCA territory infarct: CT of head with subacute non-hemorrhagic infarction in the R parietal lobe. MRI brain with an acute moderate R middle cerebral artery territory infarct. MRA head with no major branch occlusion or significant proximal stenosis. Moderate focal stenosis of a distal right M2 branch vessel. Lipid panel with cholesterol 163, HDL 64, and LDL 79. Hgb A1c 7.6. Echo with LV EF 70-75%, moderate LVH, no regional wall motion abn. -Neurology following, appreciate recommendations -Plavix daily -Lipitor 40 mg daily  -PT/OT recommending SNF -Carotid dopplers  Pulmonary edema, ESRD on HD:  -Nephrology following -HD per nephrology   DM: SSI  Depression: Continue home amitriptyline   CAD: Aspirin daily   Gout: Continue home febuxostat  VTE ppx: subcutaneous heparin   Code: DNR  Dispo: Awaiting SNF placement.    The patient does have a current PCP Myer Peer, MD) and does not need an [facility] hospital follow-up appointment after discharge.  The patient does not know have transportation limitations that hinder transportation to clinic appointments.  .Services Needed at time of discharge: Y = Yes, Blank = No PT:   OT:   RN:   Equipment:   Other:     LOS: 4 days   Milagros Loll, MD 01/22/2016, 8:22 AM

## 2016-01-22 NOTE — Care Management Important Message (Signed)
Important Message  Patient Details  Name: [name] [name] MRN: [id number] Date of Birth: 01/08/1926   Medicare Important Message Given:  Yes    Joey Hudock P [place] 01/22/2016, 1:32 PM

## 2016-01-22 NOTE — Progress Notes (Addendum)
*  PRELIMINARY RESULTS* Vascular Ultrasound Carotid Duplex (Doppler) has been completed.  There is evidence of 1-39% internal carotid artery stenosis bilaterally. Vertebral arteries are patent with antegrade flow.  01/22/2016 9:25 AM Maudry Mayhew, RVT, RDCS, RDMS

## 2016-01-22 NOTE — Progress Notes (Signed)
STROKE TEAM PROGRESS NOTE   HISTORY OF PRESENT ILLNESS [name] [name] is a 82 y.o. female with a history of DM, ESRD who presented with pulmonary edema and left hand weakness. It is unclear when it started. It was noted in the ED that her hand felt limp and a CT showed subacute infarct. The MRI confirmed an acute posterior div MCA infarct. She continues to have left sided weakness  LKW: unclear tpa given?: no, unclear time of onset.    SUBJECTIVE (INTERVAL HISTORY) Patient is stable . No new complaints today.Carotid dopplers show no significant stenosis.Plans for dc to snf today  OBJECTIVE Temp:  [97.6 F (36.4 C)-99 F (37.2 C)] 98.3 F (36.8 C) (05/02 0458) Pulse Rate:  [82-89] 89 (05/02 0458) Cardiac Rhythm:  [-] Heart block;Normal sinus rhythm (05/02 0740) Resp:  [17-20] 17 (05/02 0458) BP: (114-156)/(29-49) 156/39 mmHg (05/02 0458) SpO2:  [97 %-100 %] 100 % (05/02 0458) Weight:  [144 lb 13.5 oz (65.7 kg)] 144 lb 13.5 oz (65.7 kg) (05/01 2327)  CBC:   Recent Labs Lab 01/18/16 1210 01/21/16 0532  WBC 7.4 8.2  NEUTROABS 5.6  --   HGB 9.9* 9.1*  HCT 31.7* 28.5*  MCV 98.4 98.3  PLT 221 [id number]    Basic Metabolic Panel:   Recent Labs Lab 01/20/16 0641 01/21/16 0532  NA 136 136  K 4.2 4.5  CL 96* 95*  CO2 27 29  GLUCOSE 156* 161*  BUN 30* 43*  CREATININE 4.91* 6.13*  CALCIUM 8.1* 7.8*  PHOS 5.2* 5.7*    Lipid Panel:     Component Value Date/Time   CHOL 163 01/18/2016 2052   TRIG 100 01/18/2016 2052   HDL 64 01/18/2016 2052   CHOLHDL 2.5 01/18/2016 2052   VLDL 20 01/18/2016 2052   LDLCALC 79 01/18/2016 2052   HgbA1c:  Lab Results  Component Value Date   HGBA1C 7.6* 01/18/2016   Urine Drug Screen: No results found for: LABOPIA, COCAINSCRNUR, LABBENZ, AMPHETMU, THCU, LABBARB    IMAGING  Dg Chest 2 View 01/18/2016   Probable congestive heart failure with slight pulmonary edema and small effusions. Aortic atherosclerosis.   Dg Shoulder  Right 01/18/2016   No acute abnormality of the right shoulder.   Ct Head Wo Contrast 01/18/2016   1. Subacute nonhemorrhagic infarction in the right parietal lobe.  2. Multiple old lacunar infarcts as well as extensive chronic periventricular small vessel disease.  3. Progressive atrophy.     Mr Brain Wo Contrast 01/19/2016   Motion degraded examination. Acute moderate RIGHT middle cerebral artery territory infarct correlating to today's CT abnormality. No hemorrhagic conversion. Moderate to severe chronic small vessel ischemic disease and old suspected bilateral basal ganglia lacunar infarcts.   Dg Knee Complete 4 Views Left 01/18/2016   No acute abnormality.   MRA head without Contrast 09/20/2016 1. No major branch occlusion or significant proximal stenosis. 2. Moderate focal stenosis of a distal right M2 branch vessel. 3. Possible 1.5 mm right superior hypophyseal region ICA aneurysm.    PHYSICAL EXAM Elderly lady not in distress. . Afebrile. Head is nontraumatic. Neck is supple without bruit.    Cardiac exam no murmur or gallop. Lungs are clear to auscultation. Distal pulses are well felt.  Neurological Exam :  Awake alert oriented x 3 normal speech and language. Mild left lower face asymmetry. Tongue midline. No drift. Mild diminished fine finger movements on left. Orbits right over left upper extremity. Mild left grip weak.. Normal sensation .  Normal coordination.     ASSESSMENT/PLAN Ms. [name] [name] is a 82 y.o. female with history of end-stage renal disease, diabetes mellitus, hyperlipidemia, hypertension, anemia of chronic disease, coronary artery disease, and hypertrophic obstructive cardiomyopathy presenting with pulmonary edema and left hemiparesis. She did not receive IV t-PA due to unknown time of onset.  Stroke:  Non-dominant right MCA territory infarct felt to be embolic from an unknown source.   Resultant  left hemiparesis  MRI  Acute moderate RIGHT middle  cerebral artery territory infarct   MRA  Moderate focal stenosis of a distal right M2 branch vessel.  Carotid Doppler 1-39% internal carotid artery stenosis bilaterally. Vertebral arteries are patent with antegrade flow. 2D Echo Left ventricle: The cavity size was normal. Wall thickness was  increased in a pattern of moderate LVH. There was moderate  concentric hypertrophy. Systolic function was hyperdynamic. The   estimated ejection fraction was in the range of 70% to 75LDL 79  HgbA1c7.6  VTE prophylaxis - subcutaneous heparin DIET DYS 3 Room service appropriate?: Yes; Fluid consistency:: Thin  aspirin 81 mg daily prior to admission, now on aspirin 325 mg daily change to Plavix per Dr. Leonie Man  Patient counseled to be compliant with her antithrombotic medications  Ongoing aggressive stroke risk factor management  Therapy recommendations: snf  Disposition: snf  Hypertension  Stable  Permissive hypertension (OK if < 220/120) but gradually normalize in 5-7 days  Hyperlipidemia  Home meds: Lipitor 20 mg daily - increased to 40 mg daily  LDL 79, goal < 70  Now on Lipitor 40 mg daily  Continue statin at discharge  Diabetes  HgbA1c pending, goal < 7.0  Controlled  Other Stroke Risk Factors  Advanced age  Old infarcts on imaging  Family hx stroke (mother)  Coronary artery disease   Other Active Problems  Anemia of chronic disease  End-stage renal disease  Hospital day # 4    I have personally examined this patient, reviewed notes, independently viewed imaging studies, participated in medical decision making and plan of care. I have made any additions or clarifications directly to the above note.  She presented with left hand weakness due to embolic right MCA branch infarct  Continue ongoing stroke w/u. Greater than  50 % time during this 15 minute visit was spent on counselling and coordination of care about stroke risk.Continue   Plavix.F/U as outpatient  in stroke clinic in 2 months  Antony Contras, MD Medical Director [place] Pager: [phone] 01/22/2016 4:49 PM     To contact Stroke Continuity provider, please refer to [email or web address] After hours, contact General Neurology

## 2016-01-22 NOTE — Progress Notes (Signed)
Patient ID: [name] [name], female   DOB: January 11, 1926, 82 y.o.   MRN: [id number] Medicine attending: I examined this patient this morning together with resident physician Dr. Jacques Earthly and I concur with her evaluation and management plan as documented in her progress note today. I detect minimal to no motor deficits of her left upper extremity. There is weakness of both ankles left greater than right. She can flex and extend against gravity but not with any resistance on the left. Some strength on the right. 4/5. Carotid Doppler studies showed no significant compromise of her internal carotid arteries on either side. This completes her stroke evaluation.  Apparently she was already wheelchair-bound prior to this admission. It is hard to believe she was in an assisted living situation. We will move ahead with searching for a skilled nursing bed.

## 2016-01-22 NOTE — Progress Notes (Signed)
[name] [name] to be D/C'd Skilled nursing facility per MD order.  Discussed prescriptions and follow up appointments with the patient. Prescriptions given to patient, medication list explained in detail. Pt verbalized understanding.    Medication List    STOP taking these medications        alendronate 70 MG tablet  Commonly known as:  FOSAMAX     aspirin EC 81 MG tablet     geriatric multivitamins-minerals Elix     Glucosamine-Chondroitin 250-200 MG Caps     lansoprazole 30 MG capsule  Commonly known as:  PREVACID     lanthanum 500 MG chewable tablet  Commonly known as:  FOSRENOL      TAKE these medications        acetaminophen 500 MG tablet  Commonly known as:  TYLENOL  Take 1,000 mg by mouth every 6 (six) hours as needed (pain).     acetaminophen 325 MG tablet  Commonly known as:  TYLENOL  Take 650 mg by mouth every 6 (six) hours as needed for mild pain.     albuterol 108 (90 Base) MCG/ACT inhaler  Commonly known as:  PROVENTIL HFA;VENTOLIN HFA  Inhale 2 puffs into the lungs every 4 (four) hours as needed for wheezing or shortness of breath.     amitriptyline 10 MG tablet  Commonly known as:  ELAVIL  Take 10 mg by mouth at bedtime.     atorvastatin 40 MG tablet  Commonly known as:  LIPITOR  Take 1 tablet (40 mg total) by mouth daily.     b complex-vitamin c-folic acid 0.8 MG Tabs tablet  Take 1 tablet by mouth at bedtime.     cinacalcet 30 MG tablet  Commonly known as:  SENSIPAR  Take 30 mg by mouth daily with supper.     clopidogrel 75 MG tablet  Commonly known as:  PLAVIX  Take 1 tablet (75 mg total) by mouth daily.     docusate sodium 100 MG capsule  Commonly known as:  COLACE  Take 100 mg by mouth daily.     febuxostat 40 MG tablet  Commonly known as:  ULORIC  Take 40 mg by mouth at bedtime.     feeding supplement (GLUCERNA SHAKE) Liqd  Take 237 mLs by mouth 2 (two) times daily between meals.     fexofenadine 180 MG tablet  Commonly known  as:  ALLEGRA  Take 180 mg by mouth daily as needed for allergies or rhinitis.     fish oil-omega-3 fatty acids 1000 MG capsule  Take 1 g by mouth daily.     guaiFENesin 100 MG/5ML liquid  Commonly known as:  ROBITUSSIN  Take 200 mg by mouth every 6 (six) hours as needed for cough.     insulin detemir 100 UNIT/ML injection  Commonly known as:  LEVEMIR  Inject 0.03 mLs (3 Units total) into the skin daily.     insulin regular 100 units/mL injection  Commonly known as:  NOVOLIN R,HUMULIN R  Inject 0-0.12 mLs (0-12 Units total) into the skin 3 (three) times daily before meals. Per sliding scale: 0-150=0 units, 151-200 = 1 units, 201-250 = 2 units, 251-300 = 3 units, 301-350 = 4 units, 351-400 = 5 units, 401-450 = 6 units, > 450 CALL MD.     magnesium hydroxide 400 MG/5ML suspension  Commonly known as:  MILK OF MAGNESIA  Take 30 mLs by mouth daily as needed for mild constipation.     nystatin powder  Generic drug:  nystatin  Apply 1 g topically every 8 (eight) hours as needed (yeastlike areas).     ondansetron 8 MG tablet  Commonly known as:  ZOFRAN  Take 8 mg by mouth 3 (three) times daily as needed for nausea or vomiting.     OxyCODONE HCl (Abuse Deter) 5 MG Taba  Commonly known as:  OXAYDO  Take 5 mg by mouth every 4 (four) hours as needed (pain).     phenol 1.4 % Liqd  Commonly known as:  CHLORASEPTIC  Use as directed 1 spray in the mouth or throat 3 (three) times daily as needed for throat irritation / pain.     polyethylene glycol packet  Commonly known as:  MIRALAX / GLYCOLAX  Take 17 g by mouth at bedtime.     polyvinyl alcohol 1.4 % ophthalmic solution  Commonly known as:  LIQUIFILM TEARS  Place 2 drops into both eyes 4 (four) times daily.     RENVELA 2.4 g Pack  Generic drug:  sevelamer carbonate  Take 2.4 g by mouth 3 (three) times daily with meals.     traMADol 50 MG tablet  Commonly known as:  ULTRAM  Take 1 tablet (50 mg total) by mouth every 6 (six) hours  as needed (pain).     vitamin B-12 1000 MCG tablet  Commonly known as:  CYANOCOBALAMIN  Take 1,000 mcg by mouth daily.        Filed Vitals:   01/21/16 2046 01/22/16 0458  BP: 144/29 156/39  Pulse: 83 89  Temp: 99 F (37.2 C) 98.3 F (36.8 C)  Resp: 18 17    Skin clean, dry and intact without evidence of skin break down, no evidence of skin tears noted. IV catheter discontinued intact. Site without signs and symptoms of complications. Dressing and pressure applied. Pt denies pain at this time. No complaints noted.  An After Visit Summary was printed and given to the patient. Patient escorted via stretcher, and D/C via ambulance.  Retta Mac BSN, RN

## 2016-01-22 NOTE — Clinical Social Work Note (Signed)
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  No (Patient and daughter understand that rehab needed before patient can return home safely) Need for family participation in patient care:  Yes (Comment) (Patient requested that I contact her daughter to discuss ST rehab.)  Care giving concerns:  Patient from Assisted Living ([facility]), however needs more assistance that facility can provide at this time. Both patient and daughter expressed the need for short-term rehab.  Social Worker assessment / plan:  CSW talked initially with patient at the bedside, then with daughter Katheran Awe at the bedside regarding recommendation for ST rehab. Both patient and daughter in agreement. Ms. Karpman reported that she has been to rehab before at [facility] and would not mind returning to this facility. During call with daughter, she expressed the same regarding [facility].  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  Patient/Family's Understanding of and Emotional Response to Diagnosis, Current Treatment, and Prognosis:  Not discussed.  Emotional Assessment Appearance:  Appears stated age Attitude/Demeanor/Rapport:  Other (Appropriate) Affect (typically observed):  Appropriate, Pleasant Orientation:  Oriented to Self, Oriented to Place, Oriented to Situation Alcohol / Substance use:  Never Used Psych involvement (Current and /or in the community):  No (Comment)  Discharge Needs  Concerns to be addressed:  Discharge Planning Concerns Readmission within the last 30 days:  No Current discharge risk:  None Barriers to Discharge:  No Barriers Identified   Sable Feil, LCSW 01/22/2016, 5:04 PM

## 2016-03-27 ENCOUNTER — Encounter: Payer: Self-pay | Admitting: Neurology

## 2016-03-27 ENCOUNTER — Ambulatory Visit (INDEPENDENT_AMBULATORY_CARE_PROVIDER_SITE_OTHER): Payer: Medicare Other | Admitting: Neurology

## 2016-03-27 VITALS — BP 108/62 | HR 89 | Ht 61.0 in

## 2016-03-27 DIAGNOSIS — G8194 Hemiplegia, unspecified affecting left nondominant side: Secondary | ICD-10-CM

## 2016-03-27 DIAGNOSIS — I639 Cerebral infarction, unspecified: Secondary | ICD-10-CM | POA: Diagnosis not present

## 2016-03-27 NOTE — Patient Instructions (Signed)
I had a long d/w patient about her recent stroke, risk for recurrent stroke/TIAs, personally independently reviewed imaging studies and stroke evaluation results and answered questions.unfortunately she has significant underlying medical comorbidities and  deficits from the stroke which have hampered her recovery Continue Plavix  for secondary stroke prevention and maintain strict control of hypertension with blood pressure goal below 130/90, diabetes with hemoglobin A1c goal below 6.5% and lipids with LDL cholesterol goal below 70 mg/dL. I also advised the patient to eat a healthy diet with plenty of whole grains, cereals, fruits and vegetables, exercise regularly and maintain ideal body weight .she was encouraged to do exercises to help improve her left hand and leg weakness. Followup in the future with stroke nurse practitioner in 6 months or call earlier if necessary.

## 2016-03-28 NOTE — Progress Notes (Signed)
[facility] [address] [place]. [place] [address] [phone]       OFFICE FOLLOW-UP NOTE  [name]. [name] [name] Date of Birth:  Dec 09, 1925 Medical Record Number:  [id number]   HPI: [name] [name] is a 51 year Caucasian lady seen today for first office follow-up visit following hospital admission for stroke in May 2017. She is accompanied by her son who provides most of the history and hospital admission chart was also reviewed in detail.[name] [name] is a 81 y.o. female with a history of DM, ESRD who presented with pulmonary edema and left hand weakness. It is unclear when it started. It was noted in the ED that her hand felt limp and a CT showedsubacute infarct.LKW: uncleartpa given?: no, unclear time of onset. The MRI confirmed an acute posterior div MCA infarct. She continues to have left sided weakness. MRA of the brain showed high-grade stenosis of the M2 division of the right middle cerebral artery. Transthoracic echo showed normal ejection fraction. LDL cholesterol was 79 mg percent. Hemoglobin A1c was 7.6. Carotid ultrasound showed no significant extracranial stenosis. Patient was previously on aspirin this was changed to Plavix for second stroke prevention. She was transferred to skilled nursing facility for rehabilitation. Patient has unfortunately not often much relief and improvement and she is still in the wheelchair and previously was able to transfer but now no longer does so. She is unable to walk even with assistance. She is on home oxygen. She is up 10 some improvement in the left-sided strength but still has weakness. She still has significant diminished sensation on the left side.  ROS:   14 system review of systems is positive for appetite change, shortness of breath, incontinence of bladder, joint pain and swelling, incontinence of bowels, snoring, skin wounds, weakness, difficulty walking and all the systems negative  PMH:  Past Medical History  Diagnosis  Date  . Type 2 diabetes mellitus (HCC)     mellitus? x25 years  . Essential hypertension   . Hypercholesterolemia   . ESRD (end stage renal disease) ([place])   . Anemia of chronic disease   . CAD (coronary artery disease)     Nonobstructive   . GERD (gastroesophageal reflux disease)   . HOCM (hypertrophic obstructive cardiomyopathy) ([place])   . Hypoglycemia 09/2015  . Stroke [facility])     Social History:  Social History   Social History  . Marital Status: Widowed    Spouse Name: N/A  . Number of Children: N/A  . Years of Education: N/A   Occupational History  . Not on file.   Social History Main Topics  . Smoking status: Never Smoker   . Smokeless tobacco: Never Used     Comment: no smoking   . Alcohol Use: No  . Drug Use: No  . Sexual Activity: Not Currently    Birth Control/ Protection: Post-menopausal   Other Topics Concern  . Not on file   Social History Narrative   Widowed. Retired and lives at assisted living      Medications:   Current Outpatient Prescriptions on File Prior to Visit  Medication Sig Dispense Refill  . acetaminophen (TYLENOL) 325 MG tablet Take 650 mg by mouth every 6 (six) hours as needed for mild pain.    Marland Kitchen acetaminophen (TYLENOL) 500 MG tablet Take 1,000 mg by mouth every 6 (six) hours as needed (pain).    Marland Kitchen albuterol (PROVENTIL HFA;VENTOLIN HFA) 108 (90 Base) MCG/ACT inhaler Inhale 2 puffs into the lungs  every 4 (four) hours as needed for wheezing or shortness of breath.    Marland Kitchen amitriptyline (ELAVIL) 10 MG tablet Take 10 mg by mouth at bedtime.     Marland Kitchen atorvastatin (LIPITOR) 40 MG tablet Take 1 tablet (40 mg total) by mouth daily.    Marland Kitchen b complex-vitamin c-folic acid (NEPHRO-VITE) 0.8 MG TABS tablet Take 1 tablet by mouth at bedtime.    . cinacalcet (SENSIPAR) 30 MG tablet Take 30 mg by mouth daily with supper.    . clopidogrel (PLAVIX) 75 MG tablet Take 1 tablet (75 mg total) by mouth daily.    Marland Kitchen docusate sodium (COLACE) 100 MG capsule Take 100 mg by  mouth daily.    . febuxostat (ULORIC) 40 MG tablet Take 40 mg by mouth at bedtime.    . feeding supplement, GLUCERNA SHAKE, (GLUCERNA SHAKE) LIQD Take 237 mLs by mouth 2 (two) times daily between meals. 30 Can 0  . fexofenadine (ALLEGRA) 180 MG tablet Take 180 mg by mouth daily as needed for allergies or rhinitis.    . fish oil-omega-3 fatty acids 1000 MG capsule Take 1 g by mouth daily.     Marland Kitchen guaiFENesin (ROBITUSSIN) 100 MG/5ML liquid Take 200 mg by mouth every 6 (six) hours as needed for cough.    . insulin detemir (LEVEMIR) 100 UNIT/ML injection Inject 0.03 mLs (3 Units total) into the skin daily. 10 mL 11  . insulin regular (NOVOLIN R,HUMULIN R) 100 units/mL injection Inject 0-0.12 mLs (0-12 Units total) into the skin 3 (three) times daily before meals. Per sliding scale: 0-150=0 units, 151-200 = 1 units, 201-250 = 2 units, 251-300 = 3 units, 301-350 = 4 units, 351-400 = 5 units, 401-450 = 6 units, > 450 CALL MD. 10 mL 11  . magnesium hydroxide (MILK OF MAGNESIA) 400 MG/5ML suspension Take 30 mLs by mouth daily as needed for mild constipation.    Marland Kitchen nystatin (MYCOSTATIN/NYSTOP) 100000 UNIT/GM POWD Apply 1 g topically every 8 (eight) hours as needed (yeastlike areas).     . ondansetron (ZOFRAN) 8 MG tablet Take 8 mg by mouth 3 (three) times daily as needed for nausea or vomiting.    . OxyCODONE HCl, Abuse Deter, (OXAYDO) 5 MG TABA Take 5 mg by mouth every 4 (four) hours as needed (pain). 10 tablet 0  . phenol (CHLORASEPTIC) 1.4 % LIQD Use as directed 1 spray in the mouth or throat 3 (three) times daily as needed for throat irritation / pain.    . polyethylene glycol (MIRALAX / GLYCOLAX) packet Take 17 g by mouth at bedtime.    . polyvinyl alcohol (LIQUIFILM TEARS) 1.4 % ophthalmic solution Place 2 drops into both eyes 4 (four) times daily.    . sevelamer carbonate (RENVELA) 2.4 g PACK Take 2.4 g by mouth 3 (three) times daily with meals.    . traMADol (ULTRAM) 50 MG tablet Take 1 tablet (50 mg  total) by mouth every 6 (six) hours as needed (pain). 30 tablet 0  . vitamin B-12 (CYANOCOBALAMIN) 1000 MCG tablet Take 1,000 mcg by mouth daily.     No current facility-administered medications on file prior to visit.    Allergies:  No Known Allergies  Physical Exam General: Obese elderly Caucasian lady seated, in no evident distress Head: head normocephalic and atraumatic.  Neck: supple with no carotid or supraclavicular bruits Cardiovascular: regular rate and rhythm, no murmurs Musculoskeletal: no deformity Skin:  no rash/petichiae Vascular:  Normal pulses all extremities Filed Vitals:   03/27/16  1514  BP: 108/62  Pulse: 89   Neurologic Exam Mental Status: Awake and fully alert. Oriented to place and time. Recent and remote memory intact. Attention span, concentration and fund of knowledge Diminished. Mood and affect appropriate.  Cranial Nerves: Fundoscopic exam reveals sharp disc margins. Pupils equal, briskly reactive to light. Extraocular movements full without nystagmus. Visual fields full to confrontation. Hearing intact. Facial sensation intact. Face, tongue, palate moves normally and symmetrically.  Motor: Normal bulk and tone. Normal strength in all tested extremity muscles Except mild weakness of her left grip, intrinsic hand muscles, left hip flexors and ankle dorsiflexors.. Sensory.: Diminished touch ,pinprick sensation on the left body but intact .position and vibratory sensation.  Coordination: Rapid alternating movements normal in all extremities. Finger-to-nose and heel-to-shin performed accurately bilaterally. Gait and Station: Unable to arise from wheelchair without difficulty. Gait deferred  Reflexes: 1+ and symmetric. Toes downgoing.   NIHSS  2 Modified Rankin  4   ASSESSMENT: 66 year Caucasian lady with a right posterior division MCA branch infarct in May 2017 secondary to right distal M2 stenosis. Multiple vascular risk factors of diabetes, hypertension,  hyperlipidemia, obesity    PLAN: I had a long d/w patient about her recent stroke, risk for recurrent stroke/TIAs, personally independently reviewed imaging studies and stroke evaluation results and answered questions.unfortunately she has significant underlying medical comorbidities and  deficits from the stroke which have hampered her recovery Continue Plavix  for secondary stroke prevention and maintain strict control of hypertension with blood pressure goal below 130/90, diabetes with hemoglobin A1c goal below 6.5% and lipids with LDL cholesterol goal below 70 mg/dL. I also advised the patient to eat a healthy diet with plenty of whole grains, cereals, fruits and vegetables, exercise regularly and maintain ideal body weight .she was encouraged to do exercises to help improve her left hand and leg weakness. Followup in the future with stroke nurse practitioner in 6 months or call earlier if necessary Greater than 50% of time during this 25 minute visit was spent on counseling,explanation of diagnosis, planning of further management, discussion with patient and family and coordination of care [name] Contras, MD  [facility] [address] [place], [place] [address]  Phone [phone] Fax [phone]  Note: This document was prepared with digital dictation and possible smart phrase technology. Any transcriptional errors that result from this process are unintentional

## 2016-05-23 DEATH — deceased

## 2016-09-30 ENCOUNTER — Ambulatory Visit: Payer: Medicare Other | Admitting: Nurse Practitioner
